# Patient Record
Sex: Female | Born: 1985
Health system: Southern US, Community
[De-identification: ages and names within clinical notes are randomized; demographics above are authoritative.]

## PROBLEM LIST (undated history)

## (undated) ENCOUNTER — Inpatient Hospital Stay: Payer: Self-pay

## (undated) DIAGNOSIS — G932 Benign intracranial hypertension: Secondary | ICD-10-CM

## (undated) DIAGNOSIS — S82409A Unspecified fracture of shaft of unspecified fibula, initial encounter for closed fracture: Secondary | ICD-10-CM

## (undated) DIAGNOSIS — R519 Headache, unspecified: Secondary | ICD-10-CM

## (undated) DIAGNOSIS — F32A Depression, unspecified: Secondary | ICD-10-CM

## (undated) DIAGNOSIS — Z803 Family history of malignant neoplasm of breast: Secondary | ICD-10-CM

## (undated) DIAGNOSIS — F329 Major depressive disorder, single episode, unspecified: Secondary | ICD-10-CM

## (undated) DIAGNOSIS — Z9889 Other specified postprocedural states: Secondary | ICD-10-CM

## (undated) DIAGNOSIS — Z1371 Encounter for nonprocreative screening for genetic disease carrier status: Secondary | ICD-10-CM

## (undated) DIAGNOSIS — R112 Nausea with vomiting, unspecified: Secondary | ICD-10-CM

## (undated) DIAGNOSIS — K219 Gastro-esophageal reflux disease without esophagitis: Secondary | ICD-10-CM

## (undated) DIAGNOSIS — N200 Calculus of kidney: Secondary | ICD-10-CM

## (undated) DIAGNOSIS — R51 Headache: Secondary | ICD-10-CM

## (undated) DIAGNOSIS — Z87442 Personal history of urinary calculi: Secondary | ICD-10-CM

## (undated) DIAGNOSIS — I10 Essential (primary) hypertension: Secondary | ICD-10-CM

## (undated) DIAGNOSIS — F419 Anxiety disorder, unspecified: Secondary | ICD-10-CM

## (undated) DIAGNOSIS — Z8489 Family history of other specified conditions: Secondary | ICD-10-CM

## (undated) DIAGNOSIS — M21372 Foot drop, left foot: Secondary | ICD-10-CM

## (undated) HISTORY — PX: ABDOMINAL HYSTERECTOMY: SHX81

## (undated) HISTORY — DX: Family history of malignant neoplasm of breast: Z80.3

## (undated) HISTORY — DX: Depression, unspecified: F32.A

## (undated) HISTORY — DX: Calculus of kidney: N20.0

## (undated) HISTORY — DX: Major depressive disorder, single episode, unspecified: F32.9

## (undated) HISTORY — DX: Unspecified fracture of shaft of unspecified fibula, initial encounter for closed fracture: S82.409A

## (undated) HISTORY — DX: Foot drop, left foot: M21.372

## (undated) HISTORY — PX: WISDOM TOOTH EXTRACTION: SHX21

## (undated) HISTORY — DX: Encounter for nonprocreative screening for genetic disease carrier status: Z13.71

## (undated) HISTORY — DX: Gastro-esophageal reflux disease without esophagitis: K21.9

---

## 2005-08-21 ENCOUNTER — Ambulatory Visit: Payer: Self-pay | Admitting: Neurology

## 2005-09-04 ENCOUNTER — Ambulatory Visit: Payer: Self-pay | Admitting: Neurology

## 2005-10-02 ENCOUNTER — Ambulatory Visit: Payer: Self-pay | Admitting: Radiology

## 2007-01-20 ENCOUNTER — Emergency Department: Payer: Self-pay | Admitting: Emergency Medicine

## 2007-12-17 DIAGNOSIS — G932 Benign intracranial hypertension: Secondary | ICD-10-CM

## 2007-12-17 HISTORY — DX: Benign intracranial hypertension: G93.2

## 2008-04-03 ENCOUNTER — Observation Stay: Payer: Self-pay | Admitting: Obstetrics & Gynecology

## 2008-05-31 ENCOUNTER — Inpatient Hospital Stay: Payer: Self-pay | Admitting: Obstetrics and Gynecology

## 2008-10-03 ENCOUNTER — Emergency Department: Payer: Self-pay | Admitting: Emergency Medicine

## 2008-11-03 ENCOUNTER — Ambulatory Visit: Payer: Self-pay | Admitting: Radiology

## 2008-11-03 ENCOUNTER — Ambulatory Visit: Payer: Self-pay | Admitting: Specialist

## 2013-01-23 ENCOUNTER — Ambulatory Visit: Payer: Self-pay | Admitting: General Practice

## 2014-02-24 ENCOUNTER — Ambulatory Visit: Payer: Self-pay | Admitting: General Practice

## 2014-09-20 ENCOUNTER — Other Ambulatory Visit (HOSPITAL_COMMUNITY): Payer: Self-pay | Admitting: Neurosurgery

## 2014-09-20 ENCOUNTER — Encounter (HOSPITAL_COMMUNITY): Payer: Self-pay | Admitting: Pharmacy Technician

## 2014-09-21 ENCOUNTER — Encounter (HOSPITAL_COMMUNITY): Payer: Self-pay | Admitting: *Deleted

## 2014-09-21 MED ORDER — CEFAZOLIN SODIUM-DEXTROSE 2-3 GM-% IV SOLR
2.0000 g | INTRAVENOUS | Status: AC
  Administered 2014-09-22: 2 g via INTRAVENOUS
  Filled 2014-09-21: qty 50

## 2014-09-22 ENCOUNTER — Observation Stay (HOSPITAL_COMMUNITY)
Admission: RE | Admit: 2014-09-22 | Discharge: 2014-09-22 | Disposition: A | Discharge status: Home / Self Care | Payer: No Typology Code available for payment source | Source: Ambulatory Visit | Attending: Neurosurgery | Admitting: Neurosurgery

## 2014-09-22 ENCOUNTER — Ambulatory Visit (HOSPITAL_COMMUNITY): Payer: No Typology Code available for payment source | Admitting: Certified Registered"

## 2014-09-22 ENCOUNTER — Encounter (HOSPITAL_COMMUNITY): Payer: No Typology Code available for payment source | Admitting: Certified Registered"

## 2014-09-22 ENCOUNTER — Encounter (HOSPITAL_COMMUNITY): Payer: Self-pay | Admitting: Certified Registered"

## 2014-09-22 ENCOUNTER — Ambulatory Visit (HOSPITAL_COMMUNITY): Payer: No Typology Code available for payment source

## 2014-09-22 ENCOUNTER — Encounter (HOSPITAL_COMMUNITY)
Admission: RE | Disposition: A | Discharge status: Home / Self Care | Payer: Self-pay | Source: Ambulatory Visit | Attending: Neurosurgery

## 2014-09-22 DIAGNOSIS — F17210 Nicotine dependence, cigarettes, uncomplicated: Secondary | ICD-10-CM | POA: Diagnosis not present

## 2014-09-22 DIAGNOSIS — I10 Essential (primary) hypertension: Secondary | ICD-10-CM | POA: Insufficient documentation

## 2014-09-22 DIAGNOSIS — M5126 Other intervertebral disc displacement, lumbar region: Secondary | ICD-10-CM | POA: Diagnosis present

## 2014-09-22 DIAGNOSIS — Z79899 Other long term (current) drug therapy: Secondary | ICD-10-CM | POA: Diagnosis not present

## 2014-09-22 DIAGNOSIS — F419 Anxiety disorder, unspecified: Secondary | ICD-10-CM | POA: Diagnosis not present

## 2014-09-22 DIAGNOSIS — M21372 Foot drop, left foot: Secondary | ICD-10-CM | POA: Insufficient documentation

## 2014-09-22 HISTORY — DX: Essential (primary) hypertension: I10

## 2014-09-22 HISTORY — DX: Headache, unspecified: R51.9

## 2014-09-22 HISTORY — PX: LUMBAR LAMINECTOMY/DECOMPRESSION MICRODISCECTOMY: SHX5026

## 2014-09-22 HISTORY — DX: Other intervertebral disc displacement, lumbar region: M51.26

## 2014-09-22 HISTORY — DX: Anxiety disorder, unspecified: F41.9

## 2014-09-22 HISTORY — DX: Family history of other specified conditions: Z84.89

## 2014-09-22 HISTORY — DX: Headache: R51

## 2014-09-22 LAB — HCG, SERUM, QUALITATIVE: Preg, Serum: NEGATIVE

## 2014-09-22 LAB — CBC
HCT: 40.9 % (ref 36.0–46.0)
Hemoglobin: 14.1 g/dL (ref 12.0–15.0)
MCH: 29.4 pg (ref 26.0–34.0)
MCHC: 34.5 g/dL (ref 30.0–36.0)
MCV: 85.2 fL (ref 78.0–100.0)
Platelets: 342 10*3/uL (ref 150–400)
RBC: 4.8 MIL/uL (ref 3.87–5.11)
RDW: 12.3 % (ref 11.5–15.5)
WBC: 9.1 10*3/uL (ref 4.0–10.5)

## 2014-09-22 LAB — BASIC METABOLIC PANEL
Anion gap: 14 (ref 5–15)
BUN: 8 mg/dL (ref 6–23)
CO2: 24 mEq/L (ref 19–32)
Calcium: 9.1 mg/dL (ref 8.4–10.5)
Chloride: 99 mEq/L (ref 96–112)
Creatinine, Ser: 0.85 mg/dL (ref 0.50–1.10)
GFR calc Af Amer: >90 mL/min (ref >90)
GFR calc non Af Amer: >90 mL/min (ref >90)
Glucose, Bld: 112 mg/dL — ABNORMAL HIGH (ref 70–99)
Potassium: 4.2 mEq/L (ref 3.7–5.3)
Sodium: 137 mEq/L (ref 137–147)

## 2014-09-22 LAB — SURGICAL PCR SCREEN
MRSA, PCR: NEGATIVE
Staphylococcus aureus: NEGATIVE

## 2014-09-22 SURGERY — LUMBAR LAMINECTOMY/DECOMPRESSION MICRODISCECTOMY 1 LEVEL
Anesthesia: General | Site: Back | Laterality: Left

## 2014-09-22 MED ORDER — METHYLPREDNISOLONE ACETATE 80 MG/ML IJ SUSP
INTRAMUSCULAR | Status: DC | PRN
  Administered 2014-09-22: 80 mg

## 2014-09-22 MED ORDER — GLYCOPYRROLATE 0.2 MG/ML IJ SOLN
INTRAMUSCULAR | Status: DC | PRN
  Administered 2014-09-22: .8 mg via INTRAVENOUS

## 2014-09-22 MED ORDER — ROCURONIUM BROMIDE 100 MG/10ML IV SOLN
INTRAVENOUS | Status: DC | PRN
  Administered 2014-09-22: 50 mg via INTRAVENOUS

## 2014-09-22 MED ORDER — OXYCODONE-ACETAMINOPHEN 10-325 MG PO TABS: 1.0000 | ORAL_TABLET | ORAL | Status: DC | PRN

## 2014-09-22 MED ORDER — MIDAZOLAM HCL 2 MG/2ML IJ SOLN
INTRAMUSCULAR | Status: AC
  Filled 2014-09-22: qty 2

## 2014-09-22 MED ORDER — MUPIROCIN 2 % EX OINT
TOPICAL_OINTMENT | CUTANEOUS | Status: AC
  Administered 2014-09-22 (×2)
  Filled 2014-09-22: qty 22

## 2014-09-22 MED ORDER — METOCLOPRAMIDE HCL 5 MG/ML IJ SOLN
10.0000 mg | Freq: Once | INTRAMUSCULAR | Status: AC | PRN
  Administered 2014-09-22: 10 mg via INTRAVENOUS

## 2014-09-22 MED ORDER — PROPOFOL 10 MG/ML IV BOLUS
INTRAVENOUS | Status: AC
  Filled 2014-09-22: qty 20

## 2014-09-22 MED ORDER — DIAZEPAM 5 MG PO TABS: 5.0000 mg | ORAL_TABLET | Freq: Four times a day (QID) | ORAL | Status: DC | PRN

## 2014-09-22 MED ORDER — 0.9 % SODIUM CHLORIDE (POUR BTL) OPTIME
TOPICAL | Status: DC | PRN
  Administered 2014-09-22: 1000 mL

## 2014-09-22 MED ORDER — ONDANSETRON HCL 4 MG/2ML IJ SOLN
INTRAMUSCULAR | Status: AC
  Administered 2014-09-22: 4 mg
  Filled 2014-09-22: qty 2

## 2014-09-22 MED ORDER — MIDAZOLAM BOLUS VIA INFUSION
1.0000 mg | Freq: Once | INTRAVENOUS | Status: AC
  Administered 2014-09-22: 1 mg via INTRAVENOUS

## 2014-09-22 MED ORDER — THROMBIN 5000 UNITS EX SOLR
OROMUCOSAL | Status: DC | PRN
  Administered 2014-09-22: 16:00:00 via TOPICAL

## 2014-09-22 MED ORDER — DEXAMETHASONE SODIUM PHOSPHATE 4 MG/ML IJ SOLN
INTRAMUSCULAR | Status: DC | PRN
  Administered 2014-09-22: 4 mg via INTRAVENOUS

## 2014-09-22 MED ORDER — THROMBIN 5000 UNITS EX SOLR
CUTANEOUS | Status: DC | PRN
  Administered 2014-09-22 (×2): 5000 [IU] via TOPICAL

## 2014-09-22 MED ORDER — ONDANSETRON HCL 4 MG/2ML IJ SOLN: 4.0000 mg | Freq: Once | INTRAMUSCULAR | Status: DC | PRN

## 2014-09-22 MED ORDER — MIDAZOLAM HCL 5 MG/5ML IJ SOLN
INTRAMUSCULAR | Status: DC | PRN
  Administered 2014-09-22: 2 mg via INTRAVENOUS

## 2014-09-22 MED ORDER — LIDOCAINE HCL (CARDIAC) 20 MG/ML IV SOLN
INTRAVENOUS | Status: DC | PRN
  Administered 2014-09-22: 30 mg via INTRAVENOUS

## 2014-09-22 MED ORDER — OXYCODONE HCL 5 MG/5ML PO SOLN: 5.0000 mg | Freq: Once | ORAL | Status: DC | PRN

## 2014-09-22 MED ORDER — HEMOSTATIC AGENTS (NO CHARGE) OPTIME
TOPICAL | Status: DC | PRN
  Administered 2014-09-22: 1 via TOPICAL

## 2014-09-22 MED ORDER — ACETAMINOPHEN 10 MG/ML IV SOLN
INTRAVENOUS | Status: AC
  Administered 2014-09-22: 1000 mg via INTRAVENOUS
  Filled 2014-09-22: qty 100

## 2014-09-22 MED ORDER — OXYCODONE HCL 5 MG PO TABS: 5.0000 mg | ORAL_TABLET | Freq: Once | ORAL | Status: DC | PRN

## 2014-09-22 MED ORDER — ONDANSETRON HCL 4 MG/2ML IJ SOLN
INTRAMUSCULAR | Status: DC | PRN
  Administered 2014-09-22: 4 mg via INTRAVENOUS

## 2014-09-22 MED ORDER — SODIUM CHLORIDE 0.9 % IV SOLN: INTRAVENOUS | Status: DC

## 2014-09-22 MED ORDER — SODIUM CHLORIDE 0.9 % IR SOLN
Status: DC | PRN
  Administered 2014-09-22: 16:00:00

## 2014-09-22 MED ORDER — ROCURONIUM BROMIDE 50 MG/5ML IV SOLN
INTRAVENOUS | Status: AC
  Filled 2014-09-22: qty 1

## 2014-09-22 MED ORDER — LIDOCAINE-EPINEPHRINE 1 %-1:100000 IJ SOLN
INTRAMUSCULAR | Status: DC | PRN
  Administered 2014-09-22: 4.5 mL

## 2014-09-22 MED ORDER — LIDOCAINE HCL (CARDIAC) 20 MG/ML IV SOLN
INTRAVENOUS | Status: AC
  Filled 2014-09-22: qty 5

## 2014-09-22 MED ORDER — ONDANSETRON HCL 4 MG/2ML IJ SOLN
4.0000 mg | INTRAMUSCULAR | Status: DC | PRN
  Filled 2014-09-22: qty 2

## 2014-09-22 MED ORDER — HYDROMORPHONE HCL 1 MG/ML IJ SOLN
INTRAMUSCULAR | Status: AC
  Filled 2014-09-22: qty 1

## 2014-09-22 MED ORDER — SUFENTANIL CITRATE 50 MCG/ML IV SOLN
INTRAVENOUS | Status: AC
  Filled 2014-09-22: qty 1

## 2014-09-22 MED ORDER — SUFENTANIL CITRATE 50 MCG/ML IV SOLN
INTRAVENOUS | Status: DC | PRN
  Administered 2014-09-22: 10 ug via INTRAVENOUS
  Administered 2014-09-22: 20 ug via INTRAVENOUS

## 2014-09-22 MED ORDER — HYDROMORPHONE HCL 1 MG/ML IJ SOLN
0.2500 mg | INTRAMUSCULAR | Status: DC | PRN
  Administered 2014-09-22: 0.5 mg via INTRAVENOUS

## 2014-09-22 MED ORDER — BUPIVACAINE HCL (PF) 0.5 % IJ SOLN
INTRAMUSCULAR | Status: DC | PRN
  Administered 2014-09-22: 4.5 mL

## 2014-09-22 MED ORDER — METOCLOPRAMIDE HCL 5 MG/ML IJ SOLN
INTRAMUSCULAR | Status: AC
  Filled 2014-09-22: qty 2

## 2014-09-22 MED ORDER — PROPOFOL 10 MG/ML IV BOLUS
INTRAVENOUS | Status: DC | PRN
  Administered 2014-09-22: 190 mg via INTRAVENOUS

## 2014-09-22 MED ORDER — NEOSTIGMINE METHYLSULFATE 10 MG/10ML IV SOLN
INTRAVENOUS | Status: DC | PRN
  Administered 2014-09-22: 5 mg via INTRAVENOUS

## 2014-09-22 MED ORDER — LACTATED RINGERS IV SOLN
INTRAVENOUS | Status: DC
  Administered 2014-09-22 (×2): via INTRAVENOUS

## 2014-09-22 SURGICAL SUPPLY — 63 items
BAG DECANTER FOR FLEXI CONT (MISCELLANEOUS) ×3 IMPLANT
BENZOIN TINCTURE PRP APPL 2/3 (GAUZE/BANDAGES/DRESSINGS) IMPLANT
BLADE CLIPPER SURG (BLADE) IMPLANT
BLADE SURG 11 STRL SS (BLADE) ×3 IMPLANT
BUR MATCHSTICK NEURO 3.0 LAGG (BURR) ×3 IMPLANT
CANISTER SUCT 3000ML (MISCELLANEOUS) ×3 IMPLANT
CLOSURE WOUND 1/2 X4 (GAUZE/BANDAGES/DRESSINGS)
CONT SPEC 4OZ CLIKSEAL STRL BL (MISCELLANEOUS) ×3 IMPLANT
DECANTER SPIKE VIAL GLASS SM (MISCELLANEOUS) ×3 IMPLANT
DERMABOND ADHESIVE PROPEN (GAUZE/BANDAGES/DRESSINGS) ×2
DERMABOND ADVANCED (GAUZE/BANDAGES/DRESSINGS)
DERMABOND ADVANCED .7 DNX12 (GAUZE/BANDAGES/DRESSINGS) IMPLANT
DERMABOND ADVANCED .7 DNX6 (GAUZE/BANDAGES/DRESSINGS) ×1 IMPLANT
DRAPE LAPAROTOMY 100X72X124 (DRAPES) ×3 IMPLANT
DRAPE MICROSCOPE LEICA (MISCELLANEOUS) ×3 IMPLANT
DRAPE POUCH INSTRU U-SHP 10X18 (DRAPES) ×3 IMPLANT
DRAPE SURG 17X23 STRL (DRAPES) ×3 IMPLANT
DRSG OPSITE POSTOP 3X4 (GAUZE/BANDAGES/DRESSINGS) ×3 IMPLANT
DURAPREP 26ML APPLICATOR (WOUND CARE) ×3 IMPLANT
ELECT REM PT RETURN 9FT ADLT (ELECTROSURGICAL) ×3
ELECTRODE REM PT RTRN 9FT ADLT (ELECTROSURGICAL) ×1 IMPLANT
GAUZE SPONGE 4X4 12PLY STRL (GAUZE/BANDAGES/DRESSINGS) IMPLANT
GAUZE SPONGE 4X4 16PLY XRAY LF (GAUZE/BANDAGES/DRESSINGS) IMPLANT
GLOVE BIOGEL PI IND STRL 7.5 (GLOVE) ×1 IMPLANT
GLOVE BIOGEL PI IND STRL 8 (GLOVE) ×2 IMPLANT
GLOVE BIOGEL PI IND STRL 8.5 (GLOVE) ×1 IMPLANT
GLOVE BIOGEL PI INDICATOR 7.5 (GLOVE) ×2
GLOVE BIOGEL PI INDICATOR 8 (GLOVE) ×4
GLOVE BIOGEL PI INDICATOR 8.5 (GLOVE) ×2
GLOVE ECLIPSE 7.0 STRL STRAW (GLOVE) ×3 IMPLANT
GLOVE ECLIPSE 7.5 STRL STRAW (GLOVE) ×6 IMPLANT
GLOVE ECLIPSE 8.5 STRL (GLOVE) ×3 IMPLANT
GLOVE EXAM NITRILE LRG STRL (GLOVE) IMPLANT
GLOVE EXAM NITRILE MD LF STRL (GLOVE) IMPLANT
GLOVE EXAM NITRILE XL STR (GLOVE) IMPLANT
GLOVE EXAM NITRILE XS STR PU (GLOVE) IMPLANT
GOWN STRL REUS W/ TWL LRG LVL3 (GOWN DISPOSABLE) ×2 IMPLANT
GOWN STRL REUS W/ TWL XL LVL3 (GOWN DISPOSABLE) IMPLANT
GOWN STRL REUS W/TWL 2XL LVL3 (GOWN DISPOSABLE) ×6 IMPLANT
GOWN STRL REUS W/TWL LRG LVL3 (GOWN DISPOSABLE) ×4
GOWN STRL REUS W/TWL XL LVL3 (GOWN DISPOSABLE)
HEMOSTAT POWDER KIT SURGIFOAM (HEMOSTASIS) ×3 IMPLANT
KIT BASIN OR (CUSTOM PROCEDURE TRAY) ×3 IMPLANT
KIT ROOM TURNOVER OR (KITS) ×3 IMPLANT
NEEDLE HYPO 18GX1.5 BLUNT FILL (NEEDLE) ×3 IMPLANT
NEEDLE HYPO 25X1 1.5 SAFETY (NEEDLE) ×3 IMPLANT
NEEDLE SPNL 18GX3.5 QUINCKE PK (NEEDLE) ×3 IMPLANT
NS IRRIG 1000ML POUR BTL (IV SOLUTION) ×3 IMPLANT
PACK LAMINECTOMY NEURO (CUSTOM PROCEDURE TRAY) ×3 IMPLANT
PAD ARMBOARD 7.5X6 YLW CONV (MISCELLANEOUS) ×9 IMPLANT
RUBBERBAND STERILE (MISCELLANEOUS) ×6 IMPLANT
SPONGE LAP 4X18 X RAY DECT (DISPOSABLE) IMPLANT
SPONGE SURGIFOAM ABS GEL SZ50 (HEMOSTASIS) ×3 IMPLANT
STRIP CLOSURE SKIN 1/2X4 (GAUZE/BANDAGES/DRESSINGS) IMPLANT
SUT VIC AB 0 CT1 18XCR BRD8 (SUTURE) ×1 IMPLANT
SUT VIC AB 0 CT1 8-18 (SUTURE) ×2
SUT VIC AB 2-0 CT1 18 (SUTURE) IMPLANT
SUT VICRYL 3-0 RB1 18 ABS (SUTURE) ×3 IMPLANT
SYR 20ML ECCENTRIC (SYRINGE) ×3 IMPLANT
SYR 3ML LL SCALE MARK (SYRINGE) ×3 IMPLANT
TOWEL OR 17X24 6PK STRL BLUE (TOWEL DISPOSABLE) ×3 IMPLANT
TOWEL OR 17X26 10 PK STRL BLUE (TOWEL DISPOSABLE) ×3 IMPLANT
WATER STERILE IRR 1000ML POUR (IV SOLUTION) ×3 IMPLANT

## 2014-09-22 NOTE — Op Note (Signed)
PREOP DIAGNOSIS: Lumbar disc herniation, left L4-5  POSTOP DIAGNOSIS: Same  PROCEDURE: 1. Left L4-5 laminotomy and microdiscectomy for decompression of nerve root 2. Use of operating microscope  SURGEON: Dr. Consuella Lose, MD  ASSISTANT: Dr. Kristeen Miss, MD  ANESTHESIA: General Endotracheal  EBL: 50cc  SPECIMENS: None  DRAINS: None  COMPLICATIONS: None immediate  CONDITION: Hemodynamically stable to PACU  HISTORY: Debbie Townsend is a 28 y.o. female initially seen in the outpatient clinic with a left L5 radiculopathy and a large L45 disc herniation. After she failed conservative management, she elected to proceed with surgical decompression. A few days prior to surgery she developed a foot drop. The risks and benefits of surgery were explained in detail. After all questions were answered, informed consent was obtained.  PROCEDURE IN DETAIL: After informed consent was obtained and witnessed, the patient was brought to the operating room. After induction of general anesthesia, the patient was positioned on the operative table in the prone position with all pressure points meticulously padded. The skin of the low back was then prepped and draped in the usual sterile fashion.  Under Xray, the correct level was identified and marked out on the skin, and after timeout was conducted, the skin was infiltrated with local anesthetic. Skin incision was then made sharply and Bovie electrocautery was used to dissect the subcutaneous tissue until the lumbodorsal fascia was identified. The fascia was then incised using Bovie electrocautery and the lamina at the left L4 and L5 levels was identified and dissection was carried out in the subperiosteal plane. Self-retaining retractor was then placed, and intraoperative x-ray was taken to confirm we were at the correct level, L4-5.  Using a high-speed drill, laminotomy was completed with a partial medial facetectomy. The ligamentum flavum was then  identified and removed and the lateral edge of the thecal sac was identified. This was then traced down to identify the traversing nerve root. Dissection was then carried out superior and lateral to the nerve root to identify the disc herniation. There was a fairly large disc herniation compressing the lateral edge of the thecal sac and the left L5 root at its entrance into the foramen. The posterior annulus was incised and using a combination of dissectors, curettes, and rongeurs, the herniated disc fragments were removed. The decompression of the nerve root was confirmed using a dissector.  Hemostasis was then secured using a combination of morcellized Gelfoam and thrombin and bipolar electrocautery. The wound is irrigated with copious amounts of antibiotic saline irrigation. The nerve root was then covered with a long-acting steroid solution. Self-retaining retractor was then removed, and the wound is closed in layers using a combination of interrupted 0 Vicryl and 3-0 Vicryl stitches. The skin was closed using standard skin glue.  At the end of the case all sponge, needle, and instrument counts were correct. The patient was then transferred to the stretcher and taken to the postanesthesia care unit in stable hemodynamic condition.

## 2014-09-22 NOTE — H&P (Addendum)
CC:  Left foot drop  HPI: Debbie Townsend is a 28 year old woman seen for initial consultation in the office. She comes in complaining of a approximately six-month history of primarily left leg pain. She says the pain began without any inciting events. She describes it as a burning-type pain which she experiences in the left buttock, the back of her left thigh, and the outside of her left calf. She gets pain in these 3 different locations although not always at the same time. She denies any right leg symptoms or bladder changes. She does say that she feels like her left foot is a little bit different when she is walking, although she does continue to ambulate fairly normally. She has been to a chiropractor for a chronic history of back problems, however she has not been getting any significant relief from the manipulations for this left leg pain. The only therapy she has tried so far is over-the-counter Motrin which provides spotty intermittent relief.  She did not wish to undergo surgical decompression and was referred for epidural steroid injections. She has undergone 3 of these injections, and states that after the first when she did get fairly significant left-sided pain relief, however the second 2 did not provide any significant relief. The pain is progressively worsened, and she feels like she is unable to work at this point. She continues to have severe left-sided leg pain in the back of her leg and the lateral aspect of her left calf.  She was initially scheduled for a left-sided L4-5 microdiscectomy 2 days ago. When she was seen in the preoperative area at the outpatient surgical Center, she said that over the past week and approximately 3-4 days prior she developed a complete left-sided footdrop. She said she was dragging her foot, and she was unable to pick her foot up off the ground, although she could push her toes down. She also said that at the same time, the pain in her leg got significantly  better. In addition, several days before the week and started, she was feeling very constipated and ended up getting herself multiple enemas and taking several suppositories. When she was seen in the preop area, she said she continued to have fairly severe diarrhea, and had had multiple accidents on herself because she was unable to get out of bed in time to make it to the bathroom. Because of that,  I did review with her the increased risk of wound infection, and we made the decision to allow the diarrhea to get better before proceeding with microdiscectomy. Today she says the diarrhea has significantly improved and she has not had any accidents on herself.   PMH: Past Medical History  Diagnosis Date  . Family history of anesthesia complication      mother had Post -op nausea and dizziness.  . Hypertension   . Headache   . Anxiety   . Kidney stones     PSH: Past Surgical History  Procedure Laterality Date  . Cesarean section      SH: History  Substance Use Topics  . Smoking status: Current Some Day Smoker    Types: Cigarettes  . Smokeless tobacco: Never Used  . Alcohol Use: Yes     Comment: " occasionally"    MEDS: Prior to Admission medications   Medication Sig Start Date End Date Taking? Authorizing Provider  lisinopril (PRINIVIL,ZESTRIL) 20 MG tablet Take 20 mg by mouth daily.   Yes Historical Provider, MD  traZODone (DESYREL) 150 MG tablet  Take 150 mg by mouth at bedtime.   Yes Historical Provider, MD    ALLERGY: Allergies  Allergen Reactions  . Apple Other (See Comments)    Lips itch and swell, inside of mouth gets hives    ROS: ROS  NEUROLOGIC EXAM: Awake, alert, oriented Memory and concentration grossly intact Speech fluent, appropriate CN grossly intact Motor exam: Upper Extremities Deltoid Bicep Tricep Grip  Right 5/5 5/5 5/5 5/5  Left 5/5 5/5 5/5 5/5   Lower Extremity IP Quad PF DF EHL  Right 5/5 5/5 5/5 5/5 5/5  Left 5/5 5/5 5/5 0/5 05    Sensation grossly intact to LT  [facility]: MRI of the lumbar spine demonstrates some disc desiccation at L2-3 and L3 L4. At L4 L5 there is also disc desiccation with a fairly large left eccentric disc herniation with likely compression of the traversing left L5 nerve root.   IMPRESSION: 28 year old woman with left L5 radiculopathy and foot-drop, and a large left-sided L4 L5 disc herniation who has failed conservative measures.  PLAN: surgical decompression via left L4 L5 laminotomy and microdiscectomy  The radiographic findings and general treatment options were discussed in detail with the patient. Given her symtpoms despite conservative therapies, I recommended the above surgical decompression. The risks of the surgery were discussed in detail, including but not limited to bleeding, infection, nerve injury resulting in leg/foot weakness or bowel/bladder dysfunction, and CSF leak. Possible outcomes of surgery were also discussed including the possibility of uncomplicated surgery but persistence of symptoms.   The patient understood our discussion and is willing to proceed with surgery. All her questions were answered.

## 2014-09-22 NOTE — Anesthesia Postprocedure Evaluation (Signed)
  Anesthesia Post-op Note  Patient: Debbie Townsend  Procedure(s) Performed: Procedure(s) with comments: Left Lumbar four-five microdiskectomy (Left) - Left Lumbar four-five microdiskectomy  Patient Location: PACU  Anesthesia Type: General   Level of Consciousness: awake, alert  and oriented  Airway and Oxygen Therapy: Patient Spontanous Breathing  Post-op Pain: mild  Post-op Assessment: Post-op Vital signs reviewed  Post-op Vital Signs: Reviewed  Last Vitals:  Filed Vitals:   09/22/14 1646  BP: 127/69  Pulse: 97  Temp:   Resp: 24    Complications: No apparent anesthesia complications

## 2014-09-22 NOTE — Progress Notes (Signed)
Patient refused to be transferred to [facility].  Wants to go home from PACU.  Patient is fully awake. Neuro status intact. Dr. Kathyrn Sheriff notified. Cleared for discharge in PACU.

## 2014-09-22 NOTE — Discharge Summary (Signed)
  Physician Discharge Summary  Patient ID: Debbie Townsend MRN: [id number] DOB/AGE: May 06, 1986 28 y.o.  Admit date: 09/22/2014 Discharge date: 09/22/2014  Admission Diagnoses: Lumbar disc herniation with radiculopathy, left L4-5  Discharge Diagnoses: Same Active Problems:   * No active hospital problems. *   Discharged Condition: Stable  Hospital Course:  Mrs. Debbie Townsend is a 28 y.o. female who presented to the clinic with left L5 radiculopathy and MRI demonstrating left L4-5 disc herniation. The patient was admitted for elective left L4-5 laminotomy and microdiscectomy which was done without complication. Postoperatively, the patient was at her neurologic baseline. Back pain was controlled with oral medication, she was ambulating without difficulty, voiding normally, and tolerating diet.  Treatments: Surgery - Left L4-5 laminotomy, microdiscectomy  Discharge Exam: Blood pressure 128/83, pulse 96, temperature 98.8 F (37.1 C), temperature source Oral, resp. rate 20, height 5\' 6"  (1.676 m), weight 111.131 kg (245 lb), last menstrual period 08/30/2014, SpO2 98.00%. Awake, alert, oriented Speech fluent, appropriate CN grossly intact 5/5 BUE/BLE x complete left foot drop Wound c/d/i  Follow-up: Follow-up in my office [facility] [phone]) in 2-3 weeks  Disposition: home     Medication List         diazepam 5 MG tablet  Commonly known as:  VALIUM  Take 1 tablet (5 mg total) by mouth every 6 (six) hours as needed for anxiety.     lisinopril 20 MG tablet  Commonly known as:  PRINIVIL,ZESTRIL  Take 20 mg by mouth daily.     oxyCODONE-acetaminophen 10-325 MG per tablet  Commonly known as:  PERCOCET  Take 1 tablet by mouth every 4 (four) hours as needed for pain.     traZODone 150 MG tablet  Commonly known as:  DESYREL  Take 150 mg by mouth at bedtime.           Follow-up Information   Follow up with Jairo Ben, MD.   Specialty:  Neurosurgery   Contact information:   [address] [place] [id number] [phone]       Signed: Consuella Lose, Loletha Grayer 09/22/2014, 4:23 PM

## 2014-09-22 NOTE — Anesthesia Procedure Notes (Signed)
Procedure Name: Intubation Date/Time: 09/22/2014 2:45 PM Performed by: Melina Copa, Hillary Schwegler R Pre-anesthesia Checklist: Patient identified, Emergency Drugs available, Suction available, Patient being monitored and Timeout performed Patient Re-evaluated:Patient Re-evaluated prior to inductionOxygen Delivery Method: Circle system utilized Preoxygenation: Pre-oxygenation with 100% oxygen Intubation Type: IV induction Ventilation: Mask ventilation without difficulty Laryngoscope Size: Mac and 3 Grade View: Grade I Tube type: Oral Tube size: 7.5 mm Airway Equipment and Method: Stylet Placement Confirmation: ETT inserted through vocal cords under direct vision,  positive ETCO2 and breath sounds checked- equal and bilateral Secured at: 21 cm Tube secured with: Tape Dental Injury: Teeth and Oropharynx as per pre-operative assessment

## 2014-09-22 NOTE — Anesthesia Preprocedure Evaluation (Addendum)
Anesthesia Evaluation  Patient identified by MRN, date of birth, ID band Patient awake    Reviewed: Allergy & Precautions, H&P , NPO status , Patient's Chart, lab work & pertinent test results  History of Anesthesia Complications (+) Family history of anesthesia reaction  Airway Mallampati: II TM Distance: >3 FB Neck ROM: Full    Dental  (+) Teeth Intact, Dental Advisory Given   Pulmonary Current Smoker,  breath sounds clear to auscultation        Cardiovascular hypertension, Rhythm:Regular Rate:Normal     Neuro/Psych    GI/Hepatic   Endo/Other    Renal/GU      Musculoskeletal   Abdominal (+) + obese,   Peds  Hematology   Anesthesia Other Findings   Reproductive/Obstetrics                          Anesthesia Physical Anesthesia Plan  ASA: II  Anesthesia Plan: General   Post-op Pain Management:    Induction: Intravenous  Airway Management Planned: Oral ETT  Additional Equipment: None  Intra-op Plan:   Post-operative Plan: Extubation in OR  Informed Consent: I have reviewed the patients History and Physical, chart, labs and discussed the procedure including the risks, benefits and alternatives for the proposed anesthesia with the patient or authorized representative who has indicated his/her understanding and acceptance.   Dental advisory given  Plan Discussed with: CRNA, Anesthesiologist and Surgeon  Anesthesia Plan Comments:         Anesthesia Quick Evaluation

## 2014-09-22 NOTE — Discharge Instructions (Signed)

## 2014-09-22 NOTE — Transfer of Care (Signed)
Immediate Anesthesia Transfer of Care Note  Patient: Debbie Townsend  Procedure(s) Performed: Procedure(s) with comments: Left Lumbar four-five microdiskectomy (Left) - Left Lumbar four-five microdiskectomy  Patient Location: PACU  Anesthesia Type:General  Level of Consciousness: awake, alert  and oriented  Airway & Oxygen Therapy: Patient Spontanous Breathing and Patient connected to face mask oxygen  Post-op Assessment: Report given to PACU RN, Post -op Vital signs reviewed and stable and Patient moving all extremities X 4  Post vital signs: Reviewed and stable  Complications: No apparent anesthesia complications

## 2014-09-23 ENCOUNTER — Encounter (HOSPITAL_COMMUNITY): Payer: Self-pay | Admitting: Neurosurgery

## 2014-11-28 ENCOUNTER — Ambulatory Visit: Payer: Self-pay | Admitting: General Practice

## 2015-04-17 ENCOUNTER — Ambulatory Visit
Admission: RE | Admit: 2015-04-17 | Discharge: 2015-04-17 | Disposition: A | Discharge status: Home / Self Care | Payer: No Typology Code available for payment source | Source: Ambulatory Visit | Attending: General Practice | Admitting: General Practice

## 2015-04-17 ENCOUNTER — Other Ambulatory Visit: Payer: Self-pay | Admitting: General Practice

## 2015-04-17 DIAGNOSIS — R059 Cough, unspecified: Secondary | ICD-10-CM

## 2015-04-17 DIAGNOSIS — R062 Wheezing: Secondary | ICD-10-CM | POA: Insufficient documentation

## 2015-04-17 DIAGNOSIS — R05 Cough: Secondary | ICD-10-CM

## 2015-10-30 ENCOUNTER — Other Ambulatory Visit: Payer: Self-pay | Admitting: Certified Nurse Midwife

## 2015-10-30 DIAGNOSIS — O4692 Antepartum hemorrhage, unspecified, second trimester: Secondary | ICD-10-CM

## 2015-10-30 DIAGNOSIS — E669 Obesity, unspecified: Secondary | ICD-10-CM

## 2015-11-13 ENCOUNTER — Ambulatory Visit (HOSPITAL_BASED_OUTPATIENT_CLINIC_OR_DEPARTMENT_OTHER)
Admission: RE | Admit: 2015-11-13 | Discharge: 2015-11-13 | Disposition: A | Discharge status: Home / Self Care | Payer: No Typology Code available for payment source | Source: Ambulatory Visit | Attending: Certified Nurse Midwife | Admitting: Certified Nurse Midwife

## 2015-11-13 ENCOUNTER — Ambulatory Visit
Admission: RE | Admit: 2015-11-13 | Discharge: 2015-11-13 | Disposition: A | Discharge status: Home / Self Care | Payer: No Typology Code available for payment source | Source: Ambulatory Visit | Attending: Obstetrics & Gynecology | Admitting: Obstetrics & Gynecology

## 2015-11-13 VITALS — BP 148/87 | HR 81 | Temp 97.7°F | Wt 251.0 lb

## 2015-11-13 DIAGNOSIS — G932 Benign intracranial hypertension: Secondary | ICD-10-CM | POA: Insufficient documentation

## 2015-11-13 DIAGNOSIS — Z3A18 18 weeks gestation of pregnancy: Secondary | ICD-10-CM | POA: Diagnosis not present

## 2015-11-13 DIAGNOSIS — O10912 Unspecified pre-existing hypertension complicating pregnancy, second trimester: Secondary | ICD-10-CM

## 2015-11-13 DIAGNOSIS — O99212 Obesity complicating pregnancy, second trimester: Secondary | ICD-10-CM

## 2015-11-13 DIAGNOSIS — O4102X0 Oligohydramnios, second trimester, not applicable or unspecified: Secondary | ICD-10-CM

## 2015-11-13 DIAGNOSIS — O4100X0 Oligohydramnios, unspecified trimester, not applicable or unspecified: Secondary | ICD-10-CM | POA: Insufficient documentation

## 2015-11-13 DIAGNOSIS — E669 Obesity, unspecified: Secondary | ICD-10-CM

## 2015-11-13 DIAGNOSIS — O4692 Antepartum hemorrhage, unspecified, second trimester: Secondary | ICD-10-CM

## 2015-11-13 DIAGNOSIS — I10 Essential (primary) hypertension: Secondary | ICD-10-CM | POA: Insufficient documentation

## 2015-11-13 HISTORY — DX: Essential (primary) hypertension: I10

## 2015-11-13 HISTORY — DX: Benign intracranial hypertension: G93.2

## 2015-11-13 NOTE — Progress Notes (Addendum)
Maternal-Fetal Medicine Consultation: Debbie Townsend is a 29 year-old G2 P1001 at 35 3/7 weeks referred by Debbie Townsend for chronic hypertension, history of pseudotumor cerebri, elevated BMI and subjective low-normal fluid on ultrasound. In addition, she had about 2 weeks of vaginal bleeding during the pregnancy from 13-14 weeks' gestation.    She was diagnosed with chronic hypertension about one year after the delivery of her daughter. She denies end-organ dysfunction. She was on lisinopril but switch to aldomet early in the first trimester.    She has a history of pseudotumor cerebri but had not required LPs or medical management for over 4 years. She used diamox during her 29 last pregnancy. She denies headache or other neurologic symptoms. She has anxiety that she controls with xanax as needed but has not taken any benzos since becoming pregnancy.  Seen today with her mother, daughter and father-of-the-baby  PMH: Chronic hypertension, pseudotumor cerebri, anxiety, B12 deficiency PSH: Back surgery and Cesarean PObH: G2 P1. Full-term cesarean delivery for non-reassuring fetal status in labor. Debbie Townsend/Debbie Townsend PGyn: Denies history of abnormal paps or STDs Meds: PNV, Aldomet All: NKDA SH: Denies ETOH, tobacco or drugs, Works at the Debbie Townsend FH: Denies FH of mental retardation or genetic conditions. She has a cousin who had a baby with anencephaly ROS: no complaints  Exam: 148/87 No further examination performed  US (today): See full ultrasound report. Markedly low fluid. Anatomy not able to be visualized due to low fluid and maternal acoustics  Prenatal labs: 07/05/15: normal pap 08/15/15: Blood type A positive, antibody screen neg, RPR non-reactive, Rubella immune, Varicella immune, HIV non-reactive, Hep B neg, Hct 42.8, MCV 86, Plt 288, BUn 7, Cr 0.75, AST 17, ALT 24, Urine P/C 320 mg, GC/Chl neg, early glucola 135,  10/27/15: Quad screen negative  Assessment and Recommendations: 29 year-old  G2 P1 at 30 3/7 weeks with pregnancy complicated by obesity, chronic hypertension with elevated urine P/C ratio, pseudotumor cerebri, history of term cesarean delivery, vaginal bleeding in the early second trimester and ultrasound demonstrating oligohydramnios and anatomy screen very limited.  1. Obesity.  We discussed potential pregnancy complications associated with obesity. Early glucola was 135. Recommend to repeat at 26-28 weeks. Follow fetal growth.  2. Chronic Hypertension with baseline proteinuria. Debbie Townsend is at risk for preeclampsia. Recommend to start a daily baby aspirin (81 mg). Continue Aldomet.  We discussed signs and symptoms of preeclampsia as well as potential complications associated with chronic hypertension.  3. Pseudotumor cerebri.  Currently asymptomatic. Hopefully she will remain asymptomatic. Diamox can be used in pregnancy but as her pregnancy is complicated by oligohydramnios, would avoid use of diamox.  Recommned anesthesia consultation in the third trimester.  4. History of term low-transverse cesarean. We discussed risks and benefits of both a trial of labor after cesarean as well as planned cesarean. She will continue these discussions with Debbie Townsend.  5. Oligohydramnios in the mid-trimester. This is a high-risk scenario and we discussed the differential diagnosis associated with this condition. See ultrasound report from today's visit. The anatomic survey is markedly limited, therefore we are unable to determine if there is a fetal structurally etiology for the oligohydramnios. She denies leakage of fluid and had a recent examination at Debbie Townsend ruled out spontaneous rupture of the membranes.  We discussed potential genetic/chromosomal etiologies. She had a normal quad screen and declines amniocentesis. After speaking with our genetic counselor, she elected for cell-free fetal DNA testing but understands the limitation of this test.  She had two  weeks of vaginal  bleeding early in the second trimester. She may have resulting placental dysfunction that may manifest as oligo and possibly severe fetal growth restriction, Townsend we discussed the potential complications of that. She declines pregnancy termination and understands the state-imposed limitations on pregnancy options. She will return in two weeks for ultrasound and cell-free fetal DNA testing was sent.  We also reviewed her medication list. She stopped lisinopril early in the first trimester and has not been taking NSAIDS, diuretics or other medications that could potentially lead to oligohydramnios.  Saleema Weppler, [place] A, MD

## 2015-11-16 ENCOUNTER — Telehealth: Payer: Self-pay | Admitting: Obstetrics and Gynecology

## 2015-11-16 NOTE — Telephone Encounter (Signed)
Ms. Hanus called and requested to change her anatomy ultrasound from the [facility] in [place] to our [facility] location.  She also desired for this to be performed after [redacted] weeks gestation.  This was scheduled for Monday, December 19 at 1:00pm as requested by the patient.  A message was left on her phone with this date and time.  Wilburt Finlay, MS, CGC

## 2015-11-20 ENCOUNTER — Telehealth: Payer: Self-pay | Admitting: Obstetrics and Gynecology

## 2015-11-20 LAB — INFORMASEQ(SM) WITH XY ANALYSIS

## 2015-11-20 NOTE — Telephone Encounter (Signed)
The patient was informed that the results of her recent InformaSeq testing (performed at [facility]) were non-reportable due to a low fetal fraction.  There is data to suggest that pregnancies with low fetal fraction are at increased risk for chromosome conditions, though the risk difficult to quantify.  Of note for this patient, the second trimester maternal serum screening results were within normal limits (Down syndrome risk 1 in 3,311; trisomy 18 risk not increased, ONTD risk 1 in 4,803).  Is it not clear if the severe oligohydramnios could contribute to the low fetal fraction. If additional information about chromosome conditions is desired, amniocentesis could be considered.  A repeat sample could also be sent for cell free fetal DNA testing if desired, though the chance for a similar non-reportable outcome would be increased.    The patient is scheduled for a detailed anatomy ultrasound at [facility] on 12/07/2015 and can meet with a genetic counselor to discuss additional testing at that time if desired.  Of note, when we spoke, she mentioned a family history of a maternal aunt and maternal grandmother with pregnancies affected with open neural tube defects.  AFP testing in this pregnancy was normal.  We reviewed that there may be multifactorial causes for neural tube defects and that greater than 80% of cases can be detected by serum screening.  We are available to speak with this family further and may be reached at [phone].  Wilburt Finlay, MS, CGC

## 2015-11-27 ENCOUNTER — Ambulatory Visit: Payer: No Typology Code available for payment source

## 2015-12-22 ENCOUNTER — Encounter: Payer: Self-pay | Admitting: Physician Assistant

## 2015-12-22 ENCOUNTER — Ambulatory Visit: Payer: Self-pay | Admitting: Physician Assistant

## 2015-12-22 VITALS — BP 110/70 | HR 91 | Temp 98.0°F

## 2015-12-22 DIAGNOSIS — J069 Acute upper respiratory infection, unspecified: Secondary | ICD-10-CM

## 2015-12-22 MED ORDER — ALBUTEROL SULFATE HFA 108 (90 BASE) MCG/ACT IN AERS
2.0000 | INHALATION_SPRAY | Freq: Four times a day (QID) | RESPIRATORY_TRACT | Status: DC | PRN
Start: 2015-12-22 — End: 2017-05-15

## 2015-12-22 MED ORDER — AZITHROMYCIN 250 MG PO TABS: ORAL_TABLET | ORAL | Status: DC

## 2015-12-22 NOTE — Progress Notes (Signed)
S: C/o runny nose and congestion for 5 days, no fever, chills, cp/sob, v/d; mucus was green this am but clear throughout the day, cough is sporadic, seen at urgent care and put on amoxil, is 24 weeks preg  Using otc meds: robitussin  O: PE: vitals wnl, nad, perrl eomi, normocephalic, tms dull, nasal mucosa red and swollen, throat injected, neck supple no lymph, lungs c t a, cv rrr, neuro intact  A:  Acute  uri   P: zpack, albuterol inhaler; stop amoxil; drink fluids, continue regular meds , use otc meds of choice, return if not improving in 5 days, return earlier if worsening

## 2016-01-04 DIAGNOSIS — Z6841 Body Mass Index (BMI) 40.0 and over, adult: Secondary | ICD-10-CM | POA: Insufficient documentation

## 2016-01-04 LAB — HIV ANTIBODY (ROUTINE TESTING W REFLEX): HIV 1&2 Ab, 4th Generation: NEGATIVE

## 2016-01-08 DIAGNOSIS — Z2839 Other underimmunization status: Secondary | ICD-10-CM

## 2016-01-08 DIAGNOSIS — Z283 Underimmunization status: Secondary | ICD-10-CM | POA: Insufficient documentation

## 2016-01-08 DIAGNOSIS — O09899 Supervision of other high risk pregnancies, unspecified trimester: Secondary | ICD-10-CM | POA: Insufficient documentation

## 2016-01-08 HISTORY — DX: Supervision of other high risk pregnancies, unspecified trimester: O09.899

## 2016-01-08 HISTORY — DX: Other underimmunization status: Z28.39

## 2016-01-26 ENCOUNTER — Encounter: Payer: Self-pay | Admitting: *Deleted

## 2016-01-26 ENCOUNTER — Observation Stay
Admission: EM | Admit: 2016-01-26 | Discharge: 2016-01-26 | Disposition: A | Discharge status: Home / Self Care | Payer: Medicaid Other | Attending: Obstetrics and Gynecology | Admitting: Obstetrics and Gynecology

## 2016-01-26 DIAGNOSIS — G932 Benign intracranial hypertension: Secondary | ICD-10-CM

## 2016-01-26 DIAGNOSIS — Z3A30 30 weeks gestation of pregnancy: Secondary | ICD-10-CM | POA: Diagnosis not present

## 2016-01-26 DIAGNOSIS — O368130 Decreased fetal movements, third trimester, not applicable or unspecified: Principal | ICD-10-CM | POA: Insufficient documentation

## 2016-01-26 DIAGNOSIS — O10912 Unspecified pre-existing hypertension complicating pregnancy, second trimester: Secondary | ICD-10-CM

## 2016-01-26 DIAGNOSIS — O368190 Decreased fetal movements, unspecified trimester, not applicable or unspecified: Secondary | ICD-10-CM | POA: Diagnosis present

## 2016-01-26 DIAGNOSIS — O4102X0 Oligohydramnios, second trimester, not applicable or unspecified: Secondary | ICD-10-CM

## 2016-01-26 DIAGNOSIS — O99212 Obesity complicating pregnancy, second trimester: Secondary | ICD-10-CM

## 2016-01-26 NOTE — OB Triage Note (Signed)
Recvd from ED per wheelchair with c/o decreased fetal movement since 1300 today.  Changed to gown and to bed.  EFM applied. POC discussed and oriented to room.  Verbalized understanding and agrees with POC.

## 2016-01-26 NOTE — Discharge Instructions (Signed)
Please get plenty of rest and fluid. Please return to ED if symptoms worsen.

## 2016-02-02 MED ORDER — OXYTOCIN 40 UNITS IN LACTATED RINGERS INFUSION - SIMPLE MED
INTRAVENOUS | Status: AC
  Filled 2016-02-02: qty 1000

## 2016-02-08 ENCOUNTER — Encounter: Payer: Self-pay | Admitting: Physician Assistant

## 2016-02-08 ENCOUNTER — Ambulatory Visit: Payer: Self-pay | Admitting: Physician Assistant

## 2016-02-08 VITALS — BP 120/80 | HR 92 | Temp 98.3°F

## 2016-02-08 DIAGNOSIS — R059 Cough, unspecified: Secondary | ICD-10-CM

## 2016-02-08 DIAGNOSIS — R05 Cough: Secondary | ICD-10-CM

## 2016-02-08 DIAGNOSIS — J069 Acute upper respiratory infection, unspecified: Secondary | ICD-10-CM

## 2016-02-08 LAB — POCT INFLUENZA A/B
Influenza A, POC: NEGATIVE
Influenza B, POC: NEGATIVE

## 2016-02-08 NOTE — Progress Notes (Signed)
S: continued cough, no fever/chills/cp/sob; cough is dry and hacking, sx for over a week, is pregnant, ?what to take  O: vitals wnl, nad, ent wnl, neck supple no lymph, lungs c t a, cv rrr, cough is dry and hacking  A: acute uri  P: otc meds approved by gyn only

## 2016-06-03 ENCOUNTER — Ambulatory Visit: Payer: Self-pay | Admitting: Physician Assistant

## 2016-06-03 ENCOUNTER — Encounter: Payer: Self-pay | Admitting: Physician Assistant

## 2016-06-03 VITALS — BP 140/90 | HR 70 | Temp 98.3°F

## 2016-06-03 DIAGNOSIS — J069 Acute upper respiratory infection, unspecified: Secondary | ICD-10-CM

## 2016-06-03 DIAGNOSIS — W57XXXA Bitten or stung by nonvenomous insect and other nonvenomous arthropods, initial encounter: Secondary | ICD-10-CM

## 2016-06-03 MED ORDER — DOXYCYCLINE HYCLATE 100 MG PO TABS: 100.0000 mg | ORAL_TABLET | Freq: Two times a day (BID) | ORAL | Status: DC

## 2016-06-03 MED ORDER — METHYLPREDNISOLONE 4 MG PO TBPK: ORAL_TABLET | ORAL | Status: DC

## 2016-06-03 MED ORDER — FLUCONAZOLE 150 MG PO TABS: 150.0000 mg | ORAL_TABLET | Freq: Once | ORAL | Status: DC

## 2016-06-03 NOTE — Progress Notes (Signed)
S: C/o runny nose and congestion, sore throat, cough, hoarse voice for 3 days, ? Fever, having sweats; denies  chills, cp/sob, v/d; cough is sporadic, + tick bite last week, is not breast feeding  Using otc meds: robitussin  O: PE: vitals wnl, nad, voice is hoarse, perrl eomi, normocephalic, tms dull, nasal mucosa red and swollen, throat injected, neck supple no lymph, lungs c t a, cv rrr, neuro intact  A:  Acute uri, tick bite with ?fever  P: doxy 100mg  bid x 10d, medrol dose pack, diflucan if needed, drink fluids, continue regular meds , use otc meds of choice, return if not improving in 5 days, return earlier if worsening

## 2016-09-17 ENCOUNTER — Encounter (HOSPITAL_COMMUNITY): Payer: Self-pay

## 2016-10-21 ENCOUNTER — Ambulatory Visit: Payer: Self-pay | Admitting: Physician Assistant

## 2016-10-21 ENCOUNTER — Encounter: Payer: Self-pay | Admitting: Physician Assistant

## 2016-10-21 VITALS — BP 122/80 | HR 79 | Temp 98.4°F | Ht 67.0 in | Wt 254.0 lb

## 2016-10-21 DIAGNOSIS — E559 Vitamin D deficiency, unspecified: Secondary | ICD-10-CM

## 2016-10-21 DIAGNOSIS — R6882 Decreased libido: Secondary | ICD-10-CM

## 2016-10-21 DIAGNOSIS — R631 Polydipsia: Secondary | ICD-10-CM

## 2016-10-21 DIAGNOSIS — O26819 Pregnancy related exhaustion and fatigue, unspecified trimester: Secondary | ICD-10-CM

## 2016-10-21 NOTE — Addendum Note (Signed)
Addended by: Rudene Anda T on: 10/21/2016 09:49 AM   Modules accepted: Orders

## 2016-10-21 NOTE — Progress Notes (Signed)
S: c/o being tired and fatigued, having decreased libido, decreased lubrication and painful intercourse, no SI/HI, is postpartum by few months, is going to see a new gyn doctor this week and felt she should get yearly labs, also ?if she has diabetes as has been having increased thirst and some increased urination, hx of vit d deficiancy  O: vitals wnl, nad, lungs c ta , cv rrr  A: fatigue, decreased libido, vit d def, increased thirst  P: yearly labs drawn today, pt is to f/u with new gyn this Wednesday, return if any issues

## 2016-10-22 ENCOUNTER — Encounter: Payer: Self-pay | Admitting: Physician Assistant

## 2016-10-24 LAB — CMP12+LP+TP+TSH+6AC+CBC/D/PLT
ALT: 20 IU/L (ref 0–32)
AST: 16 IU/L (ref 0–40)
Albumin/Globulin Ratio: 1.8 (ref 1.2–2.2)
Albumin: 4.4 g/dL (ref 3.5–5.5)
Alkaline Phosphatase: 56 IU/L (ref 39–117)
BUN/Creatinine Ratio: 12 (ref 9–23)
BUN: 10 mg/dL (ref 6–20)
Basophils Absolute: 0 10*3/uL (ref 0.0–0.2)
Basos: 0 %
Bilirubin Total: 0.3 mg/dL (ref 0.0–1.2)
Calcium: 9 mg/dL (ref 8.7–10.2)
Chloride: 99 mmol/L (ref 96–106)
Chol/HDL Ratio: 2.8 ratio units (ref 0.0–4.4)
Cholesterol, Total: 136 mg/dL (ref 100–199)
Creatinine, Ser: 0.81 mg/dL (ref 0.57–1.00)
EOS (ABSOLUTE): 0.2 10*3/uL (ref 0.0–0.4)
Eos: 2 %
Estimated CHD Risk: <0.5 times avg. (ref 0.0–1.0)
Free Thyroxine Index: 1.5 (ref 1.2–4.9)
GFR calc Af Amer: 112 mL/min/{1.73_m2} (ref >59)
GFR calc non Af Amer: 97 mL/min/{1.73_m2} (ref >59)
GGT: 14 IU/L (ref 0–60)
Globulin, Total: 2.4 g/dL (ref 1.5–4.5)
Glucose: 93 mg/dL (ref 65–99)
HDL: 48 mg/dL (ref >39)
Hematocrit: 41.6 % (ref 34.0–46.6)
Hemoglobin: 13.5 g/dL (ref 11.1–15.9)
Immature Grans (Abs): 0.1 10*3/uL (ref 0.0–0.1)
Immature Granulocytes: 1 %
Iron: 44 ug/dL (ref 27–159)
LDH: 225 IU/L (ref 119–226)
LDL Calculated: 75 mg/dL (ref 0–99)
Lymphocytes Absolute: 1.6 10*3/uL (ref 0.7–3.1)
Lymphs: 16 %
MCH: 26.5 pg — ABNORMAL LOW (ref 26.6–33.0)
MCHC: 32.5 g/dL (ref 31.5–35.7)
MCV: 82 fL (ref 79–97)
Monocytes Absolute: 0.5 10*3/uL (ref 0.1–0.9)
Monocytes: 5 %
Neutrophils Absolute: 8.1 10*3/uL — ABNORMAL HIGH (ref 1.4–7.0)
Neutrophils: 76 %
Phosphorus: 3.9 mg/dL (ref 2.5–4.5)
Platelets: 325 10*3/uL (ref 150–379)
Potassium: 4.4 mmol/L (ref 3.5–5.2)
RBC: 5.09 x10E6/uL (ref 3.77–5.28)
RDW: 14.1 % (ref 12.3–15.4)
Sodium: 140 mmol/L (ref 134–144)
T3 Uptake Ratio: 25 % (ref 24–39)
T4, Total: 6.1 ug/dL (ref 4.5–12.0)
TSH: 2.71 u[IU]/mL (ref 0.450–4.500)
Total Protein: 6.8 g/dL (ref 6.0–8.5)
Triglycerides: 65 mg/dL (ref 0–149)
Uric Acid: 5.1 mg/dL (ref 2.5–7.1)
VLDL Cholesterol Cal: 13 mg/dL (ref 5–40)
WBC: 10.4 10*3/uL (ref 3.4–10.8)

## 2016-10-24 LAB — ESTROGENS, TOTAL: Estrogen: 127 pg/mL

## 2016-10-24 LAB — VITAMIN D 25 HYDROXY (VIT D DEFICIENCY, FRACTURES): Vit D, 25-Hydroxy: 31.2 ng/mL (ref 30.0–100.0)

## 2016-10-24 LAB — B12 AND FOLATE PANEL
Folate: 12 ng/mL (ref >3.0)
Vitamin B-12: 247 pg/mL (ref 211–946)

## 2016-10-24 LAB — FSH/LH
FSH: 7.4 m[IU]/mL
LH: 5.8 m[IU]/mL

## 2016-10-24 LAB — TESTOSTERONE: Testosterone: 8 ng/dL (ref 8–48)

## 2016-10-24 LAB — HEMOGLOBIN A1C
Est. average glucose Bld gHb Est-mCnc: 111 mg/dL
Hgb A1c MFr Bld: 5.5 % (ref 4.8–5.6)

## 2016-10-30 ENCOUNTER — Encounter: Payer: Self-pay | Admitting: Physician Assistant

## 2016-11-05 ENCOUNTER — Encounter: Payer: Self-pay | Admitting: Physician Assistant

## 2016-11-11 ENCOUNTER — Ambulatory Visit: Payer: Self-pay | Admitting: Physician Assistant

## 2016-11-11 ENCOUNTER — Encounter: Payer: Self-pay | Admitting: Physician Assistant

## 2016-11-11 VITALS — BP 139/80 | HR 78 | Temp 98.5°F

## 2016-11-11 DIAGNOSIS — J069 Acute upper respiratory infection, unspecified: Secondary | ICD-10-CM

## 2016-11-11 MED ORDER — PSEUDOEPH-BROMPHEN-DM 30-2-10 MG/5ML PO SYRP: 5.0000 mL | ORAL_SOLUTION | Freq: Four times a day (QID) | ORAL | 0 refills | Status: DC | PRN

## 2016-11-11 NOTE — Progress Notes (Signed)
   Subjective:URI    Patient ID: Debbie Townsend, female    DOB: [birth year], 30 y.o.   MRN: [id number]  HPI Patient c/o nasal congestion, post nasal drainage, and non-productive cough for 3 days. Denies fever/chill, or N/V/D. Recently resolve GI distress.  Review of Systems Negative except for compliant.    Objective:   Physical Exam  Bilateral maxillary guarding, edematous nasal turbinates.  Copious post nasal drainage. Neck supple, Lungs CTA, and Heart RRR.      Assessment & Plan:URI  Bromfed -DM. Follow up 3 days if no improvement.

## 2016-11-12 ENCOUNTER — Encounter: Payer: Self-pay | Admitting: Physician Assistant

## 2016-11-12 NOTE — Progress Notes (Signed)
Per Susan's authorization I called in Prednisone 30 mg 3 day supply with no refills and Zpack with no refill.

## 2016-11-12 NOTE — Telephone Encounter (Signed)
Call in zpack and prednisone 30mg  qd x 3d

## 2016-11-14 ENCOUNTER — Encounter: Payer: Self-pay | Admitting: Physician Assistant

## 2016-12-16 DIAGNOSIS — F419 Anxiety disorder, unspecified: Secondary | ICD-10-CM

## 2016-12-16 DIAGNOSIS — F329 Major depressive disorder, single episode, unspecified: Secondary | ICD-10-CM | POA: Insufficient documentation

## 2016-12-16 DIAGNOSIS — F32A Depression, unspecified: Secondary | ICD-10-CM | POA: Insufficient documentation

## 2016-12-16 HISTORY — DX: Depression, unspecified: F32.A

## 2016-12-16 HISTORY — DX: Anxiety disorder, unspecified: F41.9

## 2016-12-22 ENCOUNTER — Emergency Department
Admission: EM | Admit: 2016-12-22 | Discharge: 2016-12-22 | Disposition: A | Discharge status: Home / Self Care | Payer: Managed Care, Other (non HMO) | Attending: Emergency Medicine | Admitting: Emergency Medicine

## 2016-12-22 ENCOUNTER — Encounter: Payer: Self-pay | Admitting: Emergency Medicine

## 2016-12-22 DIAGNOSIS — B373 Candidiasis of vulva and vagina: Secondary | ICD-10-CM | POA: Diagnosis not present

## 2016-12-22 DIAGNOSIS — Z87891 Personal history of nicotine dependence: Secondary | ICD-10-CM | POA: Insufficient documentation

## 2016-12-22 DIAGNOSIS — I10 Essential (primary) hypertension: Secondary | ICD-10-CM | POA: Diagnosis not present

## 2016-12-22 DIAGNOSIS — N760 Acute vaginitis: Secondary | ICD-10-CM | POA: Insufficient documentation

## 2016-12-22 DIAGNOSIS — Z79899 Other long term (current) drug therapy: Secondary | ICD-10-CM | POA: Insufficient documentation

## 2016-12-22 DIAGNOSIS — N898 Other specified noninflammatory disorders of vagina: Secondary | ICD-10-CM | POA: Diagnosis present

## 2016-12-22 DIAGNOSIS — B3731 Acute candidiasis of vulva and vagina: Secondary | ICD-10-CM

## 2016-12-22 DIAGNOSIS — N3000 Acute cystitis without hematuria: Secondary | ICD-10-CM | POA: Insufficient documentation

## 2016-12-22 DIAGNOSIS — B9689 Other specified bacterial agents as the cause of diseases classified elsewhere: Secondary | ICD-10-CM

## 2016-12-22 LAB — WET PREP, GENITAL
Sperm: NONE SEEN
Trich, Wet Prep: NONE SEEN

## 2016-12-22 LAB — URINALYSIS, ROUTINE W REFLEX MICROSCOPIC
Bacteria, UA: NONE SEEN
Bilirubin Urine: NEGATIVE
Glucose, UA: NEGATIVE mg/dL
Ketones, ur: 5 mg/dL — AB
Nitrite: NEGATIVE
Protein, ur: 100 mg/dL — AB
Specific Gravity, Urine: 1.031 — ABNORMAL HIGH (ref 1.005–1.030)
pH: 5 (ref 5.0–8.0)

## 2016-12-22 LAB — POCT PREGNANCY, URINE: Preg Test, Ur: NEGATIVE

## 2016-12-22 MED ORDER — NITROFURANTOIN MONOHYD MACRO 100 MG PO CAPS: 100.0000 mg | ORAL_CAPSULE | Freq: Two times a day (BID) | ORAL | 0 refills | Status: DC

## 2016-12-22 MED ORDER — FLUCONAZOLE 150 MG PO TABS: 150.0000 mg | ORAL_TABLET | Freq: Once | ORAL | 0 refills | Status: AC

## 2016-12-22 MED ORDER — METRONIDAZOLE 500 MG PO TABS: 500.0000 mg | ORAL_TABLET | Freq: Two times a day (BID) | ORAL | 0 refills | Status: DC

## 2016-12-22 NOTE — ED Notes (Signed)
FIRST NURSE NOTE: Pt reports having yeast infection on Friday, bought OTC products and used them, states now she is having burning and pain in her vagina.

## 2016-12-22 NOTE — Discharge Instructions (Signed)
Take the prescription antibiotics as directed. Consider using OTC zinc oxide (AD Ointment, Balmex) for skin protection. Use gentle cleansers designed for the bikini area (Vagisil, Burnell Blanks, etc.) Follow-up with your provider for continued symptoms.

## 2016-12-22 NOTE — ED Provider Notes (Signed)
[facility] Emergency Department Provider Note ____________________________________________  Time seen: 34  I have reviewed the triage vital signs and the nursing notes.  HISTORY  Chief Complaint  Vaginal Itching  HPI Debbie Townsend is a 31 y.o. female presents to the ED for evaluation of vaginal itching that she noted 2 days prior. Describes some local irritation and swelling to the vulvar after the administration of a topical over-the-counter Monistat cream. She also notes some discomfort in his skin when she passes urine. She denies any abnormal vaginal discharge, abnormal bleeding, or pelvic pain. She did note a scant amount of blood in the toilet after she urinated earlier today. She denies any fevers, chills, nausea, or vomiting. She reports her LMP was 12/13/16. She is 9 months postpartum and is not breast feeding or lactating.  Past Medical History:  Diagnosis Date  . Anxiety   . Family history of anesthesia complication     mother had Post -op nausea and dizziness.  Marland Kitchen Headache   . Hypertension   . Kidney stones   . Pseudotumor cerebri 2009    Patient Active Problem List   Diagnosis Date Noted  . Decreased fetal movement 01/26/2016  . BMI 40.0-44.9, adult ([place]) 01/04/2016  . Oligohydramnios antepartum 11/13/2015  . Chronic hypertension in pregnancy 11/13/2015  . Pseudotumor cerebri 11/13/2015  . Maternal obesity syndrome in second trimester 11/13/2015  . HNP (herniated nucleus pulposus), lumbar 09/22/2014    Past Surgical History:  Procedure Laterality Date  . CESAREAN SECTION    . LUMBAR LAMINECTOMY/DECOMPRESSION MICRODISCECTOMY Left 09/22/2014   Procedure: Left Lumbar four-five microdiskectomy;  Surgeon: Consuella Lose, MD;  Location: [place] [facility];  Service: Neurosurgery;  Laterality: Left;  Left Lumbar four-five microdiskectomy    Prior to Admission medications   Medication Sig Start Date End Date Taking? Authorizing Provider   albuterol (PROVENTIL HFA;VENTOLIN HFA) 108 (90 Base) MCG/ACT inhaler Inhale 2 puffs into the lungs every 6 (six) hours as needed for wheezing or shortness of breath. 12/22/15   Versie Starks, PA-C  brompheniramine-pseudoephedrine-DM 30-2-10 MG/5ML syrup Take 5 mLs by mouth 4 (four) times daily as needed. 11/11/16   Sable Feil, PA-C  fluconazole (DIFLUCAN) 150 MG tablet Take 1 tablet (150 mg total) by mouth once. May repeat dose in 1 week if necessary. 12/22/16 12/22/16  Trinady Milewski V Bacon Sheral Pfahler, PA-C  FLUoxetine (PROZAC) 10 MG capsule Take 10 mg by mouth daily. 10/24/16   Historical Provider, MD  metroNIDAZOLE (FLAGYL) 500 MG tablet Take 1 tablet (500 mg total) by mouth 2 (two) times daily. 12/22/16   Blaiden Werth V Bacon Neyland Pettengill, PA-C  nitrofurantoin, macrocrystal-monohydrate, (MACROBID) 100 MG capsule Take 1 capsule (100 mg total) by mouth 2 (two) times daily. 12/22/16   Azarel Banner V Bacon Glenola Wheat, PA-C    Allergies Other and Apple  Family History  Problem Relation Age of Onset  . Cancer Mother   . Hypertension Mother   . Diabetes Mother   . Liver disease Father   . Heart attack Father   . Arthritis Father   . Arthritis Sister     Social History Social History  Substance Use Topics  . Smoking status: Former Smoker    Types: Cigarettes  . Smokeless tobacco: Never Used  . Alcohol use No     Comment: " occasionally"    Review of Systems  Constitutional: Negative for fever. Gastrointestinal: Negative for abdominal pain, vomiting and diarrhea. Genitourinary: Positive for dysuria. Vulvar irritation as above. Musculoskeletal: Negative for  back pain. Skin: Negative for rash. Neurological: Negative for headaches, focal weakness or numbness. ____________________________________________  PHYSICAL EXAM:  VITAL SIGNS: ED Triage Vitals [12/22/16 1523]  Enc Vitals Group     BP (!) 185/112     Pulse Rate 88     Resp 18     Temp 98.4 F (36.9 C)     Temp Source Oral     SpO2 98 %     Weight  250 lb (113.4 kg)     Height 5\' 7"  (1.702 m)     Head Circumference      Peak Flow      Pain Score 6     Pain Loc      Pain Edu?      Excl. in [place]?    Constitutional: Alert and oriented. Well appearing and in no distress. Head: Normocephalic and atraumatic. Respiratory: Normal respiratory effort.  GU: Normal external genitalia. Some local irritation and erythema to the internal labia. Moderate white discharge in the vault. No CMT. No cervical friability. Os closed Skin:  Skin is warm, dry and intact. Vulvar rash noted. ____________________________________________   LABS (pertinent positives/negatives) Labs Reviewed  WET PREP, GENITAL - Abnormal; Notable for the following:       Result Value   Yeast Wet Prep HPF POC PRESENT (*)    Clue Cells Wet Prep HPF POC PRESENT (*)    WBC, Wet Prep HPF POC MANY (*)    All other components within normal limits  URINALYSIS, ROUTINE W REFLEX MICROSCOPIC - Abnormal; Notable for the following:    Color, Urine YELLOW (*)    APPearance CLOUDY (*)    Specific Gravity, Urine 1.031 (*)    Hgb urine dipstick SMALL (*)    Ketones, ur 5 (*)    Protein, ur 100 (*)    Leukocytes, UA LARGE (*)    Squamous Epithelial / LPF 6-30 (*)    All other components within normal limits  URINE CULTURE  POC URINE PREG, ED  POCT PREGNANCY, URINE  ____________________________________________  INITIAL IMPRESSION / ASSESSMENT AND PLAN / ED COURSE  Patient with symptoms consistent with a yeast vaginitis and BV on wet prep. She also was found to have a bacteriuria, so urine culture is pending. She will be discharged at this time with a prescription for Diflucan, Flagyl, and Macrobid. She is advised to use an over-the-counter barrier cream for vulvar irritation. She will follow up with her primary gynecologist for reassessment and test of cure.  Clinical Course    ____________________________________________  FINAL CLINICAL IMPRESSION(S) / ED DIAGNOSES  Final  diagnoses:  Acute cystitis without hematuria  BV (bacterial vaginosis)  Yeast vaginitis      Melvenia Needles, PA-C 12/22/16 1824    Lisa Roca, MD 12/22/16 2140

## 2016-12-22 NOTE — ED Notes (Addendum)
See triage note  States she thought she had a yeast infection  Started on OTC meds   But feels like it is worse  Positive itching  Developed some swelling and redness to vaginal area after using monostat

## 2016-12-22 NOTE — ED Triage Notes (Signed)
Pt states that she started with itching on Friday, states that she used monistat, pt states that she is very irritated and swollen now

## 2016-12-25 LAB — URINE CULTURE
Culture: 40000 — AB
Special Requests: NORMAL

## 2017-02-27 ENCOUNTER — Encounter: Payer: Self-pay | Admitting: Emergency Medicine

## 2017-02-27 ENCOUNTER — Emergency Department: Payer: Managed Care, Other (non HMO)

## 2017-02-27 ENCOUNTER — Ambulatory Visit: Payer: Self-pay | Admitting: Physician Assistant

## 2017-02-27 ENCOUNTER — Encounter: Payer: Self-pay | Admitting: Physician Assistant

## 2017-02-27 ENCOUNTER — Emergency Department
Admission: EM | Admit: 2017-02-27 | Discharge: 2017-02-27 | Disposition: A | Discharge status: Home / Self Care | Payer: Managed Care, Other (non HMO) | Attending: Emergency Medicine | Admitting: Emergency Medicine

## 2017-02-27 VITALS — BP 120/79 | HR 67 | Temp 98.1°F

## 2017-02-27 DIAGNOSIS — Z87891 Personal history of nicotine dependence: Secondary | ICD-10-CM | POA: Diagnosis not present

## 2017-02-27 DIAGNOSIS — R200 Anesthesia of skin: Secondary | ICD-10-CM | POA: Insufficient documentation

## 2017-02-27 DIAGNOSIS — G459 Transient cerebral ischemic attack, unspecified: Secondary | ICD-10-CM

## 2017-02-27 DIAGNOSIS — Z79899 Other long term (current) drug therapy: Secondary | ICD-10-CM | POA: Diagnosis not present

## 2017-02-27 DIAGNOSIS — I10 Essential (primary) hypertension: Secondary | ICD-10-CM | POA: Diagnosis not present

## 2017-02-27 DIAGNOSIS — R4182 Altered mental status, unspecified: Secondary | ICD-10-CM | POA: Diagnosis present

## 2017-02-27 DIAGNOSIS — R55 Syncope and collapse: Secondary | ICD-10-CM

## 2017-02-27 LAB — DIFFERENTIAL
Basophils Absolute: 0.1 10*3/uL (ref 0–0.1)
Basophils Relative: 0 %
Eosinophils Absolute: 0.2 10*3/uL (ref 0–0.7)
Eosinophils Relative: 1 %
Lymphocytes Relative: 16 %
Lymphs Abs: 2 10*3/uL (ref 1.0–3.6)
Monocytes Absolute: 0.7 10*3/uL (ref 0.2–0.9)
Monocytes Relative: 6 %
Neutro Abs: 9.5 10*3/uL — ABNORMAL HIGH (ref 1.4–6.5)
Neutrophils Relative %: 77 %

## 2017-02-27 LAB — COMPREHENSIVE METABOLIC PANEL
ALT: 26 U/L (ref 14–54)
AST: 20 U/L (ref 15–41)
Albumin: 4.4 g/dL (ref 3.5–5.0)
Alkaline Phosphatase: 49 U/L (ref 38–126)
Anion gap: 7 (ref 5–15)
BUN: 14 mg/dL (ref 6–20)
CO2: 25 mmol/L (ref 22–32)
Calcium: 9 mg/dL (ref 8.9–10.3)
Chloride: 103 mmol/L (ref 101–111)
Creatinine, Ser: 0.69 mg/dL (ref 0.44–1.00)
GFR calc Af Amer: >60 mL/min (ref >60)
GFR calc non Af Amer: >60 mL/min (ref >60)
Glucose, Bld: 99 mg/dL (ref 65–99)
Potassium: 4 mmol/L (ref 3.5–5.1)
Sodium: 135 mmol/L (ref 135–145)
Total Bilirubin: 0.4 mg/dL (ref 0.3–1.2)
Total Protein: 7.6 g/dL (ref 6.5–8.1)

## 2017-02-27 LAB — CBC
HCT: 40.5 % (ref 35.0–47.0)
Hemoglobin: 13.4 g/dL (ref 12.0–16.0)
MCH: 26.4 pg (ref 26.0–34.0)
MCHC: 33.1 g/dL (ref 32.0–36.0)
MCV: 79.8 fL — ABNORMAL LOW (ref 80.0–100.0)
Platelets: 339 10*3/uL (ref 150–440)
RBC: 5.08 MIL/uL (ref 3.80–5.20)
RDW: 15.2 % — ABNORMAL HIGH (ref 11.5–14.5)
WBC: 12.4 10*3/uL — ABNORMAL HIGH (ref 3.6–11.0)

## 2017-02-27 LAB — TROPONIN I: Troponin I: <0.03 ng/mL (ref <0.03)

## 2017-02-27 LAB — PROTIME-INR
INR: 0.97
Prothrombin Time: 12.9 seconds (ref 11.4–15.2)

## 2017-02-27 LAB — APTT: aPTT: 29 seconds (ref 24–36)

## 2017-02-27 MED ORDER — SODIUM CHLORIDE 0.9 % IV SOLN: Freq: Once | INTRAVENOUS | Status: DC

## 2017-02-27 MED ORDER — METOCLOPRAMIDE HCL 5 MG/ML IJ SOLN: 10.0000 mg | Freq: Once | INTRAMUSCULAR | Status: DC

## 2017-02-27 MED ORDER — KETOROLAC TROMETHAMINE 30 MG/ML IJ SOLN: 30.0000 mg | Freq: Once | INTRAMUSCULAR | Status: DC

## 2017-02-27 NOTE — ED Provider Notes (Signed)
[facility] Emergency Department Provider Note        Time seen: ----------------------------------------- 2:05 PM on 02/27/2017 -----------------------------------------    I have reviewed the triage vital signs and the nursing notes.   HISTORY  Chief Complaint Altered Mental Status    HPI Debbie Townsend is a 31 y.o. female who presents to ER for a transient episode where she couldn't move either arm and she couldn't speak. Patient states she had been away for a while, had taken a shower and as she was going out the door the symptoms began. Patient reports worsening headaches over the past 2 weeks but no blurry vision or other symptoms. Patient describes a "foggy feeling". Her story differs from the triage notes which reports one sided symptoms. Patient states symptoms were diffuse on both sides of her body with facial numbness and arm weakness.Patient also felt like she may pass out during that time.   Past Medical History:  Diagnosis Date  . Anxiety   . Family history of anesthesia complication     mother had Post -op nausea and dizziness.  Marland Kitchen Headache   . Hypertension   . Kidney stones   . Pseudotumor cerebri 2009    Patient Active Problem List   Diagnosis Date Noted  . Decreased fetal movement 01/26/2016  . BMI 40.0-44.9, adult ([place]) 01/04/2016  . Oligohydramnios antepartum 11/13/2015  . Chronic hypertension in pregnancy 11/13/2015  . Pseudotumor cerebri 11/13/2015  . Maternal obesity syndrome in second trimester 11/13/2015  . HNP (herniated nucleus pulposus), lumbar 09/22/2014    Past Surgical History:  Procedure Laterality Date  . CESAREAN SECTION    . LUMBAR LAMINECTOMY/DECOMPRESSION MICRODISCECTOMY Left 09/22/2014   Procedure: Left Lumbar four-five microdiskectomy;  Surgeon: Consuella Lose, MD;  Location: [place] [facility];  Service: Neurosurgery;  Laterality: Left;  Left Lumbar four-five microdiskectomy    Allergies Other and  Apple  Social History Social History  Substance Use Topics  . Smoking status: Former Smoker    Types: Cigarettes  . Smokeless tobacco: Never Used  . Alcohol use No     Comment: " occasionally"    Review of Systems Constitutional: Negative for fever. Cardiovascular: Negative for chest pain. Respiratory: Negative for shortness of breath. Gastrointestinal: Negative for abdominal pain, vomiting and diarrhea. Genitourinary: Negative for dysuria. Musculoskeletal: Negative for back pain. Skin: Negative for rash. Neurological: Positive for numbness and weakness.  10-point ROS otherwise negative.  ____________________________________________   PHYSICAL EXAM:  VITAL SIGNS: ED Triage Vitals  Enc Vitals Group     BP 02/27/17 1337 139/77     Pulse Rate 02/27/17 1337 72     Resp 02/27/17 1337 18     Temp 02/27/17 1337 98.2 F (36.8 C)     Temp Source 02/27/17 1337 Oral     SpO2 02/27/17 1337 100 %     Weight 02/27/17 1338 260 lb (117.9 kg)     Height 02/27/17 1338 5\' 7"  (1.702 m)     Head Circumference --      Peak Flow --      Pain Score --      Pain Loc --      Pain Edu? --      Excl. in [place]? --     Constitutional: Alert and oriented. Well appearing and in no distress. Eyes: Conjunctivae are normal. PERRL. Normal extraocular movements. ENT   Head: Normocephalic and atraumatic.   Nose: No congestion/rhinnorhea.   Mouth/Throat: Mucous membranes are moist.  Neck: No stridor. Cardiovascular: Normal rate, regular rhythm. No murmurs, rubs, or gallops. Respiratory: Normal respiratory effort without tachypnea nor retractions. Breath sounds are clear and equal bilaterally. No wheezes/rales/rhonchi. Gastrointestinal: Soft and nontender. Normal bowel sounds Musculoskeletal: Nontender with normal range of motion in all extremities. No lower extremity tenderness nor edema. Neurologic:  Normal speech and language. No gross focal neurologic deficits are appreciated.  Strength, sensation, cranial nerves, cerebellar function appear to be normal. Negative Romberg. Skin:  Skin is warm, dry and intact. No rash noted. Psychiatric: Mood and affect are normal. Speech and behavior are normal.  ____________________________________________  ED COURSE:  Pertinent labs & imaging results that were available during my care of the patient were reviewed by me and considered in my medical decision making (see chart for details). Patient presents to the ER after transient episode of syncope followed by facial and arm weakness and numbness. We will assess with labs and imaging.   Procedures ____________________________________________   LABS (pertinent positives/negatives)  Labs Reviewed  CBC - Abnormal; Notable for the following:       Result Value   WBC 12.4 (*)    MCV 79.8 (*)    RDW 15.2 (*)    All other components within normal limits  DIFFERENTIAL - Abnormal; Notable for the following:    Neutro Abs 9.5 (*)    All other components within normal limits  PROTIME-INR  APTT  COMPREHENSIVE METABOLIC PANEL  TROPONIN I    RADIOLOGY  CT head is unremarkable  ____________________________________________  FINAL ASSESSMENT AND PLAN  Near-syncope, numbness  Plan: Patient with labs and imaging as dictated above. No clear etiology for the patient's symptoms identified. I discussed the case with Dr. Doy Mince from neurology recommends a baby aspirin and an outpatient follow-up with her doctor. At this point she has no visual complaints to suggest pseudotumor. She is stable for discharge.   Earleen Newport, MD   Note: This note was generated in part or whole with voice recognition software. Voice recognition is usually quite accurate but there are transcription errors that can and very often do occur. I apologize for any typographical errors that were not detected and corrected.     Earleen Newport, MD 02/27/17 [phone]

## 2017-02-27 NOTE — Progress Notes (Signed)
This was a code stroke. Chaplain arrived as a physician was testing the patient. Pt. Seated on the bed and she was interacting with the medical team that was treating her. Chaplain spoke with the patient and she said that she was okay. She did not need a Chaplain.

## 2017-02-27 NOTE — ED Notes (Signed)
Pt presents with symptoms that started this morning and only lasted 5 minutes. Pt states that she tried to grab on to her husband but couldn't use her hands to grip. Her face was numb and she was unable to speak. She reports this happened on both sides. Pt states she feels "a little fuzzy" now. Pt alert & oriented with NAD noted.

## 2017-02-27 NOTE — ED Notes (Signed)
Pt discharged home after verbalizing understanding of discharge instructions; nad noted. 

## 2017-02-27 NOTE — ED Triage Notes (Signed)
Patient presents to the ED with feeling "fuzzy headed, abnormal"  After patient had an episode this morning of hemiparesis to the left side of her face and aphasia that started at approx. 6:40am.  Patient states she had been awake for awhile, taken a shower and as she was going out the door symptoms began.  Patient reports increasingly severe headaches x 2 weeks.

## 2017-02-27 NOTE — Progress Notes (Signed)
S: states this morning when she was getting ready to go to work she felt hot and her face, tongue, arms, and hands went numb, couldn't speak for a few minutes, couldn't grab onto her husband to hold herself up, when she could talk her speech was slurred, sx lasted for about 5 minutes, had 2 more episodes of being "tongue tied" today at work, states she has been having bad headaches for last 2 weeks, has hx of htn, is on lisinopril 20mg , also taking prozac for post partum depression, denies cp/sob  O: vitals wnl, nad, pt appears well at this time, perrl eomi, neck supple, no lymph, no carotid bruits noted, lungs c t a, cv rrr, grips = b/l, neuro is grossly intact  A: ?tia, near syncope  P: pt sent to ER, rma called charge nurse to notify of pt's condition and complaint

## 2017-02-28 ENCOUNTER — Encounter: Payer: Self-pay | Admitting: Physician Assistant

## 2017-02-28 LAB — GLUCOSE, CAPILLARY: Glucose-Capillary: 80 mg/dL (ref 65–99)

## 2017-03-06 ENCOUNTER — Ambulatory Visit: Payer: Self-pay | Admitting: Physician Assistant

## 2017-03-06 ENCOUNTER — Encounter: Payer: Self-pay | Admitting: Physician Assistant

## 2017-03-06 VITALS — BP 125/70 | HR 77 | Temp 98.3°F | Ht 67.0 in | Wt 264.0 lb

## 2017-03-06 DIAGNOSIS — Z0189 Encounter for other specified special examinations: Secondary | ICD-10-CM

## 2017-03-06 DIAGNOSIS — Z008 Encounter for other general examination: Secondary | ICD-10-CM

## 2017-03-06 DIAGNOSIS — J069 Acute upper respiratory infection, unspecified: Secondary | ICD-10-CM

## 2017-03-06 MED ORDER — FLUCONAZOLE 150 MG PO TABS: 150.0000 mg | ORAL_TABLET | Freq: Once | ORAL | 0 refills | Status: AC

## 2017-03-06 MED ORDER — AMOXICILLIN 875 MG PO TABS: 875.0000 mg | ORAL_TABLET | Freq: Two times a day (BID) | ORAL | 0 refills | Status: DC

## 2017-03-06 NOTE — Progress Notes (Signed)
S: C/o hoarse voice, cough, runny nose and congestion for 3 days, no fever, chills, cp/sob, v/d or body aches; mucus was green this am but clear throughout the day, cough is sporadic, also ?if we can do her biometric measurements  Using otc meds:   O: PE: vitals wnl, nad, perrl eomi, normocephalic, tms dull, nasal mucosa red and swollen, throat injected, neck supple no lymph, lungs c t a, cv rrr, neuro intact  A:  Acute viral uri, biometric screening   P: drink fluids, continue regular meds , use otc meds of choice, return if not improving in 5 days, return earlier if worsening , if worsening use amoxil, diflucan if needed, also needs referral to neurology after TIA and ER visit for same

## 2017-03-11 ENCOUNTER — Ambulatory Visit: Payer: Self-pay | Admitting: Physician Assistant

## 2017-05-06 ENCOUNTER — Ambulatory Visit: Payer: Self-pay | Admitting: Family

## 2017-05-06 VITALS — BP 110/65 | HR 80 | Temp 98.0°F

## 2017-05-06 DIAGNOSIS — B3731 Acute candidiasis of vulva and vagina: Secondary | ICD-10-CM

## 2017-05-06 DIAGNOSIS — B373 Candidiasis of vulva and vagina: Secondary | ICD-10-CM

## 2017-05-06 DIAGNOSIS — B349 Viral infection, unspecified: Secondary | ICD-10-CM

## 2017-05-06 LAB — POCT RAPID STREP A (OFFICE): Rapid Strep A Screen: NEGATIVE

## 2017-05-06 LAB — POCT INFLUENZA A/B
Influenza A, POC: NEGATIVE
Influenza B, POC: NEGATIVE

## 2017-05-06 MED ORDER — FLUCONAZOLE 150 MG PO TABS: ORAL_TABLET | ORAL | 0 refills | Status: DC

## 2017-05-06 NOTE — Addendum Note (Signed)
Addended by: Rudene Anda T on: 05/06/2017 01:56 PM   Modules accepted: Orders

## 2017-05-06 NOTE — Progress Notes (Signed)
S/ 2-3 d hx of cough , body aches, fever to 101, ST , no abd pain or GI sxs , taking otcs, and allegra for allergies Yeast sxs not responding to monistat O/ VSS flu and strep swabs neg mildly ill ENT unremarkable, neck supple heart rsr lungs clear A/ viral illness, yeast sxs  P/  viral course discussed and supportive measures encouraged. Supportive measures discussed. Follow up prn not improving   rx diflucan .

## 2017-05-13 ENCOUNTER — Encounter: Payer: Self-pay | Admitting: Physician Assistant

## 2017-05-13 ENCOUNTER — Other Ambulatory Visit: Payer: Self-pay | Admitting: Physician Assistant

## 2017-05-13 MED ORDER — FLUCONAZOLE 150 MG PO TABS
ORAL_TABLET | ORAL | 0 refills | Status: DC
Start: 2017-05-13 — End: 2017-05-15

## 2017-05-13 MED ORDER — BENZONATATE 200 MG PO CAPS: 200.0000 mg | ORAL_CAPSULE | Freq: Three times a day (TID) | ORAL | 0 refills | Status: DC | PRN

## 2017-05-13 MED ORDER — CEFDINIR 300 MG PO CAPS: 300.0000 mg | ORAL_CAPSULE | Freq: Two times a day (BID) | ORAL | 0 refills | Status: DC

## 2017-05-13 NOTE — Telephone Encounter (Signed)
Please see message. °

## 2017-05-13 NOTE — Telephone Encounter (Signed)
Sent rx for omnicef , diflucan, and tessalon perls to [facility], f/u if not better in 3-5 days

## 2017-05-15 ENCOUNTER — Encounter: Payer: Self-pay | Admitting: Physician Assistant

## 2017-05-15 ENCOUNTER — Ambulatory Visit: Payer: Self-pay | Admitting: Physician Assistant

## 2017-05-15 VITALS — BP 126/68 | HR 75 | Temp 97.5°F

## 2017-05-15 DIAGNOSIS — Z0000 Encounter for general adult medical examination without abnormal findings: Secondary | ICD-10-CM

## 2017-05-15 NOTE — Progress Notes (Signed)
S: pt here for wellness physical had biometrics for insurance purposes done here in the clinic previously, no complaints ros neg. PMH: anxiety, htn    Social: former smoker, no drugs, occ etoh Fam: as noted on chart  O: vitals wnl, nad, ENT wnl, neck supple no lymph, lungs c t a, cv rrr, abd soft nontender bs normal all 4 quads  A: wellness physical  P: f/u prn

## 2017-05-16 MED ORDER — CLINDAMYCIN PHOSPHATE 100 MG VA SUPP: 100.0000 mg | Freq: Every day | VAGINAL | 0 refills | Status: DC

## 2017-05-16 NOTE — Progress Notes (Signed)
Pt has resistant bv and is still symptomatic with odor and discharge, has been on 2 rounds of flaygl, sent rx for clindamycin 100mg  vaginal supp x 3d to pharmacy

## 2017-05-16 NOTE — Addendum Note (Signed)
Addended by: Versie Starks on: 05/16/2017 03:01 PM   Modules accepted: Orders

## 2017-06-16 ENCOUNTER — Telehealth: Payer: Self-pay | Admitting: Emergency Medicine

## 2017-06-16 NOTE — Telephone Encounter (Signed)
Patient called and expressed that she have been taking Amoxil for a sinus infection.  She forgot to ask her physician for a Diflucan and has developed a bad yeast infection due to the medication.  She wants to know if we can call in a Diflucan.  I informed Azucena Fallen and she approved me to call in Diflucan 150 with no refills in to [facility] in [place], [place].  I called it to [phone]. Patient has been informed.

## 2017-06-25 ENCOUNTER — Encounter: Payer: Self-pay | Admitting: Physician Assistant

## 2017-06-25 ENCOUNTER — Ambulatory Visit: Payer: Self-pay | Admitting: Physician Assistant

## 2017-06-25 VITALS — BP 120/79 | HR 82 | Temp 98.5°F | Resp 17

## 2017-06-25 DIAGNOSIS — J209 Acute bronchitis, unspecified: Secondary | ICD-10-CM

## 2017-06-25 MED ORDER — METHYLPREDNISOLONE 4 MG PO TBPK: ORAL_TABLET | ORAL | 0 refills | Status: DC

## 2017-06-25 MED ORDER — FLUCONAZOLE 150 MG PO TABS: 150.0000 mg | ORAL_TABLET | Freq: Once | ORAL | 0 refills | Status: AC

## 2017-06-25 MED ORDER — AZITHROMYCIN 250 MG PO TABS: ORAL_TABLET | ORAL | 0 refills | Status: DC

## 2017-06-25 MED ORDER — ALBUTEROL SULFATE HFA 108 (90 BASE) MCG/ACT IN AERS: 2.0000 | INHALATION_SPRAY | Freq: Four times a day (QID) | RESPIRATORY_TRACT | 0 refills | Status: DC | PRN

## 2017-06-25 NOTE — Progress Notes (Signed)
S: C/o cough and congestion with wheezing and chest pain, chest is sore from coughing, did have fever, chills last week, mucus is green, mainly cough is dry and hacking; keeping pt awake at night;  denies cardiac type chest pain or sob, v/d, abd pain Remainder ros neg  O: vitals wnl, nad, tms clear, throat injected, neck supple no lymph, lungs c t a, cv rrr, neuro intact, cough is dry and hacking  A:  Acute bronchitis   P:  rx medication:  Zpack, medrol dose pack, albuterol inhaler, diflucan,  use otc meds, tylenol or motrin as needed for fever/chills, return if not better in 3 -5 days, return earlier if worsening

## 2017-08-06 ENCOUNTER — Ambulatory Visit (INDEPENDENT_AMBULATORY_CARE_PROVIDER_SITE_OTHER): Payer: Managed Care, Other (non HMO) | Admitting: Family Medicine

## 2017-08-06 ENCOUNTER — Encounter: Payer: Self-pay | Admitting: Family Medicine

## 2017-08-06 VITALS — BP 117/78 | HR 66 | Wt 261.0 lb

## 2017-08-06 DIAGNOSIS — N761 Subacute and chronic vaginitis: Secondary | ICD-10-CM

## 2017-08-06 DIAGNOSIS — Z803 Family history of malignant neoplasm of breast: Secondary | ICD-10-CM

## 2017-08-06 NOTE — Progress Notes (Signed)
   CLINIC ENCOUNTER NOTE  History:  31 y.o. G2P1001 here today to establish care and discuss genetic screening. She denies any abnormal vaginal discharge, bleeding, pelvic pain or other concerns.   Pain with intercourse: has regular periods. Reports severa vaginal dryness. Has tried OTC lubricants that have not helped.   BV- ocacasionaly d/c. Has not started culturelle  BRCA testing- mother with BC before 42. MGM with mutliple cancers.   Past Medical History:  Diagnosis Date  . Anxiety   . Family history of anesthesia complication     mother had Post -op nausea and dizziness.  Marland Kitchen Headache   . Hypertension   . Kidney stones   . Pseudotumor cerebri 2009    Past Surgical History:  Procedure Laterality Date  . CESAREAN SECTION    . LUMBAR LAMINECTOMY/DECOMPRESSION MICRODISCECTOMY Left 09/22/2014   Procedure: Left Lumbar four-five microdiskectomy;  Surgeon: Consuella Lose, MD;  Location: [place] [facility];  Service: Neurosurgery;  Laterality: Left;  Left Lumbar four-five microdiskectomy    The following portions of the patient's history were reviewed and updated as appropriate: allergies, current medications, past family history, past medical history, past social history, past surgical history and problem list.   Health Maintenance:  Has PCP that does paps,  Review of Systems:  Pertinent items noted in HPI and remainder of comprehensive ROS otherwise negative.   Objective:  Physical Exam BP 117/78   Pulse 66   Wt 261 lb (118.4 kg)   LMP 07/30/2017   BMI 40.88 kg/m  Gen: Obese female. Well developed. NAD LUNG: normal WOB Heart: RR Pelvic: deferred  Labs and Imaging No results found.  Assessment & Plan:   1. Chronic vaginitis -Recommended coconut oil or crisco for lubrication - Prolactin - TSH - Follicle stimulating hormone - LH - If BRCA neg then patient would be eligible for estrace cream  2. Family history of breast cancer - mother with early breast cancer.  Provided support to patient regarding nervousness about testing - [facility] Genetic Screening  3. Recurrent BV- stable now with no sx. Will not test. Recommended starting culturelle.  Routine preventative health maintenance measures emphasized. Please refer to After Visit Summary for other counseling recommendations.   Return in about 3 months (around 11/06/2017), or if symptoms worsen or fail to improve.   Total face-to-face time with patient: 30 minutes. Over 50% of encounter was spent on counseling and coordination of care.

## 2017-08-08 LAB — FOLLICLE STIMULATING HORMONE: FSH: 7.2 m[IU]/mL

## 2017-08-08 LAB — PROLACTIN: Prolactin: 14.6 ng/mL (ref 4.8–23.3)

## 2017-08-08 LAB — TSH: TSH: 3.03 u[IU]/mL (ref 0.450–4.500)

## 2017-08-08 LAB — LUTEINIZING HORMONE: LH: 8.6 m[IU]/mL

## 2017-09-02 ENCOUNTER — Encounter: Payer: Self-pay | Admitting: *Deleted

## 2017-09-10 ENCOUNTER — Encounter: Payer: Self-pay | Admitting: Radiology

## 2018-02-11 ENCOUNTER — Other Ambulatory Visit: Payer: Self-pay | Admitting: Family Medicine

## 2018-02-11 DIAGNOSIS — R1011 Right upper quadrant pain: Secondary | ICD-10-CM

## 2018-02-16 ENCOUNTER — Ambulatory Visit
Admission: RE | Admit: 2018-02-16 | Discharge: 2018-02-16 | Disposition: A | Discharge status: Home / Self Care | Payer: Managed Care, Other (non HMO) | Source: Ambulatory Visit | Attending: Family Medicine | Admitting: Family Medicine

## 2018-02-16 DIAGNOSIS — K8020 Calculus of gallbladder without cholecystitis without obstruction: Secondary | ICD-10-CM | POA: Insufficient documentation

## 2018-02-16 DIAGNOSIS — R1011 Right upper quadrant pain: Secondary | ICD-10-CM | POA: Diagnosis present

## 2018-02-16 DIAGNOSIS — K760 Fatty (change of) liver, not elsewhere classified: Secondary | ICD-10-CM | POA: Insufficient documentation

## 2018-02-23 ENCOUNTER — Ambulatory Visit: Payer: Self-pay | Admitting: Family Medicine

## 2018-02-23 ENCOUNTER — Encounter: Payer: Self-pay | Admitting: Family Medicine

## 2018-02-23 VITALS — BP 112/70 | HR 74 | Temp 97.7°F | Resp 16 | Ht 67.0 in | Wt 260.0 lb

## 2018-02-23 DIAGNOSIS — Z008 Encounter for other general examination: Secondary | ICD-10-CM

## 2018-02-23 DIAGNOSIS — Z0189 Encounter for other specified special examinations: Principal | ICD-10-CM

## 2018-02-23 NOTE — Progress Notes (Signed)
Subjective: Annual biometrics screening  Patient presents for their annual biometric screening. Patient has a history of hypertension and anxiety, both of which are well controlled according to the patient.  Patient's primary care provider is Dr. Ernie Hew in [place].  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   Patient is fasting today. Patient reports completing biometric form at health department. Patient denies any other issues or concerns.   Review of Systems Constitutional: Unremarkable.  HEENT: Unremarkable  Respiratory: Unremarkable.   Cardiovascular: Unremarkable. ROS otherwise negative.   Objective  Physical Exam General: Awake, alert and oriented. No acute distress. Well developed, hydrated and nourished. Appears stated age.  HEENT: Supple neck without adenopathy. Sclera is non-icteric. The ear canal is clear without discharge. The tympanic membrane is normal in appearance with normal landmarks and cone of light. Nasal mucosa is pink and moist. Oral mucosa is pink and moist. The pharynx is normal in appearance without tonsillar swelling or exudates.  Skin: Skin in warm, dry and intact without rashes or lesions. Appropriate color for ethnicity. Cardiac: Heart rate and rhythm are normal. No murmurs, gallops, or rubs are auscultated.  Respiratory: The chest wall is symmetric and without deformity. No signs of respiratory distress. Lung sounds are clear in all lobes bilaterally without rales, ronchi, or wheezes.  Neurological: The patient is awake, alert and oriented to person, place, and time with normal speech.  Memory is normal and thought processes intact. No gait abnormalities are appreciated.  Psychiatric: Appropriate mood and affect.   Assessment Annual biometrics screen  Plan  Labs pending. Encouraged routine visits with primary care provider.

## 2018-02-24 LAB — CMP12+LP+TP+TSH+6AC+CBC/D/PLT
ALT: 23 IU/L (ref 0–32)
AST: 19 IU/L (ref 0–40)
Albumin/Globulin Ratio: 1.4 (ref 1.2–2.2)
Albumin: 3.9 g/dL (ref 3.5–5.5)
Alkaline Phosphatase: 44 IU/L (ref 39–117)
BUN/Creatinine Ratio: 18 (ref 9–23)
BUN: 14 mg/dL (ref 6–20)
Basophils Absolute: 0 10*3/uL (ref 0.0–0.2)
Basos: 0 %
Bilirubin Total: <0.2 mg/dL (ref 0.0–1.2)
Calcium: 8.8 mg/dL (ref 8.7–10.2)
Chloride: 104 mmol/L (ref 96–106)
Chol/HDL Ratio: 3.4 ratio (ref 0.0–4.4)
Cholesterol, Total: 126 mg/dL (ref 100–199)
Creatinine, Ser: 0.79 mg/dL (ref 0.57–1.00)
EOS (ABSOLUTE): 0.3 10*3/uL (ref 0.0–0.4)
Eos: 3 %
Estimated CHD Risk: 0.5 times avg. (ref 0.0–1.0)
Free Thyroxine Index: 1.5 (ref 1.2–4.9)
GFR calc Af Amer: 115 mL/min/{1.73_m2} (ref >59)
GFR calc non Af Amer: 99 mL/min/{1.73_m2} (ref >59)
GGT: 18 IU/L (ref 0–60)
Globulin, Total: 2.8 g/dL (ref 1.5–4.5)
Glucose: 82 mg/dL (ref 65–99)
HDL: 37 mg/dL — ABNORMAL LOW (ref >39)
Hematocrit: 38.8 % (ref 34.0–46.6)
Hemoglobin: 12.8 g/dL (ref 11.1–15.9)
Immature Grans (Abs): 0.1 10*3/uL (ref 0.0–0.1)
Immature Granulocytes: 1 %
Iron: 40 ug/dL (ref 27–159)
LDH: 390 IU/L — ABNORMAL HIGH (ref 119–226)
LDL Calculated: 76 mg/dL (ref 0–99)
Lymphocytes Absolute: 1.8 10*3/uL (ref 0.7–3.1)
Lymphs: 17 %
MCH: 27.1 pg (ref 26.6–33.0)
MCHC: 33 g/dL (ref 31.5–35.7)
MCV: 82 fL (ref 79–97)
Monocytes Absolute: 0.7 10*3/uL (ref 0.1–0.9)
Monocytes: 7 %
Neutrophils Absolute: 7.3 10*3/uL — ABNORMAL HIGH (ref 1.4–7.0)
Neutrophils: 72 %
Phosphorus: 3.9 mg/dL (ref 2.5–4.5)
Platelets: 301 10*3/uL (ref 150–379)
Potassium: 4.9 mmol/L (ref 3.5–5.2)
RBC: 4.72 x10E6/uL (ref 3.77–5.28)
RDW: 14.8 % (ref 12.3–15.4)
Sodium: 141 mmol/L (ref 134–144)
T3 Uptake Ratio: 25 % (ref 24–39)
T4, Total: 5.9 ug/dL (ref 4.5–12.0)
TSH: 1.96 u[IU]/mL (ref 0.450–4.500)
Total Protein: 6.7 g/dL (ref 6.0–8.5)
Triglycerides: 67 mg/dL (ref 0–149)
Uric Acid: 5 mg/dL (ref 2.5–7.1)
VLDL Cholesterol Cal: 13 mg/dL (ref 5–40)
WBC: 10.1 10*3/uL (ref 3.4–10.8)

## 2018-02-25 NOTE — Progress Notes (Signed)
Will you please call Ms. Wixted and inform her of her lab results?  Inform her that her lactate dehydrogenase result is elevated at 390.  Normal value is between 119 and 226.  This is pretty significantly elevated and she should see her primary care provider within the next week for further evaluation of this abnormal finding.  This value can be elevated with a multitude of conditions, including lungs/heart/kidney/liver/hematologic diseases or from an infection, or can be the result of damage to red blood cells during the blood collection process.  Her HDL ("good cholesterol") is slightly decreased at 37, normal range is over 39.  Her neutrophil count is also slightly elevated at 7.3, normal values between 1.4 and 7.  This may be benign and represent mild viral infection but should also be followed up with her primary care provider to ensure that this returns to normal. If she is having any new symptoms or concerns she should see her primary care provider sooner than that or seek emergency medical care.

## 2018-02-26 ENCOUNTER — Encounter: Payer: Self-pay | Admitting: Internal Medicine

## 2018-02-26 ENCOUNTER — Telehealth: Payer: Self-pay | Admitting: Internal Medicine

## 2018-02-26 NOTE — Telephone Encounter (Signed)
Copied from [place]. Topic: Quick Communication - See Telephone Encounter >> Feb 26, 2018  9:34 AM Robina Ade, Helene Kelp D wrote: CRM for notification. See Telephone encounter for: 02/26/18. Patient called and said that she went to have a [place] at the [facility] per her job. They called her back with her lab results and they were not good and they told her that she needed to be seen sooner with a PCP in the next week. Patient has an appt until April and wants to know if she can be worked in sooner. The labs that were bad were LDH and is was 390. And Neutrophils Absolute 7.3. Please call patient back if she can be worked in sooner, thanks.

## 2018-02-27 ENCOUNTER — Encounter: Payer: Self-pay | Admitting: Family Medicine

## 2018-02-27 ENCOUNTER — Telehealth: Payer: Self-pay | Admitting: Internal Medicine

## 2018-02-27 NOTE — Telephone Encounter (Signed)
This is nonspecific lab finding that could be elevated for several reasons  I dont have availability she can call daily for cancellations with another provider or keep her appt as scheduled with me  [place]

## 2018-02-27 NOTE — Telephone Encounter (Signed)
Spoke with patient she states she was contacted via my chart by Letitia Libra NP in regards to labwork.   See in [place]  Has appointment to establish on 04/02/18

## 2018-02-27 NOTE — Telephone Encounter (Signed)
Copied from [place]. Topic: Quick Communication - Office Called Patient >> Feb 27, 2018  3:03 PM Johna Sheriff, CMA wrote: Reason for CRM: left message to call  >> Feb 27, 2018  4:25 PM Cleaster Corin, [place] wrote: Pt. Calling back wanting to speak with [facility]

## 2018-02-27 NOTE — Telephone Encounter (Signed)
Patient is calling back in regards to this. Requesting the nurse or Dr. Aundra Dubin to contact her. Please advise.  [phone]

## 2018-02-27 NOTE — Telephone Encounter (Signed)
See other phone message  

## 2018-02-27 NOTE — Telephone Encounter (Signed)
Left message to return call 

## 2018-03-02 NOTE — Telephone Encounter (Signed)
Patient called and spoke with the [place] in our office [place] this afternoon to say that she spoke with her primary care provider's office who provided her with advice regarding follow up for her abnormal lab results.

## 2018-03-04 NOTE — Telephone Encounter (Signed)
[place] message sent.

## 2018-03-04 NOTE — Telephone Encounter (Signed)
Patient aware.

## 2018-03-06 ENCOUNTER — Other Ambulatory Visit: Payer: Self-pay

## 2018-03-23 ENCOUNTER — Encounter: Payer: Self-pay | Admitting: Internal Medicine

## 2018-03-27 ENCOUNTER — Other Ambulatory Visit: Payer: Self-pay | Admitting: Family Medicine

## 2018-03-27 DIAGNOSIS — Z20828 Contact with and (suspected) exposure to other viral communicable diseases: Secondary | ICD-10-CM

## 2018-03-27 MED ORDER — OSELTAMIVIR PHOSPHATE 75 MG PO CAPS: 75.0000 mg | ORAL_CAPSULE | Freq: Every day | ORAL | 0 refills | Status: AC

## 2018-03-27 NOTE — Progress Notes (Signed)
Patient called to say that household member was just diagnosed with the flu and was requesting a prescription for Tamiflu.  Confirmed that patient is not allergic to this and that she is only currently taking Wellbutrin and lisinopril.  Advised patient to be counseled in full by the pharmacist regarding safety/adverse/side/warning of this medication.

## 2018-04-02 ENCOUNTER — Ambulatory Visit: Payer: Managed Care, Other (non HMO) | Admitting: Internal Medicine

## 2018-04-02 ENCOUNTER — Other Ambulatory Visit: Payer: Self-pay | Admitting: Internal Medicine

## 2018-04-02 ENCOUNTER — Encounter: Payer: Self-pay | Admitting: Internal Medicine

## 2018-04-02 ENCOUNTER — Other Ambulatory Visit: Payer: Self-pay

## 2018-04-02 VITALS — BP 112/62 | HR 81 | Temp 98.3°F | Ht 67.0 in | Wt 257.0 lb

## 2018-04-02 DIAGNOSIS — R42 Dizziness and giddiness: Secondary | ICD-10-CM

## 2018-04-02 DIAGNOSIS — Z803 Family history of malignant neoplasm of breast: Secondary | ICD-10-CM | POA: Insufficient documentation

## 2018-04-02 DIAGNOSIS — R7402 Elevation of levels of lactic acid dehydrogenase (LDH): Secondary | ICD-10-CM

## 2018-04-02 DIAGNOSIS — K8020 Calculus of gallbladder without cholecystitis without obstruction: Secondary | ICD-10-CM

## 2018-04-02 DIAGNOSIS — Z0184 Encounter for antibody response examination: Secondary | ICD-10-CM

## 2018-04-02 DIAGNOSIS — F419 Anxiety disorder, unspecified: Secondary | ICD-10-CM

## 2018-04-02 DIAGNOSIS — K760 Fatty (change of) liver, not elsewhere classified: Secondary | ICD-10-CM

## 2018-04-02 DIAGNOSIS — A048 Other specified bacterial intestinal infections: Secondary | ICD-10-CM

## 2018-04-02 DIAGNOSIS — Z1159 Encounter for screening for other viral diseases: Secondary | ICD-10-CM

## 2018-04-02 DIAGNOSIS — Z6841 Body Mass Index (BMI) 40.0 and over, adult: Secondary | ICD-10-CM | POA: Diagnosis not present

## 2018-04-02 DIAGNOSIS — Z1389 Encounter for screening for other disorder: Secondary | ICD-10-CM

## 2018-04-02 DIAGNOSIS — N920 Excessive and frequent menstruation with regular cycle: Secondary | ICD-10-CM | POA: Insufficient documentation

## 2018-04-02 DIAGNOSIS — R740 Nonspecific elevation of levels of transaminase and lactic acid dehydrogenase [LDH]: Principal | ICD-10-CM

## 2018-04-02 DIAGNOSIS — I10 Essential (primary) hypertension: Secondary | ICD-10-CM

## 2018-04-02 HISTORY — DX: Fatty (change of) liver, not elsewhere classified: K76.0

## 2018-04-02 HISTORY — DX: Calculus of gallbladder without cholecystitis without obstruction: K80.20

## 2018-04-02 HISTORY — DX: Family history of malignant neoplasm of breast: Z80.3

## 2018-04-02 HISTORY — DX: Anxiety disorder, unspecified: F41.9

## 2018-04-02 HISTORY — DX: Excessive and frequent menstruation with regular cycle: N92.0

## 2018-04-02 MED ORDER — LISINOPRIL 10 MG PO TABS: 10.0000 mg | ORAL_TABLET | Freq: Every day | ORAL | 1 refills | Status: DC

## 2018-04-02 NOTE — Patient Instructions (Addendum)
Meditation apps for phone Insight timer, calm, headspace Reduce lisinopril to 10 mg daily  Ask about my myriad testing  Remember vaccination records    Generalized Anxiety Disorder, Adult Generalized anxiety disorder (GAD) is a mental health disorder. People with this condition constantly worry about everyday events. Unlike normal anxiety, worry related to GAD is not triggered by a specific event. These worries also do not fade or get better with time. GAD interferes with life functions, including relationships, work, and school. GAD can vary from mild to severe. People with severe GAD can have intense waves of anxiety with physical symptoms (panic attacks). What are the causes? The exact cause of GAD is not known. What increases the risk? This condition is more likely to develop in:  Women.  People who have a family history of anxiety disorders.  People who are very shy.  People who experience very stressful life events, such as the death of a loved one.  People who have a very stressful family environment.  What are the signs or symptoms? People with GAD often worry excessively about many things in their lives, such as their health and family. They may also be overly concerned about:  Doing well at work.  Being on time.  Natural disasters.  Friendships.  Physical symptoms of GAD include:  Fatigue.  Muscle tension or having muscle twitches.  Trembling or feeling shaky.  Being easily startled.  Feeling like your heart is pounding or racing.  Feeling out of breath or like you cannot take a deep breath.  Having trouble falling asleep or staying asleep.  Sweating.  Nausea, diarrhea, or irritable bowel syndrome (IBS).  Headaches.  Trouble concentrating or remembering facts.  Restlessness.  Irritability.  How is this diagnosed? Your health care provider can diagnose GAD based on your symptoms and medical history. You will also have a physical exam. The  health care provider will ask specific questions about your symptoms, including how severe they are, when they started, and if they come and go. Your health care provider may ask you about your use of alcohol or drugs, including prescription medicines. Your health care provider may refer you to a mental health specialist for further evaluation. Your health care provider will do a thorough examination and may perform additional tests to rule out other possible causes of your symptoms. To be diagnosed with GAD, a person must have anxiety that:  Is out of his or her control.  Affects several different aspects of his or her life, such as work and relationships.  Causes distress that makes him or her unable to take part in normal activities.  Includes at least three physical symptoms of GAD, such as restlessness, fatigue, trouble concentrating, irritability, muscle tension, or sleep problems.  Before your health care provider can confirm a diagnosis of GAD, these symptoms must be present more days than they are not, and they must last for six months or longer. How is this treated? The following therapies are usually used to treat GAD:  Medicine. Antidepressant medicine is usually prescribed for long-term daily control. Antianxiety medicines may be added in severe cases, especially when panic attacks occur.  Talk therapy (psychotherapy). Certain types of talk therapy can be helpful in treating GAD by providing support, education, and guidance. Options include: ? Cognitive behavioral therapy (CBT). People learn coping skills and techniques to ease their anxiety. They learn to identify unrealistic or negative thoughts and behaviors and to replace them with positive ones. ? Acceptance and commitment  therapy (ACT). This treatment teaches people how to be mindful as a way to cope with unwanted thoughts and feelings. ? Biofeedback. This process trains you to manage your body's response (physiological  response) through breathing techniques and relaxation methods. You will work with a therapist while machines are used to monitor your physical symptoms.  Stress management techniques. These include yoga, meditation, and exercise.  A mental health specialist can help determine which treatment is best for you. Some people see improvement with one type of therapy. However, other people require a combination of therapies. Follow these instructions at home:  Take over-the-counter and prescription medicines only as told by your health care provider.  Try to maintain a normal routine.  Try to anticipate stressful situations and allow extra time to manage them.  Practice any stress management or self-calming techniques as taught by your health care provider.  Do not punish yourself for setbacks or for not making progress.  Try to recognize your accomplishments, even if they are small.  Keep all follow-up visits as told by your health care provider. This is important. Contact a health care provider if:  Your symptoms do not get better.  Your symptoms get worse.  You have signs of depression, such as: ? A persistently sad, cranky, or irritable mood. ? Loss of enjoyment in activities that used to bring you joy. ? Change in weight or eating. ? Changes in sleeping habits. ? Avoiding friends or family members. ? Loss of energy for normal tasks. ? Feelings of guilt or worthlessness. Get help right away if:  You have serious thoughts about hurting yourself or others. If you ever feel like you may hurt yourself or others, or have thoughts about taking your own life, get help right away. You can go to your nearest emergency department or call:  Your local emergency services (911 in the [place]  A suicide crisis helpline, such as the [place] at (340)307-4583. This is open 24 hours a day.  Summary  Generalized anxiety disorder (GAD) is a mental health disorder  that involves worry that is not triggered by a specific event.  People with GAD often worry excessively about many things in their lives, such as their health and family.  GAD may cause physical symptoms such as restlessness, trouble concentrating, sleep problems, frequent sweating, nausea, diarrhea, headaches, and trembling or muscle twitching.  A mental health specialist can help determine which treatment is best for you. Some people see improvement with one type of therapy. However, other people require a combination of therapies. This information is not intended to replace advice given to you by your health care provider. Make sure you discuss any questions you have with your health care provider. Document Released: 03/29/2013 Document Revised: 10/22/2016 Document Reviewed: 10/22/2016 Elsevier Interactive Patient Education  2018 [employer].  Menorrhagia Menorrhagia is a condition in which menstrual periods are heavy or last longer than normal. With menorrhagia, most periods a woman has may cause enough blood loss and cramping that she becomes unable to take part in her usual activities. What are the causes? Common causes of this condition include:  Noncancerous growths in the uterus (polyps or fibroids).  An imbalance of the estrogen and progesterone hormones.  One of the ovaries not releasing an egg during one or more months.  A problem with the thyroid gland (hypothyroid).  Side effects of having an intrauterine device (IUD).  Side effects of some medicines, such as anti-inflammatory medicines or blood thinners.  A bleeding disorder that stops the blood from clotting normally.  In some cases, the cause of this condition is not known. What are the signs or symptoms? Symptoms of this condition include:  Routinely having to change your pad or tampon every 1-2 hours because it is completely soaked.  Needing to use pads and tampons at the same time because of heavy  bleeding.  Needing to wake up to change your pads or tampons during the night.  Passing blood clots larger than 1 inch (2.5 cm) in size.  Having bleeding that lasts for more than 7 days.  Having symptoms of low iron levels (anemia), such as tiredness, fatigue, or shortness of breath.  How is this diagnosed? This condition may be diagnosed based on:  A physical exam.  Your symptoms and menstrual history.  Tests, such as: ? Blood tests to check if you are pregnant or have hormonal changes, a bleeding or thyroid disorder, anemia, or other problems. ? Pap test to check for cancerous changes, infections, or inflammation. ? Endometrial biopsy. This test involves removing a tissue sample from the lining of the uterus (endometrium) to be examined under a microscope. ? Pelvic ultrasound. This test uses sound waves to create images of your uterus, ovaries, and vagina. The images can show if you have fibroids or other growths. ? Hysteroscopy. For this test, a small telescope is used to look inside your uterus.  How is this treated? Treatment may not be needed for this condition. If it is needed, the best treatment for you will depend on:  Whether you need to prevent pregnancy.  Your desire to have children in the future.  The cause and severity of your bleeding.  Your personal preference.  Medicines are the first step in treatment. You may be treated with:  Hormonal birth control methods. These treatments reduce bleeding during your menstrual period. They include: ? Birth control pills. ? Skin patch. ? Vaginal ring. ? Shots (injections) that you get every 3 months. ? Hormonal IUD (intrauterine device). ? Implants that go under the skin.  Medicines that thicken blood and slow bleeding.  Medicines that reduce swelling, such as ibuprofen.  Medicines that contain an artificial (synthetic) hormone called progestin.  Medicines that make the ovaries stop working for a short  time.  Iron supplements to treat anemia.  If medicines do not work, surgery may be done. Surgical options may include:  Dilation and curettage (D&C). In this procedure, your health care provider opens (dilates) your cervix and then scrapes or suctions tissue from the endometrium to reduce menstrual bleeding.  Operative hysteroscopy. In this procedure, a small tube with a light on the end (hysteroscope) is used to view your uterus and help remove polyps that may be causing heavy periods.  Endometrial ablation. This is when various techniques are used to permanently destroy your entire endometrium. After endometrial ablation, most women have little or no menstrual flow. This procedure reduces your ability to become pregnant.  Endometrial resection. In this procedure, an electrosurgical wire loop is used to remove the endometrium. This procedure reduces your ability to become pregnant.  Hysterectomy. This is surgical removal of the uterus. This is a permanent procedure that stops menstrual periods. Pregnancy is not possible after a hysterectomy.  Follow these instructions at home: Medicines  Take over-the-counter and prescription medicines exactly as told by your health care provider. This includes iron pills.  Do not change or switch medicines without asking your health care provider.  Do not  take aspirin or medicines that contain aspirin 1 week before or during your menstrual period. Aspirin may make bleeding worse. General instructions  If you need to change your sanitary pad or tampon more than once every 2 hours, limit your activity until the bleeding stops.  Iron pills can cause constipation. To prevent or treat constipation while you are taking prescription iron supplements, your health care provider may recommend that you: ? Drink enough fluid to keep your urine clear or pale yellow. ? Take over-the-counter or prescription medicines. ? Eat foods that are high in fiber, such as  fresh fruits and vegetables, whole grains, and beans. ? Limit foods that are high in fat and processed sugars, such as fried and sweet foods.  Eat well-balanced meals, including foods that are high in iron. Foods that have a lot of iron include leafy green vegetables, meat, liver, eggs, and whole grain breads and cereals.  Do not try to lose weight until the abnormal bleeding has stopped and your blood iron level is back to normal. If you need to lose weight, work with your health care provider to lose weight safely.  Keep all follow-up visits as told by your health care provider. This is important. Contact a health care provider if:  You soak through a pad or tampon every 1 or 2 hours, and this happens every time you have a period.  You need to use pads and tampons at the same time because you are bleeding so much.  You have nausea, vomiting, diarrhea, or other problems related to medicines you are taking. Get help right away if:  You soak through more than a pad or tampon in 1 hour.  You pass clots bigger than 1 inch (2.5 cm) wide.  You feel short of breath.  You feel like your heart is beating too fast.  You feel dizzy or faint.  You feel very weak or tired. Summary  Menorrhagia is a condition in which menstrual periods are heavy or last longer than normal.  Treatment will depend on the cause of the condition and may include medicines or procedures.  Take over-the-counter and prescription medicines exactly as told by your health care provider. This includes iron pills.  Get help right away if you have heavy bleeding that soaks through more than a pad or tampon in 1 hour, you are passing large clots, or you feel dizzy, faint or short of breath. This information is not intended to replace advice given to you by your health care provider. Make sure you discuss any questions you have with your health care provider. Document Released: 12/02/2005 Document Revised: 11/25/2016  Document Reviewed: 11/25/2016 Elsevier Interactive Patient Education  2018 [employer].  Fatty Liver Fatty liver, also called hepatic steatosis or steatohepatitis, is a condition in which too much fat has built up in your liver cells. The liver removes harmful substances from your bloodstream. It produces fluids your body needs. It also helps your body use and store energy from the food you eat. In many cases, fatty liver does not cause symptoms or problems. It is often diagnosed when tests are being done for other reasons. However, over time, fatty liver can cause inflammation that may lead to more serious liver problems, such as scarring of the liver (cirrhosis). What are the causes? Causes of fatty liver may include:  Drinking too much alcohol.  Poor nutrition.  Obesity.  Cushing syndrome.  Diabetes.  Hyperlipidemia.  Pregnancy.  Certain drugs.  Poisons.  Some viral  infections.  What increases the risk? You may be more likely to develop fatty liver if you:  Abuse alcohol.  Are pregnant.  Are overweight.  Have diabetes.  Have hepatitis.  Have a high triglyceride level.  What are the signs or symptoms? Fatty liver often does not cause any symptoms. In cases where symptoms develop, they can include:  Fatigue.  Weakness.  Weight loss.  Confusion.  Abdominal pain.  Yellowing of your skin and the white parts of your eyes (jaundice).  Nausea and vomiting.  How is this diagnosed? Fatty liver may be diagnosed by:  Physical exam and medical history.  Blood tests.  Imaging tests, such as an ultrasound, CT scan, or MRI.  Liver biopsy. A small sample of liver tissue is removed using a needle. The sample is then looked at under a microscope.  How is this treated? Fatty liver is often caused by other health conditions. Treatment for fatty liver may involve medicines and lifestyle changes to manage conditions such as:  Alcoholism.  High  cholesterol.  Diabetes.  Being overweight or obese.  Follow these instructions at home:  Eat a healthy diet as directed by your health care provider.  Exercise regularly. This can help you lose weight and control your cholesterol and diabetes. Talk to your health care provider about an exercise plan and which activities are best for you.  Do not drink alcohol.  Take medicines only as directed by your health care provider. Contact a health care provider if: You have difficulty controlling your:  Blood sugar.  Cholesterol.  Alcohol consumption.  Get help right away if:  You have abdominal pain.  You have jaundice.  You have nausea and vomiting. This information is not intended to replace advice given to you by your health care provider. Make sure you discuss any questions you have with your health care provider. Document Released: 01/17/2006 Document Revised: 05/09/2016 Document Reviewed: 04/13/2014 Elsevier Interactive Patient Education  2018 [employer].  Cholelithiasis Cholelithiasis is a form of gallbladder disease in which gallstones form in the gallbladder. The gallbladder is an organ that stores bile. Bile is made in the liver, and it helps to digest fats. Gallstones begin as small crystals and slowly grow into stones. They may cause no symptoms until the gallbladder tightens (contracts) and a gallstone is blocking the duct (gallbladder attack), which can cause pain. Cholelithiasis is also referred to as gallstones. There are two main types of gallstones:  Cholesterol stones. These are made of hardened cholesterol and are usually yellow-green in color. They are the most common type of gallstone. Cholesterol is a white, waxy, fat-like substance that is made in the liver.  Pigment stones. These are dark in color and are made of a red-yellow substance that forms when hemoglobin from red blood cells breaks down (bilirubin).  What are the causes? This condition may be  caused by an imbalance in the substances that bile is made of. This can happen if the bile:  Has too much bilirubin.  Has too much cholesterol.  Does not have enough bile salts. These salts help the body absorb and digest fats.  In some cases, this condition can also be caused by the gallbladder not emptying completely or often enough. What increases the risk? The following factors may make you more likely to develop this condition:  Being female.  Having multiple pregnancies. Health care providers sometimes advise removing diseased gallbladders before future pregnancies.  Eating a diet that is heavy in fried foods,  fat, and refined carbohydrates, like white bread and white rice.  Being obese.  Being older than age 18.  Prolonged use of medicines that contain female hormones (estrogen).  Having diabetes mellitus.  Rapidly losing weight.  Having a family history of gallstones.  Being of [place] or [place] descent.  Having an intestinal disease such as Crohn disease.  Having metabolic syndrome.  Having cirrhosis.  Having severe types of anemia such as sickle cell anemia.  What are the signs or symptoms? In most cases, there are no symptoms. These are known as silent gallstones. If a gallstone blocks the bile ducts, it can cause a gallbladder attack. The main symptom of a gallbladder attack is sudden pain in the upper right abdomen. The pain usually comes at night or after eating a large meal. The pain can last for one or several hours and can spread to the right shoulder or chest. If the bile duct is blocked for more than a few hours, it can cause infection or inflammation of the gallbladder, liver, or pancreas, which may cause:  Nausea.  Vomiting.  Abdominal pain that lasts for 5 hours or more.  Fever or chills.  Yellowing of the skin or the whites of the eyes (jaundice).  Dark urine.  Light-colored stools.  How is this diagnosed? This condition may  be diagnosed based on:  A physical exam.  Your medical history.  An ultrasound of your gallbladder.  CT scan.  MRI.  Blood tests to check for signs of infection or inflammation.  A scan of your gallbladder and bile ducts (biliary system) using nonharmful radioactive material and special cameras that can see the radioactive material (cholescintigram). This test checks to see how your gallbladder contracts and whether bile ducts are blocked.  Inserting a small tube with a camera on the end (endoscope) through your mouth to inspect bile ducts and check for blockages (endoscopic retrograde cholangiopancreatogram).  How is this treated? Treatment for gallstones depends on the severity of the condition. Silent gallstones do not need treatment. If the gallstones cause a gallbladder attack or other symptoms, treatment may be required. Options for treatment include:  Surgery to remove the gallbladder (cholecystectomy). This is the most common treatment.  Medicines to dissolve gallstones. These are most effective at treating small gallstones. You may need to take medicines for up to 6-12 months.  Shock wave treatment (extracorporeal biliary lithotripsy). In this treatment, an ultrasound machine sends shock waves to the gallbladder to break gallstones into smaller pieces. These pieces can then be passed into the intestines or be dissolved by medicine. This is rarely used.  Removing gallstones through endoscopic retrograde cholangiopancreatogram. A small basket can be attached to the endoscope and used to capture and remove gallstones.  Follow these instructions at home:  Take over-the-counter and prescription medicines only as told by your health care provider.  Maintain a healthy weight and follow a healthy diet. This includes: ? Reducing fatty foods, such as fried food. ? Reducing refined carbohydrates, like white bread and white rice. ? Increasing fiber. Aim for foods like almonds, fruit,  and beans.  Keep all follow-up visits as told by your health care provider. This is important. Contact a health care provider if:  You think you have had a gallbladder attack.  You have been diagnosed with silent gallstones and you develop abdominal pain or indigestion. Get help right away if:  You have pain from a gallbladder attack that lasts for more than 2 hours.  You  have abdominal pain that lasts for more than 5 hours.  You have a fever or chills.  You have persistent nausea and vomiting.  You develop jaundice.  You have dark urine or light-colored stools. Summary  Cholelithiasis (also called gallstones) is a form of gallbladder disease in which gallstones form in the gallbladder.  This condition is caused by an imbalance in the substances that make up bile. This can happen if the bile has too much cholesterol, too much bilirubin, or not enough bile salts.  You are more likely to develop this condition if you are female, pregnant, using medicines with estrogen, obese, older than age 65, or have a family history of gallstones. You may also develop gallstones if you have diabetes, an intestinal disease, cirrhosis, or metabolic syndrome.  Treatment for gallstones depends on the severity of the condition. Silent gallstones do not need treatment.  If gallstones cause a gallbladder attack or other symptoms, treatment may be needed. The most common treatment is surgery to remove the gallbladder. This information is not intended to replace advice given to you by your health care provider. Make sure you discuss any questions you have with your health care provider. Document Released: 11/28/2005 Document Revised: 08/18/2016 Document Reviewed: 08/18/2016 Elsevier Interactive Patient Education  2018 [employer].

## 2018-04-02 NOTE — Progress Notes (Signed)
Pre visit review using our clinic review tool, if applicable. No additional management support is needed unless otherwise documented below in the visit note. 

## 2018-04-03 ENCOUNTER — Encounter: Payer: Self-pay | Admitting: Internal Medicine

## 2018-04-03 LAB — URINALYSIS
Bilirubin, UA: NEGATIVE
Glucose, UA: NEGATIVE
Ketones, UA: NEGATIVE
Leukocytes, UA: NEGATIVE
Nitrite, UA: NEGATIVE
Protein, UA: NEGATIVE
RBC, UA: NEGATIVE
Specific Gravity, UA: 1.018 (ref 1.005–1.030)
Urobilinogen, Ur: 0.2 mg/dL (ref 0.2–1.0)
pH, UA: 6 (ref 5.0–7.5)

## 2018-04-03 NOTE — Progress Notes (Addendum)
Chief Complaint  Patient presents with  . New Patient (Initial Visit)   New patient  1. HTN on Lis 20 mg qd. BP controlled she does report dizziness at times   2. C/o heavy menses changing tampons every 30 minutes  FH breast cancer in mother she tested negative for BRCA but is interested in other genetic testing  -will refer to [facility] she prefers a woman   3. She had work labs and LDH was elevated reviewed causes could be extensive and nonspecific will repeat today   4. C/o RUQ intermittent upper abdominal pain severe at times radiates to right mid ab associated with nausea but not vomiting worse with foods and she does not want to eat at times.  Former PCP she reports would not refer her for surgery to get gallstones removed. She wants referral  -[place] 02/16/18 numerous gallstones and fatty liver   5. Anxiety-she stopped prozac but is was working, Lexapro was not working and gave her increased anxiety and she at one time was on Wellbutrin XL 150 but when lexapro was stopped former PCP increased Wellbutrin to 300. She is c/w SSRI class causing sexual side effects. She is in therapy also disc meditation apps for phone  6. Obesity she started walking 30 minutes 2x per day.   Review of Systems  Constitutional: Positive for weight loss.  HENT: Negative for hearing loss.   Eyes: Negative for blurred vision.  Respiratory: Negative for shortness of breath.   Cardiovascular: Negative for chest pain.  Gastrointestinal: Positive for abdominal pain and nausea. Negative for vomiting.  Genitourinary:       +heavy menses   Skin: Negative for rash.  Neurological: Negative for headaches.  Psychiatric/Behavioral: The patient is nervous/anxious.    Past Medical History:  Diagnosis Date  . Anxiety   . BRCA negative   . Depression   . Family history of anesthesia complication     mother had Post -op nausea and dizziness.  Marland Kitchen GERD (gastroesophageal reflux disease)   . Headache   . Hypertension   .  Kidney stones   . Pseudotumor cerebri 2009   Past Surgical History:  Procedure Laterality Date  . CESAREAN SECTION     2009/2017  . LUMBAR LAMINECTOMY/DECOMPRESSION MICRODISCECTOMY Left 09/22/2014   Procedure: Left Lumbar four-five microdiskectomy;  Surgeon: Consuella Lose, MD;  Location: [facility];  Service: Neurosurgery;  Laterality: Left;  Left Lumbar four-five microdiskectomy   Family History  Problem Relation Age of Onset  . Cancer Mother        breast dx'ed age 89/49  . Hypertension Mother   . Diabetes Mother   . Hypothyroidism Mother   . Depression Mother   . Liver disease Father   . Heart attack Father   . Arthritis Father   . Alcohol abuse Father   . Depression Father   . Drug abuse Father   . Early death Father   . Heart disease Father   . Hypertension Father   . Depression Sister   . Hypertension Sister   . Arthritis Sister   . Hypertension Sister   . Cancer Maternal Grandmother        stomach>liver>brain met  . Cancer Maternal Uncle        lung not a smoker    Social History   Socioeconomic History  . Marital status: Married    Spouse name: Not on file  . Number of children: Not on file  . Years of education: Not on  file  . Highest education level: Not on file  Occupational History  . Not on file  Social Needs  . Financial resource strain: Not on file  . Food insecurity:    Worry: Not on file    Inability: Not on file  . Transportation needs:    Medical: Not on file    Non-medical: Not on file  Tobacco Use  . Smoking status: Former Smoker    Types: Cigarettes  . Smokeless tobacco: Never Used  Substance and Sexual Activity  . Alcohol use: No    Comment: " occasionally"  . Drug use: No  . Sexual activity: Yes    Birth control/protection: Other-see comments, Rhythm    Comment: withdrawl  Lifestyle  . Physical activity:    Days per week: Not on file    Minutes per session: Not on file  . Stress: Not on file  Relationships  . Social  connections:    Talks on phone: Not on file    Gets together: Not on file    Attends religious service: Not on file    Active member of club or organization: Not on file    Attends meetings of clubs or organizations: Not on file    Relationship status: Not on file  . Intimate partner violence:    Fear of current or ex partner: Not on file    Emotionally abused: Not on file    Physically abused: Not on file    Forced sexual activity: Not on file  Other Topics Concern  . Not on file  Social History Narrative   High school works at [employer]   Married with kids     Owns guns   Wears seat belt   Safe in relationship    Current Meds  Medication Sig  . buPROPion (WELLBUTRIN XL) 300 MG 24 hr tablet Take 300 mg by mouth daily.  Marland Kitchen lisinopril (PRINIVIL,ZESTRIL) 10 MG tablet Take 1 tablet (10 mg total) by mouth daily.  . [DISCONTINUED] lisinopril (PRINIVIL,ZESTRIL) 20 MG tablet Take 20 mg by mouth daily.   Allergies  Allergen Reactions  . Amoxicillin Hives, Itching, Swelling and Dermatitis    Hives   . Other Itching and Swelling    Almonds-mouth swells and itches  . Apple Other (See Comments)    Lips itch and swell, inside of mouth gets hives   Recent Results (from the past 2160 hour(s))  Executive Panel     Status: Abnormal   Collection Time: 02/23/18  8:42 AM  Result Value Ref Range   Glucose 82 65 - 99 mg/dL   Uric Acid 5.0 2.5 - 7.1 mg/dL    Comment:            Therapeutic target for gout patients: <6.0   BUN 14 6 - 20 mg/dL   Creatinine, Ser 0.79 0.57 - 1.00 mg/dL   GFR calc non Af Amer 99 >59 mL/min/1.73   GFR calc Af Amer 115 >59 mL/min/1.73   BUN/Creatinine Ratio 18 9 - 23   Sodium 141 134 - 144 mmol/L   Potassium 4.9 3.5 - 5.2 mmol/L   Chloride 104 96 - 106 mmol/L   Calcium 8.8 8.7 - 10.2 mg/dL   Phosphorus 3.9 2.5 - 4.5 mg/dL   Total Protein 6.7 6.0 - 8.5 g/dL   Albumin 3.9 3.5 - 5.5 g/dL   Globulin, Total 2.8 1.5 - 4.5 g/dL   Albumin/Globulin Ratio 1.4 1.2 -  2.2   Bilirubin Total <0.2 0.0 -  1.2 mg/dL   Alkaline Phosphatase 44 39 - 117 IU/L   LDH 390 (H) 119 - 226 IU/L   AST 19 0 - 40 IU/L   ALT 23 0 - 32 IU/L   GGT 18 0 - 60 IU/L   Iron 40 27 - 159 ug/dL   Cholesterol, Total 126 100 - 199 mg/dL   Triglycerides 67 0 - 149 mg/dL   HDL 37 (L) >39 mg/dL   VLDL Cholesterol Cal 13 5 - 40 mg/dL   LDL Calculated 76 0 - 99 mg/dL   Chol/HDL Ratio 3.4 0.0 - 4.4 ratio    Comment:                                   T. Chol/HDL Ratio                                             Men  Women                               1/2 Avg.Risk  3.4    3.3                                   Avg.Risk  5.0    4.4                                2X Avg.Risk  9.6    7.1                                3X Avg.Risk 23.4   11.0    Estimated CHD Risk 0.5 0.0 - 1.0 times avg.    Comment:                                   T. Chol/HDL Ratio                                             Men  Women                               1/2 Avg.Risk  3.4    3.3                                   Avg.Risk  5.0    4.4                                2X Avg.Risk  9.6    7.1                                3X Avg.Risk 23.4  11.0 The CHD Risk is based on the T. Chol/HDL ratio.  Other factors affect CHD Risk such as hypertension, smoking, diabetes, severe obesity, and family history of pre- mature CHD.    TSH 1.960 0.450 - 4.500 uIU/mL   T4, Total 5.9 4.5 - 12.0 ug/dL   T3 Uptake Ratio 25 24 - 39 %   Free Thyroxine Index 1.5 1.2 - 4.9   WBC 10.1 3.4 - 10.8 x10E3/uL   RBC 4.72 3.77 - 5.28 x10E6/uL   Hemoglobin 12.8 11.1 - 15.9 g/dL   Hematocrit 38.8 34.0 - 46.6 %   MCV 82 79 - 97 fL   MCH 27.1 26.6 - 33.0 pg   MCHC 33.0 31.5 - 35.7 g/dL   RDW 14.8 12.3 - 15.4 %   Platelets 301 150 - 379 x10E3/uL   Neutrophils 72 Not Estab. %   Lymphs 17 Not Estab. %   Monocytes 7 Not Estab. %   Eos 3 Not Estab. %   Basos 0 Not Estab. %   Neutrophils Absolute 7.3 (H) 1.4 - 7.0 x10E3/uL   Lymphocytes  Absolute 1.8 0.7 - 3.1 x10E3/uL   Monocytes Absolute 0.7 0.1 - 0.9 x10E3/uL   EOS (ABSOLUTE) 0.3 0.0 - 0.4 x10E3/uL   Basophils Absolute 0.0 0.0 - 0.2 x10E3/uL   Immature Granulocytes 1 Not Estab. %   Immature Grans (Abs) 0.1 0.0 - 0.1 x10E3/uL  Lactate Dehydrogenase (LDH)     Status: None   Collection Time: 04/02/18  3:50 PM  Result Value Ref Range   LDH 215 119 - 226 IU/L  Urinalysis     Status: None   Collection Time: 04/02/18  3:50 PM  Result Value Ref Range   Specific Gravity, UA 1.018 1.005 - 1.030   pH, UA 6.0 5.0 - 7.5   Color, UA Yellow Yellow   Appearance Ur Clear Clear   Leukocytes, UA Negative Negative   Protein, UA Negative Negative/Trace   Glucose, UA Negative Negative   Ketones, UA Negative Negative   RBC, UA Negative Negative   Bilirubin, UA Negative Negative   Urobilinogen, Ur 0.2 0.2 - 1.0 mg/dL   Nitrite, UA Negative Negative  H Pylori, IGM, IGG, IGA AB     Status: None (Preliminary result)   Collection Time: 04/02/18  3:50 PM  Result Value Ref Range   H. pylori, IgG AbS <0.80 0.00 - 0.79 Index Value    Comment:                              Negative           <0.80                              Equivocal    0.80 - 0.89                              Positive           >0.89    H. pylori, IgA Abs WILL FOLLOW    H pylori, IgM Abs <9.0 0.0 - 8.9 units    Comment:                                 Negative          <  9.0                                 Equivocal   9.0 - 11.0                                 Positive         >11.0 This test was developed and its performance characteristics determined by [facility]. It has not been cleared or approved by the Food and Drug Administration.    Objective  Body mass index is 40.25 kg/m. Wt Readings from Last 3 Encounters:  04/02/18 257 lb (116.6 kg)  02/23/18 260 lb (117.9 kg)  08/06/17 261 lb (118.4 kg)   Temp Readings from Last 3 Encounters:  04/02/18 98.3 F (36.8 C) (Oral)  02/23/18 97.7 F (36.5 C) (Oral)   06/25/17 98.5 F (36.9 C) (Oral)   BP Readings from Last 3 Encounters:  04/02/18 112/62  02/23/18 112/70  08/06/17 117/78   Pulse Readings from Last 3 Encounters:  04/02/18 81  02/23/18 74  08/06/17 66    Physical Exam  Constitutional: She is oriented to person, place, and time. Vital signs are normal. She appears well-developed and well-nourished. She is cooperative.  HENT:  Head: Normocephalic and atraumatic.  Mouth/Throat: Oropharynx is clear and moist and mucous membranes are normal.  Eyes: Pupils are equal, round, and reactive to light. Conjunctivae are normal.  Cardiovascular: Normal rate, regular rhythm and normal heart sounds.  Pulmonary/Chest: Effort normal and breath sounds normal.  Abdominal: Soft. Bowel sounds are normal. There is tenderness in the right upper quadrant. There is no rebound and no guarding.  Neurological: She is alert and oriented to person, place, and time. Gait normal.  Skin: Skin is warm, dry and intact.  Psychiatric: She has a normal mood and affect. Her speech is normal and behavior is normal. Judgment and thought content normal. Cognition and memory are normal.  Nursing note and vitals reviewed.   Assessment   1. HTN  2. sx'matic gallstones and epigastric pain with h/o Hpylori  3. Heavy menses, regular LMP 03/25/18  FH breast cancer in mom dx'ed 48/49  4. Anxiety  5. Obesity BMI >40  6. Elevated LDH ? If related to 32  7. HM Plan  1.  With dizziness reduce dose from 20 mg to 10 mg qd lis 2. Refer to Dr. Phoebe Perch  Check H pylori IGM, A, G Order UA  Had CMET, CBC, lipid, TSH, T4 on 02/23/18 reviewed normal    3. Refer to [facility] pt prefers [facility]  Wants further genetic testing breast cancer  Needs pap as well  4. Disc Wellbutrin XL 300 mg only txs depression not anxiety  Pt wants to hold on other SSRI anxiety meds for now  F/u therapy  Disc meditation apps for phone  5. Cont exercise with healthy diet choices  6.  Repeat LDH form given today  7.  Had flu shot this year Check other vaccines in [facility] UTD Tdap per pt  Check hep B status, if not had will need hep A vaccine with fatty liver  HIV 01/04/16 neg   Referred OB[facility]N [facility] for pap and genetics testing breast cancer  [facility]  Dentist Dr. Sidney Ace  Previous PCP Rachell Cipro   Saw [facility] 04/21/18 for gallstones Dr. Barry Dienes rec GB surgery pending  Provider:  Dr. Adhira Jamil McLean-Scocuzza-[facility]  

## 2018-04-04 LAB — H PYLORI, IGM, IGG, IGA AB
H pylori, IgM Abs: <9 units (ref 0.0–8.9)
H. pylori, IgA Abs: <9 units (ref 0.0–8.9)
H. pylori, IgG AbS: <0.8 Index Value (ref 0.00–0.79)

## 2018-04-04 LAB — LACTATE DEHYDROGENASE: LDH: 215 IU/L (ref 119–226)

## 2018-04-07 ENCOUNTER — Telehealth: Payer: Self-pay | Admitting: Obstetrics and Gynecology

## 2018-04-07 NOTE — Telephone Encounter (Signed)
[facility] referring for Heavy menses, needs pap Dr. Fraser Din. Called and left voicemail for patient to call back.

## 2018-04-09 ENCOUNTER — Other Ambulatory Visit: Payer: Self-pay

## 2018-04-09 ENCOUNTER — Encounter: Payer: Self-pay | Admitting: Internal Medicine

## 2018-04-09 DIAGNOSIS — Z1159 Encounter for screening for other viral diseases: Secondary | ICD-10-CM

## 2018-04-09 DIAGNOSIS — Z0184 Encounter for antibody response examination: Secondary | ICD-10-CM

## 2018-04-10 LAB — HEPATITIS B SURFACE ANTIBODY, QUANTITATIVE: Hepatitis B Surf Ab Quant: <3.1 m[IU]/mL — ABNORMAL LOW (ref >9.9)

## 2018-04-14 ENCOUNTER — Encounter (INDEPENDENT_AMBULATORY_CARE_PROVIDER_SITE_OTHER): Payer: Self-pay

## 2018-04-21 ENCOUNTER — Other Ambulatory Visit: Payer: Self-pay | Admitting: General Surgery

## 2018-04-23 ENCOUNTER — Other Ambulatory Visit: Payer: Self-pay | Admitting: Family Medicine

## 2018-04-23 ENCOUNTER — Encounter: Payer: Self-pay | Admitting: Obstetrics and Gynecology

## 2018-04-23 ENCOUNTER — Ambulatory Visit (INDEPENDENT_AMBULATORY_CARE_PROVIDER_SITE_OTHER): Payer: Managed Care, Other (non HMO) | Admitting: Obstetrics and Gynecology

## 2018-04-23 VITALS — BP 128/78 | HR 72 | Ht 67.0 in | Wt 261.0 lb

## 2018-04-23 DIAGNOSIS — Z124 Encounter for screening for malignant neoplasm of cervix: Secondary | ICD-10-CM

## 2018-04-23 DIAGNOSIS — N920 Excessive and frequent menstruation with regular cycle: Secondary | ICD-10-CM

## 2018-04-23 DIAGNOSIS — E559 Vitamin D deficiency, unspecified: Secondary | ICD-10-CM

## 2018-04-23 MED ORDER — MEFENAMIC ACID 250 MG PO CAPS: 500.0000 mg | ORAL_CAPSULE | Freq: Three times a day (TID) | ORAL | 6 refills | Status: DC

## 2018-04-23 NOTE — Progress Notes (Signed)
   Patient ID: Debbie Townsend, female   DOB: 30-Nov-1986, 32 y.o.   MRN: [id number]  Reason for Consult: Referral (menorrhagia)   Referred by McLean-Scocuzza, [name] *  Subjective:     HPI:  Debbie Townsend is a 32 y.o. female. She reports heavy vaginal bleeding.   Past Medical History:  Diagnosis Date  . Anxiety   . BRCA negative   . Depression   . Family history of anesthesia complication     mother had Post -op nausea and dizziness.  Marland Kitchen GERD (gastroesophageal reflux disease)   . Headache   . Hypertension   . Kidney stones   . Pseudotumor cerebri 2007 or 2009   Family History  Problem Relation Age of Onset  . Cancer Mother        breast dx'ed age 20/49  . Hypertension Mother   . Diabetes Mother   . Hypothyroidism Mother   . Depression Mother   . Liver disease Father   . Heart attack Father   . Arthritis Father   . Alcohol abuse Father   . Depression Father   . Drug abuse Father   . Early death Father   . Heart disease Father   . Hypertension Father   . Depression Sister   . Hypertension Sister   . Arthritis Sister   . Hypertension Sister   . Cancer Maternal Grandmother        stomach>liver>brain met  . Cancer Maternal Uncle        lung not a smoker    Past Surgical History:  Procedure Laterality Date  . CESAREAN SECTION     2009/2017  . LUMBAR LAMINECTOMY/DECOMPRESSION MICRODISCECTOMY Left 09/22/2014   Procedure: Left Lumbar four-five microdiskectomy;  Surgeon: Consuella Lose, MD;  Location: [facility];  Service: Neurosurgery;  Laterality: Left;  Left Lumbar four-five microdiskectomy    Short Social History:  Social History   Tobacco Use  . Smoking status: Former Smoker    Types: Cigarettes  . Smokeless tobacco: Never Used  Substance Use Topics  . Alcohol use: No    Comment: " occasionally"    Allergies  Allergen Reactions  . Amoxicillin Hives, Itching, Swelling and Dermatitis    Hives   . Other Itching and Swelling    Almonds-mouth  swells and itches  . Apple Other (See Comments)    Lips itch and swell, inside of mouth gets hives    Current Outpatient Medications  Medication Sig Dispense Refill  . buPROPion (WELLBUTRIN XL) 300 MG 24 hr tablet Take 300 mg by mouth daily.    Marland Kitchen lisinopril (PRINIVIL,ZESTRIL) 10 MG tablet Take 1 tablet (10 mg total) by mouth daily. 90 tablet 1   No current facility-administered medications for this visit.     REVIEW OF SYSTEMS      Objective:  Objective   Vitals:   04/23/18 0828  BP: 128/78  Pulse: 72  Weight: 261 lb (118.4 kg)  Height: '5\' 7"'$  (1.702 m)   Body mass index is 40.88 kg/m.  Physical Exam       Assessment/Plan:     32 yo with menorrhagia Labs today Pap today Will follow up for pelvic US Lysteda prescribed     [facility] 04/23/18 8:34 AM

## 2018-04-24 LAB — VITAMIN D 25 HYDROXY (VIT D DEFICIENCY, FRACTURES): Vit D, 25-Hydroxy: 50 ng/mL (ref 30.0–100.0)

## 2018-04-24 LAB — PROTIME-INR
INR: 1 (ref 0.8–1.2)
Prothrombin Time: 10.1 s (ref 9.1–12.0)

## 2018-04-24 LAB — APTT: aPTT: 25 s (ref 24–33)

## 2018-04-26 LAB — PAPIG, HPV, RFX 16/18
HPV, high-risk: NEGATIVE
PAP Smear Comment: 0

## 2018-04-27 NOTE — Progress Notes (Signed)
Negative- released to mychart.

## 2018-05-07 ENCOUNTER — Encounter: Payer: Self-pay | Admitting: Internal Medicine

## 2018-06-08 ENCOUNTER — Other Ambulatory Visit: Payer: Managed Care, Other (non HMO)

## 2018-06-08 ENCOUNTER — Ambulatory Visit: Payer: Managed Care, Other (non HMO) | Admitting: Obstetrics and Gynecology

## 2018-06-24 ENCOUNTER — Encounter: Payer: Self-pay | Admitting: *Deleted

## 2018-06-24 ENCOUNTER — Telehealth: Payer: Self-pay | Admitting: Internal Medicine

## 2018-06-24 NOTE — Telephone Encounter (Signed)
Copied from [place] [id number]. 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

## 2018-06-24 NOTE — Telephone Encounter (Signed)
Sent pt a mychart

## 2018-06-24 NOTE — Telephone Encounter (Signed)
Please advise 

## 2018-06-24 NOTE — Telephone Encounter (Signed)
Pt has appt with PCP tomorrow (06/25/18) to discuss medication

## 2018-06-25 ENCOUNTER — Ambulatory Visit: Payer: Managed Care, Other (non HMO) | Admitting: Internal Medicine

## 2018-06-25 ENCOUNTER — Encounter: Payer: Self-pay | Admitting: Internal Medicine

## 2018-06-25 VITALS — BP 106/64 | HR 80 | Temp 98.0°F | Ht 67.0 in | Wt 258.0 lb

## 2018-06-25 DIAGNOSIS — N920 Excessive and frequent menstruation with regular cycle: Secondary | ICD-10-CM

## 2018-06-25 DIAGNOSIS — Z803 Family history of malignant neoplasm of breast: Secondary | ICD-10-CM | POA: Diagnosis not present

## 2018-06-25 DIAGNOSIS — K760 Fatty (change of) liver, not elsewhere classified: Secondary | ICD-10-CM | POA: Diagnosis not present

## 2018-06-25 DIAGNOSIS — K8020 Calculus of gallbladder without cholecystitis without obstruction: Secondary | ICD-10-CM | POA: Diagnosis not present

## 2018-06-25 DIAGNOSIS — F419 Anxiety disorder, unspecified: Secondary | ICD-10-CM | POA: Diagnosis not present

## 2018-06-25 DIAGNOSIS — I10 Essential (primary) hypertension: Secondary | ICD-10-CM | POA: Diagnosis not present

## 2018-06-25 MED ORDER — SERTRALINE HCL 50 MG PO TABS: 50.0000 mg | ORAL_TABLET | Freq: Every day | ORAL | 0 refills | Status: DC

## 2018-06-25 MED ORDER — LISINOPRIL 20 MG PO TABS: 20.0000 mg | ORAL_TABLET | Freq: Every day | ORAL | 3 refills | Status: DC

## 2018-06-25 MED ORDER — ALPRAZOLAM 0.25 MG PO TABS: 0.2500 mg | ORAL_TABLET | Freq: Every day | ORAL | 0 refills | Status: DC | PRN

## 2018-06-25 NOTE — Progress Notes (Signed)
Pre visit review using our clinic review tool, if applicable. No additional management support is needed unless otherwise documented below in the visit note. 

## 2018-06-25 NOTE — Progress Notes (Signed)
Chief Complaint  Patient presents with  . Anxiety   F/u  1. BP improved with lisinopril 20 mg qd  2. Anxiety/panic with new job as Geophysicist/field seismologist. Has had crying spells. Nervous about leaving her kids upcoming work trip will be 1st time away. Had been on prozac 20/40 mg in the past, trazadone 150 mg, and wellbutrin 300. She has prev. Seen therapy. She had old Rx Xanax 0.25 which she has been cutting in 1/2 but having to take them on increased freq.  3. DUB with heavy menses and clotting saw [facility] and no further w/u done and also wants 2nd opinion from Dr. [facility] in [place]  4. Gallstones saw Dr. Barry Dienes who rec GB removal but pt states currently on hold   Review of Systems  Constitutional: Negative for weight loss.  HENT: Negative for hearing loss.   Eyes: Negative for blurred vision.  Respiratory: Negative for shortness of breath.   Cardiovascular: Negative for chest pain.  Genitourinary:       +heavy menses   Skin: Negative for rash.  Psychiatric/Behavioral: The patient is nervous/anxious.    Past Medical History:  Diagnosis Date  . Anxiety   . BRCA negative   . Depression   . Family history of anesthesia complication     mother had Post -op nausea and dizziness.  Marland Kitchen GERD (gastroesophageal reflux disease)   . Headache   . Hypertension   . Kidney stones   . Pseudotumor cerebri 2007 or 2009   Past Surgical History:  Procedure Laterality Date  . CESAREAN SECTION     2009/2017  . LUMBAR LAMINECTOMY/DECOMPRESSION MICRODISCECTOMY Left 09/22/2014   Procedure: Left Lumbar four-five microdiskectomy;  Surgeon: Consuella Lose, MD;  Location: [facility];  Service: Neurosurgery;  Laterality: Left;  Left Lumbar four-five microdiskectomy   Family History  Problem Relation Age of Onset  . Cancer Mother        breast dx'ed age 20/49  . Hypertension Mother   . Diabetes Mother   . Hypothyroidism Mother   . Depression Mother   . Liver disease Father    . Heart attack Father   . Arthritis Father   . Alcohol abuse Father   . Depression Father   . Drug abuse Father   . Early death Father   . Heart disease Father   . Hypertension Father   . Depression Sister   . Hypertension Sister   . Arthritis Sister   . Hypertension Sister   . Cancer Maternal Grandmother        stomach>liver>brain met  . Cancer Maternal Uncle        lung not a smoker    Social History   Socioeconomic History  . Marital status: Married    Spouse name: Not on file  . Number of children: 2  . Years of education: Not on file  . Highest education level: Not on file  Occupational History  . Not on file  Social Needs  . Financial resource strain: Not on file  . Food insecurity:    Worry: Not on file    Inability: Not on file  . Transportation needs:    Medical: Not on file    Non-medical: Not on file  Tobacco Use  . Smoking status: Former Smoker    Types: Cigarettes  . Smokeless tobacco: Never Used  Substance and Sexual Activity  . Alcohol use: No    Comment: " occasionally"  . Drug use: No  .  Sexual activity: Yes    Birth control/protection: Other-see comments, Rhythm    Comment: withdrawl  Lifestyle  . Physical activity:    Days per week: 3 days    Minutes per session: 40 min  . Stress: Rather much  Relationships  . Social connections:    Talks on phone: Not on file    Gets together: Not on file    Attends religious service: Not on file    Active member of club or organization: Not on file    Attends meetings of clubs or organizations: Not on file    Relationship status: Not on file  . Intimate partner violence:    Fear of current or ex partner: Not on file    Emotionally abused: Not on file    Physically abused: Not on file    Forced sexual activity: Not on file  Other Topics Concern  . Not on file  Social History Narrative   High school works at [employer]   Married with kids     Owns guns   Wears seat belt   Safe in  relationship    Current Meds  Medication Sig  . lisinopril (PRINIVIL,ZESTRIL) 10 MG tablet Take 1 tablet (10 mg total) by mouth daily.   Allergies  Allergen Reactions  . Amoxicillin Hives, Itching, Swelling and Dermatitis    Hives   . Other Itching and Swelling    Almonds-mouth swells and itches  . Apple Other (See Comments)    Lips itch and swell, inside of mouth gets hives   Recent Results (from the past 2160 hour(s))  Lactate Dehydrogenase (LDH)     Status: None   Collection Time: 04/02/18  3:50 PM  Result Value Ref Range   LDH 215 119 - 226 IU/L  Urinalysis     Status: None   Collection Time: 04/02/18  3:50 PM  Result Value Ref Range   Specific Gravity, UA 1.018 1.005 - 1.030   pH, UA 6.0 5.0 - 7.5   Color, UA Yellow Yellow   Appearance Ur Clear Clear   Leukocytes, UA Negative Negative   Protein, UA Negative Negative/Trace   Glucose, UA Negative Negative   Ketones, UA Negative Negative   RBC, UA Negative Negative   Bilirubin, UA Negative Negative   Urobilinogen, Ur 0.2 0.2 - 1.0 mg/dL   Nitrite, UA Negative Negative  H Pylori, IGM, IGG, IGA AB     Status: None   Collection Time: 04/02/18  3:50 PM  Result Value Ref Range   H. pylori, IgG AbS <0.80 0.00 - 0.79 Index Value    Comment:                              Negative           <0.80                              Equivocal    0.80 - 0.89                              Positive           >0.89    H. pylori, IgA Abs <9.0 0.0 - 8.9 units    Comment:  Negative          <9.0                                 Equivocal   9.0 - 11.0                                 Positive         >11.0    H pylori, IgM Abs <9.0 0.0 - 8.9 units    Comment:                                 Negative          <9.0                                 Equivocal   9.0 - 11.0                                 Positive         >11.0 This test was developed and its performance characteristics determined by [facility]. It has  not been cleared or approved by the Food and Drug Administration.   Hepatitis B surface antibody     Status: Abnormal   Collection Time: 04/09/18  3:58 PM  Result Value Ref Range   Hepatitis B Surf Ab Quant <3.1 (L) Immunity>9.9 mIU/mL    Comment:   Status of Immunity                     Anti-HBs Level   ------------------                     -------------- Inconsistent with Immunity                   0.0 - 9.9 Consistent with Immunity                          >9.9   Protime-INR     Status: None   Collection Time: 04/23/18 10:10 AM  Result Value Ref Range   INR 1.0 0.8 - 1.2    Comment: Reference interval is for non-anticoagulated patients. Suggested INR therapeutic range for Vitamin K antagonist therapy:    Standard Dose (moderate intensity                   therapeutic range):       2.0 - 3.0    Higher intensity therapeutic range       2.5 - 3.5    Prothrombin Time 10.1 9.1 - 12.0 sec  APTT     Status: None   Collection Time: 04/23/18 10:10 AM  Result Value Ref Range   aPTT 25 24 - 33 sec    Comment: This test has not been validated for monitoring unfractionated heparin therapy. aPTT-based therapeutic ranges for unfractionated heparin therapy have not been established. For general guidelines on Heparin monitoring, refer to the [employer].   VITAMIN D 25 Hydroxy (Vit-D Deficiency, Fractures)     Status: None   Collection Time: 04/23/18 10:10 AM  Result Value  Ref Range   Vit D, 25-Hydroxy 50.0 30.0 - 100.0 ng/mL    Comment: Vitamin D deficiency has been defined by the [place] practice guideline as a level of serum 25-OH vitamin D less than 20 ng/mL (1,2). The Endocrine Society went on to further define vitamin D insufficiency as a level between 21 and 29 ng/mL (2). 1. IOM (Institute of Medicine). 2010. Dietary reference    intakes for calcium and D. [place]: The    [employer]. 2. Holick MF,  Binkley Bethany, Bischoff-Ferrari HA, et al.    Evaluation, treatment, and prevention of vitamin D    deficiency: an Endocrine Society clinical practice    guideline. JCEM. 2011 Jul; 96(7):1911-30.   PapIG, HPV, rfx 16/18     Status: None   Collection Time: 04/23/18 10:24 AM  Result Value Ref Range   DIAGNOSIS: Comment     Comment: NEGATIVE FOR INTRAEPITHELIAL LESION OR MALIGNANCY.   Specimen adequacy: Comment     Comment: Satisfactory for evaluation. Endocervical and/or squamous metaplastic cells (endocervical component) are present.    Clinician Provided ICD10 Comment     Comment: Z12.4   Performed by: Comment     Comment: Schkita Black, Cytotechnologist (ASCP)   PAP Smear Comment .    Note: Comment     Comment: The Pap smear is a screening test designed to aid in the detection of premalignant and malignant conditions of the uterine cervix.  It is not a diagnostic procedure and should not be used as the sole means of detecting cervical cancer.  Both false-positive and false-negative reports do occur.    Test Methodology Comment     Comment: This liquid based ThinPrep(R) pap test was screened with the use of an image guided system.    HPV, high-risk Negative Negative    Comment: This high-risk HPV test detects thirteen high-risk types (16/18/31/33/35/39/45/51/52/56/58/59/68) without differentiation.    Objective  Body mass index is 40.41 kg/m. Wt Readings from Last 3 Encounters:  06/25/18 258 lb (117 kg)  04/23/18 261 lb (118.4 kg)  04/02/18 257 lb (116.6 kg)   Temp Readings from Last 3 Encounters:  06/25/18 98 F (36.7 C) (Oral)  04/02/18 98.3 F (36.8 C) (Oral)  02/23/18 97.7 F (36.5 C) (Oral)   BP Readings from Last 3 Encounters:  06/25/18 106/64  04/23/18 128/78  04/02/18 112/62   Pulse Readings from Last 3 Encounters:  06/25/18 80  04/23/18 72  04/02/18 81    Physical Exam  Constitutional: She is oriented to person, place, and time. Vital signs are  normal. She appears well-developed and well-nourished. She is cooperative.  HENT:  Head: Normocephalic and atraumatic.  Mouth/Throat: Oropharynx is clear and moist and mucous membranes are normal.  Eyes: Pupils are equal, round, and reactive to light. Conjunctivae are normal.  Cardiovascular: Normal rate, regular rhythm and normal heart sounds.  Pulmonary/Chest: Effort normal and breath sounds normal.  Neurological: She is alert and oriented to person, place, and time. Gait normal.  Skin: Skin is warm, dry and intact.  Psychiatric: She has a normal mood and affect. Her speech is normal and behavior is normal. Judgment and thought content normal. Cognition and memory are normal.  Nursing note and vitals reviewed.   Assessment   1. HTN  2. Anxiety  3. DUB with heavy menses  4.symptomatic gallstones  5. HM Plan   1.  Refill lis 20 mg qd tolerating w/o side effects  2.  Start zoloft 50 mg qd, prn xanax 0.25 1/2 to 1 pill qd prn  F/u in 2 months  Disc meditation  3. Refer to Dr. [facility] for 2nd opinion  4. F/u Dr. Barry Dienes saw 04/21/18 elective removal of GB currently not sch 5.  Had flu shot this year Tdap had 01/12/16  rec hep B vaccine pt to check with health dept, if not had will need twinrix vaccine with fatty liver hx  HIV 01/04/16 neg   Saw [facility] for pap and DUB see above pt wants 2nd opinion -will rec genetics testing breast cancer with OB/GYN getting second opinion see above   -had pap 04/23/18 neg pap neg HPV   Provider: Dr. [name] McLean-Scocuzza-[facility]

## 2018-06-25 NOTE — Patient Instructions (Addendum)
Results for Debbie Townsend, Debbie Townsend (MRN [id number]) as of 06/25/2018 15:58  Ref. Range 04/09/2018 15:58  Hepatitis B Surf Ab Quant Latest Ref Range: Immunity>9.9 mIU/mL <3.1 (L)   Call health dept about hep B vaccine  Try meditation apps Insight timer, calm, head space  I will refer you for 2nd opinion OB/GYN  F/u in 2 months    Generalized Anxiety Disorder, Adult Generalized anxiety disorder (GAD) is a mental health disorder. People with this condition constantly worry about everyday events. Unlike normal anxiety, worry related to GAD is not triggered by a specific event. These worries also do not fade or get better with time. GAD interferes with life functions, including relationships, work, and school. GAD can vary from mild to severe. People with severe GAD can have intense waves of anxiety with physical symptoms (panic attacks). What are the causes? The exact cause of GAD is not known. What increases the risk? This condition is more likely to develop in:  Women.  People who have a family history of anxiety disorders.  People who are very shy.  People who experience very stressful life events, such as the death of a loved one.  People who have a very stressful family environment.  What are the signs or symptoms? People with GAD often worry excessively about many things in their lives, such as their health and family. They may also be overly concerned about:  Doing well at work.  Being on time.  Natural disasters.  Friendships.  Physical symptoms of GAD include:  Fatigue.  Muscle tension or having muscle twitches.  Trembling or feeling shaky.  Being easily startled.  Feeling like your heart is pounding or racing.  Feeling out of breath or like you cannot take a deep breath.  Having trouble falling asleep or staying asleep.  Sweating.  Nausea, diarrhea, or irritable bowel syndrome (IBS).  Headaches.  Trouble concentrating or remembering  facts.  Restlessness.  Irritability.  How is this diagnosed? Your health care provider can diagnose GAD based on your symptoms and medical history. You will also have a physical exam. The health care provider will ask specific questions about your symptoms, including how severe they are, when they started, and if they come and go. Your health care provider may ask you about your use of alcohol or drugs, including prescription medicines. Your health care provider may refer you to a mental health specialist for further evaluation. Your health care provider will do a thorough examination and may perform additional tests to rule out other possible causes of your symptoms. To be diagnosed with GAD, a person must have anxiety that:  Is out of his or her control.  Affects several different aspects of his or her life, such as work and relationships.  Causes distress that makes him or her unable to take part in normal activities.  Includes at least three physical symptoms of GAD, such as restlessness, fatigue, trouble concentrating, irritability, muscle tension, or sleep problems.  Before your health care provider can confirm a diagnosis of GAD, these symptoms must be present more days than they are not, and they must last for six months or longer. How is this treated? The following therapies are usually used to treat GAD:  Medicine. Antidepressant medicine is usually prescribed for long-term daily control. Antianxiety medicines may be added in severe cases, especially when panic attacks occur.  Talk therapy (psychotherapy). Certain types of talk therapy can be helpful in treating GAD by providing support, education, and guidance. Options  include: ? Cognitive behavioral therapy (CBT). People learn coping skills and techniques to ease their anxiety. They learn to identify unrealistic or negative thoughts and behaviors and to replace them with positive ones. ? Acceptance and commitment therapy (ACT).  This treatment teaches people how to be mindful as a way to cope with unwanted thoughts and feelings. ? Biofeedback. This process trains you to manage your body's response (physiological response) through breathing techniques and relaxation methods. You will work with a therapist while machines are used to monitor your physical symptoms.  Stress management techniques. These include yoga, meditation, and exercise.  A mental health specialist can help determine which treatment is best for you. Some people see improvement with one type of therapy. However, other people require a combination of therapies. Follow these instructions at home:  Take over-the-counter and prescription medicines only as told by your health care provider.  Try to maintain a normal routine.  Try to anticipate stressful situations and allow extra time to manage them.  Practice any stress management or self-calming techniques as taught by your health care provider.  Do not punish yourself for setbacks or for not making progress.  Try to recognize your accomplishments, even if they are small.  Keep all follow-up visits as told by your health care provider. This is important. Contact a health care provider if:  Your symptoms do not get better.  Your symptoms get worse.  You have signs of depression, such as: ? A persistently sad, cranky, or irritable mood. ? Loss of enjoyment in activities that used to bring you joy. ? Change in weight or eating. ? Changes in sleeping habits. ? Avoiding friends or family members. ? Loss of energy for normal tasks. ? Feelings of guilt or worthlessness. Get help right away if:  You have serious thoughts about hurting yourself or others. If you ever feel like you may hurt yourself or others, or have thoughts about taking your own life, get help right away. You can go to your nearest emergency department or call:  Your local emergency services (911 in the [place]  A suicide  crisis helpline, such as the [place] at 862-539-4136. This is open 24 hours a day.  Summary  Generalized anxiety disorder (GAD) is a mental health disorder that involves worry that is not triggered by a specific event.  People with GAD often worry excessively about many things in their lives, such as their health and family.  GAD may cause physical symptoms such as restlessness, trouble concentrating, sleep problems, frequent sweating, nausea, diarrhea, headaches, and trembling or muscle twitching.  A mental health specialist can help determine which treatment is best for you. Some people see improvement with one type of therapy. However, other people require a combination of therapies. This information is not intended to replace advice given to you by your health care provider. Make sure you discuss any questions you have with your health care provider. Document Released: 03/29/2013 Document Revised: 10/22/2016 Document Reviewed: 10/22/2016 Elsevier Interactive Patient Education  2018 [employer].   Hepatitis B Vaccine, Recombinant injection What is this medicine? HEPATITIS B VACCINE (hep uh TAHY tis B VAK seen) is a vaccine. It is used to prevent an infection with the hepatitis B virus. This medicine may be used for other purposes; ask your health care provider or pharmacist if you have questions. COMMON BRAND NAME(S): Engerix-B, Recombivax HB What should I tell my health care provider before I take this medicine? They need to know  if you have any of these conditions: -fever, infection -heart disease -hepatitis B infection -immune system problems -kidney disease -an unusual or allergic reaction to vaccines, yeast, other medicines, foods, dyes, or preservatives -pregnant or trying to get pregnant -breast-feeding How should I use this medicine? This vaccine is for injection into a muscle. It is given by a health care professional. A copy of Vaccine  Information Statements will be given before each vaccination. Read this sheet carefully each time. The sheet may change frequently. Talk to your pediatrician regarding the use of this medicine in children. While this drug may be prescribed for children as young as newborn for selected conditions, precautions do apply. Overdosage: If you think you have taken too much of this medicine contact a poison control center or emergency room at once. NOTE: This medicine is only for you. Do not share this medicine with others. What if I miss a dose? It is important not to miss your dose. Call your doctor or health care professional if you are unable to keep an appointment. What may interact with this medicine? -medicines that suppress your immune function like adalimumab, anakinra, infliximab -medicines to treat cancer -steroid medicines like prednisone or cortisone This list may not describe all possible interactions. Give your health care provider a list of all the medicines, herbs, non-prescription drugs, or dietary supplements you use. Also tell them if you smoke, drink alcohol, or use illegal drugs. Some items may interact with your medicine. What should I watch for while using this medicine? See your health care provider for all shots of this vaccine as directed. You must have 3 shots of this vaccine for protection from hepatitis B infection. Tell your doctor right away if you have any serious or unusual side effects after getting this vaccine. What side effects may I notice from receiving this medicine? Side effects that you should report to your doctor or health care professional as soon as possible: -allergic reactions like skin rash, itching or hives, swelling of the face, lips, or tongue -breathing problems -confused, irritated -fast, irregular heartbeat -flu-like syndrome -numb, tingling pain -seizures -unusually weak or tired Side effects that usually do not require medical attention (report  to your doctor or health care professional if they continue or are bothersome): -diarrhea -fever -headache -loss of appetite -muscle pain -nausea -pain, redness, swelling, or irritation at site where injected -tiredness This list may not describe all possible side effects. Call your doctor for medical advice about side effects. You may report side effects to FDA at 1-800-FDA-1088. Where should I keep my medicine? This drug is given in a hospital or clinic and will not be stored at home. NOTE: This sheet is a summary. It may not cover all possible information. If you have questions about this medicine, talk to your doctor, pharmacist, or health care provider.  2018 Elsevier/Gold Standard (2014-04-04 13:26:01)

## 2018-06-28 ENCOUNTER — Encounter: Payer: Self-pay | Admitting: Internal Medicine

## 2018-07-03 ENCOUNTER — Ambulatory Visit: Payer: Self-pay | Admitting: Internal Medicine

## 2018-07-03 ENCOUNTER — Encounter: Payer: Self-pay | Admitting: Internal Medicine

## 2018-07-13 ENCOUNTER — Encounter: Payer: Self-pay | Admitting: Internal Medicine

## 2018-07-13 ENCOUNTER — Other Ambulatory Visit: Payer: Self-pay | Admitting: Internal Medicine

## 2018-07-13 ENCOUNTER — Telehealth: Payer: Self-pay | Admitting: Internal Medicine

## 2018-07-13 DIAGNOSIS — F419 Anxiety disorder, unspecified: Secondary | ICD-10-CM

## 2018-07-13 MED ORDER — SERTRALINE HCL 50 MG PO TABS: 75.0000 mg | ORAL_TABLET | Freq: Every day | ORAL | 1 refills | Status: DC

## 2018-07-13 NOTE — Telephone Encounter (Signed)
Sent   [place]

## 2018-07-13 NOTE — Telephone Encounter (Signed)
Pt has changed dosing and is requesting approval for increase. pls advise

## 2018-07-13 NOTE — Telephone Encounter (Signed)
Request for rx refill

## 2018-07-13 NOTE — Telephone Encounter (Signed)
Copied from [place] [id number]. Topic: Quick Communication - Rx Refill/Question >> Jul 13, 2018 10:54 AM Oliver Pila B wrote: Medication: sertraline (ZOLOFT) 50 MG tablet [[id number]]   Pt called to let pcp know that she upped dosage from 50mg  to 75mg ; pt is going to need assistance w/ her next refill since the request will be ahead of her normal refill time; contact pt to advise

## 2018-08-26 ENCOUNTER — Ambulatory Visit: Payer: Self-pay | Admitting: Family

## 2018-09-08 ENCOUNTER — Encounter: Payer: Self-pay | Admitting: Internal Medicine

## 2018-09-10 ENCOUNTER — Encounter: Payer: Self-pay | Admitting: Internal Medicine

## 2018-09-10 ENCOUNTER — Ambulatory Visit: Payer: Managed Care, Other (non HMO) | Admitting: Internal Medicine

## 2018-09-10 VITALS — BP 122/64 | HR 103 | Temp 98.6°F | Ht 67.0 in | Wt 261.2 lb

## 2018-09-10 DIAGNOSIS — K8020 Calculus of gallbladder without cholecystitis without obstruction: Secondary | ICD-10-CM

## 2018-09-10 DIAGNOSIS — K760 Fatty (change of) liver, not elsewhere classified: Secondary | ICD-10-CM | POA: Diagnosis not present

## 2018-09-10 DIAGNOSIS — R739 Hyperglycemia, unspecified: Secondary | ICD-10-CM

## 2018-09-10 DIAGNOSIS — N938 Other specified abnormal uterine and vaginal bleeding: Secondary | ICD-10-CM | POA: Insufficient documentation

## 2018-09-10 DIAGNOSIS — F419 Anxiety disorder, unspecified: Secondary | ICD-10-CM | POA: Diagnosis not present

## 2018-09-10 DIAGNOSIS — Z1159 Encounter for screening for other viral diseases: Secondary | ICD-10-CM

## 2018-09-10 DIAGNOSIS — I10 Essential (primary) hypertension: Secondary | ICD-10-CM | POA: Diagnosis not present

## 2018-09-10 DIAGNOSIS — Z0184 Encounter for antibody response examination: Secondary | ICD-10-CM

## 2018-09-10 DIAGNOSIS — M79601 Pain in right arm: Secondary | ICD-10-CM

## 2018-09-10 DIAGNOSIS — Z1329 Encounter for screening for other suspected endocrine disorder: Secondary | ICD-10-CM

## 2018-09-10 HISTORY — DX: Other specified abnormal uterine and vaginal bleeding: N93.8

## 2018-09-10 NOTE — Progress Notes (Signed)
Chief Complaint  Patient presents with  . Follow-up   F/u  1. HTN controlled lis 20 mg qd 2. Anxiety controlled on zoloft 75 and not using prn xanax  3. Fatty liver rec hep A/B vaccine 4. DUB has f/u with OB/GYN Dr. Alwyn Pea today and [place] yesterday  5. Gallstones not flaring and has not sch elective removal of GB 6. Right arm upper soreness since had vaccine at health dept 06/2018 nothing tried arm feels warm she is unsure what vaccine was given   Review of Systems  Constitutional: Negative for weight loss.  HENT: Negative for hearing loss.   Eyes: Negative for blurred vision.  Respiratory: Negative for shortness of breath.   Cardiovascular: Negative for chest pain.  Gastrointestinal: Negative for abdominal pain.  Musculoskeletal: Positive for myalgias.  Skin: Negative for rash.  Neurological: Negative for headaches.  Psychiatric/Behavioral: The patient is not nervous/anxious.    Past Medical History:  Diagnosis Date  . Anxiety   . BRCA negative   . Depression   . Family history of anesthesia complication     mother had Post -op nausea and dizziness.  Marland Kitchen GERD (gastroesophageal reflux disease)   . Headache   . Hypertension   . Kidney stones   . Pseudotumor cerebri 2007 or 2009   Past Surgical History:  Procedure Laterality Date  . CESAREAN SECTION     2009/2017  . LUMBAR LAMINECTOMY/DECOMPRESSION MICRODISCECTOMY Left 09/22/2014   Procedure: Left Lumbar four-five microdiskectomy;  Surgeon: Consuella Lose, MD;  Location: [facility];  Service: Neurosurgery;  Laterality: Left;  Left Lumbar four-five microdiskectomy   Family History  Problem Relation Age of Onset  . Cancer Mother        breast dx'ed age 42/49  . Hypertension Mother   . Diabetes Mother   . Hypothyroidism Mother   . Depression Mother   . Liver disease Father   . Heart attack Father   . Arthritis Father   . Alcohol abuse Father   . Depression Father   . Drug abuse Father   . Early death Father   . Heart  disease Father   . Hypertension Father   . Depression Sister   . Hypertension Sister   . Arthritis Sister   . Hypertension Sister   . Cancer Maternal Grandmother        stomach>liver>brain met  . Cancer Maternal Uncle        lung not a smoker    Social History   Socioeconomic History  . Marital status: Married    Spouse name: Not on file  . Number of children: 2  . Years of education: Not on file  . Highest education level: Not on file  Occupational History  . Not on file  Social Needs  . Financial resource strain: Not on file  . Food insecurity:    Worry: Not on file    Inability: Not on file  . Transportation needs:    Medical: Not on file    Non-medical: Not on file  Tobacco Use  . Smoking status: Former Smoker    Types: Cigarettes  . Smokeless tobacco: Never Used  Substance and Sexual Activity  . Alcohol use: No    Comment: " occasionally"  . Drug use: No  . Sexual activity: Yes    Birth control/protection: Other-see comments, Rhythm    Comment: withdrawl  Lifestyle  . Physical activity:    Days per week: 3 days    Minutes per session: 40  min  . Stress: Rather much  Relationships  . Social connections:    Talks on phone: Not on file    Gets together: Not on file    Attends religious service: Not on file    Active member of club or organization: Not on file    Attends meetings of clubs or organizations: Not on file    Relationship status: Not on file  . Intimate partner violence:    Fear of current or ex partner: Not on file    Emotionally abused: Not on file    Physically abused: Not on file    Forced sexual activity: Not on file  Other Topics Concern  . 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 ALPRAZolam (XANAX) 0.25 MG tablet Take 1 tablet (0.25 mg total) by mouth  daily as needed for anxiety.  Marland Kitchen lisinopril (PRINIVIL,ZESTRIL) 20 MG tablet Take 1 tablet (20 mg total) by mouth daily. In am  . sertraline (ZOLOFT) 50 MG tablet Take 1.5 tablets (75 mg total) by mouth daily.   Allergies  Allergen Reactions  . Amoxicillin Hives, Itching, Swelling and Dermatitis    Hives   . Other Itching and Swelling    Almonds-mouth swells and itches  . Apple Other (See Comments)    Lips itch and swell, inside of mouth gets hives   No results found for this or any previous visit (from the past 2160 hour(s)). Objective  Body mass index is 40.91 kg/m. Wt Readings from Last 3 Encounters:  09/10/18 261 lb 3.2 oz (118.5 kg)  06/25/18 258 lb (117 kg)  04/23/18 261 lb (118.4 kg)   Temp Readings from Last 3 Encounters:  09/10/18 98.6 F (37 C) (Oral)  06/25/18 98 F (36.7 C) (Oral)  04/02/18 98.3 F (36.8 C) (Oral)   BP Readings from Last 3 Encounters:  09/10/18 122/64  06/25/18 106/64  04/23/18 128/78   Pulse Readings from Last 3 Encounters:  09/10/18 (!) 103  06/25/18 80  04/23/18 72    Physical Exam  Constitutional: She is oriented to person, place, and time. Vital signs are normal. She appears well-developed and well-nourished. She is cooperative.  HENT:  Head: Normocephalic and atraumatic.  Mouth/Throat: Oropharynx is clear and moist and mucous membranes are normal.  Eyes: Pupils are equal, round, and reactive to light. Conjunctivae are normal.  Cardiovascular: Normal rate, regular rhythm and normal heart sounds.  Pulmonary/Chest: Effort normal and breath sounds normal.  Musculoskeletal:       Arms: Neurological: She is alert and oriented to person, place, and time. Gait normal.  Skin: Skin is warm, dry and intact.  Psychiatric: She has a normal mood and affect. Her speech is normal and behavior is normal. Judgment and thought content normal. Cognition and memory are normal.  Nursing note and vitals reviewed.   Assessment   1. HTN  2. Anxiety    3. Fatty liver  4. DUB 5. Gallstones  6. Right upper arm pain s/p vaccine 06/2018  7. HM Plan   1. Cont meds 2. Cont meds doing well  3. Rec hep A/B vaccine  Given info  4. F/u OB/GYN Dr. Alwyn Pea today in [place]  5. No f/u for GB removal  at this time  6. Nsaids, prn tylenol rec heat  mychart in 2 weeks if not better and consider imaging  7.  Declines flu shot Tdap had 01/12/16  rec twinrix vaccine with fatty liver hx  Check MMR status  HIV 01/04/16 neg  -had pap 04/23/18 neg pap neg HPV    Provider: Dr. [name] [facility]

## 2018-09-10 NOTE — Progress Notes (Signed)
Pre visit review using our clinic review tool, if applicable. No additional management support is needed unless otherwise documented below in the visit note. 

## 2018-09-10 NOTE — Patient Instructions (Addendum)
Results for Debbie Townsend, Debbie Townsend (MRN [id number]) as of 09/10/2018 14:06  Ref. Range 04/09/2018 15:58  Hepatitis B Surf Ab Quant Latest Ref Range: Immunity>9.9 mIU/mL <3.1 (L)   If you want to get twinrix vaccine we have it here 3 dose over 6 months  Ask health dept if had MMR titer or lab test  Find out last vaccine at health dept     Ref. Range 02/23/2018 08:42  TSH Latest Ref Range: 0.450 - 4.500 uIU/mL 1.960  Thyroxine (T4) Latest Ref Range: 4.5 - 12.0 ug/dL 5.9  Free Thyroxine Index Latest Ref Range: 1.2 - 4.9  1.5  T3 Uptake Ratio Latest Ref Range: 24 - 39 % 25    Try Tylenol 500 mg up to 6-8 pills per day  Ibuprofen as needed max 2400 mg per day on for 2 weeks or less  Try heat right arm  If not better call back in 2 weeks   Hepatitis A; Hepatitis B Vaccine injection What is this medicine? HEPATITIS A VACCINE; HEPATITIS B VACCINE (hep uh TAHY tis A vak SEEN; hep uh TAHY tis B vak SEEN) is a vaccine to protect from an infection with the hepatitis A and B virus. This vaccine does not contain the live viruses. It will not cause a hepatitis infection. This medicine may be used for other purposes; ask your health care provider or pharmacist if you have questions. COMMON BRAND NAME(S): Twinrix What should I tell my health care provider before I take this medicine? They need to know if you have any of these conditions: -bleeding disorder -fever or infection -heart disease -immune system problems -an unusual or allergic reaction to hepatitis A or B vaccine, neomycin, yeast, thimerosal, other medicines, foods, dyes, or preservatives -pregnant or trying to get pregnant -breast-feeding How should I use this medicine? This vaccine is for injection into a muscle. It is given by a health care professional. A copy of Vaccine Information Statements will be given before each vaccination. Read this sheet carefully each time. The sheet may change frequently. Talk to your pediatrician regarding the  use of this medicine in children. Special care may be needed. Overdosage: If you think you have taken too much of this medicine contact a poison control center or emergency room at once. NOTE: This medicine is only for you. Do not share this medicine with others. What if I miss a dose? It is important not to miss your dose. Call your doctor or health care professional if you are unable to keep an appointment. What may interact with this medicine? -medicines that suppress your immune function like adalimumab, anakinra, infliximab -medicines to treat cancer -steroid medicines like prednisone or cortisone This list may not describe all possible interactions. Give your health care provider a list of all the medicines, herbs, non-prescription drugs, or dietary supplements you use. Also tell them if you smoke, drink alcohol, or use illegal drugs. Some items may interact with your medicine. What should I watch for while using this medicine? See your health care provider for all shots of this vaccine as directed. You must have 3 to 4 shots of this vaccine for protection from hepatitis A and B infection. Tell your doctor right away if you have any serious or unusual side effects after getting this vaccine. What side effects may I notice from receiving this medicine? Side effects that you should report to your doctor or health care professional as soon as possible: -allergic reactions like skin rash, itching or hives,  swelling of the face, lips, or tongue -breathing problems -confused, irritated -fast, irregular heartbeat -flu-like syndrome -numb, tingling pain -seizures Side effects that usually do not require medical attention (report to your doctor or health care professional if they continue or are bothersome): -diarrhea -fever -headache -loss of appetite -muscle pain -nausea -pain, redness, swelling, or irritation at site where injected -tiredness This list may not describe all possible side  effects. Call your doctor for medical advice about side effects. You may report side effects to FDA at 1-800-FDA-1088. Where should I keep my medicine? This drug is given in a hospital or clinic and will not be stored at home. NOTE: This sheet is a summary. It may not cover all possible information. If you have questions about this medicine, talk to your doctor, pharmacist, or health care provider.  2018 Elsevier/Gold Standard (2008-04-15 63:14:97)  Nonalcoholic Fatty Liver Disease Diet Nonalcoholic fatty liver disease is a condition that causes fat to accumulate in and around the liver. The disease makes it harder for the liver to work the way that it should. Following a healthy diet can help to keep nonalcoholic fatty liver disease under control. It can also help to prevent or improve conditions that are associated with the disease, such as heart disease, diabetes, high blood pressure, and abnormal cholesterol levels. Along with regular exercise, this diet:  Promotes weight loss.  Helps to control blood sugar levels.  Helps to improve the way that the body uses insulin.  What do I need to know about this diet?  Use the glycemic index (GI) to plan your meals. The index tells you how quickly a food will raise your blood sugar. Choose low-GI foods. These foods take a longer time to raise blood sugar.  Keep track of how many calories you take in. Eating the right amount of calories will help you to achieve a healthy weight.  You may want to follow a Mediterranean diet. This diet includes a lot of vegetables, lean meats or fish, whole grains, fruits, and healthy oils and fats. What foods can I eat? Grains Whole grains, such as whole-wheat or whole-grain breads, crackers, tortillas, cereals, and pasta. Stone-ground whole wheat. Pumpernickel bread. Unsweetened oatmeal. Bulgur. Barley. Quinoa. Brown or wild rice. Corn or whole-wheat flour tortillas. Vegetables Lettuce. Spinach. Peas. Beets.  Cauliflower. Cabbage. Broccoli. Carrots. Tomatoes. Squash. Eggplant. Herbs. Peppers. Onions. Cucumbers. Brussels sprouts. Yams and sweet potatoes. Beans. Lentils. Fruits Bananas. Apples. Oranges. Grapes. Papaya. Mango. Pomegranate. Kiwi. Grapefruit. Cherries. Meats and Other Protein Sources Seafood and shellfish. Lean meats. Poultry. Tofu. Dairy Low-fat or fat-free dairy products, such as yogurt, cottage cheese, and cheese. Beverages Water. Sugar-free drinks. Tea. Coffee. Low-fat or skim milk. Milk alternatives, such as soy or almond milk. Real fruit juice. Condiments Mustard. Relish. Low-fat, low-sugar ketchup and barbecue sauce. Low-fat or fat-free mayonnaise. Sweets and Desserts Sugar-free sweets. Fats and Oils Avocado. Canola or olive oil. Nuts and nut butters. Seeds. The items listed above may not be a complete list of recommended foods or beverages. Contact your dietitian for more options. What foods are not recommended? Palm oil and coconut oil. Processed foods. Fried foods. Sweetened drinks, such as sweet tea, milkshakes, snow cones, iced sweet drinks, and sodas. Alcohol. Sweets. Foods that contain a lot of salt or sodium. The items listed above may not be a complete list of foods and beverages to avoid. Contact your dietitian for more information. This information is not intended to replace advice given to you by your health care  provider. Make sure you discuss any questions you have with your health care provider. Document Released: 04/18/2015 Document Revised: 05/09/2016 Document Reviewed: 12/27/2014 Elsevier Interactive Patient Education  2018 [place].  Fatty Liver Fatty liver, also called hepatic steatosis or steatohepatitis, is a condition in which too much fat has built up in your liver cells. The liver removes harmful substances from your bloodstream. It produces fluids your body needs. It also helps your body use and store energy from the food you eat. In many cases,  fatty liver does not cause symptoms or problems. It is often diagnosed when tests are being done for other reasons. However, over time, fatty liver can cause inflammation that may lead to more serious liver problems, such as scarring of the liver (cirrhosis). What are the causes? Causes of fatty liver may include:  Drinking too much alcohol.  Poor nutrition.  Obesity.  Cushing syndrome.  Diabetes.  Hyperlipidemia.  Pregnancy.  Certain drugs.  Poisons.  Some viral infections.  What increases the risk? You may be more likely to develop fatty liver if you:  Abuse alcohol.  Are pregnant.  Are overweight.  Have diabetes.  Have hepatitis.  Have a high triglyceride level.  What are the signs or symptoms? Fatty liver often does not cause any symptoms. In cases where symptoms develop, they can include:  Fatigue.  Weakness.  Weight loss.  Confusion.  Abdominal pain.  Yellowing of your skin and the white parts of your eyes (jaundice).  Nausea and vomiting.  How is this diagnosed? Fatty liver may be diagnosed by:  Physical exam and medical history.  Blood tests.  Imaging tests, such as an ultrasound, CT scan, or MRI.  Liver biopsy. A small sample of liver tissue is removed using a needle. The sample is then looked at under a microscope.  How is this treated? Fatty liver is often caused by other health conditions. Treatment for fatty liver may involve medicines and lifestyle changes to manage conditions such as:  Alcoholism.  High cholesterol.  Diabetes.  Being overweight or obese.  Follow these instructions at home:  Eat a healthy diet as directed by your health care provider.  Exercise regularly. This can help you lose weight and control your cholesterol and diabetes. Talk to your health care provider about an exercise plan and which activities are best for you.  Do not drink alcohol.  Take medicines only as directed by your health care  provider. Contact a health care provider if: You have difficulty controlling your:  Blood sugar.  Cholesterol.  Alcohol consumption.  Get help right away if:  You have abdominal pain.  You have jaundice.  You have nausea and vomiting. This information is not intended to replace advice given to you by your health care provider. Make sure you discuss any questions you have with your health care provider. Document Released: 01/17/2006 Document Revised: 05/09/2016 Document Reviewed: 04/13/2014 Elsevier Interactive Patient Education  [employer].

## 2018-09-11 ENCOUNTER — Ambulatory Visit: Payer: Self-pay | Admitting: Internal Medicine

## 2018-09-21 ENCOUNTER — Encounter: Payer: Self-pay | Admitting: Internal Medicine

## 2018-10-19 ENCOUNTER — Ambulatory Visit
Admission: RE | Admit: 2018-10-19 | Discharge: 2018-10-19 | Disposition: A | Discharge status: Home / Self Care | Payer: BLUE CROSS/BLUE SHIELD | Source: Ambulatory Visit | Attending: Internal Medicine | Admitting: Internal Medicine

## 2018-10-19 ENCOUNTER — Other Ambulatory Visit: Payer: Self-pay | Admitting: Internal Medicine

## 2018-10-19 DIAGNOSIS — Z1231 Encounter for screening mammogram for malignant neoplasm of breast: Secondary | ICD-10-CM

## 2018-10-22 ENCOUNTER — Ambulatory Visit
Admission: RE | Admit: 2018-10-22 | Discharge: 2018-10-22 | Disposition: A | Discharge status: Home / Self Care | Payer: BLUE CROSS/BLUE SHIELD | Source: Ambulatory Visit | Attending: Internal Medicine | Admitting: Internal Medicine

## 2018-10-22 ENCOUNTER — Other Ambulatory Visit: Payer: Self-pay | Admitting: Internal Medicine

## 2018-10-22 DIAGNOSIS — N6310 Unspecified lump in the right breast, unspecified quadrant: Secondary | ICD-10-CM

## 2018-10-22 DIAGNOSIS — Z803 Family history of malignant neoplasm of breast: Secondary | ICD-10-CM | POA: Diagnosis not present

## 2018-10-22 DIAGNOSIS — N6011 Diffuse cystic mastopathy of right breast: Secondary | ICD-10-CM | POA: Diagnosis not present

## 2018-10-26 ENCOUNTER — Other Ambulatory Visit (HOSPITAL_COMMUNITY)
Admission: RE | Admit: 2018-10-26 | Discharge: 2018-10-26 | Disposition: A | Discharge status: Home / Self Care | Payer: BLUE CROSS/BLUE SHIELD | Source: Ambulatory Visit | Attending: Obstetrics & Gynecology | Admitting: Obstetrics & Gynecology

## 2018-10-26 ENCOUNTER — Ambulatory Visit (INDEPENDENT_AMBULATORY_CARE_PROVIDER_SITE_OTHER): Payer: BLUE CROSS/BLUE SHIELD | Admitting: Obstetrics & Gynecology

## 2018-10-26 ENCOUNTER — Ambulatory Visit: Payer: Managed Care, Other (non HMO) | Admitting: Obstetrics & Gynecology

## 2018-10-26 ENCOUNTER — Encounter: Payer: Self-pay | Admitting: Obstetrics & Gynecology

## 2018-10-26 VITALS — BP 120/80 | Ht 66.0 in | Wt 262.0 lb

## 2018-10-26 DIAGNOSIS — N946 Dysmenorrhea, unspecified: Secondary | ICD-10-CM

## 2018-10-26 DIAGNOSIS — N939 Abnormal uterine and vaginal bleeding, unspecified: Secondary | ICD-10-CM | POA: Diagnosis not present

## 2018-10-26 DIAGNOSIS — N920 Excessive and frequent menstruation with regular cycle: Secondary | ICD-10-CM

## 2018-10-26 NOTE — Progress Notes (Signed)
HPI:      [name] is a 32 y.o. [id number] who LMP was Patient's last menstrual period was 10/09/2018., presents today for a problem visit.  She complains of menorrhagia that  began several months ago and its severity is described as severe.  She has regular periods every 30 days and they are associated with moderate menstrual cramping.  She has used the following for attempts at control: maxi pad.  Previous evaluation: office visit on [id number], Oct 2019. Prior Diagnosis: adenomyosis based on [place].Marland Kitchen Previous Treatment: OCP in past but did not tolerate. BC- pull out method for decades  She is has sex with males.  Hx of STDs: none. She is premenopausal.  PMHx: She  has a past medical history of Anxiety, BRCA negative, Depression, Family history of anesthesia complication, GERD (gastroesophageal reflux disease), Headache, Hypertension, Kidney stones, and Pseudotumor cerebri (2007 or 2009). Also,  has a past surgical history that includes Lumbar laminectomy/decompression microdiscectomy (Left, 09/22/2014) and Cesarean section., family history includes Alcohol abuse in her father; Arthritis in her father and sister; Breast cancer in her mother; Cancer in her maternal grandmother, maternal uncle, and mother; Depression in her father, mother, and sister; Diabetes in her mother; Drug abuse in her father; Early death in her father; Heart attack in her father; Heart disease in her father; Hypertension in her father, mother, sister, and sister; Hypothyroidism in her mother; Liver disease in her father.,  reports that she has quit smoking. Her smoking use included cigarettes. She has never used smokeless tobacco. She reports that she does not drink alcohol or use drugs.  She  Current Outpatient Medications:  .  lisinopril (PRINIVIL,ZESTRIL) 20 MG tablet, Take 1 tablet (20 mg total) by mouth daily. In am, Disp: 90 tablet, Rfl: 3 .  sertraline (ZOLOFT) 50 MG tablet, Take 1.5 tablets (75 mg total) by mouth  daily., Disp: 140 tablet, Rfl: 1 .  ALPRAZolam (XANAX) 0.25 MG tablet, Take 1 tablet (0.25 mg total) by mouth daily as needed for anxiety. (Patient not taking: Reported on 09/10/2018), Disp: 30 tablet, Rfl: 0  Also, is allergic to amoxicillin; other; and apple.  Review of Systems  Constitutional: Negative for chills, fever and malaise/fatigue.  HENT: Negative for congestion, sinus pain and sore throat.   Eyes: Negative for blurred vision and pain.  Respiratory: Negative for cough and wheezing.   Cardiovascular: Negative for chest pain and leg swelling.  Gastrointestinal: Negative for abdominal pain, constipation, diarrhea, heartburn, nausea and vomiting.  Genitourinary: Negative for dysuria, frequency, hematuria and urgency.  Musculoskeletal: Negative for back pain, joint pain, myalgias and neck pain.  Skin: Negative for itching and rash.  Neurological: Negative for dizziness, tremors and weakness.  Endo/Heme/Allergies: Does not bruise/bleed easily.  Psychiatric/Behavioral: Negative for depression. The patient is not nervous/anxious and does not have insomnia.     Objective: BP 120/80   Ht _0  (1.676 m)   Wt 262 lb (118.8 kg)   LMP 10/09/2018   BMI 42.29 kg/m  Physical Exam  Constitutional: She is oriented to person, place, and time. She appears well-developed and well-nourished. No distress.  Genitourinary: Vagina normal and uterus normal. Pelvic exam was performed with patient supine. There is no rash, tenderness or lesion on the right labia. There is no rash, tenderness or lesion on the left labia. No erythema or bleeding in the vagina. Right adnexum does not display mass and does not display tenderness. Left adnexum does not display mass and does not display tenderness.  Cervix does not exhibit motion tenderness, discharge, polyp or nabothian cyst.   Uterus is mobile and midaxial. Uterus is not enlarged or exhibiting a mass.  HENT:  Head: Normocephalic and atraumatic.  Nose: Nose  normal.  Mouth/Throat: Oropharynx is clear and moist.  Abdominal: Soft. She exhibits no distension. There is no tenderness.  Musculoskeletal: Normal range of motion.  Neurological: She is alert and oriented to person, place, and time. No cranial nerve deficit.  Skin: Skin is warm and dry.  Psychiatric: She has a normal mood and affect.    ASSESSMENT/PLAN:  menorrhagia and dysmenorrhea  Problem List Items Addressed This Visit      Other   Heavy menses - Primary   Relevant Orders   Surgical pathology    Other Visit Diagnoses    Dysmenorrhea         Endometrial Biopsy After discussion with the patient regarding her abnormal uterine bleeding I recommended that she proceed with an endometrial biopsy for further diagnosis. The risks, benefits, alternatives, and indications for an endometrial biopsy were discussed with the patient in detail. She understood the risks including infection, bleeding, cervical laceration and uterine perforation.  Verbal consent was obtained.   PROCEDURE NOTE:  Pipelle endometrial biopsy was performed using aseptic technique with iodine preparation.  The uterus was sounded to a length of 7 cm.  Adequate sampling was obtained with minimal blood loss.  The patient tolerated the procedure well.  Disposition will be pending pathology.   Patient has abnormal uterine bleeding . She has a normal exam today, with no evidence of lesions.  Evaluation includes the following: exam, labs such as hormonal testing, and pelvic ultrasound to evaluate for any structural gynecologic abnormalities.  Patient to follow up after testing.  Treatment option for menorrhagia or menometrorrhagia discussed in great detail with the patient.  Options include hormonal therapy, IUD therapy such as Mirena, D&C, Ablation, and Hysterectomy.  The pros and cons of each option discussed with patient.  Patient was told that it is normal to have menstrual bleeding after an endometrial ablation, only  about 80% of patients become amenorrheic, 10% of patients have normal or light periods, and 10% of patients have no change in their bleeding pattern and may need further intervention.  She was told she will observe her periods for a few months after her ablation to see what her periods will be like; it is recommended to wait until at least three months after the procedure before making conclusions about how periods are going to be like after an ablation.  Pt prefers either ablation or hysterectomy.  Info provided.  Will discuss after EMB results.  May plan ablation after next period which would be week of Dec 2.  Barnett Applebaum, MD, [facility], [facility] 10/26/2018  2:39 PM

## 2018-10-26 NOTE — Patient Instructions (Signed)
Endometrial Ablation Endometrial ablation is a procedure that destroys the thin inner layer of the lining of the uterus (endometrium). This procedure may be done:  To stop heavy periods.  To stop bleeding that is causing anemia.  To control irregular bleeding.  To treat bleeding caused by small tumors (fibroids) in the endometrium.  This procedure is often an alternative to major surgery, such as removal of the uterus and cervix (hysterectomy). As a result of this procedure:  You may not be able to have children. However, if you are premenopausal (you have not gone through menopause): ? You may still have a small chance of getting pregnant. ? You will need to use a reliable method of birth control after the procedure to prevent pregnancy.  You may stop having a menstrual period, or you may have only a small amount of bleeding during your period. Menstruation may return several years after the procedure.  Tell a health care provider about:  Any allergies you have.  All medicines you are taking, including vitamins, herbs, eye drops, creams, and over-the-counter medicines.  Any problems you or family members have had with the use of anesthetic medicines.  Any blood disorders you have.  Any surgeries you have had.  Any medical conditions you have. What are the risks? Generally, this is a safe procedure. However, problems may occur, including:  A hole (perforation) in the uterus or bowel.  Infection of the uterus, bladder, or vagina.  Bleeding.  Damage to other structures or organs.  An air bubble in the lung (air embolus).  Problems with pregnancy after the procedure.  Failure of the procedure.  Decreased ability to diagnose cancer in the endometrium.  What happens before the procedure?  You will have tests of your endometrium to make sure there are no pre-cancerous cells or cancer cells present.  You may have an ultrasound of the uterus.  You may be given  medicines to thin the endometrium.  Ask your health care provider about: ? Changing or stopping your regular medicines. This is especially important if you take diabetes medicines or blood thinners. ? Taking medicines such as aspirin and ibuprofen. These medicines can thin your blood. Do not take these medicines before your procedure if your doctor tells you not to.  Plan to have someone take you home from the hospital or clinic. What happens during the procedure?  You will lie on an exam table with your feet and legs supported as in a pelvic exam.  To lower your risk of infection: ? Your health care team will wash or sanitize their hands and put on germ-free (sterile) gloves. ? Your genital area will be washed with soap.  An IV tube will be inserted into one of your veins.  You will be given a medicine to help you relax (sedative).  A surgical instrument with a light and camera (resectoscope) will be inserted into your vagina and moved into your uterus. This allows your surgeon to see inside your uterus.  Endometrial tissue will be removed using one of the following methods: ? Radiofrequency. This method uses a radiofrequency-alternating electric current to remove the endometrium. ? Cryotherapy. This method uses extreme cold to freeze the endometrium. ? Heated-free liquid. This method uses a heated saltwater (saline) solution to remove the endometrium. ? Microwave. This method uses high-energy microwaves to heat up the endometrium and remove it. ? Thermal balloon. This method involves inserting a catheter with a balloon tip into the uterus. The balloon tip is   filled with heated fluid to remove the endometrium. The procedure may vary among health care providers and hospitals. What happens after the procedure?  Your blood pressure, heart rate, breathing rate, and blood oxygen level will be monitored until the medicines you were given have worn off.  As tissue healing occurs, you may  notice vaginal bleeding for 4-6 weeks after the procedure. You may also experience: ? Cramps. ? Thin, watery vaginal discharge that is light pink or brown in color. ? A need to urinate more frequently than usual. ? Nausea.  Do not drive for 24 hours if you were given a sedative.  Do not have sex or insert anything into your vagina until your health care provider approves. Summary  Endometrial ablation is done to treat the many causes of heavy menstrual bleeding.  The procedure may be done only after medications have been tried to control the bleeding.  Plan to have someone take you home from the hospital or clinic. This information is not intended to replace advice given to you by your health care provider. Make sure you discuss any questions you have with your health care provider. Document Released: 10/11/2004 Document Revised: 12/19/2016 Document Reviewed: 12/19/2016 Elsevier Interactive Patient Education  2017 Elsevier Inc.   Endometrial Biopsy, Care After This sheet gives you information about how to care for yourself after your procedure. Your health care provider may also give you more specific instructions. If you have problems or questions, contact your health care provider. What can I expect after the procedure? After the procedure, it is common to have:  Mild cramping.  A small amount of vaginal bleeding for a few days. This is normal.  Follow these instructions at home:  Take over-the-counter and prescription medicines only as told by your health care provider.  Do not douche, use tampons, or have sexual intercourse until your health care provider approves.  Return to your normal activities as told by your health care provider. Ask your health care provider what activities are safe for you.  Follow instructions from your health care provider about any activity restrictions, such as restrictions on strenuous exercise or heavy lifting. Contact a health care provider  if:  You have heavy bleeding, or bleed for longer than 2 days after the procedure.  You have bad smelling discharge from your vagina.  You have a fever or chills.  You have a burning sensation when urinating or you have difficulty urinating.  You have severe pain in your lower abdomen. Get help right away if:  You have severe cramps in your stomach or back.  You pass large blood clots.  Your bleeding increases.  You become weak or light-headed, or you pass out. Summary  After the procedure, it is common to have mild cramping and a small amount of vaginal bleeding for a few days.  Do not douche, use tampons, or have sexual intercourse until your health care provider approves.  Return to your normal activities as told by your health care provider. Ask your health care provider what activities are safe for you. This information is not intended to replace advice given to you by your health care provider. Make sure you discuss any questions you have with your health care provider. Document Released: 09/22/2013 Document Revised: 12/18/2016 Document Reviewed: 12/18/2016 Elsevier Interactive Patient Education  2017 Elsevier Inc.  

## 2018-10-27 ENCOUNTER — Telehealth: Payer: Self-pay | Admitting: Obstetrics & Gynecology

## 2018-10-27 NOTE — Telephone Encounter (Signed)
-----   Message from Gae Dry, MD sent at 10/26/2018  2:07 PM EST ----- Regarding: [facility] Booking Request Patient Full Name:  Debbie Townsend  MRN: [id number]  DOB: 1986/04/07  Surgeon: Hoyt Koch, MD  Requested Surgery Date and Time: Week of 11/16/18 Primary Diagnosis AND Code: Menorrhagia Secondary Diagnosis and Code: Dysmenorrhea Surgical Procedure: In Office Hysteroscopy w Ablation L&D Notification: No Admission Status: OFFICE Length of Surgery: 30 min Special Case Needs: [place] H&P: yes

## 2018-10-27 NOTE — Telephone Encounter (Signed)
Results are not back as of today

## 2018-10-27 NOTE — Telephone Encounter (Signed)
Contacted patient to schedule the ablation and gave available dates for the week requested. Patient said she wants the results back before scheduling, and needs to speak w/ her husband. Also, patient is not sure whether she wants a hysterectomy or the ablation. Patient will call back when she decides. Phone# and ext given.

## 2018-10-28 NOTE — Progress Notes (Signed)
Let her know biopsy normal

## 2018-10-28 NOTE — Progress Notes (Signed)
Left voice message to let pt know normal results

## 2018-10-28 NOTE — Telephone Encounter (Signed)
Per Quintella Baton, left message for patient, biopsy was normal.

## 2018-11-09 ENCOUNTER — Other Ambulatory Visit: Payer: Self-pay | Admitting: Internal Medicine

## 2018-11-09 ENCOUNTER — Encounter: Payer: Self-pay | Admitting: Obstetrics & Gynecology

## 2018-11-09 ENCOUNTER — Encounter: Payer: Self-pay | Admitting: Internal Medicine

## 2018-11-09 DIAGNOSIS — B3731 Acute candidiasis of vulva and vagina: Secondary | ICD-10-CM

## 2018-11-09 DIAGNOSIS — B373 Candidiasis of vulva and vagina: Secondary | ICD-10-CM

## 2018-11-09 MED ORDER — FLUCONAZOLE 150 MG PO TABS: 150.0000 mg | ORAL_TABLET | Freq: Once | ORAL | 0 refills | Status: AC

## 2018-11-09 NOTE — Telephone Encounter (Signed)
Pt calling to speak c PH's nurse.  [phone]

## 2018-11-09 NOTE — Telephone Encounter (Signed)
Please advise 

## 2018-11-25 NOTE — Telephone Encounter (Signed)
Per patient, she has decided she would rather proceed with a hysterectomy, but is having gallbladder surgery in January and will call back when she is ready to schedule. Phone# and ext given.

## 2018-11-30 NOTE — Telephone Encounter (Signed)
Patient is interested in combining the surgeries, and will have the general surgeon's office contact us for scheduling.

## 2018-11-30 NOTE — Telephone Encounter (Signed)
Let her know can try to do both surgeries at one time if desired, would coordinate with general surgeon.  OK to wait and do one then the other as well.  Just an offering.

## 2018-12-25 ENCOUNTER — Encounter: Payer: Self-pay | Admitting: Obstetrics & Gynecology

## 2019-01-04 ENCOUNTER — Other Ambulatory Visit: Payer: Self-pay

## 2019-01-04 ENCOUNTER — Ambulatory Visit (INDEPENDENT_AMBULATORY_CARE_PROVIDER_SITE_OTHER): Payer: BLUE CROSS/BLUE SHIELD | Admitting: General Surgery

## 2019-01-04 ENCOUNTER — Telehealth: Payer: Self-pay | Admitting: *Deleted

## 2019-01-04 ENCOUNTER — Encounter: Payer: Self-pay | Admitting: General Surgery

## 2019-01-04 VITALS — BP 120/86 | HR 76 | Temp 97.8°F | Ht 65.0 in | Wt 270.0 lb

## 2019-01-04 DIAGNOSIS — K8020 Calculus of gallbladder without cholecystitis without obstruction: Secondary | ICD-10-CM | POA: Diagnosis not present

## 2019-01-04 NOTE — Patient Instructions (Signed)

## 2019-01-04 NOTE — Telephone Encounter (Signed)
We need to coordinate surgery between Dr. Celine Ahr and Dr. Kenton Kingfisher with [facility].   Patient wants to have surgery near the end of the week-possibly on a Friday.   I have left a message for Roann at [facility] to call back in regards to scheduling.

## 2019-01-04 NOTE — Progress Notes (Signed)
Patient ID: Debbie Townsend, female   DOB: Apr 05, 1986, 33 y.o.   MRN: [id number]  Chief Complaint  Patient presents with  . Abdominal Pain    HPI Debbie Townsend is a 33 y.o. female.  She is here today to discuss right upper quadrant abdominal pain.  She states that she initially had pain in the right upper quadrant with her pregnancy approximately 3 years ago.  At that time, she was diagnosed with gallbladder disease and was supposed to have surgery after the birth of her daughter.  Unfortunately, her daughter was born premature and the subsequent care of her child took precedence over surgery.  She states that she continues to have intermittent flareups.  The pain is not constant but when it occurs it is severe and in the right upper quadrant.  It does not seem to radiate anywhere.  She reports having done some online research and tried to identify foods that should be "safe" for people with gallbladder disease.  She says that this does not really help and that any food can trigger this.  She says that her attacks are sometimes accompanied by nausea emesis of yellow bile and undigested food, as well as diarrhea.  She says that the only thing that sometimes helps, is a rice heating bag applied with some pressure to the right upper quadrant.  She says this does not completely relieve the pain but lessens it.  She denies ever having had jaundice, pancreatitis, acholic stools, or tea colored urine.  She had a right upper quadrant ultrasound in March 2019 that demonstrated a gallbladder with numerous gallstones.  No evidence of cholecystitis.   Past Medical History:  Diagnosis Date  . Anxiety   . BRCA negative   . Depression   . Family history of anesthesia complication     mother had Post -op nausea and dizziness.  . Foot drop, left foot   . GERD (gastroesophageal reflux disease)   . Headache   . Hypertension   . Kidney stones   . Pseudotumor cerebri 2007 or 2009    Past Surgical History:   Procedure Laterality Date  . CESAREAN SECTION     2009/2017  . LUMBAR LAMINECTOMY/DECOMPRESSION MICRODISCECTOMY Left 09/22/2014   Procedure: Left Lumbar four-five microdiskectomy;  Surgeon: Consuella Lose, MD;  Location: [facility];  Service: Neurosurgery;  Laterality: Left;  Left Lumbar four-five microdiskectomy    Family History  Problem Relation Age of Onset  . Cancer Mother        breast dx'ed age 13/49  . Hypertension Mother   . Diabetes Mother   . Hypothyroidism Mother   . Depression Mother   . Breast cancer Mother        61s  . Liver disease Father   . Heart attack Father   . Arthritis Father   . Alcohol abuse Father   . Depression Father   . Drug abuse Father   . Early death Father   . Heart disease Father   . Hypertension Father   . Depression Sister   . Hypertension Sister   . Arthritis Sister   . Hypertension Sister   . Cancer Maternal Grandmother        stomach>liver>brain met  . Cancer Maternal Uncle        lung not a smoker     Social History Social History   Tobacco Use  . Smoking status: Current Every Day Smoker    Types: Cigarettes  . Smokeless tobacco: Never  Used  Substance Use Topics  . Alcohol use: No    Comment: " occasionally"  . Drug use: No    Allergies  Allergen Reactions  . Amoxicillin Hives, Itching, Swelling and Dermatitis    Hives   . Other Itching and Swelling    Almonds-mouth swells and itches  . Apple Other (See Comments)    Lips itch and swell, inside of mouth gets hives    Current Outpatient Medications  Medication Sig Dispense Refill  . ALPRAZolam (XANAX) 0.25 MG tablet Take 1 tablet (0.25 mg total) by mouth daily as needed for anxiety. (Patient not taking: Reported on 09/10/2018) 30 tablet 0  . lisinopril (PRINIVIL,ZESTRIL) 20 MG tablet Take 1 tablet (20 mg total) by mouth daily. In am 90 tablet 3  . sertraline (ZOLOFT) 50 MG tablet Take 1.5 tablets (75 mg total) by mouth daily. 140 tablet 1   No current  facility-administered medications for this visit.     Review of Systems Review of Systems  Constitutional: Positive for unexpected weight change.  HENT: Negative.   Eyes: Negative.   Respiratory: Negative.   Cardiovascular: Negative.   Gastrointestinal: Positive for abdominal distention, abdominal pain, diarrhea, nausea and vomiting.  Endocrine: Negative.   Genitourinary: Negative.   Musculoskeletal: Negative.   Skin: Negative.   Neurological: Negative.   Hematological: Negative.   Psychiatric/Behavioral: Negative.     Blood pressure 120/86, pulse 76, temperature 97.8 F (36.6 C), temperature source Skin, height '5\' 5"'  (1.651 m), weight 270 lb (122.5 kg), SpO2 98 %.  Physical Exam Physical Exam Constitutional:      General: She is not in acute distress.    Appearance: She is obese.  HENT:     Head: Normocephalic and atraumatic.     Mouth/Throat:     Mouth: Mucous membranes are moist.     Pharynx: Oropharynx is clear.  Eyes:     General: No scleral icterus.    Extraocular Movements: Extraocular movements intact.  Cardiovascular:     Rate and Rhythm: Normal rate and regular rhythm.  Pulmonary:     Effort: Pulmonary effort is normal.     Breath sounds: Normal breath sounds.  Abdominal:     General: Abdomen is protuberant. Bowel sounds are normal.     Palpations: Abdomen is soft. There is no mass.     Tenderness: There is abdominal tenderness in the right upper quadrant. There is no guarding. Negative signs include Murphy's sign.     Hernia: No hernia is present.     Comments: Exam difficult secondary to body habitus.  Genitourinary:    Comments: deferred Skin:    General: Skin is warm and dry.  Neurological:     General: No focal deficit present.     Mental Status: She is alert.  Psychiatric:        Mood and Affect: Mood normal.        Behavior: Behavior normal.     Data Reviewed I personally reviewed the right upper quadrant ultrasound performed in March  2019.  Stones are present within the gallbladder. CLINICAL DATA:  Right upper quadrant pain for 1 year  EXAM: ABDOMEN ULTRASOUND COMPLETE  COMPARISON:  11/28/2014 right upper quadrant ultrasound  FINDINGS: Gallbladder: Wall echo shadow appearance consistent with numerous calculi. Most of the gallbladder wall is not visible due to the degree of shadowing. There is no sonographic Murphy sign.  Common bile duct: Diameter: 4 mm. Where visualized, no filling defect.  Liver: Hepatic steatosis with  diffuse increased echogenicity and diminished acoustic penetration. Portal vein is patent on color Doppler imaging with normal direction of blood flow towards the liver.  IVC: No abnormality visualized.  Pancreas: Visualized portion unremarkable.  Spleen: Size and appearance within normal limits.  Right Kidney: Length: 12.5 cm. Echogenicity within normal limits. No mass or hydronephrosis visualized.  Left Kidney: Length: 11.8 cm. Echogenicity within normal limits. No mass or hydronephrosis visualized.  Abdominal aorta: No aneurysm visualized.  IMPRESSION: 1. Gallstones fill the gallbladder and obscure most of the gallbladder wall. 2. Hepatic steatosis.  Results for AMYLIA, COLLAZOS (MRN [id number]) as of 01/04/2019 15:39  Ref. Range 02/23/2018 08:42  Sodium Latest Ref Range: 134 - 144 mmol/L 141  Potassium Latest Ref Range: 3.5 - 5.2 mmol/L 4.9  Chloride Latest Ref Range: 96 - 106 mmol/L 104  Glucose Latest Ref Range: 65 - 99 mg/dL 82  BUN Latest Ref Range: 6 - 20 mg/dL 14  Creatinine Latest Ref Range: 0.57 - 1.00 mg/dL 0.79  Calcium Latest Ref Range: 8.7 - 10.2 mg/dL 8.8  BUN/Creatinine Ratio Latest Ref Range: 9 - 23  18  Phosphorus Latest Ref Range: 2.5 - 4.5 mg/dL 3.9  Alkaline Phosphatase Latest Ref Range: 39 - 117 IU/L 44  Albumin Latest Ref Range: 3.5 - 5.5 g/dL 3.9  Albumin/Globulin Ratio Latest Ref Range: 1.2 - 2.2  1.4  Uric Acid Latest Ref Range: 2.5 - 7.1  mg/dL 5.0  AST Latest Ref Range: 0 - 40 IU/L 19  ALT Latest Ref Range: 0 - 32 IU/L 23  Total Protein Latest Ref Range: 6.0 - 8.5 g/dL 6.7  Total Bilirubin Latest Ref Range: 0.0 - 1.2 mg/dL <0.2  GGT Latest Ref Range: 0 - 60 IU/L 18  GFR, Est Non African American Latest Ref Range: >59 mL/min/1.73 99  GFR, Est African American Latest Ref Range: >59 mL/min/1.73 115  Estimated CHD Risk Latest Ref Range: 0.0 - 1.0 times avg. 0.5  LDH Latest Ref Range: 119 - 226 IU/L 390 (H)  Total CHOL/HDL Ratio Latest Ref Range: 0.0 - 4.4 ratio 3.4  Cholesterol, Total Latest Ref Range: 100 - 199 mg/dL 126  HDL Cholesterol Latest Ref Range: >39 mg/dL 37 (L)  LDL (calc) Latest Ref Range: 0 - 99 mg/dL 76  Triglycerides Latest Ref Range: 0 - 149 mg/dL 67  VLDL Cholesterol Cal Latest Ref Range: 5 - 40 mg/dL 13  Iron Latest Ref Range: 27 - 159 ug/dL 40  The most recent labs available for review dating back to March 2019.  There is no evidence of biliary obstruction or liver dysfunction based upon these labs.  Assessment    This is a 33 year old woman with symptomatic cholelithiasis.  She desires surgical removal due to ongoing attacks of biliary colic.  I believe she would benefit from cholecystectomy.  Due to her morbid obesity, the operation will be more complicated.    Plan    I discussed the procedure in detail.  We discussed the risks and benefits of a laparoscopic cholecystectomy and possible cholangiogram including, but not limited to: bleeding, infection, injury to surrounding structures such as the intestine or liver, bile leak, retained gallstones, need to convert to an open procedure, prolonged diarrhea, blood clots such as DVT, common bile duct injury, anesthesia risks, and possible need for additional procedures. The patient had the opportunity to ask any questions and these were answered to her satisfaction.  She was specifically concerned about the possibility of surgical site infection due to her  body  habitus.  I acknowledged that this was certainly more of a risk as well as the possibility of skin level wound dehiscence secondary to tension on the incision sites due to her abdominal girth.  In addition, she is planning to undergo a hysterectomy with Dr. Kenton Kingfisher.  She would like to coordinate these procedures if possible.  We will contact his office and try to find an amenable date.  For my part of the operation, I will require bariatric table and leg support as well as having bariatric instruments available, again due to the patient's morbid obesity.        Tylicia Sherman 01/04/2019, 3:31 PM

## 2019-01-04 NOTE — Addendum Note (Signed)
Addended by: Fredirick Maudlin on: 01/04/2019 04:26 PM   Modules accepted: Orders, SmartSet

## 2019-01-05 ENCOUNTER — Telehealth: Payer: Self-pay | Admitting: Obstetrics & Gynecology

## 2019-01-05 ENCOUNTER — Other Ambulatory Visit: Payer: Self-pay | Admitting: Obstetrics & Gynecology

## 2019-01-05 NOTE — Telephone Encounter (Signed)
Patient is aware of H&P at [facility] Friday, 01/29/19 @ 9:00am w/ Dr Kenton Kingfisher, Pre-admit Testing afterwards (10:00am requested), and OR on 02/04/19.  Patient is aware she may receive calls from the [facility]. Patient confirmed BCBS, and no secondary insurance. Patient was given the phone# to Same Day Surgery, and is aware she should call on 02/03/19 @ 1-3pm for her arrival time. Ext given.

## 2019-01-05 NOTE — Telephone Encounter (Signed)
-----   Message from Gae Dry, MD sent at 01/05/2019 10:44 AM EST ----- Regarding: SURGERY Surgery Booking Request Patient Full Name:  Debbie Townsend  MRN: [id number]  DOB: 04-25-1986  Surgeon: Hoyt Koch, MD  Requested Surgery Date and Time: 02/04/2019 in combination w Dr Celine Ahr for Cholecystectomy Primary Diagnosis AND Code: Menorrhagia, Adenomyosis Secondary Diagnosis and Code:  Surgical Procedure: TLH/BS L&D Notification: No Admission Status: same day surgery Length of Surgery: 1.5 hr Special Case Needs: No H&P: YES for consents Phone Interview???: no Interpreter: Language:  Medical Clearance: no Special Scheduling Instructions: no

## 2019-01-05 NOTE — Telephone Encounter (Signed)
Disregard

## 2019-01-05 NOTE — Progress Notes (Addendum)
Spoke with Izora Gala at [facility] about coordinating surgery with Dr Kenton Kingfisher for the patient. February 20th looks like the best day for this. They will pre admit the patient and notify of surgery date. They are aware the patient will be a 23 hour observation for Dr Glenford Peers part.

## 2019-01-06 NOTE — Telephone Encounter (Signed)
02/04/19 [facility]

## 2019-01-07 ENCOUNTER — Telehealth: Payer: Self-pay

## 2019-01-07 NOTE — Telephone Encounter (Signed)
Spoke with the patient and reviewed what to expect the day of surgery and also to remind about her pre admit testing at the hospital on 01/29/19 at 10:00 am. She is aware to call back with any questions.

## 2019-01-18 ENCOUNTER — Ambulatory Visit: Payer: Self-pay

## 2019-01-18 NOTE — Telephone Encounter (Signed)
Scheduled  With NP for tomorrow at 1:20 advised patient any signs of numbness loss of feeling or pain she should be evaluated immediately.

## 2019-01-18 NOTE — Telephone Encounter (Signed)
Pt c/o moderate bilateral arm and hand edema. Swelling started November 2019 but has worsened. Pt stated that she has gained 10-20 pounds in the last 2-3 months. Denies pain, redness or pain to the arms. Pt is scheduled to have gallbladder and hysterectomy 02/04/19.  Care advice given and pt verbalized understanding. Pt wanted to be seen today with PCP only. No openings today. Offered a Wednesday appointment but pt refused stating she will call back tomorrow to see if her PCP has any availability. Offered to make appointment with another provider but pt refused. Pt asked to be placed on a waiting list. This was done. Routing back to office for review.  Reason for Disposition . MODERATE arm swelling (e.g., puffiness or swollen feeling of entire arm)  Answer Assessment - Initial Assessment Questions 1. ONSET: "When did the swelling start?" (e.g., minutes, hours, days)     November 2019 2. LOCATION: "What part of the arm is swollen?"  "Are both arms swollen or just one arm?"     Whole arm and hands and fingers- both arms 3. SEVERITY: "How bad is the swelling?" (e.g., localized; mild, moderate, severe)   - LOCALIZED: Small area of puffiness or swelling on just one arm   - JOINT SWELLING: Swelling of one joint   - MILD: Puffiness or swelling of hand   - MODERATE: Puffiness or swollen feeling of entire arm    - SEVERE: All of arm looks swollen; pitting edema     moderate 4. REDNESS: "Does the swelling look red or infected?"     no 5. PAIN: "Is the swelling painful to touch?" If so, ask: "How painful is it?"   (Scale 1-10; mild, moderate or severe)     no 6. FEVER: "Do you have a fever?" If so, ask: "What is it, how was it measured, and when did it start?"      no 7. CAUSE: "What do you think is causing the arm swelling?"     gallbladder 8. MEDICAL HISTORY: "Do you have a history of heart failure, kidney disease, liver failure, or cancer?"     no 9. RECURRENT SYMPTOM: "Have you had arm swelling  before?" If so, ask: "When was the last time?" "What happened that time?"     no 10. OTHER SYMPTOMS: "Do you have any other symptoms?" (e.g., chest pain, difficulty breathing)       no 11. PREGNANCY: "Is there any chance you are pregnant?" "When was your last menstrual period?"       No LMP: last week  Protocols used: ARM SWELLING AND EDEMA-A-AH

## 2019-01-19 ENCOUNTER — Ambulatory Visit (INDEPENDENT_AMBULATORY_CARE_PROVIDER_SITE_OTHER): Payer: BLUE CROSS/BLUE SHIELD | Admitting: Family Medicine

## 2019-01-19 ENCOUNTER — Encounter: Payer: Self-pay | Admitting: Internal Medicine

## 2019-01-19 ENCOUNTER — Encounter: Payer: Self-pay | Admitting: Family Medicine

## 2019-01-19 VITALS — BP 116/82 | HR 77 | Temp 98.4°F | Resp 18 | Ht 67.0 in | Wt 275.4 lb

## 2019-01-19 DIAGNOSIS — R609 Edema, unspecified: Secondary | ICD-10-CM | POA: Diagnosis not present

## 2019-01-19 DIAGNOSIS — R635 Abnormal weight gain: Secondary | ICD-10-CM | POA: Diagnosis not present

## 2019-01-19 LAB — CBC WITH DIFFERENTIAL/PLATELET
Basophils Absolute: 0 10*3/uL (ref 0.0–0.1)
Basophils Relative: 0.5 % (ref 0.0–3.0)
Eosinophils Absolute: 0.2 10*3/uL (ref 0.0–0.7)
Eosinophils Relative: 2.1 % (ref 0.0–5.0)
HCT: 41.4 % (ref 36.0–46.0)
Hemoglobin: 13.6 g/dL (ref 12.0–15.0)
Lymphocytes Relative: 19.1 % (ref 12.0–46.0)
Lymphs Abs: 1.6 10*3/uL (ref 0.7–4.0)
MCHC: 32.9 g/dL (ref 30.0–36.0)
MCV: 83.4 fl (ref 78.0–100.0)
Monocytes Absolute: 0.4 10*3/uL (ref 0.1–1.0)
Monocytes Relative: 5.2 % (ref 3.0–12.0)
Neutro Abs: 6.1 10*3/uL (ref 1.4–7.7)
Neutrophils Relative %: 73.1 % (ref 43.0–77.0)
Platelets: 295 10*3/uL (ref 150.0–400.0)
RBC: 4.96 Mil/uL (ref 3.87–5.11)
RDW: 14 % (ref 11.5–15.5)
WBC: 8.4 10*3/uL (ref 4.0–10.5)

## 2019-01-19 LAB — COMPREHENSIVE METABOLIC PANEL
ALT: 26 U/L (ref 0–35)
AST: 17 U/L (ref 0–37)
Albumin: 4 g/dL (ref 3.5–5.2)
Alkaline Phosphatase: 41 U/L (ref 39–117)
BUN: 10 mg/dL (ref 6–23)
CO2: 29 mEq/L (ref 19–32)
Calcium: 9 mg/dL (ref 8.4–10.5)
Chloride: 102 mEq/L (ref 96–112)
Creatinine, Ser: 0.78 mg/dL (ref 0.40–1.20)
GFR: 84.9 mL/min (ref >60.00)
Glucose, Bld: 141 mg/dL — ABNORMAL HIGH (ref 70–99)
Potassium: 3.9 mEq/L (ref 3.5–5.1)
Sodium: 138 mEq/L (ref 135–145)
Total Bilirubin: 0.4 mg/dL (ref 0.2–1.2)
Total Protein: 6.7 g/dL (ref 6.0–8.3)

## 2019-01-19 NOTE — Progress Notes (Signed)
Subjective:    Patient ID: Debbie Townsend, female    DOB: [birth year], 33 y.o.   MRN: [id number]  HPI   Patient presents to clinic complaining of weight gain and swelling all over her body.  Patient states she feels swollen in her arms fingers and her abdomen legs.  Also states feels her face is swollen.  States she has felt that she is becoming more and more swollen since November 2019 & also has been steadily gaining weight since then.  Patient states she always watches her salt intake due to her hypertension.  Describes her skin is feeling tight at times and also has noticed her wedding ring has become tighter.  Patient states she has even tried to do a weight loss program including cutting back on calories and drinking protein shakes for a few weeks, but did not end up losing any weight.  Patient does have upcoming surgery to remove gallbladder due to gallstones and also is having hysterectomy at that time due to dysfunctional uterine bleeding.   Patient Active Problem List   Diagnosis Date Noted  . DUB (dysfunctional uterine bleeding) 09/10/2018  . Fatty liver 04/02/2018  . Gallstones 04/02/2018  . Heavy menses 04/02/2018  . Family history of breast cancer 04/02/2018  . Anxiety 04/02/2018  . BMI 40.0-44.9, adult ([place]) 01/04/2016  . Essential hypertension 11/13/2015  . Pseudotumor cerebri 11/13/2015  . HNP (herniated nucleus pulposus), lumbar 09/22/2014   Social History   Tobacco Use  . Smoking status: Current Every Day Smoker    Types: Cigarettes  . Smokeless tobacco: Never Used  Substance Use Topics  . Alcohol use: No    Comment: " occasionally"    Review of Systems   Constitutional: Negative for chills, fatigue and fever. +swollen all over body HENT: Negative for congestion, ear pain, sinus pain and sore throat.   Eyes: Negative.   Respiratory: Negative for cough, shortness of breath and wheezing.   Cardiovascular: Negative for chest pain, palpitations and leg  swelling.  Gastrointestinal: Negative for abdominal pain, diarrhea, nausea and vomiting. +ABD swelling/bloating  Genitourinary: Negative for dysuria, frequency and urgency.  Musculoskeletal: Negative for arthralgias and myalgias.  Skin: Negative for color change, pallor and rash.  Neurological: Negative for syncope, light-headedness and headaches.  Psychiatric/Behavioral: The patient is not nervous/anxious.       Objective:   Physical Exam Constitutional:      General: She is not in acute distress.    Appearance: She is not toxic-appearing or diaphoretic.  HENT:     Head: Normocephalic and atraumatic.     Nose: Nose normal.     Mouth/Throat:     Mouth: Mucous membranes are moist.  Eyes:     General: No scleral icterus.    Extraocular Movements: Extraocular movements intact.     Conjunctiva/sclera: Conjunctivae normal.     Pupils: Pupils are equal, round, and reactive to light.  Neck:     Musculoskeletal: Neck supple. No neck rigidity.  Cardiovascular:     Rate and Rhythm: Normal rate and regular rhythm.     Pulses: Normal pulses.     Heart sounds: Normal heart sounds.  Pulmonary:     Effort: Pulmonary effort is normal. No respiratory distress.     Breath sounds: Normal breath sounds. No wheezing, rhonchi or rales.  Abdominal:     General: Bowel sounds are normal. There is no distension.     Palpations: Abdomen is soft. There is no mass.  Tenderness: There is no abdominal tenderness. There is no guarding or rebound.     Hernia: No hernia is present.  Musculoskeletal: Normal range of motion.     Comments: Patient has no edema type of edema in her extremities.   Skin:    General: Skin is warm and dry.     Capillary Refill: Capillary refill takes less than 2 seconds.     Coloration: Skin is not jaundiced or pale.     Findings: No erythema.  Neurological:     Mental Status: She is alert and oriented to person, place, and time.  Psychiatric:        Mood and Affect: Mood  normal.        Behavior: Behavior normal.    Wt Readings from Last 3 Encounters:  01/04/19 270 lb (122.5 kg)  10/26/18 262 lb (118.8 kg)  09/10/18 261 lb 3.2 oz (118.5 kg)   Today's Vitals   01/19/19 1328  BP: 116/82  Pulse: 77  Resp: 18  Temp: 98.4 F (36.9 C)  TempSrc: Oral  SpO2: 96%  Weight: 275 lb 6.4 oz (124.9 kg)  Height: 5\' 7"  (1.702 m)   Body mass index is 43.13 kg/m.     Assessment & Plan:    A total of 25  minutes were spent face-to-face with the patient during this encounter and over half of that time was spent on counseling and coordination of care. The patient was counseled on possible causes of swelling, weight gain.   Swelling all over body - swelling is not pitting edema so I do not believe a water pill like a Lasix would do patient any good.  Wondering if the generalized swelling could somehow be related to patient's gallbladder dysfunction, causing issues with patient's digestion.  I especially believe that the gallbladder dysfunction could lead to the bloating/: Feeling she gets in her abdomen.  Weight gain - unclear reason for weight gain.  We will check some blood work including CBC, CMP and thyroid panel to further investigate.  Encouraged patient to continue watching her salt intake, following a healthy diet and doing regular exercise with goal of weight loss.  Also discussed with patient that is could be possible that some of these issues improve after her gallbladder and uterus are removed with upcoming surgery at the end of this month.

## 2019-01-20 LAB — THYROID PANEL WITH TSH
Free Thyroxine Index: 2 (ref 1.4–3.8)
T3 Uptake: 29 % (ref 22–35)
T4, Total: 6.8 ug/dL (ref 5.1–11.9)
TSH: 1.89 mIU/L

## 2019-01-22 ENCOUNTER — Encounter

## 2019-01-29 ENCOUNTER — Other Ambulatory Visit: Payer: Self-pay

## 2019-01-29 ENCOUNTER — Ambulatory Visit (INDEPENDENT_AMBULATORY_CARE_PROVIDER_SITE_OTHER): Payer: BLUE CROSS/BLUE SHIELD | Admitting: Obstetrics & Gynecology

## 2019-01-29 ENCOUNTER — Encounter
Admission: RE | Admit: 2019-01-29 | Discharge: 2019-01-29 | Disposition: A | Discharge status: Home / Self Care | Payer: BLUE CROSS/BLUE SHIELD | Source: Ambulatory Visit | Attending: Obstetrics & Gynecology | Admitting: Obstetrics & Gynecology

## 2019-01-29 ENCOUNTER — Encounter: Payer: Self-pay | Admitting: Obstetrics & Gynecology

## 2019-01-29 VITALS — BP 130/80 | Ht 66.0 in | Wt 271.0 lb

## 2019-01-29 DIAGNOSIS — N8000 Endometriosis of the uterus, unspecified: Secondary | ICD-10-CM

## 2019-01-29 DIAGNOSIS — N920 Excessive and frequent menstruation with regular cycle: Secondary | ICD-10-CM

## 2019-01-29 DIAGNOSIS — N946 Dysmenorrhea, unspecified: Secondary | ICD-10-CM | POA: Diagnosis not present

## 2019-01-29 DIAGNOSIS — Z01818 Encounter for other preprocedural examination: Secondary | ICD-10-CM | POA: Insufficient documentation

## 2019-01-29 DIAGNOSIS — Z6841 Body Mass Index (BMI) 40.0 and over, adult: Secondary | ICD-10-CM

## 2019-01-29 DIAGNOSIS — I10 Essential (primary) hypertension: Secondary | ICD-10-CM | POA: Diagnosis not present

## 2019-01-29 DIAGNOSIS — Z01810 Encounter for preprocedural cardiovascular examination: Secondary | ICD-10-CM | POA: Diagnosis not present

## 2019-01-29 HISTORY — DX: Personal history of urinary calculi: Z87.442

## 2019-01-29 LAB — TYPE AND SCREEN
ABO/RH(D): A POS
Antibody Screen: NEGATIVE

## 2019-01-29 NOTE — Patient Instructions (Signed)
Total Laparoscopic Hysterectomy, Care After °This sheet gives you information about how to care for yourself after your procedure. Your health care provider may also give you more specific instructions. If you have problems or questions, contact your health care provider. °What can I expect after the procedure? °After the procedure, it is common to have: °· Pain and bruising around your incisions. °· A sore throat, if a breathing tube was used during surgery. °· Fatigue. °· Poor appetite. °· Less interest in sex. °If your ovaries were also removed, it is also common to have symptoms of menopause such as hot flashes, night sweats, and lack of sleep (insomnia). °Follow these instructions at home: °Bathing °· Do not take baths, swim, or use a hot tub until your health care provider approves. You may need to only take showers for 2-3 weeks. °· Keep your bandage (dressing) dry until your health care provider says it can be removed. °Incision care ° °· Follow instructions from your health care provider about how to take care of your incisions. Make sure you: °? Wash your hands with soap and water before you change your dressing. If soap and water are not available, use hand sanitizer. °? Change your dressing as told by your health care provider. °? Leave stitches (sutures), skin glue, or adhesive strips in place. These skin closures may need to stay in place for 2 weeks or longer. If adhesive strip edges start to loosen and curl up, you may trim the loose edges. Do not remove adhesive strips completely unless your health care provider tells you to do that. °· Check your incision area every day for signs of infection. Check for: °? Redness, swelling, or pain. °? Fluid or blood. °? Warmth. °? Pus or a bad smell. °Activity °· Get plenty of rest and sleep. °· Do not lift anything that is heavier than 10 lbs (4.5 kg) for one month after surgery, or as long as told by your health care provider. °· Do not drive or use heavy  machinery while taking prescription pain medicine. °· Do not drive for 24 hours if you were given a medicine to help you relax (sedative). °· Return to your normal activities as told by your health care provider. Ask your health care provider what activities are safe for you. °Lifestyle ° °· Do not use any products that contain nicotine or tobacco, such as cigarettes and e-cigarettes. These can delay healing. If you need help quitting, ask your health care provider. °· Do not drink alcohol until your health care provider approves. °General instructions °· Do not douche, use tampons, or have sex for at least 6 weeks, or as told by your health care provider. °· Take over-the-counter and prescription medicines only as told by your health care provider. °· To monitor yourself for a fever, take your temperature at least once a day during recovery. °· If you struggle with physical or emotional changes after your procedure, speak with your health care provider or a therapist. °· To prevent or treat constipation while you are taking prescription pain medicine, your health care provider may recommend that you: °? Drink enough fluid to keep your urine clear or pale yellow. °? Take over-the-counter or prescription medicines. °? Eat foods that are high in fiber, such as fresh fruits and vegetables, whole grains, and beans. °? Limit foods that are high in fat and processed sugars, such as fried and sweet foods. °· Keep all follow-up visits as told by your health care provider.   This is important. °Contact a health care provider if: °· You have chills or a fever. °· You have redness, swelling, or pain around an incision. °· You have fluid or blood coming from an incision. °· Your incision feels warm to the touch. °· You have pus or a bad smell coming from an incision. °· An incision breaks open. °· You feel dizzy or light-headed. °· You have pain or bleeding when you urinate. °· You have diarrhea, nausea, or vomiting that does not  go away. °· You have abnormal vaginal discharge. °· You have a rash. °· You have pain that does not get better with medicine. °Get help right away if: °· You have a fever and your symptoms suddenly get worse. °· You have severe abdominal pain. °· You have chest pain. °· You have shortness of breath. °· You faint. °· You have pain, swelling, or redness on your leg. °· You have heavy vaginal bleeding with blood clots. °Summary °· After the procedure it is common to have abdominal pain. Your provider will give you medication for this. °· Do not take baths, swim, or use a hot tub until your health care provider approves. °· Do not lift anything that is heavier than 10 lbs (4.5 kg) for one month after surgery, or as long as told by your health care provider. °· Notify your provider if you have any signs or symptoms of infection after the procedure. °This information is not intended to replace advice given to you by your health care provider. Make sure you discuss any questions you have with your health care provider. °Document Released: 09/22/2013 Document Revised: 02/12/2017 Document Reviewed: 02/12/2017 °Elsevier Interactive Patient Education © 2019 [employer] ° °

## 2019-01-29 NOTE — H&P (View-Only) (Signed)
PRE-OPERATIVE HISTORY AND PHYSICAL EXAM  HPI:  Debbie Townsend is a 33 y.o. [id number] Patient's last menstrual period was 01/12/2019.; she is being admitted for surgery related to abnormal uterine bleeding.  She complains of menorrhagia that  began several months ago now and its severity is described as severe.  She has regular periods every 30 days and they are associated with moderate menstrual cramping.  She has used the following for attempts at control: maxi pad.  Prior diagnosis: adenomyosis based on [place]. Previous Treatment: OCP in past but did not tolerate. BC- pull out method for decades  PMHx: Past Medical History:  Diagnosis Date  . Anxiety   . BRCA negative   . Depression   . Family history of anesthesia complication     mother had Post -op nausea and dizziness.  . Foot drop, left foot   . GERD (gastroesophageal reflux disease)   . Headache   . Hypertension   . Kidney stones   . Pseudotumor cerebri 2007 or 2009   Past Surgical History:  Procedure Laterality Date  . CESAREAN SECTION     2009/2017  . LUMBAR LAMINECTOMY/DECOMPRESSION MICRODISCECTOMY Left 09/22/2014   Procedure: Left Lumbar four-five microdiskectomy;  Surgeon: Consuella Lose, MD;  Location: [facility];  Service: Neurosurgery;  Laterality: Left;  Left Lumbar four-five microdiskectomy   Family History  Problem Relation Age of Onset  . Cancer Mother        breast dx'ed age 40/49  . Hypertension Mother   . Diabetes Mother   . Hypothyroidism Mother   . Depression Mother   . Breast cancer Mother        56s  . Liver disease Father   . Heart attack Father   . Arthritis Father   . Alcohol abuse Father   . Depression Father   . Drug abuse Father   . Early death Father   . Heart disease Father   . Hypertension Father   . Depression Sister   . Hypertension Sister   . Arthritis Sister   . Hypertension Sister   . Cancer Maternal Grandmother        stomach>liver>brain met  . Cancer Maternal Uncle        lung not a smoker    Social History   Tobacco Use  . Smoking status: Current Every Day Smoker    Types: Cigarettes  . Smokeless tobacco: Never Used  Substance Use Topics  . Alcohol use: No    Comment: " occasionally"  . Drug use: No    Current Outpatient Medications:  .  lisinopril (PRINIVIL,ZESTRIL) 20 MG tablet, Take 1 tablet (20 mg total) by mouth daily. In am (Patient taking differently: Take 20 mg by mouth daily. ), Disp: 90 tablet, Rfl: 3 .  sertraline (ZOLOFT) 50 MG tablet, Take 1.5 tablets (75 mg total) by mouth daily., Disp: 140 tablet, Rfl: 1 .  ALPRAZolam (XANAX) 0.25 MG tablet, Take 1 tablet (0.25 mg total) by mouth daily as needed for anxiety. (Patient not taking: Reported on 01/29/2019), Disp: 30 tablet, Rfl: 0 .  APPLE CIDER VINEGAR PO, Take 1 tablet by mouth daily., Disp: , Rfl:  .  Ascorbic Acid (VITAMIN C) 500 MG CAPS, Take 500 mg by mouth daily., Disp: , Rfl:  .  Omega-3 Fatty Acids (FISH OIL PO), Take 1 capsule by mouth daily., Disp: , Rfl:  .  OVER THE COUNTER MEDICATION, Take 3 tablets by mouth daily. Doterra, Disp: , Rfl:  .  zinc gluconate 50 MG tablet, Take 50 mg by mouth daily., Disp: , Rfl:  Allergies: Amoxicillin; Carrot [daucus carota]; Other; and Apple  Review of Systems  Constitutional: Negative for chills, fever and malaise/fatigue.  HENT: Negative for congestion, sinus pain and sore throat.   Eyes: Negative for blurred vision and pain.  Respiratory: Negative for cough and wheezing.   Cardiovascular: Negative for chest pain and leg swelling.  Gastrointestinal: Negative for abdominal pain, constipation, diarrhea, heartburn, nausea and vomiting.  Genitourinary: Negative for dysuria, frequency, hematuria and urgency.  Musculoskeletal: Negative for back pain, joint pain, myalgias and neck pain.  Skin: Negative for itching and rash.  Neurological: Negative for dizziness, tremors and weakness.  Endo/Heme/Allergies: Does not bruise/bleed easily.    Psychiatric/Behavioral: Negative for depression. The patient is not nervous/anxious and does not have insomnia.    Objective: BP 130/80   Ht _0  (1.676 m)   Wt 271 lb (122.9 kg)   LMP 01/12/2019   BMI 43.74 kg/m   Filed Weights   01/29/19 0857  Weight: 271 lb (122.9 kg)   Physical Exam Constitutional:      General: She is not in acute distress.    Appearance: She is well-developed.  HENT:     Head: Normocephalic and atraumatic. No laceration.     Right Ear: Hearing normal.     Left Ear: Hearing normal.     Mouth/Throat:     Pharynx: Uvula midline.  Eyes:     Pupils: Pupils are equal, round, and reactive to light.  Neck:     Musculoskeletal: Normal range of motion and neck supple.     Thyroid: No thyromegaly.  Cardiovascular:     Rate and Rhythm: Normal rate and regular rhythm.     Heart sounds: No murmur. No friction rub. No gallop.   Pulmonary:     Effort: Pulmonary effort is normal. No respiratory distress.     Breath sounds: Normal breath sounds. No wheezing.  Chest:     Breasts:        Right: No mass, skin change or tenderness.        Left: No mass, skin change or tenderness.  Abdominal:     General: Bowel sounds are normal. There is no distension.     Palpations: Abdomen is soft.     Tenderness: There is no abdominal tenderness. There is no rebound.  Musculoskeletal: Normal range of motion.  Neurological:     Mental Status: She is alert and oriented to person, place, and time.     Cranial Nerves: No cranial nerve deficit.  Skin:    General: Skin is warm and dry.  Psychiatric:        Judgment: Judgment normal.  Vitals signs reviewed.   Assessment: 1. Menorrhagia with regular cycle   2. Dysmenorrhea   3. Uterus, adenomyosis   Options discussed, prefers laparoscopic hysterectomy; preservation of ovaries. Plan surgery w Dr Celine Ahr for Cholecystectomy  I have had a careful discussion with this patient about all the options available and the risk/benefits  of each. I have fully informed this patient that surgery may subject her to a variety of discomforts and risks: She understands that most patients have surgery with little difficulty, but problems can happen ranging from minor to fatal. These include nausea, vomiting, pain, bleeding, infection, poor healing, hernia, or formation of adhesions. Unexpected reactions may occur from any drug or anesthetic given. Unintended injury may occur to other pelvic or abdominal structures such as Fallopian  tubes, ovaries, bladder, ureter (tube from kidney to bladder), or bowel. Nerves going from the pelvis to the legs may be injured. Any such injury may require immediate or later additional surgery to correct the problem. Excessive blood loss requiring transfusion is very unlikely but possible. Dangerous blood clots may form in the legs or lungs. Physical and sexual activity will be restricted in varying degrees for an indeterminate period of time but most often 2-6 weeks.  Finally, she understands that it is impossible to list every possible undesirable effect and that the condition for which surgery is done is not always cured or significantly improved, and in rare cases may be even worse.Ample time was given to answer all questions.  Barnett Applebaum, MD, [facility], [facility] 01/29/2019  9:39 AM

## 2019-01-29 NOTE — Pre-Procedure Instructions (Signed)
Called Dr. Ronelle Nigh notified of BMI of 43.6 and history of HTN.  States not necessary to see patient in Pre Admit.

## 2019-01-29 NOTE — Progress Notes (Signed)
  PRE-OPERATIVE HISTORY AND PHYSICAL EXAM  HPI:  Debbie Townsend is a 32 y.o. G2P1102 Patient's last menstrual period was 01/12/2019.; she is being admitted for surgery related to abnormal uterine bleeding.  She complains of menorrhagia that  began several months ago now and its severity is described as severe.  She has regular periods every 30 days and they are associated with moderate menstrual cramping.  She has used the following for attempts at control: maxi pad.  Prior diagnosis: adenomyosis based on US. Previous Treatment: OCP in past but did not tolerate. BC- pull out method for decades  PMHx: Past Medical History:  Diagnosis Date  . Anxiety   . BRCA negative   . Depression   . Family history of anesthesia complication     mother had Post -op nausea and dizziness.  . Foot drop, left foot   . GERD (gastroesophageal reflux disease)   . Headache   . Hypertension   . Kidney stones   . Pseudotumor cerebri 2007 or 2009   Past Surgical History:  Procedure Laterality Date  . CESAREAN SECTION     2009/2017  . LUMBAR LAMINECTOMY/DECOMPRESSION MICRODISCECTOMY Left 09/22/2014   Procedure: Left Lumbar four-five microdiskectomy;  Surgeon: Neelesh Nundkumar, MD;  Location: [facility];  Service: Neurosurgery;  Laterality: Left;  Left Lumbar four-five microdiskectomy   Family History  Problem Relation Age of Onset  . Cancer Mother        breast dx'ed age 48/49  . Hypertension Mother   . Diabetes Mother   . Hypothyroidism Mother   . Depression Mother   . Breast cancer Mother        60s  . Liver disease Father   . Heart attack Father   . Arthritis Father   . Alcohol abuse Father   . Depression Father   . Drug abuse Father   . Early death Father   . Heart disease Father   . Hypertension Father   . Depression Sister   . Hypertension Sister   . Arthritis Sister   . Hypertension Sister   . Cancer Maternal Grandmother        stomach>liver>brain met  . Cancer Maternal Uncle        lung not a smoker    Social History   Tobacco Use  . Smoking status: Current Every Day Smoker    Types: Cigarettes  . Smokeless tobacco: Never Used  Substance Use Topics  . Alcohol use: No    Comment: " occasionally"  . Drug use: No    Current Outpatient Medications:  .  lisinopril (PRINIVIL,ZESTRIL) 20 MG tablet, Take 1 tablet (20 mg total) by mouth daily. In am (Patient taking differently: Take 20 mg by mouth daily. ), Disp: 90 tablet, Rfl: 3 .  sertraline (ZOLOFT) 50 MG tablet, Take 1.5 tablets (75 mg total) by mouth daily., Disp: 140 tablet, Rfl: 1 .  ALPRAZolam (XANAX) 0.25 MG tablet, Take 1 tablet (0.25 mg total) by mouth daily as needed for anxiety. (Patient not taking: Reported on 01/29/2019), Disp: 30 tablet, Rfl: 0 .  APPLE CIDER VINEGAR PO, Take 1 tablet by mouth daily., Disp: , Rfl:  .  Ascorbic Acid (VITAMIN C) 500 MG CAPS, Take 500 mg by mouth daily., Disp: , Rfl:  .  Omega-3 Fatty Acids (FISH OIL PO), Take 1 capsule by mouth daily., Disp: , Rfl:  .  OVER THE COUNTER MEDICATION, Take 3 tablets by mouth daily. Doterra, Disp: , Rfl:  .    zinc gluconate 50 MG tablet, Take 50 mg by mouth daily., Disp: , Rfl:  Allergies: Amoxicillin; Carrot [daucus carota]; Other; and Apple  Review of Systems  Constitutional: Negative for chills, fever and malaise/fatigue.  HENT: Negative for congestion, sinus pain and sore throat.   Eyes: Negative for blurred vision and pain.  Respiratory: Negative for cough and wheezing.   Cardiovascular: Negative for chest pain and leg swelling.  Gastrointestinal: Negative for abdominal pain, constipation, diarrhea, heartburn, nausea and vomiting.  Genitourinary: Negative for dysuria, frequency, hematuria and urgency.  Musculoskeletal: Negative for back pain, joint pain, myalgias and neck pain.  Skin: Negative for itching and rash.  Neurological: Negative for dizziness, tremors and weakness.  Endo/Heme/Allergies: Does not bruise/bleed easily.    Psychiatric/Behavioral: Negative for depression. The patient is not nervous/anxious and does not have insomnia.    Objective: BP 130/80   Ht 5' 6" (1.676 m)   Wt 271 lb (122.9 kg)   LMP 01/12/2019   BMI 43.74 kg/m   Filed Weights   01/29/19 0857  Weight: 271 lb (122.9 kg)   Physical Exam Constitutional:      General: She is not in acute distress.    Appearance: She is well-developed.  HENT:     Head: Normocephalic and atraumatic. No laceration.     Right Ear: Hearing normal.     Left Ear: Hearing normal.     Mouth/Throat:     Pharynx: Uvula midline.  Eyes:     Pupils: Pupils are equal, round, and reactive to light.  Neck:     Musculoskeletal: Normal range of motion and neck supple.     Thyroid: No thyromegaly.  Cardiovascular:     Rate and Rhythm: Normal rate and regular rhythm.     Heart sounds: No murmur. No friction rub. No gallop.   Pulmonary:     Effort: Pulmonary effort is normal. No respiratory distress.     Breath sounds: Normal breath sounds. No wheezing.  Chest:     Breasts:        Right: No mass, skin change or tenderness.        Left: No mass, skin change or tenderness.  Abdominal:     General: Bowel sounds are normal. There is no distension.     Palpations: Abdomen is soft.     Tenderness: There is no abdominal tenderness. There is no rebound.  Musculoskeletal: Normal range of motion.  Neurological:     Mental Status: She is alert and oriented to person, place, and time.     Cranial Nerves: No cranial nerve deficit.  Skin:    General: Skin is warm and dry.  Psychiatric:        Judgment: Judgment normal.  Vitals signs reviewed.   Assessment: 1. Menorrhagia with regular cycle   2. Dysmenorrhea   3. Uterus, adenomyosis   Options discussed, prefers laparoscopic hysterectomy; preservation of ovaries. Plan surgery w Dr Cannon for Cholecystectomy  I have had a careful discussion with this patient about all the options available and the risk/benefits  of each. I have fully informed this patient that surgery may subject her to a variety of discomforts and risks: She understands that most patients have surgery with little difficulty, but problems can happen ranging from minor to fatal. These include nausea, vomiting, pain, bleeding, infection, poor healing, hernia, or formation of adhesions. Unexpected reactions may occur from any drug or anesthetic given. Unintended injury may occur to other pelvic or abdominal structures such as Fallopian   tubes, ovaries, bladder, ureter (tube from kidney to bladder), or bowel. Nerves going from the pelvis to the legs may be injured. Any such injury may require immediate or later additional surgery to correct the problem. Excessive blood loss requiring transfusion is very unlikely but possible. Dangerous blood clots may form in the legs or lungs. Physical and sexual activity will be restricted in varying degrees for an indeterminate period of time but most often 2-6 weeks.  Finally, she understands that it is impossible to list every possible undesirable effect and that the condition for which surgery is done is not always cured or significantly improved, and in rare cases may be even worse.Ample time was given to answer all questions.  Paul Dimitria Ketchum, MD, FACOG [facility], [facility] 01/29/2019  9:39 AM  

## 2019-01-29 NOTE — Patient Instructions (Signed)
Your procedure is scheduled on: Thurs 2/20 Report to Day Surgery. 2nd floor medical mall To find out your arrival time please call [phone] between [place] on Wed. 2/19  Remember: Instructions that are not followed completely may result in serious medical risk,  up to and including death, or upon the discretion of your surgeon and anesthesiologist your  surgery may need to be rescheduled.     _X__ 1. Do not eat food after midnight the night before your procedure.                 No gum chewing or hard candies. You may drink clear liquids up to 2 hours                 before you are scheduled to arrive for your surgery- DO not drink clear                 liquids within 2 hours of the start of your surgery.                 Clear Liquids include:  water, apple juice without pulp, clear carbohydrate                 drink such as Clearfast of Gatorade, Black Coffee or Tea (Do not add                 anything to coffee or tea).   complete the CHO drink by 4:#0  __X__2.  On the morning of surgery brush your teeth with toothpaste and water, you                may rinse your mouth with mouthwash if you wish.  Do not swallow any toothpaste of mouthwash.     _X__ 3.  No Alcohol for 24 hours before or after surgery.   _X__ 4.  Do Not Smoke or use e-cigarettes For 24 Hours Prior to Your Surgery.                 Do not use any chewable tobacco products for at least 6 hours prior to                 surgery.  ____  5.  Bring all medications with you on the day of surgery if instructed.   __x__  6.  Notify your doctor if there is any change in your medical condition      (cold, fever, infections).     Do not wear jewelry, make-up, hairpins, clips or nail polish. Do not wear lotions, powders, or perfumes. You may wear deodorant. Do not shave 48 hours prior to surgery. Men may shave face and neck. Do not bring valuables to the hospital.    [facility] is not responsible  for any belongings or valuables.  Contacts, dentures or bridgework may not be worn into surgery. Leave your suitcase in the car. After surgery it may be brought to your room. For patients admitted to the hospital, discharge time is determined by your treatment team.   Patients discharged the day of surgery will not be allowed to drive home.   Please read over the following fact sheets that you were given:    __x__ Take these medicines the morning of surgery with A SIP OF WATER:    1. sertraline (ZOLOFT) 50 MG tablet  2.   3.   4.  5.  6.  ____ Fleet Enema (as directed)  __x__ Use CHG Soap as directed  ____ Use inhalers on the day of surgery  ____ Stop metformin 2 days prior to surgery    ____ Take 1/2 of usual insulin dose the night before surgery. No insulin the morning          of surgery.   ____ Stop Coumadin/Plavix/aspirin on   __x__ Stop Anti-inflammatories today  May take tylenol   ____ Stop supplements until after surgery.    ____ Bring C-Pap to the hospital.

## 2019-02-03 ENCOUNTER — Encounter: Payer: Self-pay | Admitting: Obstetrics & Gynecology

## 2019-02-04 ENCOUNTER — Encounter: Payer: Self-pay | Admitting: *Deleted

## 2019-02-04 ENCOUNTER — Ambulatory Visit
Admission: RE | Admit: 2019-02-04 | Discharge: 2019-02-04 | Disposition: A | Discharge status: Home / Self Care | Payer: BLUE CROSS/BLUE SHIELD | Attending: General Surgery | Admitting: General Surgery

## 2019-02-04 ENCOUNTER — Encounter
Admission: RE | Disposition: A | Discharge status: Home / Self Care | Payer: Self-pay | Source: Home / Self Care | Attending: General Surgery

## 2019-02-04 ENCOUNTER — Other Ambulatory Visit: Payer: Self-pay

## 2019-02-04 ENCOUNTER — Ambulatory Visit: Payer: BLUE CROSS/BLUE SHIELD | Admitting: Anesthesiology

## 2019-02-04 DIAGNOSIS — R51 Headache: Secondary | ICD-10-CM | POA: Insufficient documentation

## 2019-02-04 DIAGNOSIS — D251 Intramural leiomyoma of uterus: Secondary | ICD-10-CM | POA: Insufficient documentation

## 2019-02-04 DIAGNOSIS — N8003 Adenomyosis of the uterus: Secondary | ICD-10-CM | POA: Diagnosis present

## 2019-02-04 DIAGNOSIS — Z818 Family history of other mental and behavioral disorders: Secondary | ICD-10-CM | POA: Insufficient documentation

## 2019-02-04 DIAGNOSIS — N939 Abnormal uterine and vaginal bleeding, unspecified: Secondary | ICD-10-CM | POA: Diagnosis not present

## 2019-02-04 DIAGNOSIS — Z801 Family history of malignant neoplasm of trachea, bronchus and lung: Secondary | ICD-10-CM | POA: Insufficient documentation

## 2019-02-04 DIAGNOSIS — Z8379 Family history of other diseases of the digestive system: Secondary | ICD-10-CM | POA: Insufficient documentation

## 2019-02-04 DIAGNOSIS — N920 Excessive and frequent menstruation with regular cycle: Secondary | ICD-10-CM | POA: Diagnosis not present

## 2019-02-04 DIAGNOSIS — K8020 Calculus of gallbladder without cholecystitis without obstruction: Secondary | ICD-10-CM | POA: Diagnosis not present

## 2019-02-04 DIAGNOSIS — F17210 Nicotine dependence, cigarettes, uncomplicated: Secondary | ICD-10-CM | POA: Diagnosis not present

## 2019-02-04 DIAGNOSIS — Z8249 Family history of ischemic heart disease and other diseases of the circulatory system: Secondary | ICD-10-CM | POA: Diagnosis not present

## 2019-02-04 DIAGNOSIS — N800 Endometriosis of uterus: Secondary | ICD-10-CM | POA: Diagnosis present

## 2019-02-04 DIAGNOSIS — K8010 Calculus of gallbladder with chronic cholecystitis without obstruction: Secondary | ICD-10-CM | POA: Insufficient documentation

## 2019-02-04 DIAGNOSIS — K828 Other specified diseases of gallbladder: Secondary | ICD-10-CM | POA: Insufficient documentation

## 2019-02-04 DIAGNOSIS — F329 Major depressive disorder, single episode, unspecified: Secondary | ICD-10-CM | POA: Diagnosis not present

## 2019-02-04 DIAGNOSIS — Z811 Family history of alcohol abuse and dependence: Secondary | ICD-10-CM | POA: Insufficient documentation

## 2019-02-04 DIAGNOSIS — I10 Essential (primary) hypertension: Secondary | ICD-10-CM | POA: Diagnosis not present

## 2019-02-04 DIAGNOSIS — Z803 Family history of malignant neoplasm of breast: Secondary | ICD-10-CM | POA: Insufficient documentation

## 2019-02-04 DIAGNOSIS — N938 Other specified abnormal uterine and vaginal bleeding: Secondary | ICD-10-CM | POA: Diagnosis present

## 2019-02-04 DIAGNOSIS — N841 Polyp of cervix uteri: Secondary | ICD-10-CM | POA: Insufficient documentation

## 2019-02-04 DIAGNOSIS — Z833 Family history of diabetes mellitus: Secondary | ICD-10-CM | POA: Insufficient documentation

## 2019-02-04 DIAGNOSIS — Z800 Family history of malignant neoplasm of digestive organs: Secondary | ICD-10-CM | POA: Diagnosis not present

## 2019-02-04 DIAGNOSIS — Z8261 Family history of arthritis: Secondary | ICD-10-CM | POA: Insufficient documentation

## 2019-02-04 DIAGNOSIS — M21372 Foot drop, left foot: Secondary | ICD-10-CM | POA: Diagnosis not present

## 2019-02-04 DIAGNOSIS — N809 Endometriosis, unspecified: Secondary | ICD-10-CM

## 2019-02-04 DIAGNOSIS — Z8349 Family history of other endocrine, nutritional and metabolic diseases: Secondary | ICD-10-CM | POA: Diagnosis not present

## 2019-02-04 DIAGNOSIS — N840 Polyp of corpus uteri: Secondary | ICD-10-CM | POA: Diagnosis not present

## 2019-02-04 DIAGNOSIS — N946 Dysmenorrhea, unspecified: Secondary | ICD-10-CM | POA: Insufficient documentation

## 2019-02-04 DIAGNOSIS — G932 Benign intracranial hypertension: Secondary | ICD-10-CM | POA: Diagnosis not present

## 2019-02-04 DIAGNOSIS — Z87442 Personal history of urinary calculi: Secondary | ICD-10-CM | POA: Diagnosis not present

## 2019-02-04 DIAGNOSIS — F419 Anxiety disorder, unspecified: Secondary | ICD-10-CM | POA: Insufficient documentation

## 2019-02-04 DIAGNOSIS — Z6841 Body Mass Index (BMI) 40.0 and over, adult: Secondary | ICD-10-CM | POA: Insufficient documentation

## 2019-02-04 DIAGNOSIS — Z79899 Other long term (current) drug therapy: Secondary | ICD-10-CM | POA: Insufficient documentation

## 2019-02-04 DIAGNOSIS — K760 Fatty (change of) liver, not elsewhere classified: Secondary | ICD-10-CM | POA: Insufficient documentation

## 2019-02-04 DIAGNOSIS — N83202 Unspecified ovarian cyst, left side: Secondary | ICD-10-CM | POA: Diagnosis not present

## 2019-02-04 DIAGNOSIS — K219 Gastro-esophageal reflux disease without esophagitis: Secondary | ICD-10-CM | POA: Diagnosis not present

## 2019-02-04 DIAGNOSIS — K811 Chronic cholecystitis: Secondary | ICD-10-CM | POA: Diagnosis not present

## 2019-02-04 HISTORY — DX: Adenomyosis of the uterus: N80.03

## 2019-02-04 HISTORY — PX: CHOLECYSTECTOMY: SHX55

## 2019-02-04 LAB — POCT PREGNANCY, URINE: Preg Test, Ur: NEGATIVE

## 2019-02-04 LAB — ABO/RH: ABO/RH(D): A POS

## 2019-02-04 SURGERY — LAPAROSCOPIC CHOLECYSTECTOMY
Anesthesia: General

## 2019-02-04 MED ORDER — MORPHINE SULFATE (PF) 4 MG/ML IV SOLN: 1.0000 mg | INTRAVENOUS | Status: DC | PRN

## 2019-02-04 MED ORDER — PROMETHAZINE HCL 25 MG/ML IJ SOLN
12.5000 mg | Freq: Once | INTRAMUSCULAR | Status: AC
  Administered 2019-02-04: 12.5 mg via INTRAVENOUS

## 2019-02-04 MED ORDER — KETOROLAC TROMETHAMINE 30 MG/ML IJ SOLN
INTRAMUSCULAR | Status: AC
  Administered 2019-02-04: 30 mg via INTRAVENOUS
  Filled 2019-02-04: qty 1

## 2019-02-04 MED ORDER — BUPIVACAINE HCL (PF) 0.25 % IJ SOLN
INTRAMUSCULAR | Status: AC
  Filled 2019-02-04: qty 30

## 2019-02-04 MED ORDER — ROCURONIUM BROMIDE 100 MG/10ML IV SOLN
INTRAVENOUS | Status: DC | PRN
  Administered 2019-02-04 (×2): 20 mg via INTRAVENOUS

## 2019-02-04 MED ORDER — CHLORHEXIDINE GLUCONATE CLOTH 2 % EX PADS: 6.0000 | MEDICATED_PAD | Freq: Once | CUTANEOUS | Status: DC

## 2019-02-04 MED ORDER — EVICEL 5 ML EX KIT
PACK | CUTANEOUS | Status: DC | PRN
  Administered 2019-02-04: 5 mL

## 2019-02-04 MED ORDER — ONDANSETRON HCL 4 MG/2ML IJ SOLN
INTRAMUSCULAR | Status: DC | PRN
  Administered 2019-02-04: 4 mg via INTRAVENOUS

## 2019-02-04 MED ORDER — GABAPENTIN 300 MG PO CAPS
300.0000 mg | ORAL_CAPSULE | ORAL | Status: AC
  Administered 2019-02-04: 300 mg via ORAL

## 2019-02-04 MED ORDER — FENTANYL CITRATE (PF) 250 MCG/5ML IJ SOLN
INTRAMUSCULAR | Status: AC
  Filled 2019-02-04: qty 5

## 2019-02-04 MED ORDER — FENTANYL CITRATE (PF) 100 MCG/2ML IJ SOLN
INTRAMUSCULAR | Status: AC
  Administered 2019-02-04: 25 ug via INTRAVENOUS
  Filled 2019-02-04: qty 2

## 2019-02-04 MED ORDER — LACTATED RINGERS IV SOLN: INTRAVENOUS | Status: DC

## 2019-02-04 MED ORDER — OXYCODONE HCL 5 MG/5ML PO SOLN: 5.0000 mg | Freq: Once | ORAL | Status: DC | PRN

## 2019-02-04 MED ORDER — ACETAMINOPHEN 500 MG PO TABS
ORAL_TABLET | ORAL | Status: AC
  Filled 2019-02-04: qty 2

## 2019-02-04 MED ORDER — PROPOFOL 10 MG/ML IV BOLUS
INTRAVENOUS | Status: AC
  Filled 2019-02-04: qty 40

## 2019-02-04 MED ORDER — CELECOXIB 200 MG PO CAPS
200.0000 mg | ORAL_CAPSULE | ORAL | Status: AC
  Administered 2019-02-04: 200 mg via ORAL

## 2019-02-04 MED ORDER — SUGAMMADEX SODIUM 200 MG/2ML IV SOLN
INTRAVENOUS | Status: DC | PRN
  Administered 2019-02-04: 300 mg via INTRAVENOUS

## 2019-02-04 MED ORDER — LACTATED RINGERS IV SOLN
INTRAVENOUS | Status: DC | PRN
  Administered 2019-02-04: 08:00:00 via INTRAVENOUS

## 2019-02-04 MED ORDER — LIDOCAINE-EPINEPHRINE 1 %-1:100000 IJ SOLN
INTRAMUSCULAR | Status: DC | PRN
  Administered 2019-02-04: 10 mL
  Administered 2019-02-04: 20 mL

## 2019-02-04 MED ORDER — PROMETHAZINE HCL 25 MG/ML IJ SOLN
INTRAMUSCULAR | Status: AC
  Administered 2019-02-04: 12.5 mg via INTRAVENOUS
  Filled 2019-02-04: qty 1

## 2019-02-04 MED ORDER — MIDAZOLAM HCL 2 MG/2ML IJ SOLN
INTRAMUSCULAR | Status: AC
  Filled 2019-02-04: qty 2

## 2019-02-04 MED ORDER — FAMOTIDINE 20 MG PO TABS
ORAL_TABLET | ORAL | Status: AC
  Filled 2019-02-04: qty 1

## 2019-02-04 MED ORDER — SODIUM CHLORIDE FLUSH 0.9 % IV SOLN
INTRAVENOUS | Status: AC
  Filled 2019-02-04: qty 10

## 2019-02-04 MED ORDER — DEXMEDETOMIDINE HCL 200 MCG/2ML IV SOLN
INTRAVENOUS | Status: DC | PRN
  Administered 2019-02-04: 12 ug via INTRAVENOUS

## 2019-02-04 MED ORDER — PHENYLEPHRINE HCL 10 MG/ML IJ SOLN
INTRAMUSCULAR | Status: DC | PRN
  Administered 2019-02-04 (×2): 100 ug via INTRAVENOUS

## 2019-02-04 MED ORDER — HYDROCODONE-ACETAMINOPHEN 5-325 MG PO TABS: 1.0000 | ORAL_TABLET | ORAL | Status: DC | PRN

## 2019-02-04 MED ORDER — SEVOFLURANE IN SOLN
RESPIRATORY_TRACT | Status: AC
  Filled 2019-02-04: qty 250

## 2019-02-04 MED ORDER — MIDAZOLAM HCL 2 MG/2ML IJ SOLN
INTRAMUSCULAR | Status: DC | PRN
  Administered 2019-02-04: 2 mg via INTRAVENOUS

## 2019-02-04 MED ORDER — FENTANYL CITRATE (PF) 100 MCG/2ML IJ SOLN
25.0000 ug | INTRAMUSCULAR | Status: DC | PRN
  Administered 2019-02-04: 25 ug via INTRAVENOUS

## 2019-02-04 MED ORDER — GLYCOPYRROLATE 0.2 MG/ML IJ SOLN
INTRAMUSCULAR | Status: DC | PRN
  Administered 2019-02-04: 0.2 mg via INTRAVENOUS

## 2019-02-04 MED ORDER — DEXAMETHASONE SODIUM PHOSPHATE 10 MG/ML IJ SOLN
INTRAMUSCULAR | Status: DC | PRN
  Administered 2019-02-04: 5 mg via INTRAVENOUS

## 2019-02-04 MED ORDER — LIDOCAINE HCL (CARDIAC) PF 100 MG/5ML IV SOSY
PREFILLED_SYRINGE | INTRAVENOUS | Status: DC | PRN
  Administered 2019-02-04 (×2): 100 mg via INTRAVENOUS

## 2019-02-04 MED ORDER — CELECOXIB 200 MG PO CAPS
ORAL_CAPSULE | ORAL | Status: AC
  Filled 2019-02-04: qty 1

## 2019-02-04 MED ORDER — ACETAMINOPHEN 650 MG RE SUPP
650.0000 mg | RECTAL | Status: DC | PRN
  Filled 2019-02-04: qty 1

## 2019-02-04 MED ORDER — KETOROLAC TROMETHAMINE 30 MG/ML IJ SOLN
30.0000 mg | Freq: Four times a day (QID) | INTRAMUSCULAR | Status: DC
  Administered 2019-02-04: 30 mg via INTRAVENOUS
  Filled 2019-02-04: qty 1

## 2019-02-04 MED ORDER — LIDOCAINE-EPINEPHRINE 1 %-1:100000 IJ SOLN
INTRAMUSCULAR | Status: AC
  Filled 2019-02-04: qty 1

## 2019-02-04 MED ORDER — OXYCODONE HCL 5 MG PO TABS: 5.0000 mg | ORAL_TABLET | Freq: Once | ORAL | Status: DC | PRN

## 2019-02-04 MED ORDER — PROPOFOL 10 MG/ML IV BOLUS
INTRAVENOUS | Status: AC
  Filled 2019-02-04: qty 20

## 2019-02-04 MED ORDER — CHLORHEXIDINE GLUCONATE CLOTH 2 % EX PADS
6.0000 | MEDICATED_PAD | Freq: Once | CUTANEOUS | Status: AC
  Administered 2019-02-04: 6 via TOPICAL

## 2019-02-04 MED ORDER — ACETAMINOPHEN 325 MG PO TABS: 650.0000 mg | ORAL_TABLET | ORAL | Status: DC | PRN

## 2019-02-04 MED ORDER — HYDROCODONE-ACETAMINOPHEN 5-325 MG PO TABS: 1.0000 | ORAL_TABLET | ORAL | 0 refills | Status: DC | PRN

## 2019-02-04 MED ORDER — PROPOFOL 10 MG/ML IV BOLUS
INTRAVENOUS | Status: DC | PRN
  Administered 2019-02-04: 200 mg via INTRAVENOUS

## 2019-02-04 MED ORDER — KETAMINE HCL 50 MG/ML IJ SOLN
INTRAMUSCULAR | Status: AC
  Filled 2019-02-04: qty 10

## 2019-02-04 MED ORDER — FENTANYL CITRATE (PF) 100 MCG/2ML IJ SOLN
INTRAMUSCULAR | Status: DC | PRN
  Administered 2019-02-04: 100 ug via INTRAVENOUS

## 2019-02-04 MED ORDER — ACETAMINOPHEN 500 MG PO TABS
1000.0000 mg | ORAL_TABLET | ORAL | Status: AC
  Administered 2019-02-04: 1000 mg via ORAL

## 2019-02-04 MED ORDER — DEXTROSE 5 % IV SOLN
3.0000 g | INTRAVENOUS | Status: AC
  Administered 2019-02-04: 3000 mg via INTRAVENOUS
  Filled 2019-02-04: qty 3

## 2019-02-04 MED ORDER — BUPIVACAINE HCL (PF) 0.5 % IJ SOLN
INTRAMUSCULAR | Status: AC
  Filled 2019-02-04: qty 30

## 2019-02-04 MED ORDER — GABAPENTIN 300 MG PO CAPS
ORAL_CAPSULE | ORAL | Status: AC
  Filled 2019-02-04: qty 1

## 2019-02-04 MED ORDER — FLUCONAZOLE 100 MG PO TABS: 100.0000 mg | ORAL_TABLET | Freq: Once | ORAL | 0 refills | Status: AC

## 2019-02-04 MED ORDER — KETAMINE HCL 10 MG/ML IJ SOLN
INTRAMUSCULAR | Status: DC | PRN
  Administered 2019-02-04: 150 mg via INTRAVENOUS

## 2019-02-04 MED ORDER — FAMOTIDINE 20 MG PO TABS
20.0000 mg | ORAL_TABLET | Freq: Once | ORAL | Status: AC
  Administered 2019-02-04: 20 mg via ORAL

## 2019-02-04 MED ORDER — LACTATED RINGERS IV SOLN
INTRAVENOUS | Status: DC
  Administered 2019-02-04: 07:00:00 via INTRAVENOUS

## 2019-02-04 SURGICAL SUPPLY — 90 items
"PENCIL ELECTRO HAND CTR " (MISCELLANEOUS) ×1 IMPLANT
APPLIER CLIP 5 13 M/L LIGAMAX5 (MISCELLANEOUS) ×2
BAG URINE DRAINAGE (UROLOGICAL SUPPLIES) ×2 IMPLANT
BLADE SURG SZ11 CARB STEEL (BLADE) ×3 IMPLANT
CANISTER SUCT 1200ML W/VALVE (MISCELLANEOUS) ×4 IMPLANT
CATH FOLEY 2WAY  5CC 16FR (CATHETERS) ×1
CATH URTH 16FR FL 2W BLN LF (CATHETERS) ×1 IMPLANT
CHLORAPREP W/TINT 26ML (MISCELLANEOUS) ×3 IMPLANT
CLIP APPLIE 5 13 M/L LIGAMAX5 (MISCELLANEOUS) ×1 IMPLANT
COVER MAYO STAND STRL (DRAPES) ×2 IMPLANT
COVER WAND RF STERILE (DRAPES) ×3 IMPLANT
DECANTER SPIKE VIAL GLASS SM (MISCELLANEOUS) ×2 IMPLANT
DEFOGGER SCOPE WARMER CLEARIFY (MISCELLANEOUS) ×3 IMPLANT
DERMABOND ADVANCED (GAUZE/BANDAGES/DRESSINGS) ×1
DERMABOND ADVANCED .7 DNX12 (GAUZE/BANDAGES/DRESSINGS) ×2 IMPLANT
DEVICE SUTURE ENDOST 10MM (ENDOMECHANICALS) ×1 IMPLANT
DRAPE CAMERA CLOSED 9X96 (DRAPES) ×2 IMPLANT
DRSG TEGADERM 2-3/8X2-3/4 SM (GAUZE/BANDAGES/DRESSINGS) IMPLANT
ELECT CAUTERY BLADE TIP 2.5 (TIP) ×2
ELECT REM PT RETURN 9FT ADLT (ELECTROSURGICAL) ×2
ELECTRODE CAUTERY BLDE TIP 2.5 (TIP) ×1 IMPLANT
ELECTRODE REM PT RTRN 9FT ADLT (ELECTROSURGICAL) ×1 IMPLANT
EVICEL AIRLESS SPRAY ACCES (MISCELLANEOUS) ×1 IMPLANT
GLOVE BIO SURGEON STRL SZ 6.5 (GLOVE) ×2 IMPLANT
GLOVE BIO SURGEON STRL SZ8 (GLOVE) ×10 IMPLANT
GLOVE INDICATOR 7.0 STRL GRN (GLOVE) ×2 IMPLANT
GLOVE INDICATOR 8.0 STRL GRN (GLOVE) ×2 IMPLANT
GLOVE PROTEXIS LATEX SZ 7.5 (GLOVE) ×4 IMPLANT
GLOVE SURG LATEX 7.5 PF (GLOVE) IMPLANT
GOWN STRL REUS W/ TWL LRG LVL3 (GOWN DISPOSABLE) ×4 IMPLANT
GOWN STRL REUS W/ TWL XL LVL3 (GOWN DISPOSABLE) ×2 IMPLANT
GOWN STRL REUS W/TWL LRG LVL3 (GOWN DISPOSABLE) ×4
GOWN STRL REUS W/TWL XL LVL3 (GOWN DISPOSABLE) ×2
GRASPER SUT TROCAR 14GX15 (MISCELLANEOUS) ×2 IMPLANT
IRRIGATION STRYKERFLOW (MISCELLANEOUS) ×2 IMPLANT
IRRIGATOR STRYKERFLOW (MISCELLANEOUS) ×4
IV LACTATED RINGERS 1000ML (IV SOLUTION) ×4 IMPLANT
IV NS 1000ML (IV SOLUTION) ×1
IV NS 1000ML BAXH (IV SOLUTION) ×1 IMPLANT
KIT PINK PAD W/HEAD ARE REST (MISCELLANEOUS) ×2
KIT PINK PAD W/HEAD ARM REST (MISCELLANEOUS) ×1 IMPLANT
KIT TURNOVER CYSTO (KITS) ×2 IMPLANT
KIT TURNOVER KIT A (KITS) ×2 IMPLANT
LABEL OR SOLS (LABEL) ×4 IMPLANT
MANIPULATOR VCARE LG CRV RETR (MISCELLANEOUS) IMPLANT
MANIPULATOR VCARE SML CRV RETR (MISCELLANEOUS) ×1 IMPLANT
MANIPULATOR VCARE STD CRV RETR (MISCELLANEOUS) IMPLANT
NEEDLE HYPO 22GX1.5 SAFETY (NEEDLE) ×2 IMPLANT
NEEDLE VERESS 14GA 120MM (NEEDLE) ×2 IMPLANT
NS IRRIG 500ML POUR BTL (IV SOLUTION) ×3 IMPLANT
OCCLUDER COLPOPNEUMO (BALLOONS) ×2 IMPLANT
PACK GYN LAPAROSCOPIC (MISCELLANEOUS) ×2 IMPLANT
PACK LAP CHOLECYSTECTOMY (MISCELLANEOUS) ×2 IMPLANT
PAD OB MATERNITY 4.3X12.25 (PERSONAL CARE ITEMS) ×2 IMPLANT
PAD PREP 24X41 OB/GYN DISP (PERSONAL CARE ITEMS) ×2 IMPLANT
PENCIL ELECTRO HAND CTR (MISCELLANEOUS) ×2 IMPLANT
PORT ACCESS TROCAR AIRSEAL 12 (TROCAR) ×1 IMPLANT
PORT ACCESS TROCAR AIRSEAL 5M (TROCAR) ×1
POUCH SPECIMEN RETRIEVAL 10MM (ENDOMECHANICALS) ×2 IMPLANT
SCISSORS METZENBAUM CVD 33 (INSTRUMENTS) ×2 IMPLANT
SET BI-LUMEN FLTR TB AIRSEAL (TUBING) ×1 IMPLANT
SET CYSTO W/LG BORE CLAMP LF (SET/KITS/TRAYS/PACK) ×2 IMPLANT
SET TRI-LUMEN FLTR TB AIRSEAL (TUBING) ×2 IMPLANT
SET TUBE SMOKE EVAC HIGH FLOW (TUBING) ×2 IMPLANT
SHEARS HARMONIC ACE PLUS 36CM (ENDOMECHANICALS) ×2 IMPLANT
SLEEVE ADV FIXATION 5X100MM (TROCAR) ×6 IMPLANT
SLEEVE ENDOPATH XCEL 5M (ENDOMECHANICALS) ×2 IMPLANT
SOLUTION ELECTROLUBE (MISCELLANEOUS) ×2 IMPLANT
SPONGE GAUZE 2X2 8PLY STRL LF (GAUZE/BANDAGES/DRESSINGS) IMPLANT
STRIP CLOSURE SKIN 1/2X4 (GAUZE/BANDAGES/DRESSINGS) ×2 IMPLANT
SUT ENDO VLOC 180-0-8IN (SUTURE) ×1 IMPLANT
SUT MNCRL 4-0 (SUTURE) ×2
SUT MNCRL 4-0 27XMFL (SUTURE) ×2
SUT VIC AB 0 CT1 36 (SUTURE) ×1 IMPLANT
SUT VIC AB 0 CT2 27 (SUTURE) ×1 IMPLANT
SUT VIC AB 3-0 SH 27 (SUTURE)
SUT VIC AB 3-0 SH 27X BRD (SUTURE) ×1 IMPLANT
SUT VIC AB 4-0 FS2 27 (SUTURE) ×1 IMPLANT
SUT VIC AB 4-0 SH 27 (SUTURE) ×1
SUT VIC AB 4-0 SH 27XANBCTRL (SUTURE) IMPLANT
SUT VICRYL 0 AB UR-6 (SUTURE) ×2 IMPLANT
SUTURE MNCRL 4-0 27XMF (SUTURE) ×1 IMPLANT
SYR 10ML LL (SYRINGE) ×3 IMPLANT
SYR 50ML LL SCALE MARK (SYRINGE) ×2 IMPLANT
SYSTEM WECK SHIELD CLOSURE (TROCAR) ×1 IMPLANT
TROCAR ADV FIXATION 12X100MM (TROCAR) ×3 IMPLANT
TROCAR XCEL 12X100 BLDLESS (ENDOMECHANICALS) ×2 IMPLANT
TROCAR XCEL NON-BLD 5MMX100MML (ENDOMECHANICALS) ×3 IMPLANT
TROCAR Z-THREAD OPTICAL 5X100M (TROCAR) ×2 IMPLANT
WATER STERILE IRR 1000ML POUR (IV SOLUTION) ×2 IMPLANT

## 2019-02-04 NOTE — Transfer of Care (Addendum)
Immediate Anesthesia Transfer of Care Note  Patient: Debbie Townsend  Procedure(s) Performed: LAPAROSCOPIC CHOLECYSTECTOMY (N/A ) TOTAL LAPAROSCOPIC HYSTERECTOMY WITH BILATERAL SALPINGECTOMY (N/A )  Patient Location: PACU  Anesthesia Type:General  Level of Consciousness: sedated  Airway & Oxygen Therapy: Patient Spontanous Breathing and Patient connected to face mask oxygen  Post-op Assessment: Report given to RN and Post -op Vital signs reviewed and stable  Post vital signs: Reviewed and stable  Last Vitals:  Vitals Value Taken Time  BP 146/100 02/04/2019 11:56 AM  Temp    Pulse 80 02/04/2019 11:57 AM  Resp 8 02/04/2019 11:57 AM  SpO2 93 % 02/04/2019 11:57 AM  Vitals shown include unvalidated device data.  Last Pain:  Vitals:   02/04/19 1141  TempSrc:   PainSc: 0-No pain         Complications: No apparent anesthesia complications

## 2019-02-04 NOTE — OR Nursing (Signed)
Patient rates pain a 4 with movement and 0 while sitting, patient refused any pain meds at this time and desires discharge.  Encouraged patient to take pain meds as need for pain, husband and patient verbalized understanding.

## 2019-02-04 NOTE — Op Note (Signed)
Operative Report:  PRE-OP DIAGNOSIS: MENORRHAGIA, ADENOMYOSIS   POST-OP DIAGNOSIS: MENORRHAGIA, ADENOMYOSIS   PROCEDURE: Procedure(s): TOTAL LAPAROSCOPIC HYSTERECTOMY WITH BILATERAL SALPINGECTOMY CYSTOSCOPY  SURGEON: Barnett Applebaum, MD, FACOG  ASSISTANT: Dr Georgianne Fick, No other capable assistant available, in surgery requiring high level assistant.  ANESTHESIA: General endotracheal anesthesia  ESTIMATED BLOOD LOSS: 50 mL  SPECIMENS: Uterus, Tubes.  COMPLICATIONS: None  DISPOSITION: stable to PACU  FINDINGS: Intraabdominal adhesions were noted. Left ovarian cyst.  PROCEDURE:  The patient was taken to the OR where anesthesia was administed. She has a laparoscopic cholecystectomy performed by Dr Celine Ahr of general surgery first.  At the conclusion of her surgery, she left the umbilical and RUQ ports in place, and had closed the other ports.  Pt was then placed in dorsal lithotomy position maintaining sterility on the main field, and she was prepped and draped below to include the vagina in the normal sterile fashion in the dorsal lithotomy position in the [place] stirrups. A time out was performed.  Foley catheter placed. A Graves speculum was inserted, the cervix was grasped with a single tooth tenaculum and the endometrial cavity was sounded. The cervix was progressively dilated to a size 18 [place] with [employer] dilators. A V-Care uterine manipulator was inserted in the usual fashion without incident. Gloves were changed and attention was turned to the abdomen.   A diagnostic laparoscopy was performed yielding the previously described findings utilizing the umbilical port already created by general surgery earlier.  Attention was turned to the left lower quadrant where after visualization of the inferior epigastric vessels a 34mm skin incision was made with the scalpel. A 5 mm laparoscopic port was inserted. The same procedure was repeated in the right lower quadrant with a 5 mm trocar. Attention  was turned to the left aspect of the uterus, where after visualization of the ureter, the round ligament was coagulated and transected using the 64mm Harmonic Scapel. The anterior and posterior leafs of the broad ligament were dissected off as the anterior one was coagulated and transected in a caudal direction towards the cuff of the uterine manipulator.  Attention was then turned to the left fallopian tube which was recognized by visualization of the fimbria. The tube is excised to its attachment to the uterus. The uterine-ovarian ligament and its blood vessels were carefully coagulated and transected using the Harmonic scapel.  Attention was turned to the right aspect of the uterus where the same procedure was performed.  The vesicouterine reflection of the peritoneum was dissected with the harmonic scapel and the bladder flap was created bluntly.  The uterine vessels were coagulated and transected bilaterally using first bipolar cautery and then the harmonic scapel. A 360 degree, circumferential colpotomy was done to completely amputate the uterus with cervix and tubes. Once the specimen was amputated it was delivered through the vagina.   The colpotomy was repaired in a simple running fashion using a delayed absorbable suture with an endo-stitch device.  Vaginal exam confirms complete closure.  The cavity was copiously irrigated. A survey of the pelvic cavity revealed adequate hemostasis and no injury to bowel, bladder, or ureter.   A diagnostic cystoscopy was performed using saline distension of bladder with no lesions or injuries noted.  Bilateral urine flow from each ureteral orifice is visualized.  At this point the procedure was finalized. Umbilical port site fascia incision is closed with a vicryl suture using the fascia closure device. All the instruments were removed from the patient's body. Gas was expelled and  patient is leveled.  Incisions are closed with skin adhesive and 4-0 vicryl suture.     Patient goes to recovery room in stable condition.  All sponge, instrument, and needle counts are correct x2.     Barnett Applebaum, MD, [facility], [facility] 02/04/2019  11:22 AM

## 2019-02-04 NOTE — Anesthesia Preprocedure Evaluation (Signed)
Anesthesia Evaluation  Patient identified by MRN, date of birth, ID band Patient awake    Reviewed: Allergy & Precautions, H&P , NPO status , Patient's Chart, lab work & pertinent test results  History of Anesthesia Complications (+) Family history of anesthesia reaction and history of anesthetic complications  Airway Mallampati: III  TM Distance: <3 FB Neck ROM: full    Dental  (+) Chipped   Pulmonary neg shortness of breath, Current Smoker,           Cardiovascular Exercise Tolerance: Good hypertension, (-) angina(-) Past MI and (-) DOE      Neuro/Psych  Headaches, PSYCHIATRIC DISORDERS    GI/Hepatic Neg liver ROS, GERD  Medicated and Controlled,  Endo/Other  negative endocrine ROS  Renal/GU      Musculoskeletal   Abdominal   Peds  Hematology negative hematology ROS (+)   Anesthesia Other Findings Past Medical History: No date: Anxiety No date: BRCA negative No date: Depression No date: Family history of anesthesia complication     Comment:   mother had Post -op nausea and dizziness. No date: Foot drop, left foot No date: GERD (gastroesophageal reflux disease) No date: Headache No date: History of kidney stones No date: Hypertension 2007 or 2009: Pseudotumor cerebri  Past Surgical History: No date: CESAREAN SECTION     Comment:  2009/2017 09/22/2014: LUMBAR LAMINECTOMY/DECOMPRESSION MICRODISCECTOMY; Left     Comment:  Procedure: Left Lumbar four-five microdiskectomy;                Surgeon: Consuella Lose, MD;  Location: [facility];               Service: Neurosurgery;  Laterality: Left;  Left Lumbar               four-five microdiskectomy No date: WISDOM TOOTH EXTRACTION  BMI    Body Mass Index:  43.66 kg/m      Reproductive/Obstetrics negative OB ROS                             Anesthesia Physical Anesthesia Plan  ASA: III  Anesthesia Plan: General ETT    Post-op Pain Management:    Induction: Intravenous  PONV Risk Score and Plan: Ondansetron, Dexamethasone, Midazolam and Treatment may vary due to age or medical condition  Airway Management Planned: Oral ETT  Additional Equipment:   Intra-op Plan:   Post-operative Plan: Extubation in OR  Informed Consent: I have reviewed the patients History and Physical, chart, labs and discussed the procedure including the risks, benefits and alternatives for the proposed anesthesia with the patient or authorized representative who has indicated his/her understanding and acceptance.     Dental Advisory Given  Plan Discussed with: Anesthesiologist, CRNA and Surgeon  Anesthesia Plan Comments: (Patient consented for risks of anesthesia including but not limited to:  - adverse reactions to medications - damage to teeth, lips or other oral mucosa - sore throat or hoarseness - Damage to heart, brain, lungs or loss of life  Patient voiced understanding.)        Anesthesia Quick Evaluation

## 2019-02-04 NOTE — Progress Notes (Signed)
Dr. Amie Critchley notified of pt continued nausea. Acknowledged. Orders received.

## 2019-02-04 NOTE — Op Note (Signed)
Laparoscopic Cholecystectomy  Pre-operative Diagnosis: symptomatic cholelithiasis  Post-operative Diagnosis: same  Procedure: laparoscopic cholecystectomy  Surgeon: Fredirick Maudlin, MD  Anesthesia: GETA  Assistant:none  Findings: Due to the patient's marketed morbid obesity, it was very difficult to visualize our structures and maintain pneumoperitoneum.  Her omentum was very heavy and floppy, obscuring our view of the cystic duct and artery.  Once we were able to obtain adequate visualization the operation proceeded without difficulty.   Estimated Blood Loss: 10 cc         Drains: none         Specimens: Gallbladder           Complications: none   Procedure Details  The patient was seen again in the preoperative holding area. The benefits, complications, treatment options, and expected outcomes were discussed with the patient. The risks of bleeding, infection, recurrence of symptoms, failure to resolve symptoms, bile duct damage, bile duct leak, retained common bile duct stone, bowel injury, any of which could require further surgery and/or ERCP, stent, or papillotomy were reviewed with the patient. The likelihood of improving the patient's symptoms with return to their baseline status is good.  The patient and/or family concurred with the proposed plan, giving informed consent.  The patient was taken to operating room, identified as Debbie Townsend and the procedure verified as Laparoscopic Cholecystectomy. A time out was performed and the above information confirmed.  Prior to the induction of general anesthesia, antibiotic prophylaxis was administered. VTE prophylaxis was in place. General endotracheal anesthesia was then administered and tolerated well. After the induction, the abdomen was prepped with Chloraprep and draped in the sterile fashion. The patient was positioned in the supine position.  Due to her obesity, we utilized the bariatric footboard in anticipation of needing  very steep reverse Trendelenburg to visualize the anatomic structures.  Optiview technique was utilized to enter the abdomen in the midclavicular line just below the costal margin.  We obtained good pneumoperitoneum and placed a periumbilical camera port and 2 additional working ports.  Pneumoperitoneum was then created with CO2 and tolerated well without any adverse changes in the patient's vital signs. All skin incisions  were infiltrated with a local anesthetic agent before making the incision and placing the trocars.   The patient was positioned  in steep reverse Trendelenburg, and tilted to the patient's left.  The gallbladder was identified, the fundus grasped and retracted cephalad. Adhesions were lysed bluntly. The infundibulum was grasped and retracted laterally, exposing the peritoneum overlying the triangle of Calot. This was then divided and exposed in a blunt fashion. An extended critical view of the cystic duct and cystic artery was obtained.  The cystic duct was clearly identified and bluntly dissected free. Both the cystic artery and duct were double clipped and divided.  The gallbladder was taken from the gallbladder fossa in a retrograde fashion with the electrocautery. The gallbladder was removed and placed in an Endopouch bag. The liver bed was irrigated and inspected. Hemostasis was achieved with the electrocautery. Copious saline irrigation was utilized and was repeatedly aspirated until clear.  The gallbladder and Endocatch sac were then removed through a port site.   Evicel was applied to the liver bed for additional hemostasis.  Inspection of the right upper quadrant was performed. No bleeding, bile duct injury or leak, or bowel injury was noted.  As this was a combined case with gynecology, I removed the subxiphoid and mid axillary line ports as they would not be necessary  for their part of the procedure.  Dr. Kenton Kingfisher requested that I leave the periumbilical and the more lateral of  the right upper quadrant ports in place for his procedure.  I closed the two 5 mm port sites with running subcuticular Monocryl and turned the case over to Dr. Kenton Kingfisher for his portion of the operation.  This will be dictated separately.Fredirick Maudlin, MD, FACS

## 2019-02-04 NOTE — Progress Notes (Signed)
Ch met with pt and family pre-op to provide compassionate presence. Pt shared that she is having a hysterectomy and gall bladder surgery today. Pt had a positive affect and seem to hv family support. Ch led prayer w/ pt and family petitioning for total restoration and healing. F/u recommended post-op.       02/04/19 0700  Clinical Encounter Type  Visited With Patient and family together  Visit Type Psychological support;Spiritual support;Social support;Pre-op  Spiritual Encounters  Spiritual Needs Prayer;Emotional;Grief support  Stress Factors  Patient Stress Factors Health changes  Family Stress Factors None identified

## 2019-02-04 NOTE — Discharge Instructions (Signed)
Total Laparoscopic Hysterectomy, Care After °This sheet gives you information about how to care for yourself after your procedure. Your health care provider may also give you more specific instructions. If you have problems or questions, contact your health care provider. °What can I expect after the procedure? °After the procedure, it is common to have: °· Pain and bruising around your incisions. °· A sore throat, if a breathing tube was used during surgery. °· Fatigue. °· Poor appetite. °· Less interest in sex. °If your ovaries were also removed, it is also common to have symptoms of menopause such as hot flashes, night sweats, and lack of sleep (insomnia). °Follow these instructions at home: °Bathing °· Do not take baths, swim, or use a hot tub until your health care provider approves. You may need to only take showers for 2-3 weeks. °· Keep your bandage (dressing) dry until your health care provider says it can be removed. °Incision care ° °· Follow instructions from your health care provider about how to take care of your incisions. Make sure you: °? Wash your hands with soap and water before you change your dressing. If soap and water are not available, use hand sanitizer. °? Change your dressing as told by your health care provider. °? Leave stitches (sutures), skin glue, or adhesive strips in place. These skin closures may need to stay in place for 2 weeks or longer. If adhesive strip edges start to loosen and curl up, you may trim the loose edges. Do not remove adhesive strips completely unless your health care provider tells you to do that. °· Check your incision area every day for signs of infection. Check for: °? Redness, swelling, or pain. °? Fluid or blood. °? Warmth. °? Pus or a bad smell. °Activity °· Get plenty of rest and sleep. °· Do not lift anything that is heavier than 10 lbs (4.5 kg) for one month after surgery, or as long as told by your health care provider. °· Do not drive or use heavy  machinery while taking prescription pain medicine. °· Do not drive for 24 hours if you were given a medicine to help you relax (sedative). °· Return to your normal activities as told by your health care provider. Ask your health care provider what activities are safe for you. °Lifestyle ° °· Do not use any products that contain nicotine or tobacco, such as cigarettes and e-cigarettes. These can delay healing. If you need help quitting, ask your health care provider. °· Do not drink alcohol until your health care provider approves. °General instructions °· Do not douche, use tampons, or have sex for at least 6 weeks, or as told by your health care provider. °· Take over-the-counter and prescription medicines only as told by your health care provider. °· To monitor yourself for a fever, take your temperature at least once a day during recovery. °· If you struggle with physical or emotional changes after your procedure, speak with your health care provider or a therapist. °· To prevent or treat constipation while you are taking prescription pain medicine, your health care provider may recommend that you: °? Drink enough fluid to keep your urine clear or pale yellow. °? Take over-the-counter or prescription medicines. °? Eat foods that are high in fiber, such as fresh fruits and vegetables, whole grains, and beans. °? Limit foods that are high in fat and processed sugars, such as fried and sweet foods. °· Keep all follow-up visits as told by your health care provider.   This is important. °Contact a health care provider if: °· You have chills or a fever. °· You have redness, swelling, or pain around an incision. °· You have fluid or blood coming from an incision. °· Your incision feels warm to the touch. °· You have pus or a bad smell coming from an incision. °· An incision breaks open. °· You feel dizzy or light-headed. °· You have pain or bleeding when you urinate. °· You have diarrhea, nausea, or vomiting that does not  go away. °· You have abnormal vaginal discharge. °· You have a rash. °· You have pain that does not get better with medicine. °Get help right away if: °· You have a fever and your symptoms suddenly get worse. °· You have severe abdominal pain. °· You have chest pain. °· You have shortness of breath. °· You faint. °· You have pain, swelling, or redness on your leg. °· You have heavy vaginal bleeding with blood clots. °Summary °· After the procedure it is common to have abdominal pain. Your provider will give you medication for this. °· Do not take baths, swim, or use a hot tub until your health care provider approves. °· Do not lift anything that is heavier than 10 lbs (4.5 kg) for one month after surgery, or as long as told by your health care provider. °· Notify your provider if you have any signs or symptoms of infection after the procedure. °This information is not intended to replace advice given to you by your health care provider. Make sure you discuss any questions you have with your health care provider. °Document Released: 09/22/2013 Document Revised: 02/12/2017 Document Reviewed: 02/12/2017 °Elsevier Interactive Patient Education © 2019 [employer] ° °AMBULATORY SURGERY  °DISCHARGE INSTRUCTIONS ° ° °1) The drugs that you were given will stay in your system until tomorrow so for the next 24 hours you should not: ° °A) Drive an automobile °B) Make any legal decisions °C) Drink any alcoholic beverage ° ° °2) You may resume regular meals tomorrow.  Today it is better to start with liquids and gradually work up to solid foods. ° °You may eat anything you prefer, but it is better to start with liquids, then soup and crackers, and gradually work up to solid foods. ° ° °3) Please notify your doctor immediately if you have any unusual bleeding, trouble breathing, redness and pain at the surgery site, drainage, fever, or pain not relieved by medication. ° ° ° °4) Additional Instructions: ° ° ° ° ° ° ° °Please  contact your physician with any problems or Same Day Surgery at [phone], Monday through Friday 6 am to 4 pm, or [facility] [place] [facility] number at [phone]. °

## 2019-02-04 NOTE — Anesthesia Postprocedure Evaluation (Signed)
Anesthesia Post Note  Patient: Debbie Townsend  Procedure(s) Performed: LAPAROSCOPIC CHOLECYSTECTOMY (N/A ) TOTAL LAPAROSCOPIC HYSTERECTOMY WITH BILATERAL SALPINGECTOMY (N/A )  Patient location during evaluation: PACU Anesthesia Type: General Level of consciousness: awake and alert Pain management: pain level controlled Vital Signs Assessment: post-procedure vital signs reviewed and stable Respiratory status: spontaneous breathing, nonlabored ventilation, respiratory function stable and patient connected to nasal cannula oxygen Cardiovascular status: blood pressure returned to baseline and stable Postop Assessment: no apparent nausea or vomiting Anesthetic complications: no     Last Vitals:  Vitals:   02/04/19 1311 02/04/19 1334  BP: 139/89 138/88  Pulse: 84 73  Resp: 20 20  Temp: (!) 36.3 C (!) 36.3 C  SpO2: 94% 99%    Last Pain:  Vitals:   02/04/19 1334  TempSrc: Temporal  PainSc: 5                  Precious Haws Piscitello

## 2019-02-04 NOTE — Anesthesia Post-op Follow-up Note (Signed)
Anesthesia QCDR form completed.        

## 2019-02-04 NOTE — Interval H&P Note (Signed)
History and Physical Interval Note:  02/04/2019 7:25 AM  Debbie Townsend  has presented today for surgery, with the diagnosis of  MENORRHAGIA, ADENOMYOSIS  The various methods of treatment have been discussed with the patient and family. After consideration of risks, benefits and other options for treatment, the patient has consented to  Procedure(s): TOTAL LAPAROSCOPIC HYSTERECTOMY WITH BILATERAL SALPINGECTOMY (N/A) as a surgical intervention .  The patient's history has been reviewed, patient examined, no change in status, stable for surgery.  I have reviewed the patient's chart and labs.  Questions were answered to the patient's satisfaction.     Hoyt Koch

## 2019-02-04 NOTE — Anesthesia Procedure Notes (Signed)
Procedure Name: Intubation Date/Time: 02/04/2019 7:41 AM Performed by: Justus Memory, CRNA Pre-anesthesia Checklist: Patient identified, Patient being monitored, Timeout performed, Emergency Drugs available and Suction available Patient Re-evaluated:Patient Re-evaluated prior to induction Oxygen Delivery Method: Circle system utilized Preoxygenation: Pre-oxygenation with 100% oxygen Induction Type: IV induction Ventilation: Mask ventilation without difficulty Laryngoscope Size: 3 and McGraph Grade View: Grade I Tube type: Oral Tube size: 7.0 mm Number of attempts: 1 Airway Equipment and Method: Stylet,  Rigid stylet,  Patient positioned with wedge pillow and Video-laryngoscopy Placement Confirmation: ETT inserted through vocal cords under direct vision,  positive ETCO2,  breath sounds checked- equal and bilateral and CO2 detector Secured at: 21 cm Tube secured with: Tape Dental Injury: Teeth and Oropharynx as per pre-operative assessment  Difficulty Due To: Difficulty was anticipated and Difficult Airway- due to large tongue Future Recommendations: Recommend- induction with short-acting agent, and alternative techniques readily available

## 2019-02-04 NOTE — H&P (Signed)
Debbie Townsend is a 33 y.o. female.  She was seen by me about a month ago to discuss right upper quadrant abdominal pain.  She states that she initially had pain in the right upper quadrant with her pregnancy approximately 3 years ago.  At that time, she was diagnosed with gallbladder disease and was supposed to have surgery after the birth of her daughter.  Unfortunately, her daughter was born premature and the subsequent care of her child took precedence over surgery.  She states that she continues to have intermittent flareups.  The pain is not constant but when it occurs it is severe and in the right upper quadrant.  It does not seem to radiate anywhere.  She reports having done some online research and tried to identify foods that should be "safe" for people with gallbladder disease.  She says that this does not really help and that any food can trigger this.  She says that her attacks are sometimes accompanied by nausea emesis of yellow bile and undigested food, as well as diarrhea.  She says that the only thing that sometimes helps, is a rice heating bag applied with some pressure to the right upper quadrant.  She says this does not completely relieve the pain but lessens it.  She denies ever having had jaundice, pancreatitis, acholic stools, or tea colored urine.  She had a right upper quadrant ultrasound in March 2019 that demonstrated a gallbladder with numerous gallstones.  No evidence of cholecystitis.       Past Medical History:  Diagnosis Date  . Anxiety   . BRCA negative   . Depression   . Family history of anesthesia complication     mother had Post -op nausea and dizziness.  . Foot drop, left foot   . GERD (gastroesophageal reflux disease)   . Headache   . Hypertension   . Kidney stones   . Pseudotumor cerebri 2007 or 2009         Past Surgical History:  Procedure Laterality Date  . CESAREAN SECTION     2009/2017  . LUMBAR LAMINECTOMY/DECOMPRESSION  MICRODISCECTOMY Left 09/22/2014   Procedure: Left Lumbar four-five microdiskectomy;  Surgeon: Neelesh Nundkumar, MD;  Location: [facility];  Service: Neurosurgery;  Laterality: Left;  Left Lumbar four-five microdiskectomy         Family History  Problem Relation Age of Onset  . Cancer Mother        breast dx'ed age 48/49  . Hypertension Mother   . Diabetes Mother   . Hypothyroidism Mother   . Depression Mother   . Breast cancer Mother        60s  . Liver disease Father   . Heart attack Father   . Arthritis Father   . Alcohol abuse Father   . Depression Father   . Drug abuse Father   . Early death Father   . Heart disease Father   . Hypertension Father   . Depression Sister   . Hypertension Sister   . Arthritis Sister   . Hypertension Sister   . Cancer Maternal Grandmother        stomach>liver>brain met  . Cancer Maternal Uncle        lung not a smoker     Social History Social History        Tobacco Use  . Smoking status: Current Every Day Smoker    Types: Cigarettes  . Smokeless tobacco: Never Used  Substance Use Topics  .   Alcohol use: No    Comment: " occasionally"  . Drug use: No         Allergies  Allergen Reactions  . Amoxicillin Hives, Itching, Swelling and Dermatitis    Hives   . Other Itching and Swelling    Almonds-mouth swells and itches  . Apple Other (See Comments)    Lips itch and swell, inside of mouth gets hives          Current Outpatient Medications  Medication Sig Dispense Refill  . ALPRAZolam (XANAX) 0.25 MG tablet Take 1 tablet (0.25 mg total) by mouth daily as needed for anxiety. (Patient not taking: Reported on 09/10/2018) 30 tablet 0  . lisinopril (PRINIVIL,ZESTRIL) 20 MG tablet Take 1 tablet (20 mg total) by mouth daily. In am 90 tablet 3  . sertraline (ZOLOFT) 50 MG tablet Take 1.5 tablets (75 mg total) by mouth daily. 140 tablet 1   No current facility-administered medications  for this visit.     Review of Systems Review of Systems  Constitutional: Positive for unexpected weight change.  HENT: Negative.   Eyes: Negative.   Respiratory: Negative.   Cardiovascular: Negative.   Gastrointestinal: Positive for abdominal distention, abdominal pain, diarrhea, nausea and vomiting.  Endocrine: Negative.   Genitourinary: Negative.   Musculoskeletal: Negative.   Skin: Negative.   Neurological: Negative.   Hematological: Negative.   Psychiatric/Behavioral: Negative.     Vitals:   02/04/19 0618  BP: (!) 137/95  Pulse: 87  Resp: 14  Temp: 98.8 F (37.1 C)  SpO2: 99%     Physical Exam Physical Exam Constitutional:      General: She is not in acute distress.    Appearance: She is obese.  HENT:     Head: Normocephalic and atraumatic.     Mouth/Throat:     Mouth: Mucous membranes are moist.     Pharynx: Oropharynx is clear.  Eyes:     General: No scleral icterus.    Extraocular Movements: Extraocular movements intact.  Cardiovascular:     Rate and Rhythm: Normal rate and regular rhythm.  Pulmonary:     Effort: Pulmonary effort is normal.     Breath sounds: Normal breath sounds.  Abdominal:     General: Abdomen is protuberant. Bowel sounds are normal.     Palpations: Abdomen is soft. There is no mass.     Tenderness: There is abdominal tenderness in the right upper quadrant. There is no guarding. Negative signs include Murphy's sign.     Hernia: No hernia is present.     Comments: Exam difficult secondary to body habitus.  Genitourinary:    Comments: deferred Skin:    General: Skin is warm and dry.  Neurological:     General: No focal deficit present.     Mental Status: She is alert.  Psychiatric:        Mood and Affect: Mood normal.        Behavior: Behavior normal.     Data Reviewed I personally reviewed the right upper quadrant ultrasound performed in March 2019.  Stones are present within the gallbladder. CLINICAL DATA: Right  upper quadrant pain for 1 year  EXAM: ABDOMEN ULTRASOUND COMPLETE  COMPARISON: 11/28/2014 right upper quadrant ultrasound  FINDINGS: Gallbladder: Wall echo shadow appearance consistent with numerous calculi. Most of the gallbladder wall is not visible due to the degree of shadowing. There is no sonographic Murphy sign.  Common bile duct: Diameter: 4 mm. Where visualized, no filling defect.  Liver:   Hepatic steatosis with diffuse increased echogenicity and diminished acoustic penetration. Portal vein is patent on color Doppler imaging with normal direction of blood flow towards the liver.  IVC: No abnormality visualized.  Pancreas: Visualized portion unremarkable.  Spleen: Size and appearance within normal limits.  Right Kidney: Length: 12.5 cm. Echogenicity within normal limits. No mass or hydronephrosis visualized.  Left Kidney: Length: 11.8 cm. Echogenicity within normal limits. No mass or hydronephrosis visualized.  Abdominal aorta: No aneurysm visualized.  IMPRESSION: 1. Gallstones fill the gallbladder and obscure most of the gallbladder wall. 2. Hepatic steatosis.  Results for Zerby, Jennika S (MRN [id number]) as of 01/04/2019 15:39  Ref. Range 02/23/2018 08:42  Sodium Latest Ref Range: 134 - 144 mmol/L 141  Potassium Latest Ref Range: 3.5 - 5.2 mmol/L 4.9  Chloride Latest Ref Range: 96 - 106 mmol/L 104  Glucose Latest Ref Range: 65 - 99 mg/dL 82  BUN Latest Ref Range: 6 - 20 mg/dL 14  Creatinine Latest Ref Range: 0.57 - 1.00 mg/dL 0.79  Calcium Latest Ref Range: 8.7 - 10.2 mg/dL 8.8  BUN/Creatinine Ratio Latest Ref Range: 9 - 23  18  Phosphorus Latest Ref Range: 2.5 - 4.5 mg/dL 3.9  Alkaline Phosphatase Latest Ref Range: 39 - 117 IU/L 44  Albumin Latest Ref Range: 3.5 - 5.5 g/dL 3.9  Albumin/Globulin Ratio Latest Ref Range: 1.2 - 2.2  1.4  Uric Acid Latest Ref Range: 2.5 - 7.1 mg/dL 5.0  AST Latest Ref Range: 0 - 40 IU/L 19  ALT Latest Ref  Range: 0 - 32 IU/L 23  Total Protein Latest Ref Range: 6.0 - 8.5 g/dL 6.7  Total Bilirubin Latest Ref Range: 0.0 - 1.2 mg/dL <0.2  GGT Latest Ref Range: 0 - 60 IU/L 18  GFR, Est Non African American Latest Ref Range: >59 mL/min/1.73 99  GFR, Est African American Latest Ref Range: >59 mL/min/1.73 115  Estimated CHD Risk Latest Ref Range: 0.0 - 1.0 times avg. 0.5  LDH Latest Ref Range: 119 - 226 IU/L 390 (H)  Total CHOL/HDL Ratio Latest Ref Range: 0.0 - 4.4 ratio 3.4  Cholesterol, Total Latest Ref Range: 100 - 199 mg/dL 126  HDL Cholesterol Latest Ref Range: >39 mg/dL 37 (L)  LDL (calc) Latest Ref Range: 0 - 99 mg/dL 76  Triglycerides Latest Ref Range: 0 - 149 mg/dL 67  VLDL Cholesterol Cal Latest Ref Range: 5 - 40 mg/dL 13  Iron Latest Ref Range: 27 - 159 ug/dL 40  The most recent labs available for review dating back to March 2019.  There is no evidence of biliary obstruction or liver dysfunction based upon these labs.  Assessment  Assessment    This is a 32-year-old woman with symptomatic cholelithiasis.  She desires surgical removal due to ongoing attacks of biliary colic.  I believe she would benefit from cholecystectomy.  Due to her morbid obesity, the operation will be more complicated.   Plan  Plan    I discussed the procedure in detail.  We discussed the risks and benefits of a laparoscopic cholecystectomy and possible cholangiogram including, but not limited to: bleeding, infection, injury to surrounding structures such as the intestine or liver, bile leak, retained gallstones, need to convert to an open procedure, prolonged diarrhea, blood clots such as DVT, common bile duct injury, anesthesia risks, and possible need for additional procedures. The patient had the opportunity to ask any questions and these were answered to her satisfaction.  She was specifically concerned about the possibility of   surgical site infection due to her body habitus.  I acknowledged that this was  certainly more of a risk as well as the possibility of skin level wound dehiscence secondary to tension on the incision sites due to her abdominal girth.  For my part of the operation, I will require bariatric table and leg support as well as having bariatric instruments available, again due to the patient's morbid obesity. Will proceed with combined operation with Dr. Harris today.    

## 2019-02-08 ENCOUNTER — Telehealth: Payer: Self-pay

## 2019-02-08 LAB — SURGICAL PATHOLOGY

## 2019-02-08 NOTE — Telephone Encounter (Signed)
Pt calling; had hyst/galbladder surg last Thurs. Has questions: When can she drive?  When can she take dressings off?  When can incision get wet in shower?  [phone]

## 2019-02-08 NOTE — Telephone Encounter (Signed)
Drive today Remove dressings today OK to get wet as skin glue protects incisions

## 2019-02-08 NOTE — Telephone Encounter (Signed)
Pt aware.

## 2019-02-09 ENCOUNTER — Encounter: Payer: Self-pay | Admitting: Obstetrics and Gynecology

## 2019-02-09 ENCOUNTER — Telehealth: Payer: Self-pay

## 2019-02-09 ENCOUNTER — Ambulatory Visit (INDEPENDENT_AMBULATORY_CARE_PROVIDER_SITE_OTHER): Payer: BLUE CROSS/BLUE SHIELD | Admitting: Obstetrics and Gynecology

## 2019-02-09 VITALS — BP 120/80 | Temp 98.7°F | Ht 67.0 in | Wt 271.0 lb

## 2019-02-09 DIAGNOSIS — I808 Phlebitis and thrombophlebitis of other sites: Secondary | ICD-10-CM

## 2019-02-09 DIAGNOSIS — R350 Frequency of micturition: Secondary | ICD-10-CM | POA: Diagnosis not present

## 2019-02-09 LAB — POCT URINALYSIS DIPSTICK
Bilirubin, UA: NEGATIVE
Glucose, UA: NEGATIVE
Ketones, UA: NEGATIVE
Leukocytes, UA: NEGATIVE
Nitrite, UA: NEGATIVE
Protein, UA: NEGATIVE
Spec Grav, UA: 1.02 (ref 1.010–1.025)
Urobilinogen, UA: 1 E.U./dL
pH, UA: 5 (ref 5.0–8.0)

## 2019-02-09 NOTE — Telephone Encounter (Signed)
Patient is schedule with SDJ today at 4:30

## 2019-02-09 NOTE — Telephone Encounter (Signed)
Pt called triage has questions about her hand where the IV was during surgery last week. After surgery they realized the vein had blown, she had a large Knot, having pain that goes down to the wrist, hand feels warm and painful, she can move her fingers but its tight. Please advise

## 2019-02-09 NOTE — Telephone Encounter (Signed)
Please call pt and schedule appointment, so she can ne on antibiotic per rph

## 2019-02-09 NOTE — Telephone Encounter (Signed)
Appt to be seen for that here or at Dr Stacy Gardner (gen surgery).  I cannot see her til Thurs, but needs to be seen today by one of the other doctors to make sure no infection there that would require ABX

## 2019-02-09 NOTE — Progress Notes (Signed)
   Postoperative Follow-up Patient presents post op from TLH/BS/cystoscoy 5 days ago for menorrhagia, adenomyosis. (had concurrent cholecystectomy)  Subjective: She presents as a work-in patient for left wrist pain where she states she had a "blown" IV site. She states the area used to have a large amount of swelling, which has gone down. She denies fevers, chills, nausea, vomiting. She is tolerating PO. She is having some diarrhea.  She mainly is concerned about the left hand and the fact that it is more difficult to bend her wrist and the tenderness in her hand.  Objective: Vitals:   02/09/19 1623  BP: 120/80  Temp: 98.7 F (37.1 C)   Vital Signs: BP 120/80   Temp 98.7 F (37.1 C)   Ht 5\' 7"  (1.702 m)   Wt 271 lb (122.9 kg)   LMP 01/12/2019   BMI 42.44 kg/m  Constitutional: Well nourished, well developed female in no acute distress.  HEENT: normal Skin: Warm and dry.  Extremity: LE - no edema. Left upper extremity with mildly increased edema as compared to the right. There is tenderness over the dorsal part of the hand, wrist, and very distal arm. There is a palpable cord that is superficial, but not overly firm. There is mild warm of the skin, but no erythema.  Abdomen: s, nt, nd, +BS All incisions clean, dry, and intact with surgical glue present. Mild erythema surrounding each. There is evidence of a reaction to the adhesive with the bandages post-operatively. She has band-aids from home that are removed and also demonstrate a mild skin reaction.  There is a bulla over her central abdomen that is about 1 cm in diameter.     Assessment: 33 y.o. s/p TLH/BS/Cyst/Cholecystectomy she appears to have a superficial thrombophlebitis.  Plan: Conservative therapy only for her superficial thrombophlebitis.  Encouraged NSAID use and warm and/or cold compresses.  If her pain spread proximally on her arm, she is to contact us immediately.  Recommend follow up in a couple days to ensure the  incisions do not appear worse and the thrombophlebitis is improving.   Prentice Docker, MD 02/09/2019, 4:50 PM

## 2019-02-11 ENCOUNTER — Telehealth: Payer: Self-pay

## 2019-02-11 NOTE — Telephone Encounter (Signed)
This can be normal pt will be okay to wait until tomorrow

## 2019-02-11 NOTE — Telephone Encounter (Signed)
Pt aware.

## 2019-02-11 NOTE — Telephone Encounter (Signed)
Pt had hyst last Thurs c [facility].  In the last 24hrs she has noticed a little bit of spotting.  Is this normal?  Has appt tomorrow.  [phone]

## 2019-02-12 ENCOUNTER — Encounter: Payer: Self-pay | Admitting: Obstetrics & Gynecology

## 2019-02-12 ENCOUNTER — Ambulatory Visit (INDEPENDENT_AMBULATORY_CARE_PROVIDER_SITE_OTHER): Payer: BLUE CROSS/BLUE SHIELD | Admitting: Obstetrics & Gynecology

## 2019-02-12 VITALS — BP 120/80 | Ht 67.0 in | Wt 263.0 lb

## 2019-02-12 DIAGNOSIS — Z90710 Acquired absence of both cervix and uterus: Secondary | ICD-10-CM | POA: Insufficient documentation

## 2019-02-12 HISTORY — DX: Acquired absence of both cervix and uterus: Z90.710

## 2019-02-12 NOTE — Progress Notes (Signed)
  Postoperative Follow-up Patient presents post op from [facility] for abnormal uterine bleeding, 1 week ago.  Subjective: Patient reports some improvement in her preop symptoms.  Improving superficial thrombophloebitis of left hand from earlier this week.  Some incisional soreness from 2 of the incisions. Eating a regular diet without difficulty. The patient is not having any pain.  Activity: sedentary. Patient reports additional symptom's since surgery of diarrhea and inability to eat much of anything.  She also had cholecystectomy last week at time of hysterectomy. Also reports some vag itching, concern for yeast infection.  Objective: BP 120/80   Ht 5\' 7"  (1.702 m)   Wt 263 lb (119.3 kg)   BMI 41.19 kg/m  Physical Exam Constitutional:      General: She is not in acute distress.    Appearance: She is well-developed.  Genitourinary:     Pelvic exam was performed with patient supine.     Vagina and rectum normal.     No vaginal erythema or bleeding.     No right or left adnexal mass present.     Right adnexa not tender.     Left adnexa not tender.     Genitourinary Comments: Cervix and uterus absent. Vaginal cuff healing well.  Cardiovascular:     Rate and Rhythm: Normal rate.  Pulmonary:     Effort: Pulmonary effort is normal.  Abdominal:     General: There is no distension.     Palpations: Abdomen is soft.     Tenderness: There is no abdominal tenderness.     Comments: Incision healing well.  Musculoskeletal: Normal range of motion.  Neurological:     Mental Status: She is alert and oriented to person, place, and time.     Cranial Nerves: No cranial nerve deficit.  Skin:    General: Skin is warm and dry.   Assessment: s/p :  total laparoscopic hysterectomy with bilateral salpingectomy progressing well  Plan: Patient has done well after surgery with no apparent complications.  I have discussed the post-operative course to date, and the expected progress moving forward.  The  patient understands what complications to be concerned about.  I will see the patient in routine follow up, or sooner if needed.    Activity plan: No heavy lifting.Marland Kitchen  Pelvic rest.  No s/sx yeast infection.  Phlebitis improving.  Monitor incisions, keep clean and dry.  No sex.  Hoyt Koch 02/12/2019, 4:15 PM

## 2019-02-15 ENCOUNTER — Other Ambulatory Visit
Admission: RE | Admit: 2019-02-15 | Discharge: 2019-02-15 | Disposition: A | Discharge status: Home / Self Care | Payer: BLUE CROSS/BLUE SHIELD | Source: Ambulatory Visit | Attending: General Surgery | Admitting: General Surgery

## 2019-02-15 ENCOUNTER — Other Ambulatory Visit: Payer: Self-pay

## 2019-02-15 ENCOUNTER — Encounter: Payer: Self-pay | Admitting: General Surgery

## 2019-02-15 ENCOUNTER — Ambulatory Visit (INDEPENDENT_AMBULATORY_CARE_PROVIDER_SITE_OTHER): Payer: BLUE CROSS/BLUE SHIELD | Admitting: General Surgery

## 2019-02-15 VITALS — BP 118/77 | HR 87 | Temp 98.1°F | Ht 68.0 in | Wt 267.0 lb

## 2019-02-15 DIAGNOSIS — Z9049 Acquired absence of other specified parts of digestive tract: Secondary | ICD-10-CM

## 2019-02-15 DIAGNOSIS — R197 Diarrhea, unspecified: Secondary | ICD-10-CM | POA: Diagnosis not present

## 2019-02-15 LAB — C DIFFICILE QUICK SCREEN W PCR REFLEX
C Diff antigen: NEGATIVE
C Diff interpretation: NOT DETECTED
C Diff toxin: NEGATIVE

## 2019-02-15 MED ORDER — CHOLESTYRAMINE LIGHT 4 G PO PACK: 4.0000 g | PACK | Freq: Two times a day (BID) | ORAL | 0 refills | Status: DC

## 2019-02-15 NOTE — Progress Notes (Signed)
[name] is here today for a postoperative visit after undergoing a combined laparoscopic cholecystectomy and a hysterectomy.  The hysterectomy was performed by Dr. Kenton Kingfisher, while I performed the cholecystectomy.  She states that she is overall, done fairly well since her operation.  She had 3 episodes of emesis since her surgery, with the most recent being on Friday of last week.  Her more concerning complaint, from her perspective, is the diarrhea that has developed since her cholecystectomy.  She states that anytime she eats, regardless of what it is, she gets diarrhea.  If she does not eat, she does not have loose stools.  She has not had any fevers or chills.  No worsening abdominal pain.  Vitals:   02/15/19 1103  BP: 118/77  Pulse: 87  Temp: 98.1 F (36.7 C)  SpO2: 97%   Focused abdominal exam: The laparoscopic trocar sites are well approximated.  There is still some Dermabond present.  There is no erythema induration or drainage.  Abdomen is soft.  Impression and plan: Debbie Townsend is a 33 year old woman who underwent a combined laparoscopic cholecystectomy and hysterectomy.  She has postcholecystectomy diarrhea symptoms.  We will first check her for C. difficile toxin, but if this is negative, she may start taking over-the-counter antidiarrheal agents such as Imodium.  In the meantime, I have prescribed a 10-day course of cholestyramine, to try and minimize her symptoms without potentially compromising her, should she have C. difficile.  I will contact her with the results of her C. difficile test, and let her know whether she may start taking Imodium.  I will see her in clinic on an as-needed basis.

## 2019-02-15 NOTE — Patient Instructions (Addendum)
We will order C-Diff due to your recent surgery. Please pick up your medication at the pharmacy.   We will call you with the results of the C-diff results.      GENERAL POST-OPERATIVE PATIENT INSTRUCTIONS   WOUND CARE INSTRUCTIONS:  Keep a dry clean dressing on the wound if there is drainage. The initial bandage may be removed after 24 hours.  Once the wound has quit draining you may leave it open to air.  If clothing rubs against the wound or causes irritation and the wound is not draining you may cover it with a dry dressing during the daytime.  Try to keep the wound dry and avoid ointments on the wound unless directed to do so.  If the wound becomes bright red and painful or starts to drain infected material that is not clear, please contact your physician immediately.  If the wound is mildly pink and has a thick firm ridge underneath it, this is normal, and is referred to as a healing ridge.  This will resolve over the next 4-6 weeks.  BATHING: You may shower if you have been informed of this by your surgeon. However, Please do not submerge in a tub, hot tub, or pool until incisions are completely sealed or have been told by your surgeon that you may do so.  DIET:  You may eat any foods that you can tolerate.  It is a good idea to eat a high fiber diet and take in plenty of fluids to prevent constipation.  If you do become constipated you may want to take a mild laxative or take ducolax tablets on a daily basis until your bowel habits are regular.  Constipation can be very uncomfortable, along with straining, after recent surgery.  ACTIVITY:  You are encouraged to cough and deep breath or use your incentive spirometer if you were given one, every 15-30 minutes when awake.  This will help prevent respiratory complications and low grade fevers post-operatively if you had a general anesthetic.  You may want to hug a pillow when coughing and sneezing to add additional support to the surgical area,  if you had abdominal or chest surgery, which will decrease pain during these times.  You are encouraged to walk and engage in light activity for the next two weeks.  You should not lift more than 20 pounds, until 03/18/2019 as it could put you at increased risk for complications.  Twenty pounds is roughly equivalent to a plastic bag of groceries. At that time- Listen to your body when lifting, if you have pain when lifting, stop and then try again in a few days. Soreness after doing exercises or activities of daily living is normal as you get back in to your normal routine.  MEDICATIONS:  Try to take narcotic medications and anti-inflammatory medications, such as tylenol, ibuprofen, naprosyn, etc., with food.  This will minimize stomach upset from the medication.  Should you develop nausea and vomiting from the pain medication, or develop a rash, please discontinue the medication and contact your physician.  You should not drive, make important decisions, or operate machinery when taking narcotic pain medication.  SUNBLOCK Use sun block to incision area over the next year if this area will be exposed to sun. This helps decrease scarring and will allow you avoid a permanent darkened area over your incision.  QUESTIONS:  Please feel free to call our office if you have any questions, and we will be glad to assist you. [phone]

## 2019-02-17 ENCOUNTER — Telehealth: Payer: Self-pay | Admitting: General Surgery

## 2019-02-17 ENCOUNTER — Encounter: Payer: BLUE CROSS/BLUE SHIELD | Admitting: General Surgery

## 2019-02-17 NOTE — Telephone Encounter (Signed)
Gave patient negative c diff results.  She states that she is no longer having diarrhea.

## 2019-02-19 ENCOUNTER — Ambulatory Visit: Payer: BLUE CROSS/BLUE SHIELD | Admitting: Obstetrics & Gynecology

## 2019-02-20 ENCOUNTER — Emergency Department
Admission: EM | Admit: 2019-02-20 | Discharge: 2019-02-20 | Disposition: A | Discharge status: Home / Self Care | Payer: BLUE CROSS/BLUE SHIELD | Attending: Emergency Medicine | Admitting: Emergency Medicine

## 2019-02-20 ENCOUNTER — Emergency Department: Payer: BLUE CROSS/BLUE SHIELD

## 2019-02-20 ENCOUNTER — Encounter: Payer: Self-pay | Admitting: Emergency Medicine

## 2019-02-20 ENCOUNTER — Other Ambulatory Visit: Payer: Self-pay

## 2019-02-20 DIAGNOSIS — F17210 Nicotine dependence, cigarettes, uncomplicated: Secondary | ICD-10-CM | POA: Insufficient documentation

## 2019-02-20 DIAGNOSIS — I10 Essential (primary) hypertension: Secondary | ICD-10-CM | POA: Diagnosis not present

## 2019-02-20 DIAGNOSIS — N938 Other specified abnormal uterine and vaginal bleeding: Secondary | ICD-10-CM | POA: Diagnosis not present

## 2019-02-20 DIAGNOSIS — R109 Unspecified abdominal pain: Secondary | ICD-10-CM | POA: Insufficient documentation

## 2019-02-20 DIAGNOSIS — N939 Abnormal uterine and vaginal bleeding, unspecified: Secondary | ICD-10-CM

## 2019-02-20 DIAGNOSIS — Z90710 Acquired absence of both cervix and uterus: Secondary | ICD-10-CM | POA: Diagnosis not present

## 2019-02-20 LAB — URINALYSIS, ROUTINE W REFLEX MICROSCOPIC
Bacteria, UA: NONE SEEN
Bilirubin Urine: NEGATIVE
Glucose, UA: NEGATIVE mg/dL
Ketones, ur: NEGATIVE mg/dL
Nitrite: NEGATIVE
Protein, ur: NEGATIVE mg/dL
Specific Gravity, Urine: 1.026 (ref 1.005–1.030)
pH: 5 (ref 5.0–8.0)

## 2019-02-20 LAB — LIPASE, BLOOD: Lipase: 32 U/L (ref 11–51)

## 2019-02-20 LAB — COMPREHENSIVE METABOLIC PANEL
ALT: 24 U/L (ref 0–44)
AST: 19 U/L (ref 15–41)
Albumin: 3.7 g/dL (ref 3.5–5.0)
Alkaline Phosphatase: 41 U/L (ref 38–126)
Anion gap: 9 (ref 5–15)
BUN: 13 mg/dL (ref 6–20)
CO2: 24 mmol/L (ref 22–32)
Calcium: 8.6 mg/dL — ABNORMAL LOW (ref 8.9–10.3)
Chloride: 104 mmol/L (ref 98–111)
Creatinine, Ser: 0.87 mg/dL (ref 0.44–1.00)
GFR calc Af Amer: >60 mL/min (ref >60)
GFR calc non Af Amer: >60 mL/min (ref >60)
Glucose, Bld: 155 mg/dL — ABNORMAL HIGH (ref 70–99)
Potassium: 3.8 mmol/L (ref 3.5–5.1)
Sodium: 137 mmol/L (ref 135–145)
Total Bilirubin: 0.5 mg/dL (ref 0.3–1.2)
Total Protein: 7.1 g/dL (ref 6.5–8.1)

## 2019-02-20 LAB — CBC WITH DIFFERENTIAL/PLATELET
Abs Immature Granulocytes: 0.13 10*3/uL — ABNORMAL HIGH (ref 0.00–0.07)
Basophils Absolute: 0.1 10*3/uL (ref 0.0–0.1)
Basophils Relative: 1 %
Eosinophils Absolute: 0.3 10*3/uL (ref 0.0–0.5)
Eosinophils Relative: 2 %
HCT: 38.4 % (ref 36.0–46.0)
Hemoglobin: 12.3 g/dL (ref 12.0–15.0)
Immature Granulocytes: 1 %
Lymphocytes Relative: 16 %
Lymphs Abs: 2.2 10*3/uL (ref 0.7–4.0)
MCH: 27.2 pg (ref 26.0–34.0)
MCHC: 32 g/dL (ref 30.0–36.0)
MCV: 84.8 fL (ref 80.0–100.0)
Monocytes Absolute: 0.6 10*3/uL (ref 0.1–1.0)
Monocytes Relative: 5 %
Neutro Abs: 10 10*3/uL — ABNORMAL HIGH (ref 1.7–7.7)
Neutrophils Relative %: 75 %
Platelets: 365 10*3/uL (ref 150–400)
RBC: 4.53 MIL/uL (ref 3.87–5.11)
RDW: 12.9 % (ref 11.5–15.5)
WBC: 13.2 10*3/uL — ABNORMAL HIGH (ref 4.0–10.5)
nRBC: 0 % (ref 0.0–0.2)

## 2019-02-20 MED ORDER — FAMOTIDINE 20 MG PO TABS: 20.0000 mg | ORAL_TABLET | Freq: Every day | ORAL | 1 refills | Status: DC

## 2019-02-20 MED ORDER — ALUM & MAG HYDROXIDE-SIMETH 200-200-20 MG/5ML PO SUSP
30.0000 mL | Freq: Once | ORAL | Status: DC
  Filled 2019-02-20: qty 30

## 2019-02-20 MED ORDER — LIDOCAINE VISCOUS HCL 2 % MT SOLN
15.0000 mL | Freq: Once | OROMUCOSAL | Status: DC
  Filled 2019-02-20: qty 15

## 2019-02-20 NOTE — ED Provider Notes (Signed)
[facility] Emergency Department Provider Note  ____________________________________________   I have reviewed the triage vital signs and the nursing notes.   HISTORY  Chief Complaint Post-op Problem   History limited by: Not Limited   HPI Debbie Townsend is a 33 y.o. female who presents to the emergency department today because of concerns for both abdominal pain as well as vaginal bleeding.  Patient underwent cholecystectomy and hysterectomy late last month.  She states that since that time she has been having left upper abdominal pain.  It will be brief and episodic.  Will last for a few seconds and be sharp.  She has not noticed any pattern to this.  It is not been accompanied by any nausea or vomiting or fevers.  In addition the patient has noticed some vaginal bleeding.  She typically notices it when she wipes although today felt a gush of blood.  She called her OB/GYN's office who recommended emergency department evaluation.  She denies any fevers, shortness of breath.  Per medical record review patient has a history of recent cholecystectomy and hysterectomy.  Past Medical History:  Diagnosis Date  . Anxiety   . BRCA negative   . Depression   . Family history of anesthesia complication     mother had Post -op nausea and dizziness.  . Foot drop, left foot   . GERD (gastroesophageal reflux disease)   . Headache   . History of kidney stones   . Hypertension   . Pseudotumor cerebri 2007 or 2009    Patient Active Problem List   Diagnosis Date Noted  . Status post laparoscopic hysterectomy 02/12/2019  . Adenomyosis 02/04/2019  . DUB (dysfunctional uterine bleeding) 09/10/2018  . Fatty liver 04/02/2018  . Gallstones 04/02/2018  . Heavy menses 04/02/2018  . Family history of breast cancer 04/02/2018  . Anxiety 04/02/2018  . BMI 40.0-44.9, adult ([place]) 01/04/2016  . Essential hypertension 11/13/2015  . Pseudotumor cerebri 11/13/2015  . HNP  (herniated nucleus pulposus), lumbar 09/22/2014    Past Surgical History:  Procedure Laterality Date  . CESAREAN SECTION     2009/2017  . CHOLECYSTECTOMY N/A 02/04/2019   Procedure: LAPAROSCOPIC CHOLECYSTECTOMY;  Surgeon: Fredirick Maudlin, MD;  Location: [facility] ORS;  Service: General;  Laterality: N/A;  . LUMBAR LAMINECTOMY/DECOMPRESSION MICRODISCECTOMY Left 09/22/2014   Procedure: Left Lumbar four-five microdiskectomy;  Surgeon: Consuella Lose, MD;  Location: [place] [facility];  Service: Neurosurgery;  Laterality: Left;  Left Lumbar four-five microdiskectomy  . WISDOM TOOTH EXTRACTION      Prior to Admission medications   Medication Sig Start Date End Date Taking? Authorizing Provider  ALPRAZolam (XANAX) 0.25 MG tablet Take 1 tablet (0.25 mg total) by mouth daily as needed for anxiety. 06/25/18   McLean-Scocuzza, Nino Glow, MD  cholestyramine light (PREVALITE) 4 g packet Take 1 packet (4 g total) by mouth 2 (two) times daily for 10 days. 02/15/19 02/25/19  Fredirick Maudlin, MD  lisinopril (PRINIVIL,ZESTRIL) 20 MG tablet Take 1 tablet (20 mg total) by mouth daily. In am Patient taking differently: Take 20 mg by mouth daily.  06/25/18   McLean-Scocuzza, Nino Glow, MD  sertraline (ZOLOFT) 50 MG tablet Take 1.5 tablets (75 mg total) by mouth daily. 07/13/18   McLean-Scocuzza, Nino Glow, MD    Allergies Apple; Carrot [daucus carota]; Monistat [miconazole]; Other; and Amoxicillin  Family History  Problem Relation Age of Onset  . Cancer Mother        breast dx'ed age 25/49  . Hypertension Mother   .  Diabetes Mother   . Hypothyroidism Mother   . Depression Mother   . Breast cancer Mother        7s  . Liver disease Father   . Heart attack Father   . Arthritis Father   . Alcohol abuse Father   . Depression Father   . Drug abuse Father   . Early death Father   . Heart disease Father   . Hypertension Father   . Depression Sister   . Hypertension Sister   . Arthritis Sister   . Hypertension Sister    . Cancer Maternal Grandmother        stomach>liver>brain met  . Cancer Maternal Uncle        lung not a smoker     Social History Social History   Tobacco Use  . Smoking status: Current Every Day Smoker    Packs/day: 0.50    Types: Cigarettes  . Smokeless tobacco: Never Used  Substance Use Topics  . Alcohol use: No    Comment: " occasionally"  . Drug use: No    Review of Systems Constitutional: No fever/chills Eyes: No visual changes. ENT: No sore throat. Cardiovascular: Denies chest pain. Respiratory: Denies shortness of breath. Gastrointestinal: Positive for left upper quadrant pain.  Genitourinary: Positive for vaginal bleeding.  Musculoskeletal: Negative for back pain. Skin: Negative for rash. Neurological: Negative for headaches, focal weakness or numbness.  ____________________________________________   PHYSICAL EXAM:  VITAL SIGNS: ED Triage Vitals  Enc Vitals Group     BP 02/20/19 1820 (!) 148/80     Pulse Rate 02/20/19 1820 78     Resp 02/20/19 1820 18     Temp 02/20/19 1820 98.1 F (36.7 C)     Temp src --      SpO2 02/20/19 1820 99 %     Weight --      Height --      Head Circumference --      Peak Flow --      Pain Score 02/20/19 1821 7   Constitutional: Alert and oriented.  Eyes: Conjunctivae are normal.  ENT      Head: Normocephalic and atraumatic.      Nose: No congestion/rhinnorhea.      Mouth/Throat: Mucous membranes are moist.      Neck: No stridor. Hematological/Lymphatic/Immunilogical: No cervical lymphadenopathy. Cardiovascular: Normal rate, regular rhythm.  No murmurs, rubs, or gallops. Respiratory: Normal respiratory effort without tachypnea nor retractions. Breath sounds are clear and equal bilaterally. No wheezes/rales/rhonchi. Gastrointestinal: Soft and non tender. No rebound. No guarding.  Genitourinary: Deferred Musculoskeletal: Normal range of motion in all extremities. No lower extremity edema. Neurologic:  Normal  speech and language. No gross focal neurologic deficits are appreciated.  Skin:  Skin is warm, dry and intact. No rash noted. Psychiatric: Mood and affect are normal. Speech and behavior are normal. Patient exhibits appropriate insight and judgment.  ____________________________________________    LABS (pertinent positives/negatives)  Lipase 32 CBC wbc 13.2, hgb 12.3, plt 365 CMP wnl except glu 155, ca 8.6 UA hazy, large hgb dipstick, moderate leukocytes, 21-50 wbc ____________________________________________   EKG  None  ____________________________________________    RADIOLOGY  US pelvis Status post hysterectomy, no fluid collection appreciated  ____________________________________________   PROCEDURES  Procedures  ____________________________________________   INITIAL IMPRESSION / ASSESSMENT AND PLAN / ED COURSE  Pertinent labs & imaging results that were available during my care of the patient were reviewed by me and considered in my medical decision making (see  chart for details).   Patient presented to the emergency department today because of concerns for both vaginal bleeding as well as left upper quadrant pain in the setting of recent hysterectomy and cholecystectomy.  In terms of the vaginal bleeding I did have a discussion with Dr. Glennon Mac with OB/GYN.  He came and evaluated the patient.  At this point low suspicion for cuff dehiscence  or infection.  Patient will follow-up with Dr. Kenton Kingfisher in clinic.  In terms of the left upper quadrant pain I do wonder if patient suffering from gastric ulcer or gastritis.  This could be related to recent stress of surgery.  Patient's blood work without any concerning leukocytosis or other findings that would suggest a significant intra-abdominal infection.  Will trial the patient on antacids. Discussed plan with patient.  ____________________________________________   FINAL CLINICAL IMPRESSION(S) / ED DIAGNOSES  Final  diagnoses:  Vaginal bleeding     Note: This dictation was prepared with Dragon dictation. Any transcriptional errors that result from this process are unintentional     Nance Pear, MD 02/20/19 2329

## 2019-02-20 NOTE — ED Triage Notes (Signed)
Pt to ED with c/o of abdominal pain that has been ongoing since having surgery 2/20. Pt had hysterectomy and gallbladder removal. Pt start having bleeding approx 1 week ago.

## 2019-02-20 NOTE — Consult Note (Signed)
GYNECOLOGY CONSULT NOTE  GYN Consultation  Attending Provider: Nance Pear, MD   Debbie Townsend [id number] 02/20/2019 10:22 PM    Reason for Consultation:   Debbie Townsend is a 33 y.o. [phone] female seen at the request of Dr. Archie Balboa for evaluation of vaginal bleeding three weeks postop from a Total Laparoscopic Hysterectomy with bilateral salpingectomy.    History of Present Ilness:   The patient states that since her last visit with Dr. Kenton Kingfisher on February 28, she began having some vaginal spotting.  She would note this on the tissue when she wiped after going to the bathroom.  She would wear a panty liner and occasionally see spots on the panty liner.  This evening she noted heavier "gush" of blood.  There were no clots.  She noted that it was enough to wet her underwear and she is continued to spot when wiping since that time.  She describes the color as dark red to brown.  She denies the passage of clots.  She denies fevers, chills, and lower abdominal pain.  She also reports to the ER with left upper quadrant and left flank pain, which the ER is evaluating as well.  She denies intercourse or having placed anything in her vagina.  She continues to maintain normal bowel and bladder habits, although she did have diarrhea for a while after her surgery.  Past Medical History:  Diagnosis Date  . Anxiety   . BRCA negative   . Depression   . Family history of anesthesia complication     mother had Post -op nausea and dizziness.  . Foot drop, left foot   . GERD (gastroesophageal reflux disease)   . Headache   . History of kidney stones   . Hypertension   . Pseudotumor cerebri 2007 or 2009   Past Surgical History:  Procedure Laterality Date  . CESAREAN SECTION     2009/2017  . CHOLECYSTECTOMY N/A 02/04/2019   Procedure: LAPAROSCOPIC CHOLECYSTECTOMY;  Surgeon: Fredirick Maudlin, MD;  Location: [facility] ORS;  Service: General;  Laterality: N/A;  . LUMBAR LAMINECTOMY/DECOMPRESSION  MICRODISCECTOMY Left 09/22/2014   Procedure: Left Lumbar four-five microdiskectomy;  Surgeon: Consuella Lose, MD;  Location: [place] [facility] OR[facility];  Service: Neurosurgery;  Laterality: Left;  Left Lumbar four-five microdiskectomy  . WISDOM TOOTH EXTRACTION     Allergies  Allergen Reactions  . Apple Other (See Comments)    Lips itch and swell, inside of mouth gets hives  . Carrot [Daucus Carota] Other (See Comments)    Lips itch and swell, inside of mouth gets hives  . Monistat [Miconazole] Swelling    Severe vaginal itching and swelling  . Other Itching and Swelling    Almonds-mouth swells and itches  . Amoxicillin Hives, Itching, Swelling and Dermatitis    Did it involve swelling of the face/tongue/throat, SOB, or low BP? No Did it involve sudden or severe rash/hives, skin peeling, or any reaction on the inside of your mouth or nose? Yes Did you need to seek medical attention at a hospital or doctor's office? Yes When did it last happen?within the past 5 years If all above answers are "NO", may proceed with cephalosporin use.    Prior to Admission medications   Medication Sig Start Date End Date Taking? Authorizing Provider  ALPRAZolam (XANAX) 0.25 MG tablet Take 1 tablet (0.25 mg total) by mouth daily as needed for anxiety. 06/25/18   McLean-Scocuzza, Nino Glow, MD  cholestyramine light (PREVALITE) 4 g packet Take 1 packet (4  g total) by mouth 2 (two) times daily for 10 days. 02/15/19 02/25/19  Fredirick Maudlin, MD  lisinopril (PRINIVIL,ZESTRIL) 20 MG tablet Take 1 tablet (20 mg total) by mouth daily. In am Patient taking differently: Take 20 mg by mouth daily.  06/25/18   McLean-Scocuzza, Nino Glow, MD  sertraline (ZOLOFT) 50 MG tablet Take 1.5 tablets (75 mg total) by mouth daily. 07/13/18   McLean-Scocuzza, Nino Glow, MD    Social History:  She  reports that she has been smoking cigarettes. She has been smoking about 0.50 packs per day. She has never used smokeless tobacco. She reports that she  does not drink alcohol or use drugs.  Family History:  family history includes Alcohol abuse in her father; Arthritis in her father and sister; Breast cancer in her mother; Cancer in her maternal grandmother, maternal uncle, and mother; Depression in her father, mother, and sister; Diabetes in her mother; Drug abuse in her father; Early death in her father; Heart attack in her father; Heart disease in her father; Hypertension in her father, mother, sister, and sister; Hypothyroidism in her mother; Liver disease in her father.   Review of Systems:  Review of Systems  Constitutional: Negative.   HENT: Negative.   Eyes: Negative.   Respiratory: Negative.   Cardiovascular: Negative.   Gastrointestinal: Positive for abdominal pain (LUQ/left flank). Negative for blood in stool, constipation, diarrhea, heartburn, melena, nausea and vomiting.  Genitourinary: Negative.        See HPI  Musculoskeletal: Negative.   Skin: Negative.   Neurological: Negative.   Psychiatric/Behavioral: Negative.       Objective    BP (!) 148/80 (BP Location: Left Arm)   Pulse 78   Temp 98.1 F (36.7 C)   Resp 18   LMP 02/15/2019   SpO2 99%  Physical Exam  Physical Exam Exam conducted with a chaperone present.  Constitutional:      General: She is not in acute distress.    Appearance: Normal appearance. She is obese.  HENT:     Head: Normocephalic and atraumatic.  Eyes:     General: No scleral icterus.    Conjunctiva/sclera: Conjunctivae normal.  Cardiovascular:     Rate and Rhythm: Normal rate and regular rhythm.  Pulmonary:     Effort: Pulmonary effort is normal.     Breath sounds: Normal breath sounds.  Abdominal:     General: There is no distension.     Palpations: Abdomen is soft.     Tenderness: There is no abdominal tenderness. There is no guarding.     Comments: All incision sites clean, dry, and intact without erythema, induration, warmth, and tenderness  Genitourinary:    General: Normal  vulva.     Exam position: Lithotomy position.     Labia:        Right: No rash, tenderness, lesion or injury.        Left: No rash, tenderness, lesion or injury.      Vagina: Normal. No signs of injury. No vaginal discharge or tenderness.     Comments: Uterus and Cervix surgically absent. Vaginal cuff intact without erythema. There is dark red/brown scan thin discharge in the vagina. There is no dehiscence of the incision line. No apparent granulation tissue.  Bimanual reveal an intact vaginal cuff without induration and warmth.  There is mild tenderness to palpation along the cuff line.  Musculoskeletal: Normal range of motion.        General: No swelling.  Skin:  General: Skin is warm and dry.     Coloration: Skin is not jaundiced.  Neurological:     General: No focal deficit present.     Mental Status: She is alert and oriented to person, place, and time.     Cranial Nerves: No cranial nerve deficit.  Psychiatric:        Mood and Affect: Mood normal.        Behavior: Behavior normal.        Judgment: Judgment normal.    Female chaperone present for pelvic exam:     Laboratory Results:   Lab Results  Component Value Date   WBC 13.2 (H) 02/20/2019   RBC 4.53 02/20/2019   HGB 12.3 02/20/2019   HCT 38.4 02/20/2019   PLT 365 02/20/2019   NA 137 02/20/2019   K 3.8 02/20/2019   CREATININE 0.87 02/20/2019   Lab Results  Component Value Date   PREGTESTUR NEGATIVE 02/04/2019   PREGSERUM NEGATIVE 09/22/2014    Imaging Results:  US Pelvis (transabdominal Only)  Result Date: 02/20/2019 CLINICAL DATA:  Vaginal bleeding post hysterectomy on 02/04/2019 EXAM: TRANSABDOMINAL ULTRASOUND OF PELVIS TECHNIQUE: Transabdominal ultrasound examination of the pelvis was performed including evaluation of the uterus, ovaries, adnexal regions, and pelvic cul-de-sac. COMPARISON:  None. FINDINGS: Uterus Surgically absent. Endometrium Not applicable Right ovary Not visualized Left ovary Not  visualized Other findings:  No abnormal free fluid. IMPRESSION: Status post hysterectomy. No adnexal mass. Neither ovary was visualized. There is no free fluid or fluid collections identified at site of hysterectomy. Electronically Signed   By: Ashley Royalty M.D.   On: 02/20/2019 19:13    Assessment & Recommendations   Debbie Townsend is a 33 y.o. G37P1102 female with vaginal bleeding, about three weeks out from a TLH/BS being seen in consultation.    There is no evidence of dehiscence or granulation tissue formation. Though there is some tenderness to palpation, there is no other evidence to support an infection of the vaginal cuff.  The patient is reassured at this time that she has no emergent concerns with regards to her vaginal incision.  The source of her bleeding could be from stitch separation, as the bleeding appears to be a small amount.  She is OK to be discharged without further intervention from a vaginal bleeding standpoint.    I did recommend to her that she call the office on Monday morning, in about 36 hours, to contact Dr. Kenton Kingfisher for further instruction or precaution. I did precaution her about heavy vaginal bleeding with bright red blood and the passage of clots. I further re-enforced precautions for fever, chills, and lower abdominal pain.    25 minutes spent in face to face discussion with > 50% spent in counseling,management, and coordination of care of her vaginal bleeding after pelvic, gynecologic surgery.   Prentice Docker, MD 02/20/2019 10:22 PM

## 2019-02-20 NOTE — Discharge Instructions (Signed)
Please seek medical attention for any high fevers, chest pain, shortness of breath, change in behavior, persistent vomiting, bloody stool or any other new or concerning symptoms.  

## 2019-02-20 NOTE — ED Notes (Signed)
Reviewed discharge instructions, follow-up care with patient. Patient verbalized understanding of all information reviewed. Patient stable, with no distress noted at this time.    

## 2019-02-20 NOTE — ED Notes (Signed)
Surgical consult MD at bedside with Manuela Schwartz for pelvic exam

## 2019-02-20 NOTE — ED Notes (Signed)
Patient reports that she had both a hysterectomy and a cholecystectomy on 2/20. Patient reports heavy menstrual bleeding prior to hysterectomy, along with cyst on an ovary. Patient c/o vaginal bleeding X 1 week. Patient reports 2 occurences of heavy bleeding. Patient reports mostly she just sees the blood when she wiped. Patient reports that when she was out today she felt a gush of blood.   Patient c/o intermittent LUQ pain described as spasm/cramp that has been going on since her surgery. Patient reports the severity of the pain increased today. Patient is tender to palpation of this area.

## 2019-02-22 ENCOUNTER — Telehealth: Payer: Self-pay

## 2019-02-22 ENCOUNTER — Other Ambulatory Visit: Payer: Self-pay

## 2019-02-22 NOTE — Telephone Encounter (Signed)
Pt reports bleeding has gotten better, will call us if needed

## 2019-02-22 NOTE — Telephone Encounter (Signed)
-----   Message from Gae Dry, MD sent at 02/22/2019  7:47 AM EDT ----- Regarding: call and ck on pt Seen in ER by Glennon Mac over weekend for bleeding after hysterectomy (deemed to be OK) Just check and see how she is doing. Reassure some bleeding is ok while healing, but if it continues this week I can see her again.

## 2019-03-18 ENCOUNTER — Ambulatory Visit: Payer: Self-pay

## 2019-03-18 DIAGNOSIS — J069 Acute upper respiratory infection, unspecified: Secondary | ICD-10-CM | POA: Diagnosis not present

## 2019-03-18 NOTE — Telephone Encounter (Signed)
Patient called with c/o "cough, congestion, sore throat, chest burning/pain with coughing." She says all of these symptoms started yesterday afternoon. She says the cough is pretty intense today, coughing up a yellowish tint, thin and sometimes a clump of phlegm. She denies fever, says she woke up in a sweat during the night, but the highest reading is 96.3. She says her chest constantly burns and when she coughs it hurts. She says she has sinus congestion and a sore throat, she's used saline nasal spray, but it didn't help. She says she has a headache as well. I asked about exposure, she says she's been working from home the past 2 weeks, her husband works in and out of [place] and is still working. She says she's been around her mother who works at [employer] in the [place], but is now working from home. She says her last time going out to the grocery store was last week. She denies travel exposure or exposure to anyone positive for COVID-19.  She says she feels tired. I verified her email address and advised she may have a webex visit. I called the office and spoke to [place], [facility]. She asked me to hold while she speak to Dr. Terese Door CMA about the patient's symptoms. She was advised by Dr. Aundra Dubin for the patient to go to the UC, because she doesn't feel comfortable with a web visit with the symptoms the patient is having. I advised the patient of the recommendation, she says she will go to the UC. I advised [facility] or [place].   Reason for Disposition . [1] Continuous (nonstop) coughing interferes with work or school AND [2] no improvement using cough treatment per Care Advice  Answer Assessment - Initial Assessment Questions 1. ONSET: "When did the cough begin?"      Yesterday afternoon 2. SEVERITY: "How bad is the cough today?"      Pretty intense 3. RESPIRATORY DISTRESS: "Describe your breathing."      Chest tight, so hard to  breathe 4. FEVER: "Do you have a fever?" If so, ask: "What is your temperature, how was it measured, and when did it start?"     No 5. SPUTUM: "Describe the color of your sputum" (clear, white, yellow, green)     Yellow tint, sometimes thin, other times a clump 6. HEMOPTYSIS: "Are you coughing up any blood?" If so ask: "How much?" (flecks, streaks, tablespoons, etc.)     No 7. CARDIAC HISTORY: "Do you have any history of heart disease?" (e.g., heart attack, congestive heart failure)      No 8. LUNG HISTORY: "Do you have any history of lung disease?"  (e.g., pulmonary embolus, asthma, emphysema)     No 9. PE RISK FACTORS: "Do you have a history of blood clots?" (or: recent major surgery, recent prolonged travel, bedridden)     No 10. OTHER SYMPTOMS: "Do you have any other symptoms?" (e.g., runny nose, wheezing, chest pain)      Sinus congestion, headache, chest pain with coughing, sore throat 11. PREGNANCY: "Is there any chance you are pregnant?" "When was your last menstrual period?"       No 12. TRAVEL: "Have you traveled out of the country in the last month?" (e.g., travel history, exposures)      No  Protocols used: [place]

## 2019-03-18 NOTE — Telephone Encounter (Signed)
Best rec. For sx's  [place]

## 2019-03-19 ENCOUNTER — Encounter: Payer: Self-pay | Admitting: Obstetrics & Gynecology

## 2019-03-19 ENCOUNTER — Ambulatory Visit: Payer: BLUE CROSS/BLUE SHIELD | Admitting: Obstetrics & Gynecology

## 2019-03-19 ENCOUNTER — Ambulatory Visit (INDEPENDENT_AMBULATORY_CARE_PROVIDER_SITE_OTHER): Payer: BLUE CROSS/BLUE SHIELD | Admitting: Obstetrics & Gynecology

## 2019-03-19 ENCOUNTER — Other Ambulatory Visit: Payer: Self-pay

## 2019-03-19 VITALS — Ht 67.0 in | Wt 273.0 lb

## 2019-03-19 DIAGNOSIS — Z6841 Body Mass Index (BMI) 40.0 and over, adult: Secondary | ICD-10-CM

## 2019-03-19 DIAGNOSIS — Z90710 Acquired absence of both cervix and uterus: Secondary | ICD-10-CM

## 2019-03-19 NOTE — Progress Notes (Signed)
Virtual Visit via Telephone Note  I connected with Debbie Townsend on 03/19/19 at  8:40 AM EDT by telephone and verified that I am speaking with the correct person using two identifiers.   I discussed the limitations, risks, security and privacy concerns of performing an evaluation and management service by telephone and the availability of in person appointments. I also discussed with the patient that there may be a patient responsible charge related to this service. The patient expressed understanding and agreed to proceed.  She was at home and I was in my office.  History of Present Illness:  Patient presents post op from [facility] as well as Lap [facility] for abnormal uterine bleeding, 6 weeks ago.  Subjective: Patient reports marked improvement in her preop symptoms. Eating a regular diet without difficulty. The patient is not having any pain.  Activity: normal activities of daily living. Patient reports additional symptom's since surgery of no bleeding for > 1 month since surgery.  Pt also expresses concern over weight; she was 273 lbs yesterday (263 last visit here).  She has always had weight problems.   Observations/Objective: No exam today, due to telephone eVisit due to [facility] virus restriction on elective visits and procedures.  Prior visits reviewed along with ultrasounds/labs as indicated.  Assessment and Plan: 1. Status post laparoscopic hysterectomy Doing well.  Resume activities.  Alert to any bleeding w sex or other activities.  2. BMI 40.0-44.9, adult ([place]) Discussed weight loss diet, exercise, meds and surgery options Will increase exercise and follow healthy diet.  To monitor weight loss and report back when she reaches an end point or plateau.  Follow Up Instructions: PRN for annual or for weight loss instruction   I discussed the assessment and treatment plan with the patient. The patient was provided an opportunity to ask questions and all were answered. The patient  agreed with the plan and demonstrated an understanding of the instructions.   The patient was advised to call back or seek an in-person evaluation if the symptoms worsen or if the condition fails to improve as anticipated.  I provided 10 minutes of non-face-to-face time during this encounter.  Hoyt Koch, MD [facility], [facility] 03/19/2019  8:55 AM

## 2019-03-24 ENCOUNTER — Ambulatory Visit: Payer: BLUE CROSS/BLUE SHIELD | Admitting: Internal Medicine

## 2019-03-24 ENCOUNTER — Ambulatory Visit: Payer: Self-pay

## 2019-03-24 ENCOUNTER — Encounter: Payer: Self-pay | Admitting: Internal Medicine

## 2019-03-24 ENCOUNTER — Ambulatory Visit (INDEPENDENT_AMBULATORY_CARE_PROVIDER_SITE_OTHER): Payer: BLUE CROSS/BLUE SHIELD | Admitting: Internal Medicine

## 2019-03-24 DIAGNOSIS — J40 Bronchitis, not specified as acute or chronic: Secondary | ICD-10-CM | POA: Diagnosis not present

## 2019-03-24 DIAGNOSIS — J309 Allergic rhinitis, unspecified: Secondary | ICD-10-CM | POA: Diagnosis not present

## 2019-03-24 MED ORDER — AZITHROMYCIN 250 MG PO TABS: ORAL_TABLET | ORAL | 0 refills | Status: DC

## 2019-03-24 MED ORDER — HYDROCOD POLST-CPM POLST ER 10-8 MG/5ML PO SUER: 5.0000 mL | Freq: Every evening | ORAL | 0 refills | Status: DC | PRN

## 2019-03-24 MED ORDER — FLUCONAZOLE 150 MG PO TABS: 150.0000 mg | ORAL_TABLET | Freq: Once | ORAL | 0 refills | Status: AC

## 2019-03-24 MED ORDER — ALBUTEROL SULFATE HFA 108 (90 BASE) MCG/ACT IN AERS: 1.0000 | INHALATION_SPRAY | RESPIRATORY_TRACT | 0 refills | Status: DC | PRN

## 2019-03-24 NOTE — Progress Notes (Signed)
Virtual Visit via Video Note via [employer]  I connected with Debbie Townsend on 03/24/19 at 11:38 AM EDT by a video enabled telemedicine application and verified that I am speaking with the correct person using two identifiers. Location patient: home Location provider:work  Persons participating in the virtual visit: patient, provider  I discussed the limitations of evaluation and management by telemedicine and the availability of in person appointments. The patient expressed understanding and agreed to proceed.   HPI: Cough x few days [facility] c/o cough with green phelgm and wheezing they gave her tessalon perles and delsym w/o help so she has been taking Mucinex. No family 2 kids or husband are sick and she has been working from home. She does have allergies, denies running a fever. C/o nasal congestion as well taking xyzal or zyrtec and using nasal saline nose spray and generic flonase. No fever or sick contacts. She is forcefully coughing but no sob she did have some wheezing noted yesterday.  She has cough drops and tea with honey and lemon to try and has used an albuterol inhaler. [facility] instructed her to call PCPs office today and did not do CXR yesterday but initially they were going to. No COVID exposure that she knows. She also c/o b/l ab pain from coughing    ROS: See pertinent positives and negatives per HPI.  Past Medical History:  Diagnosis Date  . Anxiety   . BRCA negative   . Depression   . Family history of anesthesia complication     mother had Post -op nausea and dizziness.  . Foot drop, left foot   . GERD (gastroesophageal reflux disease)   . Headache   . History of kidney stones   . Hypertension   . Pseudotumor cerebri 2007 or 2009    Past Surgical History:  Procedure Laterality Date  . CESAREAN SECTION     2009/2017  . CHOLECYSTECTOMY N/A 02/04/2019   Procedure: LAPAROSCOPIC CHOLECYSTECTOMY;  Surgeon: Fredirick Maudlin, MD;  Location:  [facility] ORS;  Service: General;  Laterality: N/A;  . LUMBAR LAMINECTOMY/DECOMPRESSION MICRODISCECTOMY Left 09/22/2014   Procedure: Left Lumbar four-five microdiskectomy;  Surgeon: Consuella Lose, MD;  Location: [place] [facility] ORS;  Service: Neurosurgery;  Laterality: Left;  Left Lumbar four-five microdiskectomy  . WISDOM TOOTH EXTRACTION      Family History  Problem Relation Age of Onset  . Cancer Mother        breast dx'ed age 28/49  . Hypertension Mother   . Diabetes Mother   . Hypothyroidism Mother   . Depression Mother   . Breast cancer Mother        70s  . Liver disease Father   . Heart attack Father   . Arthritis Father   . Alcohol abuse Father   . Depression Father   . Drug abuse Father   . Early death Father   . Heart disease Father   . Hypertension Father   . Depression Sister   . Hypertension Sister   . Arthritis Sister   . Hypertension Sister   . Cancer Maternal Grandmother        stomach>liver>brain met  . Cancer Maternal Uncle        lung not a smoker     SOCIAL HX: married with 2 kids lives in [place]   Current Outpatient Medications:  .  albuterol (PROVENTIL HFA;VENTOLIN HFA) 108 (90 Base) MCG/ACT inhaler, Inhale 1-2 puffs into the lungs every 4 (four) hours  as needed for wheezing or shortness of breath., Disp: 1 Inhaler, Rfl: 0 .  ALPRAZolam (XANAX) 0.25 MG tablet, Take 1 tablet (0.25 mg total) by mouth daily as needed for anxiety. (Patient not taking: Reported on 03/19/2019), Disp: 30 tablet, Rfl: 0 .  azithromycin (ZITHROMAX) 250 MG tablet, 2 pills day 1 and 1 pill day 2-5 with food, Disp: 6 tablet, Rfl: 0 .  chlorpheniramine-HYDROcodone (TUSSIONEX PENNKINETIC ER) 10-8 MG/5ML SUER, Take 5 mLs by mouth at bedtime as needed for cough., Disp: 140 mL, Rfl: 0 .  cholestyramine light (PREVALITE) 4 g packet, Take 1 packet (4 g total) by mouth 2 (two) times daily for 10 days., Disp: 20 packet, Rfl: 0 .  famotidine (PEPCID) 20 MG tablet, Take 1 tablet (20 mg total) by  mouth daily. (Patient not taking: Reported on 03/19/2019), Disp: 30 tablet, Rfl: 1 .  fluconazole (DIFLUCAN) 150 MG tablet, Take 1 tablet (150 mg total) by mouth once for 1 dose., Disp: 1 tablet, Rfl: 0 .  lisinopril (PRINIVIL,ZESTRIL) 20 MG tablet, Take 1 tablet (20 mg total) by mouth daily. In am (Patient taking differently: Take 20 mg by mouth daily. ), Disp: 90 tablet, Rfl: 3 .  sertraline (ZOLOFT) 50 MG tablet, Take 1.5 tablets (75 mg total) by mouth daily., Disp: 140 tablet, Rfl: 1  EXAM:  VITALS per patient if applicable:  GENERAL: alert, oriented, appears well and in no acute distress  HEENT: atraumatic, conjunttiva clear, no obvious abnormalities on inspection of external nose and ears  NECK: normal movements of the head and neck  LUNGS: on inspection no signs of respiratory distress, breathing rate appears normal, no obvious gross SOB, gasping or wheezing Coughing on telephone video forcefully today   CV: no obvious cyanosis  MS: moves all visible extremities without noticeable abnormality  PSYCH/NEURO: pleasant and cooperative, no obvious depression or anxiety, speech and thought processing grossly intact  ASSESSMENT AND PLAN:  Discussed the following assessment and plan:  Bronchitis - Plan: azithromycin (ZITHROMAX) 250 MG tablet, fluconazole (DIFLUCAN) 150 MG tablet, chlorpheniramine-HYDROcodone (TUSSIONEX PENNKINETIC ER) 10-8 MG/5ML SUER, albuterol (PROVENTIL HFA;VENTOLIN HFA) 108 (90 Base) MCG/ACT inhaler  Allergic rhinitis, unspecified seasonality, unspecified trigger Continue mucinex during the day, cough drops, tea with honey and lemon, prn Albuterol inahler Cont NS, otc flonase generic and zyrtec or xyzal  If worse over weekend go to ED Call back tomorrow if not better   I discussed the assessment and treatment plan with the patient. The patient was provided an opportunity to ask questions and all were answered. The patient agreed with the plan and demonstrated an  understanding of the instructions.   The patient was advised to call back or seek an in-person evaluation if the symptoms worsen or if the condition fails to improve as anticipated.  I provided 25 minutes of non-face-to-face time during this encounter.   Nino Glow McLean-Scocuzza, MD

## 2019-03-24 NOTE — Telephone Encounter (Signed)
Call pt to check into [email or web address] please

## 2019-03-24 NOTE — Telephone Encounter (Signed)
Pt c/o frequent harsh raspy cough. Pt stated she was seen in UC for her cough and was prescribed tessalon perels and delsym. Pt stated she coughs so hard that she has pulled 2 muscles in her abdomen. Pt is coughing up thick green secretions. No SOB or fever. Pt stated she is no longer wheezing. Pt occasionally vomits after coughing episodes. Pt given care advice and pt verbalized understanding.  Call warm transferred to [facility].       Reason for Disposition . SEVERE coughing spells (e.g., whooping sound after coughing, vomiting after coughing)  Answer Assessment - Initial Assessment Questions 1. ONSET: "When did the cough begin?"      03/19/19 2. SEVERITY: "How bad is the cough today?"       Frequent hard raspy cough 3. RESPIRATORY DISTRESS: "Describe your breathing."      After coughing spells 4. FEVER: "Do you have a fever?" If so, ask: "What is your temperature, how was it measured, and when did it start?"     no 5. SPUTUM: "Describe the color of your sputum" (clear, white, yellow, green)    Thick green phlegn 6. HEMOPTYSIS: "Are you coughing up any blood?" If so ask: "How much?" (flecks, streaks, tablespoons, etc.)     no 7. CARDIAC HISTORY: "Do you have any history of heart disease?" (e.g., heart attack, congestive heart failure)      HTN 8. LUNG HISTORY: "Do you have any history of lung disease?"  (e.g., pulmonary embolus, asthma, emphysema)     no 9. PE RISK FACTORS: "Do you have a history of blood clots?" (or: recent major surgery, recent prolonged travel, bedridden)     no 10. OTHER SYMPTOMS: "Do you have any other symptoms?" (e.g., runny nose, wheezing, chest pain)     Lower abdomen stinging with coughing, runny nose (clear drainage) ear congestion "popping" no pain to the ear 11. PREGNANCY: "Is there any chance you are pregnant?" "When was your last menstrual period?"       No hysterectomy 12. TRAVEL: "Have you traveled out of the country in the last month?" (e.g., travel  history, exposures)       no  Protocols used: [place]

## 2019-03-25 ENCOUNTER — Telehealth: Payer: Self-pay | Admitting: Internal Medicine

## 2019-03-25 NOTE — Telephone Encounter (Signed)
sch f/u by 10/2019 sooner if needed   thanks [place]

## 2019-03-31 ENCOUNTER — Encounter: Payer: Self-pay | Admitting: Obstetrics & Gynecology

## 2019-03-31 ENCOUNTER — Encounter: Payer: Self-pay | Admitting: Internal Medicine

## 2019-03-31 ENCOUNTER — Other Ambulatory Visit: Payer: Self-pay | Admitting: Obstetrics & Gynecology

## 2019-03-31 DIAGNOSIS — Z6841 Body Mass Index (BMI) 40.0 and over, adult: Secondary | ICD-10-CM

## 2019-03-31 MED ORDER — PHENTERMINE HCL 37.5 MG PO TABS: ORAL_TABLET | ORAL | 0 refills | Status: DC

## 2019-03-31 NOTE — Telephone Encounter (Signed)
Sch follow up appt one month from now (will determine tele vs office closer to that time)

## 2019-04-01 NOTE — Telephone Encounter (Signed)
Pt is scheduled in November.

## 2019-04-08 ENCOUNTER — Telehealth: Payer: Self-pay | Admitting: Internal Medicine

## 2019-04-08 NOTE — Telephone Encounter (Signed)
I just know that the place that did my mammogram and ultrasound said that I needed to get a new billing code from you because the one you provided the [facility] isn't correct with the insurance. After speaking with insurance company, the code you provided the [place] isn't a preventive - family history code. The insurance company and [place] told me to ask you for a preventive-family history code in order for my insurance company to cover the bill at 100%. They have said it can be covered at 100% but I need a different code. Thanks!  ----- Message -----   Debbie Townsend   can you call [facility] billing dept and give them codes N63.0 Z80.3 right breast lump and FH breast cancer mom  Thanks  [place]

## 2019-04-16 ENCOUNTER — Encounter: Payer: Self-pay | Admitting: Obstetrics & Gynecology

## 2019-04-16 ENCOUNTER — Other Ambulatory Visit: Payer: Self-pay | Admitting: Internal Medicine

## 2019-04-16 ENCOUNTER — Encounter: Payer: Self-pay | Admitting: Internal Medicine

## 2019-04-16 DIAGNOSIS — F419 Anxiety disorder, unspecified: Secondary | ICD-10-CM

## 2019-04-16 MED ORDER — SERTRALINE HCL 100 MG PO TABS: 100.0000 mg | ORAL_TABLET | Freq: Every day | ORAL | 3 refills | Status: DC

## 2019-04-30 ENCOUNTER — Encounter: Payer: Self-pay | Admitting: Obstetrics & Gynecology

## 2019-04-30 NOTE — Telephone Encounter (Signed)
Please advise 

## 2019-05-03 ENCOUNTER — Other Ambulatory Visit: Payer: Self-pay

## 2019-05-03 ENCOUNTER — Encounter: Payer: Self-pay | Admitting: Obstetrics & Gynecology

## 2019-05-03 ENCOUNTER — Ambulatory Visit: Payer: BLUE CROSS/BLUE SHIELD | Admitting: Obstetrics & Gynecology

## 2019-05-03 ENCOUNTER — Ambulatory Visit (INDEPENDENT_AMBULATORY_CARE_PROVIDER_SITE_OTHER): Payer: BLUE CROSS/BLUE SHIELD | Admitting: Obstetrics & Gynecology

## 2019-05-03 VITALS — Ht 67.0 in | Wt 265.0 lb

## 2019-05-03 DIAGNOSIS — Z6841 Body Mass Index (BMI) 40.0 and over, adult: Secondary | ICD-10-CM | POA: Diagnosis not present

## 2019-05-03 MED ORDER — PHENTERMINE HCL 37.5 MG PO TABS: ORAL_TABLET | ORAL | 1 refills | Status: DC

## 2019-05-03 NOTE — Progress Notes (Signed)
Virtual Visit via Telephone Note  I connected with Debbie Townsend on 05/03/19 at  2:50 PM EDT by telephone and verified that I am speaking with the correct person using two identifiers.   I discussed the limitations, risks, security and privacy concerns of performing an evaluation and management service by telephone and the availability of in person appointments. I also discussed with the patient that there may be a patient responsible charge related to this service. The patient expressed understanding and agreed to proceed.  She was at home and I was in my office.  History of Present Illness:  Debbie Townsend is a 33 y.o. who was started on Phentermine approximately 4 weeks ago due to obesity/abnormal weight gain. The patient has lost 9 pounds over the past mointh due to meds and lifestyle measures (walks daily)..   She has these side effects: none.  PMHx: She  has a past medical history of Anxiety, BRCA negative, Depression, Family history of anesthesia complication, Foot drop, left foot, GERD (gastroesophageal reflux disease), Headache, History of kidney stones, Hypertension, and Pseudotumor cerebri (2007 or 2009). Also,  has a past surgical history that includes Lumbar laminectomy/decompression microdiscectomy (Left, 09/22/2014); Cesarean section; Wisdom tooth extraction; and Cholecystectomy (N/A, 02/04/2019)., family history includes Alcohol abuse in her father; Arthritis in her father and sister; Breast cancer in her mother; Cancer in her maternal grandmother, maternal uncle, and mother; Depression in her father, mother, and sister; Diabetes in her mother; Drug abuse in her father; Early death in her father; Heart attack in her father; Heart disease in her father; Hypertension in her father, mother, sister, and sister; Hypothyroidism in her mother; Liver disease in her father.,  reports that she has been smoking cigarettes. She has been smoking about 0.50 packs per day. She has never used smokeless  tobacco. She reports that she does not drink alcohol or use drugs.  She has a current medication list which includes the following prescription(s): albuterol, alprazolam, phentermine, sertraline, and cholestyramine light. Also, is allergic to apple; carrot [daucus carota]; monistat [miconazole]; other; and amoxicillin.  Review of Systems  All other systems reviewed and are negative.   Observations/Objective: No exam today, due to telephone eVisit due to [facility] virus restriction on elective visits and procedures.  Prior visits reviewed along with ultrasounds/labs as indicated. Ht 5' 7" (1.702 m)   Wt 265 lb (120.2 kg)   LMP 02/15/2019   BMI 41.50 kg/m  (reported)  Assessment and Plan: 1. Class 3 severe obesity due to excess calories without serious comorbidity with body mass index (BMI) of 40.0 to 44.9 in adult [facility]) Medication treatment is going well for her.  Plan: Patient is continued/added to prescription appetite suppressants: Phentermine, self-directed dieting and increase exercise.   Will continue to assist patient in incorporating positive experiences into her life to promote a positive mental attitude.  Education given regarding appropriate lifestyle changes for weight loss, including regular physical activity, healthy coping strategies, caloric restriction, and healthy eating patterns.  The risks and benefits as well as side effects of medication, such as Phenteramine or Tenuate, is discussed.  The pros and cons of suppressing appetite and boosting metabolism is counseled.  Risks of tolerance and addiction discussed.  Use of medicine will be short term.  Pt to call with any negative side effects and agrees to keep follow up appointments.   Follow Up Instructions: 2 mos   I discussed the assessment and treatment plan with the patient. The patient was provided an  opportunity to ask questions and all were answered. The patient agreed with the plan and demonstrated an  understanding of the instructions.   The patient was advised to call back or seek an in-person evaluation if the symptoms worsen or if the condition fails to improve as anticipated.  I provided 12 minutes of non-face-to-face time during this encounter.   Hoyt Koch, MD

## 2019-05-04 ENCOUNTER — Ambulatory Visit: Payer: BLUE CROSS/BLUE SHIELD | Admitting: Obstetrics & Gynecology

## 2019-05-06 ENCOUNTER — Encounter: Payer: Self-pay | Admitting: Internal Medicine

## 2019-05-06 NOTE — Telephone Encounter (Signed)
Can the codes N63.0 and Z80.3 please be sent. The provider([facility]) told Pt they still have not received the codes. Please fax to [phone] Attention: The billing Dept

## 2019-05-07 NOTE — Telephone Encounter (Signed)
Pt called back and spoke with Lenna Sciara, this was handled through [place].  Nina,cma

## 2019-05-07 NOTE — Telephone Encounter (Signed)
Spoke to patient. Informed her that the new codes was faxed recently. She gave me a different fax number to send them to. Refaxed to [phone]

## 2019-05-17 ENCOUNTER — Other Ambulatory Visit: Payer: Self-pay | Admitting: Ophthalmology

## 2019-05-17 DIAGNOSIS — G932 Benign intracranial hypertension: Secondary | ICD-10-CM | POA: Diagnosis not present

## 2019-05-17 DIAGNOSIS — H4720 Unspecified optic atrophy: Secondary | ICD-10-CM

## 2019-05-21 ENCOUNTER — Ambulatory Visit
Admission: RE | Admit: 2019-05-21 | Discharge: 2019-05-21 | Disposition: A | Discharge status: Home / Self Care | Payer: BLUE CROSS/BLUE SHIELD | Source: Ambulatory Visit | Attending: Ophthalmology | Admitting: Ophthalmology

## 2019-05-21 ENCOUNTER — Other Ambulatory Visit: Payer: Self-pay

## 2019-05-21 DIAGNOSIS — H5462 Unqualified visual loss, left eye, normal vision right eye: Secondary | ICD-10-CM | POA: Diagnosis not present

## 2019-05-21 DIAGNOSIS — H4720 Unspecified optic atrophy: Secondary | ICD-10-CM | POA: Insufficient documentation

## 2019-05-21 DIAGNOSIS — R51 Headache: Secondary | ICD-10-CM | POA: Diagnosis not present

## 2019-05-21 LAB — POCT I-STAT CREATININE: Creatinine, Ser: 0.8 mg/dL (ref 0.44–1.00)

## 2019-05-21 MED ORDER — GADOBUTROL 1 MMOL/ML IV SOLN
10.0000 mL | Freq: Once | INTRAVENOUS | Status: AC | PRN
  Administered 2019-05-21: 10 mL via INTRAVENOUS

## 2019-05-24 ENCOUNTER — Encounter: Payer: Self-pay | Admitting: Obstetrics & Gynecology

## 2019-05-26 DIAGNOSIS — G43019 Migraine without aura, intractable, without status migrainosus: Secondary | ICD-10-CM | POA: Diagnosis not present

## 2019-05-26 DIAGNOSIS — G932 Benign intracranial hypertension: Secondary | ICD-10-CM | POA: Diagnosis not present

## 2019-07-04 ENCOUNTER — Encounter: Payer: Self-pay | Admitting: Obstetrics & Gynecology

## 2019-07-05 NOTE — Telephone Encounter (Signed)
Is this okay?

## 2019-07-06 ENCOUNTER — Ambulatory Visit (INDEPENDENT_AMBULATORY_CARE_PROVIDER_SITE_OTHER): Payer: BC Managed Care – PPO | Admitting: Obstetrics & Gynecology

## 2019-07-06 ENCOUNTER — Other Ambulatory Visit: Payer: Self-pay

## 2019-07-06 ENCOUNTER — Encounter: Payer: Self-pay | Admitting: Obstetrics & Gynecology

## 2019-07-06 VITALS — Ht 66.0 in | Wt 255.0 lb

## 2019-07-06 DIAGNOSIS — Z6841 Body Mass Index (BMI) 40.0 and over, adult: Secondary | ICD-10-CM

## 2019-07-06 MED ORDER — PHENTERMINE HCL 37.5 MG PO TABS: ORAL_TABLET | ORAL | 1 refills | Status: DC

## 2019-07-06 NOTE — Progress Notes (Signed)
Virtual Visit via Telephone Note  I connected with Debbie Townsend on 07/06/19 at  4:10 PM EDT by telephone and verified that I am speaking with the correct person using two identifiers.   I discussed the limitations, risks, security and privacy concerns of performing an evaluation and management service by telephone and the availability of in person appointments. I also discussed with the patient that there may be a patient responsible charge related to this service. The patient expressed understanding and agreed to proceed.She was at home and I was in my office.  History of Present Illness:   Debbie Townsend is a 33 y.o. who was started on Phentermine approximately 3 months ago due to obesity/abnormal weight gain, and Topamax for Pseudotumor as well as weight loss 2 mos ago. The patient has lost 10 pounds over the past 2 mos due to lifestyle changes, meds, some exercise (too hot)..   She has these side effects: none.  PMHx: She  has a past medical history of Anxiety, BRCA negative, Depression, Family history of anesthesia complication, Foot drop, left foot, GERD (gastroesophageal reflux disease), Headache, History of kidney stones, Hypertension, and Pseudotumor cerebri (2007 or 2009). Also,  has a past surgical history that includes Lumbar laminectomy/decompression microdiscectomy (Left, 09/22/2014); Cesarean section; Wisdom tooth extraction; and Cholecystectomy (N/A, 02/04/2019)., family history includes Alcohol abuse in her father; Arthritis in her father and sister; Breast cancer in her mother; Cancer in her maternal grandmother, maternal uncle, and mother; Depression in her father, mother, and sister; Diabetes in her mother; Drug abuse in her father; Early death in her father; Heart attack in her father; Heart disease in her father; Hypertension in her father, mother, sister, and sister; Hypothyroidism in her mother; Liver disease in her father.,  reports that she has been smoking cigarettes. She has  been smoking about 0.50 packs per day. She has never used smokeless tobacco. She reports that she does not drink alcohol or use drugs.  She has a current medication list which includes the following prescription(s): albuterol, alprazolam, phentermine, sertraline, topiramate, and cholestyramine light. Also, is allergic to apple; carrot [daucus carota]; monistat [miconazole]; other; and amoxicillin.  Review of Systems  All other systems reviewed and are negative.  Observations/Objective: No exam today, due to telephone eVisit due to [facility] virus restriction on elective visits and procedures.  Prior visits reviewed along with ultrasounds/labs as indicated. Ht 5' 6" (1.676 m)   Wt 255 lb (115.7 kg)   LMP 02/15/2019   BMI 41.16 kg/m    -  -  -  From home  Assessment and Plan: 1. Class 3 severe obesity due to excess calories without serious comorbidity with body mass index (BMI) of 40.0 to 44.9 in adult [facility])  Will continue to assist patient in incorporating positive experiences into her life to promote a positive mental attitude.  Education given regarding appropriate lifestyle changes for weight loss, including regular physical activity, healthy coping strategies, caloric restriction, and healthy eating patterns.  The risks and benefits as well as side effects of medication, such as Phenteramine or Tenuate, is discussed.  The pros and cons of suppressing appetite and boosting metabolism is counseled.  Risks of tolerance and addiction discussed.  Use of medicine will be short term.  Pt to call with any negative side effects and agrees to keep follow up appointments.  Cont Phentermine for 2 more mos.  Also on Topamax.   Follow Up Instructions: As needed   I discussed the assessment and  treatment plan with the patient. The patient was provided an opportunity to ask questions and all were answered. The patient agreed with the plan and demonstrated an understanding of the instructions.   The  patient was advised to call back or seek an in-person evaluation if the symptoms worsen or if the condition fails to improve as anticipated.  I provided 12 minutes of non-face-to-face time during this encounter.   Hoyt Koch, MD

## 2019-08-02 ENCOUNTER — Encounter: Payer: Self-pay | Admitting: Internal Medicine

## 2019-08-02 ENCOUNTER — Encounter (INDEPENDENT_AMBULATORY_CARE_PROVIDER_SITE_OTHER): Payer: BLUE CROSS/BLUE SHIELD | Admitting: Internal Medicine

## 2019-08-02 ENCOUNTER — Telehealth: Payer: Self-pay | Admitting: Internal Medicine

## 2019-08-02 DIAGNOSIS — K649 Unspecified hemorrhoids: Secondary | ICD-10-CM

## 2019-08-02 MED ORDER — LIDOCAINE (ANORECTAL) 5 % EX CREA: 1.0000 "application " | TOPICAL_CREAM | Freq: Four times a day (QID) | CUTANEOUS | 2 refills | Status: DC | PRN

## 2019-08-02 MED ORDER — HYDROCORTISONE (PERIANAL) 2.5 % EX CREA: 1.0000 "application " | TOPICAL_CREAM | Freq: Four times a day (QID) | CUTANEOUS | 0 refills | Status: DC

## 2019-08-02 NOTE — Telephone Encounter (Signed)
Call pt and set up telephone or video visit this week   Thanks [place]

## 2019-08-02 NOTE — Telephone Encounter (Signed)
My chart today :) Me to [place], [place]      08/02/19 3:34 PM I am not working today I am working from home on Mondays Happy to do a telephone consult Tuesday to Friday if I have availability,  Do you want to set up a visit?   Or we can address via my chart today?   Last read by Jacklynn Lewis at 4:20 PM on 08/02/2019. [place], [place]    08/02/19 3:29 PM Yes. You may call me, that will prob be easier for me then checking my chart while working. [phone]. Thanks! Me  We are charging for advise or addressing new issues via my chart are you agreeable to file a payment with your insurance?   [place]  Last read by Jacklynn Lewis at 3:29 PM on 08/02/2019.     08/02/19 12:33 PM Hey there. I have been really constipated for about a week now. I take a stool softener every day, regularly but it has stopped working. I used a suppository last Thursday/Friday and it didn't do much. I have developed a hemorrhoid and been using prep H cream and wipes for that. That helps some until I have to go back to bathroom. I drink prune juice yesterday night and it allowed me too pass some stool this AM but not much at all. I also bought some Senokot-S dual action it has stool softener and laxative in it. I haven't it yet. I wanted too see what you thought before I tried anything else? I've never had a hemorrhoid before so this is all new too me and very painful. Thanks!   Time spent 5-10 minutes  Pt agreeable to fee   Hemorrhoids  1. Sitz baths  2. anusol rectal cream and pain relief cream if not improving will refer to GI or surgery  3. Increase water and fiber, warm prune juice ok 3. Senna S laxative ok or Miralax over the counter    [place]

## 2019-08-02 NOTE — Patient Instructions (Signed)

## 2019-08-03 NOTE — Telephone Encounter (Signed)
Patient saw Dr. Aundra Dubin yesterday through a myChart visit.

## 2019-09-10 DIAGNOSIS — G932 Benign intracranial hypertension: Secondary | ICD-10-CM | POA: Diagnosis not present

## 2019-09-17 ENCOUNTER — Encounter: Payer: Self-pay | Admitting: Obstetrics & Gynecology

## 2019-09-23 ENCOUNTER — Other Ambulatory Visit: Payer: Self-pay

## 2019-09-24 ENCOUNTER — Encounter: Payer: Self-pay | Admitting: Internal Medicine

## 2019-09-24 ENCOUNTER — Ambulatory Visit (INDEPENDENT_AMBULATORY_CARE_PROVIDER_SITE_OTHER): Payer: BC Managed Care – PPO | Admitting: Internal Medicine

## 2019-09-24 ENCOUNTER — Other Ambulatory Visit: Payer: Self-pay

## 2019-09-24 VITALS — BP 108/70 | HR 72 | Temp 98.6°F | Ht 66.0 in | Wt 256.2 lb

## 2019-09-24 DIAGNOSIS — Z87442 Personal history of urinary calculi: Secondary | ICD-10-CM

## 2019-09-24 DIAGNOSIS — E538 Deficiency of other specified B group vitamins: Secondary | ICD-10-CM

## 2019-09-24 DIAGNOSIS — R5383 Other fatigue: Secondary | ICD-10-CM | POA: Diagnosis not present

## 2019-09-24 DIAGNOSIS — R739 Hyperglycemia, unspecified: Secondary | ICD-10-CM | POA: Diagnosis not present

## 2019-09-24 DIAGNOSIS — Z1329 Encounter for screening for other suspected endocrine disorder: Secondary | ICD-10-CM

## 2019-09-24 DIAGNOSIS — Z13220 Encounter for screening for lipoid disorders: Secondary | ICD-10-CM

## 2019-09-24 DIAGNOSIS — F419 Anxiety disorder, unspecified: Secondary | ICD-10-CM

## 2019-09-24 DIAGNOSIS — Z0000 Encounter for general adult medical examination without abnormal findings: Secondary | ICD-10-CM | POA: Insufficient documentation

## 2019-09-24 DIAGNOSIS — G932 Benign intracranial hypertension: Secondary | ICD-10-CM

## 2019-09-24 DIAGNOSIS — Z23 Encounter for immunization: Secondary | ICD-10-CM | POA: Diagnosis not present

## 2019-09-24 DIAGNOSIS — G4700 Insomnia, unspecified: Secondary | ICD-10-CM

## 2019-09-24 DIAGNOSIS — R319 Hematuria, unspecified: Secondary | ICD-10-CM | POA: Diagnosis not present

## 2019-09-24 DIAGNOSIS — E559 Vitamin D deficiency, unspecified: Secondary | ICD-10-CM | POA: Diagnosis not present

## 2019-09-24 HISTORY — DX: Personal history of urinary calculi: Z87.442

## 2019-09-24 HISTORY — DX: Encounter for general adult medical examination without abnormal findings: Z00.00

## 2019-09-24 HISTORY — DX: Insomnia, unspecified: G47.00

## 2019-09-24 HISTORY — DX: Other fatigue: R53.83

## 2019-09-24 LAB — COMPREHENSIVE METABOLIC PANEL
ALT: 20 U/L (ref 0–35)
AST: 14 U/L (ref 0–37)
Albumin: 4.2 g/dL (ref 3.5–5.2)
Alkaline Phosphatase: 49 U/L (ref 39–117)
BUN: 13 mg/dL (ref 6–23)
CO2: 27 mEq/L (ref 19–32)
Calcium: 9.4 mg/dL (ref 8.4–10.5)
Chloride: 104 mEq/L (ref 96–112)
Creatinine, Ser: 0.81 mg/dL (ref 0.40–1.20)
GFR: 80.95 mL/min (ref >60.00)
Glucose, Bld: 96 mg/dL (ref 70–99)
Potassium: 4.2 mEq/L (ref 3.5–5.1)
Sodium: 139 mEq/L (ref 135–145)
Total Bilirubin: 0.4 mg/dL (ref 0.2–1.2)
Total Protein: 6.8 g/dL (ref 6.0–8.3)

## 2019-09-24 LAB — CBC WITH DIFFERENTIAL/PLATELET
Basophils Absolute: 0.1 10*3/uL (ref 0.0–0.1)
Basophils Relative: 0.7 % (ref 0.0–3.0)
Eosinophils Absolute: 0.2 10*3/uL (ref 0.0–0.7)
Eosinophils Relative: 1.8 % (ref 0.0–5.0)
HCT: 41 % (ref 36.0–46.0)
Hemoglobin: 13.7 g/dL (ref 12.0–15.0)
Lymphocytes Relative: 19.7 % (ref 12.0–46.0)
Lymphs Abs: 1.7 10*3/uL (ref 0.7–4.0)
MCHC: 33.4 g/dL (ref 30.0–36.0)
MCV: 83.5 fl (ref 78.0–100.0)
Monocytes Absolute: 0.5 10*3/uL (ref 0.1–1.0)
Monocytes Relative: 5.9 % (ref 3.0–12.0)
Neutro Abs: 6.1 10*3/uL (ref 1.4–7.7)
Neutrophils Relative %: 71.9 % (ref 43.0–77.0)
Platelets: 308 10*3/uL (ref 150.0–400.0)
RBC: 4.91 Mil/uL (ref 3.87–5.11)
RDW: 13.8 % (ref 11.5–15.5)
WBC: 8.4 10*3/uL (ref 4.0–10.5)

## 2019-09-24 LAB — URINALYSIS, ROUTINE W REFLEX MICROSCOPIC
Bacteria, UA: NONE SEEN /HPF
Bilirubin Urine: NEGATIVE
Glucose, UA: NEGATIVE
Hgb urine dipstick: NEGATIVE
Hyaline Cast: NONE SEEN /LPF
Ketones, ur: NEGATIVE
Leukocytes,Ua: NEGATIVE
Nitrite: NEGATIVE
Protein, ur: NEGATIVE
Specific Gravity, Urine: 1.021 (ref 1.001–1.03)
pH: 5.5 (ref 5.0–8.0)

## 2019-09-24 LAB — LIPID PANEL
Cholesterol: 140 mg/dL (ref 0–200)
HDL: 40.5 mg/dL (ref >39.00)
LDL Cholesterol: 78 mg/dL (ref 0–99)
NonHDL: 99.36
Total CHOL/HDL Ratio: 3
Triglycerides: 107 mg/dL (ref 0.0–149.0)
VLDL: 21.4 mg/dL (ref 0.0–40.0)

## 2019-09-24 LAB — VITAMIN B12: Vitamin B-12: 293 pg/mL (ref 211–911)

## 2019-09-24 LAB — VITAMIN D 25 HYDROXY (VIT D DEFICIENCY, FRACTURES): VITD: 33.6 ng/mL (ref 30.00–100.00)

## 2019-09-24 LAB — TSH: TSH: 2.72 u[IU]/mL (ref 0.35–4.50)

## 2019-09-24 LAB — HEMOGLOBIN A1C: Hgb A1c MFr Bld: 6 % (ref 4.6–6.5)

## 2019-09-24 MED ORDER — ALPRAZOLAM 0.25 MG PO TABS: 0.2500 mg | ORAL_TABLET | Freq: Every day | ORAL | 0 refills | Status: DC | PRN

## 2019-09-24 NOTE — Progress Notes (Signed)
Chief Complaint  Patient presents with  . Follow-up   Annual doing welll except  1. Pseudotumor cerebri f/u with Dr. Wallace Going and neurology she wants to be back on diamox due to this as helped in the past still getting h/a on topamax 50 mg bid s/p 5-6 LP when she was age 33 y.o  2. C/o fatigue x 1-2 months  3. H/o kidney stones    Review of Systems  Constitutional: Positive for weight loss.  HENT: Negative for hearing loss.   Eyes: Negative for blurred vision.  Respiratory: Negative for shortness of breath.   Cardiovascular: Negative for chest pain.  Gastrointestinal: Negative for abdominal pain.  Musculoskeletal: Negative for falls.  Skin: Negative for itching and rash.  Neurological: Negative for headaches.  Psychiatric/Behavioral: Negative for depression.   Past Medical History:  Diagnosis Date  . Anxiety   . BRCA negative   . Depression   . Family history of anesthesia complication     mother had Post -op nausea and dizziness.  . Foot drop, left foot   . GERD (gastroesophageal reflux disease)   . Headache   . History of kidney stones   . Hypertension   . Kidney stone   . Pseudotumor cerebri 2007 or 2009   Past Surgical History:  Procedure Laterality Date  . CESAREAN SECTION     2009/2017  . CHOLECYSTECTOMY N/A 02/04/2019   Procedure: LAPAROSCOPIC CHOLECYSTECTOMY;  Surgeon: Fredirick Maudlin, MD;  Location: [facility] ORS;  Service: General;  Laterality: N/A;  . LUMBAR LAMINECTOMY/DECOMPRESSION MICRODISCECTOMY Left 09/22/2014   Procedure: Left Lumbar four-five microdiskectomy;  Surgeon: Consuella Lose, MD;  Location: [facility];  Service: Neurosurgery;  Laterality: Left;  Left Lumbar four-five microdiskectomy  . WISDOM TOOTH EXTRACTION     Family History  Problem Relation Age of Onset  . Cancer Mother        breast dx'ed age 55/49  . Hypertension Mother   . Diabetes Mother   . Hypothyroidism Mother   . Depression Mother   . Breast cancer Mother        5s  .  Liver disease Father   . Heart attack Father   . Arthritis Father   . Alcohol abuse Father   . Depression Father   . Drug abuse Father   . Early death Father   . Heart disease Father   . Hypertension Father   . Depression Sister   . Hypertension Sister   . Arthritis Sister   . Hypertension Sister   . Cancer Maternal Grandmother        stomach>liver>brain met  . Cancer Maternal Uncle        lung not a smoker    Social History   Socioeconomic History  . Marital status: Married    Spouse name: Not on file  . Number of children: 2  . Years of education: Not on file  . Highest education level: Not on file  Occupational History  . Not on file  Social Needs  . Financial resource strain: Not on file  . Food insecurity    Worry: Not on file    Inability: Not on file  . Transportation needs    Medical: Not on file    Non-medical: Not on file  Tobacco Use  . Smoking status: Current Every Day Smoker    Packs/day: 0.50    Types: Cigarettes  . Smokeless tobacco: Never Used  Substance and Sexual Activity  . Alcohol use: No  Comment: " occasionally"  . Drug use: No  . Sexual activity: Yes    Birth control/protection: Other-see comments, Rhythm    Comment: withdrawl  Lifestyle  . Physical activity    Days per week: 3 days    Minutes per session: 40 min  . Stress: Rather much  Relationships  . Social Herbalist on phone: Not on file    Gets together: Not on file    Attends religious service: Not on file    Active member of club or organization: Not on file    Attends meetings of clubs or organizations: Not on file    Relationship status: Not on file  . Intimate partner violence    Fear of current or ex partner: Not on file    Emotionally abused: Not on file    Physically abused: Not on file    Forced sexual activity: Not on file  Other Topics Concern  . 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 albuterol (PROVENTIL HFA;VENTOLIN HFA) 108 (90 Base) MCG/ACT inhaler Inhale 1-2 puffs into the lungs every 4 (four) hours as needed for wheezing or shortness of breath.  . ALPRAZolam (XANAX) 0.25 MG tablet Take 1 tablet (0.25 mg total) by mouth daily as needed for anxiety.  . hydrocortisone (ANUSOL-HC) 2.5 % rectal cream Place 1 application rectally 4 (four) times daily.  . Lidocaine, Anorectal, 5 % CREA Apply 1 application topically 4 (four) times daily as needed. For pain in rectum  . phentermine (ADIPEX-P) 37.5 MG tablet One tablet po in morning.  . sertraline (ZOLOFT) 100 MG tablet Take 1 tablet (100 mg total) by mouth daily.  Marland Kitchen topiramate (TOPAMAX) 50 MG tablet Take 50 mg by mouth 2 (two) times daily.   . [DISCONTINUED] ALPRAZolam (XANAX) 0.25 MG tablet Take 1 tablet (0.25 mg total) by mouth daily as needed for anxiety.   Allergies  Allergen Reactions  . Apple Other (See Comments)    Lips itch and swell, inside of mouth gets hives  . Carrot [Daucus Carota] Other (See Comments)    Lips itch and swell, inside of mouth gets hives  . Monistat [Miconazole] Swelling    Severe vaginal itching and swelling  . Other Itching and Swelling    Almonds-mouth swells and itches  . Amoxicillin Hives, Itching, Swelling and Dermatitis    Did it involve swelling of the face/tongue/throat, SOB, or low BP? No Did it involve sudden or severe rash/hives, skin peeling, or any reaction on the inside of your mouth or nose? Yes Did you need to seek medical attention at a hospital or doctor's office? Yes When did it last happen?within the past 5 years If all above answers are "NO", may proceed with cephalosporin use.    No results found for this or any previous visit (from the past 2160 hour(s)). Objective  Body mass index is 41.35 kg/m. Wt Readings from Last 3 Encounters:   09/24/19 256 lb 3.2 oz (116.2 kg)  07/06/19 255 lb (115.7 kg)  05/03/19 265 lb (120.2 kg)   Temp Readings from Last 3 Encounters:  09/24/19 98.6 F (37 C) (Oral)  02/20/19 98 F (36.7 C)  02/15/19 98.1 F (36.7 C) (Skin)  BP Readings from Last 3 Encounters:  09/24/19 108/70  02/20/19 (!) 132/94  02/15/19 118/77   Pulse Readings from Last 3 Encounters:  09/24/19 72  02/20/19 75  02/15/19 87    Physical Exam Vitals signs and nursing note reviewed.  Constitutional:      Appearance: Normal appearance. She is well-developed and well-groomed. She is obese.  HENT:     Head: Normocephalic and atraumatic.  Eyes:     Conjunctiva/sclera: Conjunctivae normal.     Pupils: Pupils are equal, round, and reactive to light.     Comments: Mask on    Cardiovascular:     Rate and Rhythm: Normal rate and regular rhythm.     Heart sounds: Normal heart sounds. No murmur.  Pulmonary:     Effort: Pulmonary effort is normal.     Breath sounds: Normal breath sounds.  Skin:    General: Skin is warm and dry.     Comments: Keloids to back   Neurological:     General: No focal deficit present.     Mental Status: She is alert and oriented to person, place, and time. Mental status is at baseline.     Gait: Gait normal.  Psychiatric:        Attention and Perception: Attention and perception normal.        Mood and Affect: Mood and affect normal.        Speech: Speech normal.        Behavior: Behavior normal. Behavior is cooperative.        Thought Content: Thought content normal.        Cognition and Memory: Cognition and memory normal.        Judgment: Judgment normal.     Assessment  Plan  Annual physical exam -  Fasting labs today  Declines flu shot Tdap had 01/12/16  rectwinrixvaccine with fatty liver hx1/3 given today    HIV 01/04/16 neg  -had pap 04/23/18 neg pap neg HPV -ob/ygn Dr. Kenton Kingfisher   Hematuria, unspecified type with h/o kidney stones - Plan: Urinalysis, Routine w  reflex microscopic  Fatigue, unspecified type - Plan: Comprehensive metabolic panel, CBC w/Diff, TSH  Anxiety/insomina - Plan: ALPRAZolam (XANAX) 0.25 MG tablet Disc tranquil sleep  Pseudotumor cerebri F/u Dr. Wallace Going and Dr. Manuella Ghazi CC Dr. Manuella Ghazi pt wants to be back on diamox and needs f/u   Provider: Dr. [name] McLean-Scocuz[facility]

## 2019-09-24 NOTE — Patient Instructions (Addendum)
The next 56 days nutrition program   Stress Relax brand L theanine but Tranquil sleep version    Hepatitis A; Hepatitis B Vaccine injection What is this medicine? HEPATITIS A VACCINE; HEPATITIS B VACCINE (hep uh TAHY tis A vak SEEN; hep uh TAHY tis B vak SEEN) is a vaccine to protect from an infection with the hepatitis A and B virus. This vaccine does not contain the live viruses. It will not cause a hepatitis infection. This medicine may be used for other purposes; ask your health care provider or pharmacist if you have questions. COMMON BRAND NAME(S): Twinrix What should I tell my health care provider before I take this medicine? They need to know if you have any of these conditions:  bleeding disorder  fever or infection  heart disease  immune system problems  an unusual or allergic reaction to hepatitis A or B vaccine, neomycin, yeast, thimerosal, other medicines, foods, dyes, or preservatives  pregnant or trying to get pregnant  breast-feeding How should I use this medicine? This vaccine is for injection into a muscle. It is given by a health care professional. A copy of Vaccine Information Statements will be given before each vaccination. Read this sheet carefully each time. The sheet may change frequently. Talk to your pediatrician regarding the use of this medicine in children. Special care may be needed. Overdosage: If you think you have taken too much of this medicine contact a poison control center or emergency room at once. NOTE: This medicine is only for you. Do not share this medicine with others. What if I miss a dose? It is important not to miss your dose. Call your doctor or health care professional if you are unable to keep an appointment. What may interact with this medicine?  medicines that suppress your immune function like adalimumab, anakinra, infliximab  medicines to treat cancer  steroid medicines like prednisone or cortisone This list may not  describe all possible interactions. Give your health care provider a list of all the medicines, herbs, non-prescription drugs, or dietary supplements you use. Also tell them if you smoke, drink alcohol, or use illegal drugs. Some items may interact with your medicine. What should I watch for while using this medicine? See your health care provider for all shots of this vaccine as directed. You must have 3 to 4 shots of this vaccine for protection from hepatitis A and B infection. Tell your doctor right away if you have any serious or unusual side effects after getting this vaccine. What side effects may I notice from receiving this medicine? Side effects that you should report to your doctor or health care professional as soon as possible:  allergic reactions like skin rash, itching or hives, swelling of the face, lips, or tongue  breathing problems  confused, irritated  fast, irregular heartbeat  flu-like syndrome  numb, tingling pain  seizures Side effects that usually do not require medical attention (report to your doctor or health care professional if they continue or are bothersome):  diarrhea  fever  headache  loss of appetite  muscle pain  nausea  pain, redness, swelling, or irritation at site where injected  tiredness This list may not describe all possible side effects. Call your doctor for medical advice about side effects. You may report side effects to FDA at 1-800-FDA-1088. Where should I keep my medicine? This drug is given in a hospital or clinic and will not be stored at home. NOTE: This sheet is a summary. It  may not cover all possible information. If you have questions about this medicine, talk to your doctor, pharmacist, or health care provider.  2020 Elsevier/Gold Standard (2008-04-15 15:21:37)  Low Back Sprain or Strain Rehab Ask your health care provider which exercises are safe for you. Do exercises exactly as told by your health care provider and  adjust them as directed. It is normal to feel mild stretching, pulling, tightness, or discomfort as you do these exercises. Stop right away if you feel sudden pain or your pain gets worse. Do not begin these exercises until told by your health care provider. Stretching and range-of-motion exercises These exercises warm up your muscles and joints and improve the movement and flexibility of your back. These exercises also help to relieve pain, numbness, and tingling. Lumbar rotation  1. Lie on your back on a firm surface and bend your knees. 2. Straighten your arms out to your sides so each arm forms a 90-degree angle (right angle) with a side of your body. 3. Slowly move (rotate) both of your knees to one side of your body until you feel a stretch in your lower back (lumbar). Try not to let your shoulders lift off the floor. 4. Hold this position for __________ seconds. 5. Tense your abdominal muscles and slowly move your knees back to the starting position. 6. Repeat this exercise on the other side of your body. Repeat __________ times. Complete this exercise __________ times a day. Single knee to chest  1. Lie on your back on a firm surface with both legs straight. 2. Bend one of your knees. Use your hands to move your knee up toward your chest until you feel a gentle stretch in your lower back and buttock. ? Hold your leg in this position by holding on to the front of your knee. ? Keep your other leg as straight as possible. 3. Hold this position for __________ seconds. 4. Slowly return to the starting position. 5. Repeat with your other leg. Repeat __________ times. Complete this exercise __________ times a day. Prone extension on elbows  1. Lie on your abdomen on a firm surface (prone position). 2. Prop yourself up on your elbows. 3. Use your arms to help lift your chest up until you feel a gentle stretch in your abdomen and your lower back. ? This will place some of your body weight  on your elbows. If this is uncomfortable, try stacking pillows under your chest. ? Your hips should stay down, against the surface that you are lying on. Keep your hip and back muscles relaxed. 4. Hold this position for __________ seconds. 5. Slowly relax your upper body and return to the starting position. Repeat __________ times. Complete this exercise __________ times a day. Strengthening exercises These exercises build strength and endurance in your back. Endurance is the ability to use your muscles for a long time, even after they get tired. Pelvic tilt This exercise strengthens the muscles that lie deep in the abdomen. 1. Lie on your back on a firm surface. Bend your knees and keep your feet flat on the floor. 2. Tense your abdominal muscles. Tip your pelvis up toward the ceiling and flatten your lower back into the floor. ? To help with this exercise, you may place a small towel under your lower back and try to push your back into the towel. 3. Hold this position for __________ seconds. 4. Let your muscles relax completely before you repeat this exercise. Repeat __________ times. Complete this  exercise __________ times a day. Alternating arm and leg raises  1. Get on your hands and knees on a firm surface. If you are on a hard floor, you may want to use padding, such as an exercise mat, to cushion your knees. 2. Line up your arms and legs. Your hands should be directly below your shoulders, and your knees should be directly below your hips. 3. Lift your left leg behind you. At the same time, raise your right arm and straighten it in front of you. ? Do not lift your leg higher than your hip. ? Do not lift your arm higher than your shoulder. ? Keep your abdominal and back muscles tight. ? Keep your hips facing the ground. ? Do not arch your back. ? Keep your balance carefully, and do not hold your breath. 4. Hold this position for __________ seconds. 5. Slowly return to the starting  position. 6. Repeat with your right leg and your left arm. Repeat __________ times. Complete this exercise __________ times a day. Abdominal set with straight leg raise  1. Lie on your back on a firm surface. 2. Bend one of your knees and keep your other leg straight. 3. Tense your abdominal muscles and lift your straight leg up, 4-6 inches (10-15 cm) off the ground. 4. Keep your abdominal muscles tight and hold this position for __________ seconds. ? Do not hold your breath. ? Do not arch your back. Keep it flat against the ground. 5. Keep your abdominal muscles tense as you slowly lower your leg back to the starting position. 6. Repeat with your other leg. Repeat __________ times. Complete this exercise __________ times a day. Single leg lower with bent knees 1. Lie on your back on a firm surface. 2. Tense your abdominal muscles and lift your feet off the floor, one foot at a time, so your knees and hips are bent in 90-degree angles (right angles). ? Your knees should be over your hips and your lower legs should be parallel to the floor. 3. Keeping your abdominal muscles tense and your knee bent, slowly lower one of your legs so your toe touches the ground. 4. Lift your leg back up to return to the starting position. ? Do not hold your breath. ? Do not let your back arch. Keep your back flat against the ground. 5. Repeat with your other leg. Repeat __________ times. Complete this exercise __________ times a day. Posture and body mechanics Good posture and healthy body mechanics can help to relieve stress in your body's tissues and joints. Body mechanics refers to the movements and positions of your body while you do your daily activities. Posture is part of body mechanics. Good posture means:  Your spine is in its natural S-curve position (neutral).  Your shoulders are pulled back slightly.  Your head is not tipped forward. Follow these guidelines to improve your posture and body  mechanics in your everyday activities. Standing   When standing, keep your spine neutral and your feet about hip width apart. Keep a slight bend in your knees. Your ears, shoulders, and hips should line up.  When you do a task in which you stand in one place for a long time, place one foot up on a stable object that is 2-4 inches (5-10 cm) high, such as a footstool. This helps keep your spine neutral. Sitting   When sitting, keep your spine neutral and keep your feet flat on the floor. Use a footrest, if necessary,  and keep your thighs parallel to the floor. Avoid rounding your shoulders, and avoid tilting your head forward.  When working at a desk or a computer, keep your desk at a height where your hands are slightly lower than your elbows. Slide your chair under your desk so you are close enough to maintain good posture.  When working at a computer, place your monitor at a height where you are looking straight ahead and you do not have to tilt your head forward or downward to look at the screen. Resting  When lying down and resting, avoid positions that are most painful for you.  If you have pain with activities such as sitting, bending, stooping, or squatting, lie in a position in which your body does not bend very much. For example, avoid curling up on your side with your arms and knees near your chest (fetal position).  If you have pain with activities such as standing for a long time or reaching with your arms, lie with your spine in a neutral position and bend your knees slightly. Try the following positions: ? Lying on your side with a pillow between your knees. ? Lying on your back with a pillow under your knees. Lifting   When lifting objects, keep your feet at least shoulder width apart and tighten your abdominal muscles.  Bend your knees and hips and keep your spine neutral. It is important to lift using the strength of your legs, not your back. Do not lock your knees  straight out.  Always ask for help to lift heavy or awkward objects. This information is not intended to replace advice given to you by your health care provider. Make sure you discuss any questions you have with your health care provider. Document Revised: 03/26/2019 Document Reviewed: 12/24/2018 Elsevier Patient Education  [place].  Back Exercises The following exercises strengthen the muscles that help to support the trunk and back. They also help to keep the lower back flexible. Doing these exercises can help to prevent back pain or lessen existing pain.  If you have back pain or discomfort, try doing these exercises 2-3 times each day or as told by your health care provider.  As your pain improves, do them once each day, but increase the number of times that you repeat the steps for each exercise (do more repetitions).  To prevent the recurrence of back pain, continue to do these exercises once each day or as told by your health care provider. Do exercises exactly as told by your health care provider and adjust them as directed. It is normal to feel mild stretching, pulling, tightness, or discomfort as you do these exercises, but you should stop right away if you feel sudden pain or your pain gets worse. Exercises Single knee to chest Repeat these steps 3-5 times for each leg: 1. Lie on your back on a firm bed or the floor with your legs extended. 2. Bring one knee to your chest. Your other leg should stay extended and in contact with the floor. 3. Hold your knee in place by grabbing your knee or thigh with both hands and hold. 4. Pull on your knee until you feel a gentle stretch in your lower back or buttocks. 5. Hold the stretch for 10-30 seconds. 6. Slowly release and straighten your leg. Pelvic tilt Repeat these steps 5-10 times: 1. Lie on your back on a firm bed or the floor with your legs extended. 2. Bend your knees so  they are pointing toward the ceiling and your  feet are flat on the floor. 3. Tighten your lower abdominal muscles to press your lower back against the floor. This motion will tilt your pelvis so your tailbone points up toward the ceiling instead of pointing to your feet or the floor. 4. With gentle tension and even breathing, hold this position for 5-10 seconds. Cat-cow Repeat these steps until your lower back becomes more flexible: 1. Get into a hands-and-knees position on a firm surface. Keep your hands under your shoulders, and keep your knees under your hips. You may place padding under your knees for comfort. 2. Let your head hang down toward your chest. Contract your abdominal muscles and point your tailbone toward the floor so your lower back becomes rounded like the back of a cat. 3. Hold this position for 5 seconds. 4. Slowly lift your head, let your abdominal muscles relax and point your tailbone up toward the ceiling so your back forms a sagging arch like the back of a cow. 5. Hold this position for 5 seconds.  Press-ups Repeat these steps 5-10 times: 1. Lie on your abdomen (face-down) on the floor. 2. Place your palms near your head, about shoulder-width apart. 3. Keeping your back as relaxed as possible and keeping your hips on the floor, slowly straighten your arms to raise the top half of your body and lift your shoulders. Do not use your back muscles to raise your upper torso. You may adjust the placement of your hands to make yourself more comfortable. 4. Hold this position for 5 seconds while you keep your back relaxed. 5. Slowly return to lying flat on the floor.  Bridges Repeat these steps 10 times: 1. Lie on your back on a firm surface. 2. Bend your knees so they are pointing toward the ceiling and your feet are flat on the floor. Your arms should be flat at your sides, next to your body. 3. Tighten your buttocks muscles and lift your buttocks off the floor until your waist is at almost the same height as your knees.  You should feel the muscles working in your buttocks and the back of your thighs. If you do not feel these muscles, slide your feet 1-2 inches farther away from your buttocks. 4. Hold this position for 3-5 seconds. 5. Slowly lower your hips to the starting position, and allow your buttocks muscles to relax completely. If this exercise is too easy, try doing it with your arms crossed over your chest. Abdominal crunches Repeat these steps 5-10 times: 1. Lie on your back on a firm bed or the floor with your legs extended. 2. Bend your knees so they are pointing toward the ceiling and your feet are flat on the floor. 3. Cross your arms over your chest. 4. Tip your chin slightly toward your chest without bending your neck. 5. Tighten your abdominal muscles and slowly raise your trunk (torso) high enough to lift your shoulder blades a tiny bit off the floor. Avoid raising your torso higher than that because it can put too much stress on your low back and does not help to strengthen your abdominal muscles. 6. Slowly return to your starting position. Back lifts Repeat these steps 5-10 times: 1. Lie on your abdomen (face-down) with your arms at your sides, and rest your forehead on the floor. 2. Tighten the muscles in your legs and your buttocks. 3. Slowly lift your chest off the floor while you keep your hips  pressed to the floor. Keep the back of your head in line with the curve in your back. Your eyes should be looking at the floor. 4. Hold this position for 3-5 seconds. 5. Slowly return to your starting position. Contact a health care provider if:  Your back pain or discomfort gets much worse when you do an exercise.  Your worsening back pain or discomfort does not lessen within 2 hours after you exercise. If you have any of these problems, stop doing these exercises right away. Do not do them again unless your health care provider says that you can. Get help right away if:  You develop sudden,  severe back pain. If this happens, stop doing the exercises right away. Do not do them again unless your health care provider says that you can. This information is not intended to replace advice given to you by your health care provider. Make sure you discuss any questions you have with your health care provider. Document Revised: 04/08/2019 Document Reviewed: 09/03/2018 Elsevier Patient Education  [place].

## 2019-10-05 ENCOUNTER — Encounter: Payer: Self-pay | Admitting: Obstetrics & Gynecology

## 2019-10-05 DIAGNOSIS — G43019 Migraine without aura, intractable, without status migrainosus: Secondary | ICD-10-CM | POA: Diagnosis not present

## 2019-10-05 NOTE — Telephone Encounter (Signed)
Sch tele f/u appt where theres an opening

## 2019-10-06 NOTE — Telephone Encounter (Signed)
Called and left voice mail for patient to call back to be schedule °

## 2019-10-06 NOTE — Telephone Encounter (Signed)
Patient is schedule for 10/19/19 with [facility]. Added to wait list for cancelation

## 2019-10-18 ENCOUNTER — Other Ambulatory Visit: Payer: Self-pay | Admitting: Internal Medicine

## 2019-10-18 DIAGNOSIS — Z1231 Encounter for screening mammogram for malignant neoplasm of breast: Secondary | ICD-10-CM

## 2019-10-19 ENCOUNTER — Other Ambulatory Visit: Payer: Self-pay

## 2019-10-19 ENCOUNTER — Encounter: Payer: Self-pay | Admitting: Obstetrics & Gynecology

## 2019-10-19 ENCOUNTER — Ambulatory Visit (INDEPENDENT_AMBULATORY_CARE_PROVIDER_SITE_OTHER): Payer: BC Managed Care – PPO | Admitting: Obstetrics & Gynecology

## 2019-10-19 VITALS — Ht 66.0 in | Wt 255.0 lb

## 2019-10-19 DIAGNOSIS — Z6841 Body Mass Index (BMI) 40.0 and over, adult: Secondary | ICD-10-CM

## 2019-10-19 DIAGNOSIS — N644 Mastodynia: Secondary | ICD-10-CM | POA: Diagnosis not present

## 2019-10-19 DIAGNOSIS — G932 Benign intracranial hypertension: Secondary | ICD-10-CM

## 2019-10-19 MED ORDER — PHENTERMINE HCL 37.5 MG PO TABS: ORAL_TABLET | ORAL | 1 refills | Status: DC

## 2019-10-19 NOTE — Progress Notes (Signed)
Virtual Visit via Telephone Note  I connected with Debbie Townsend on 10/19/19 at 11:00 AM EST by telephone and verified that I am speaking with the correct person using two identifiers.   I discussed the limitations, risks, security and privacy concerns of performing an evaluation and management service by telephone and the availability of in person appointments. I also discussed with the patient that there may be a patient responsible charge related to this service. The patient expressed understanding and agreed to proceed. She was at home and I was in my office.  History of Present Illness: Debbie Townsend is a 33 y.o. who has tried Alli for weight loss w minimal help, min SE.  SHe has been on Phentermine in past w some success.  Recently saw neurology who recommend weight loss to aide w pseudotumor cerebri maintenance and prevention of future complications w that.  She is on Topamax for that.  Diet no help at this time.  She weighs the same today as she dis in July.  Has been counseled as to surgical options but wishes to avoid.  PMHx: She  has a past medical history of Anxiety, BRCA negative, Depression, Family history of anesthesia complication, Foot drop, left foot, GERD (gastroesophageal reflux disease), Headache, History of kidney stones, Hypertension, Kidney stone, and Pseudotumor cerebri (2007 or 2009). Also,  has a past surgical history that includes Lumbar laminectomy/decompression microdiscectomy (Left, 09/22/2014); Cesarean section; Wisdom tooth extraction; and Cholecystectomy (N/A, 02/04/2019)., family history includes Alcohol abuse in her father; Arthritis in her father and sister; Breast cancer in her mother; Cancer in her maternal grandmother, maternal uncle, and mother; Depression in her father, mother, and sister; Diabetes in her mother; Drug abuse in her father; Early death in her father; Heart attack in her father; Heart disease in her father; Hypertension in her father, mother,  sister, and sister; Hypothyroidism in her mother; Liver disease in her father.,  reports that she has been smoking cigarettes. She has been smoking about 0.50 packs per day. She has never used smokeless tobacco. She reports that she does not drink alcohol or use drugs.  She has a current medication list which includes the following prescription(s): albuterol, alprazolam, hydrocortisone, lidocaine (anorectal), phentermine, sertraline, topiramate, and cholestyramine light. Also, is allergic to apple; carrot [daucus carota]; monistat [miconazole]; other; and amoxicillin.  Review of Systems  All other systems reviewed and are negative.    Observations/Objective: No exam today, due to telephone eVisit due to [facility] virus restriction on elective visits and procedures.  Prior visits reviewed along with ultrasounds/labs as indicated. Reported weight: Ht _0  (1.676 m)   Wt 255 lb (115.7 kg)   LMP 02/15/2019   BMI 41.16 kg/m   Assessment and Plan:   ICD-10-CM   1. Class 3 severe obesity due to excess calories without serious comorbidity with body mass index (BMI) of 40.0 to 44.9 in adult ([place])  E66.01    Z68.41   2. Mastalgia in female  N64.4   3. Pseudotumor cerebri  G93.2    Will continue to assist patient in incorporating positive experiences into her life to promote a positive mental attitude.  Education given regarding appropriate lifestyle changes for weight loss, including regular physical activity, healthy coping strategies, caloric restriction, and healthy eating patterns.  The risks and benefits as well as side effects of medication, such as Phenteramine or Tenuate, is discussed.  The pros and cons of suppressing appetite and boosting metabolism is counseled.  Risks of tolerance  and addiction discussed.  Use of medicine will be short term.  Pt to call with any negative side effects and agrees to keep follow up appointments. Rx resarted for Phentermine Neurology approved meds and tx  goals  Follow Up Instructions: 2 mos   I discussed the assessment and treatment plan with the patient. The patient was provided an opportunity to ask questions and all were answered. The patient agreed with the plan and demonstrated an understanding of the instructions.   The patient was advised to call back or seek an in-person evaluation if the symptoms worsen or if the condition fails to improve as anticipated.  I provided 16 minutes of non-face-to-face time during this encounter.   Hoyt Koch, MD

## 2019-10-20 ENCOUNTER — Ambulatory Visit: Payer: BLUE CROSS/BLUE SHIELD | Admitting: Internal Medicine

## 2019-10-21 ENCOUNTER — Telehealth: Payer: Self-pay | Admitting: Internal Medicine

## 2019-10-21 ENCOUNTER — Encounter: Payer: Self-pay | Admitting: Internal Medicine

## 2019-10-21 NOTE — Telephone Encounter (Signed)
Patient states she would like to discuss her neuro dx, patient did not elaborate and requesting to speak with the nurse directly, please advise

## 2019-10-22 ENCOUNTER — Other Ambulatory Visit: Payer: Self-pay

## 2019-10-22 ENCOUNTER — Ambulatory Visit
Admission: RE | Admit: 2019-10-22 | Discharge: 2019-10-22 | Disposition: A | Discharge status: Home / Self Care | Payer: BC Managed Care – PPO | Source: Ambulatory Visit | Attending: Internal Medicine | Admitting: Internal Medicine

## 2019-10-22 DIAGNOSIS — Z1231 Encounter for screening mammogram for malignant neoplasm of breast: Secondary | ICD-10-CM

## 2019-10-22 NOTE — Telephone Encounter (Signed)
Patient sent mychart this morning

## 2019-10-25 ENCOUNTER — Ambulatory Visit: Payer: BC Managed Care – PPO

## 2019-10-26 ENCOUNTER — Ambulatory Visit: Payer: BC Managed Care – PPO

## 2019-10-27 ENCOUNTER — Ambulatory Visit (INDEPENDENT_AMBULATORY_CARE_PROVIDER_SITE_OTHER): Payer: BC Managed Care – PPO

## 2019-10-27 ENCOUNTER — Other Ambulatory Visit: Payer: Self-pay | Admitting: Internal Medicine

## 2019-10-27 ENCOUNTER — Other Ambulatory Visit: Payer: Self-pay

## 2019-10-27 DIAGNOSIS — Z23 Encounter for immunization: Secondary | ICD-10-CM | POA: Diagnosis not present

## 2019-10-27 DIAGNOSIS — G932 Benign intracranial hypertension: Secondary | ICD-10-CM

## 2019-10-28 ENCOUNTER — Other Ambulatory Visit: Payer: Self-pay | Admitting: Internal Medicine

## 2019-10-28 DIAGNOSIS — I10 Essential (primary) hypertension: Secondary | ICD-10-CM

## 2019-10-28 MED ORDER — LISINOPRIL 20 MG PO TABS: 20.0000 mg | ORAL_TABLET | Freq: Every day | ORAL | 3 refills | Status: DC

## 2019-11-05 DIAGNOSIS — G932 Benign intracranial hypertension: Secondary | ICD-10-CM | POA: Diagnosis not present

## 2019-11-05 DIAGNOSIS — H401133 Primary open-angle glaucoma, bilateral, severe stage: Secondary | ICD-10-CM | POA: Diagnosis not present

## 2019-11-09 ENCOUNTER — Encounter: Payer: Self-pay | Admitting: Obstetrics & Gynecology

## 2019-11-09 IMAGING — US US PELVIS COMPLETE
1 series · 14 of 22 positions shown · non-contrast
Comparison: None.

CLINICAL DATA: Vaginal bleeding post hysterectomy on 02/04/2019

EXAM:
TRANSABDOMINAL ULTRASOUND OF PELVIS
TECHNIQUE: Transabdominal ultrasound examination of the pelvis was performed
including evaluation of the uterus, ovaries, adnexal regions, and
pelvic cul-de-sac.

[Series 1: us pelvis complete · 22 acquisitions, 14 frames shown]
[im 1/22]
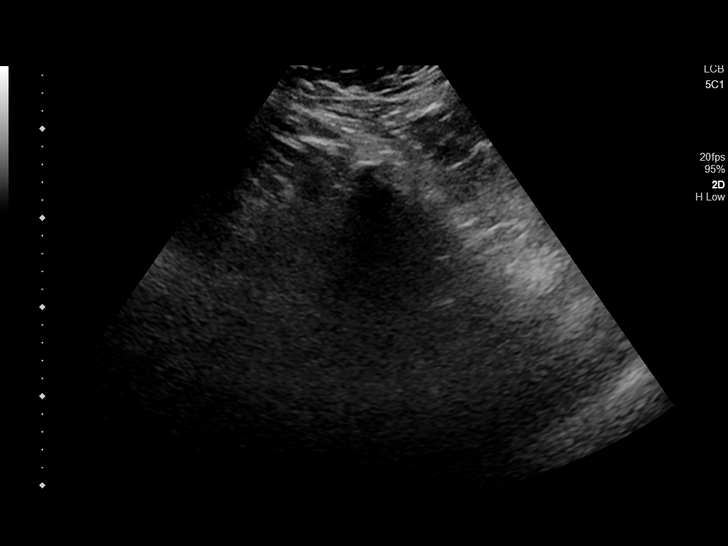
[im 3/22]
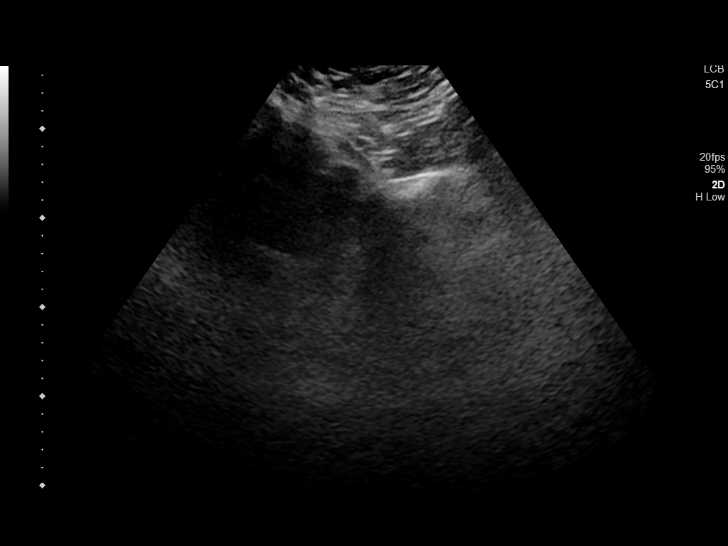
[im 4/22]
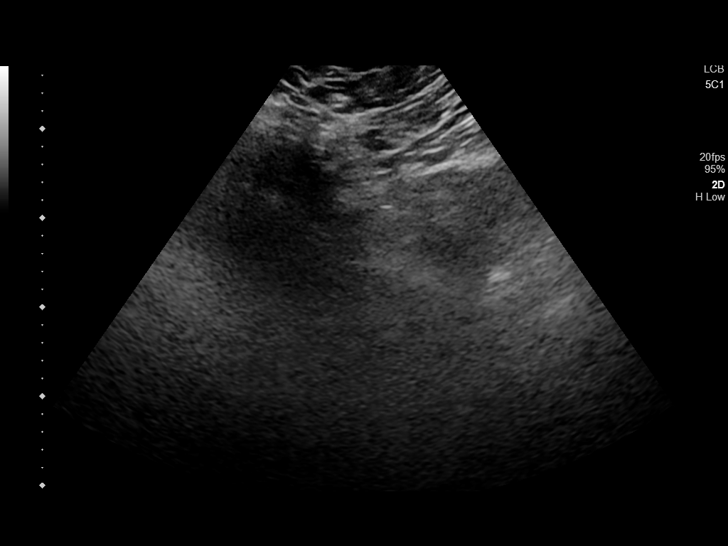
[im 6/22]
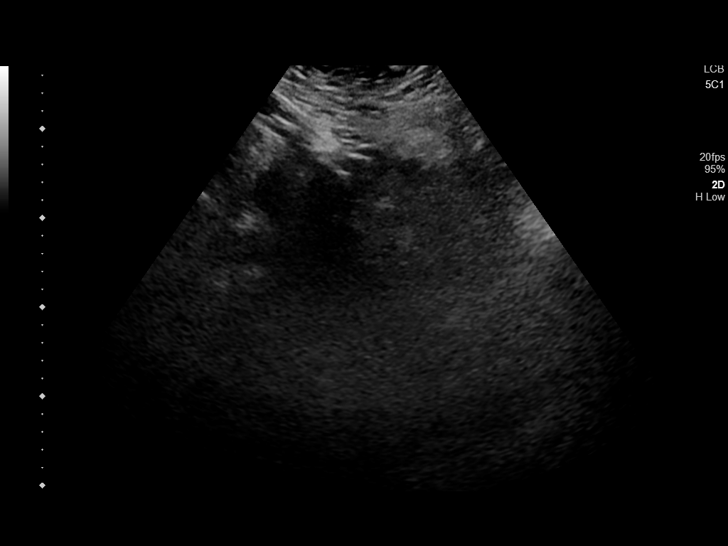
[im 8/22]
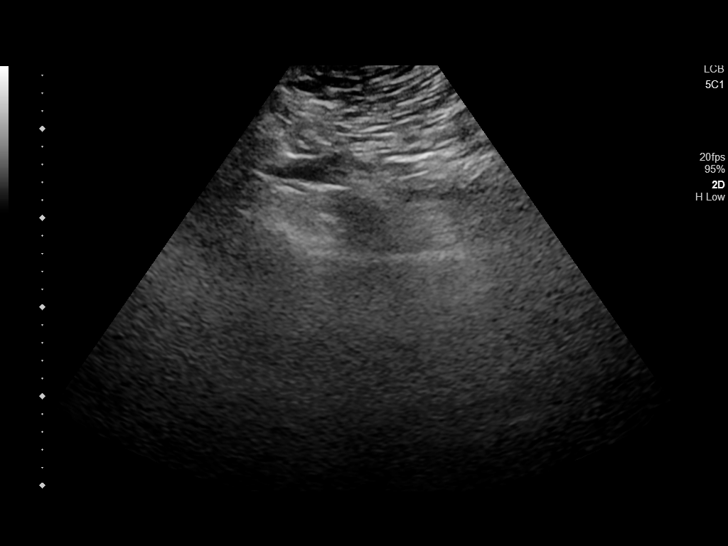
[im 9/22]
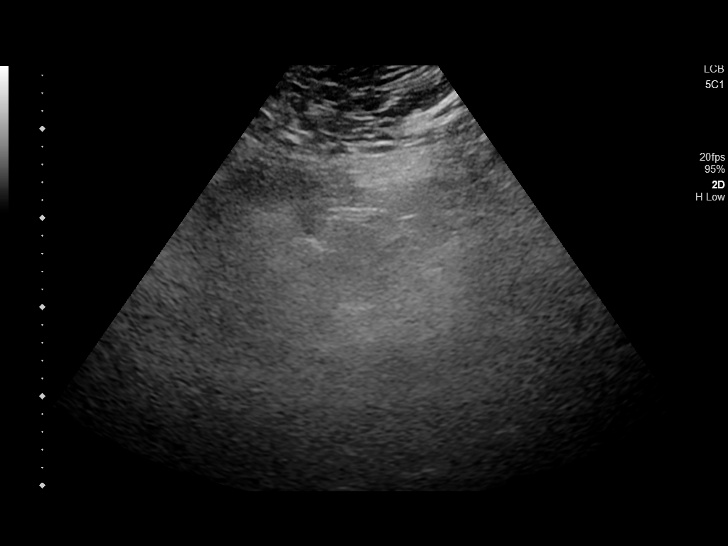
[im 11/22]
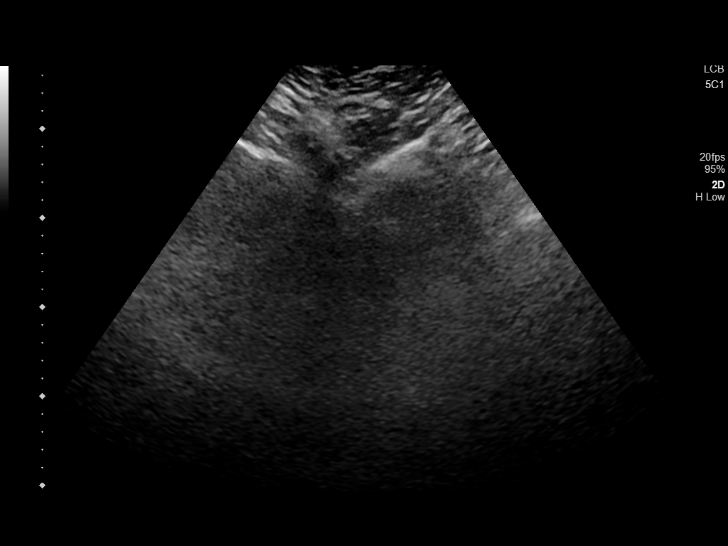
[im 12/22]
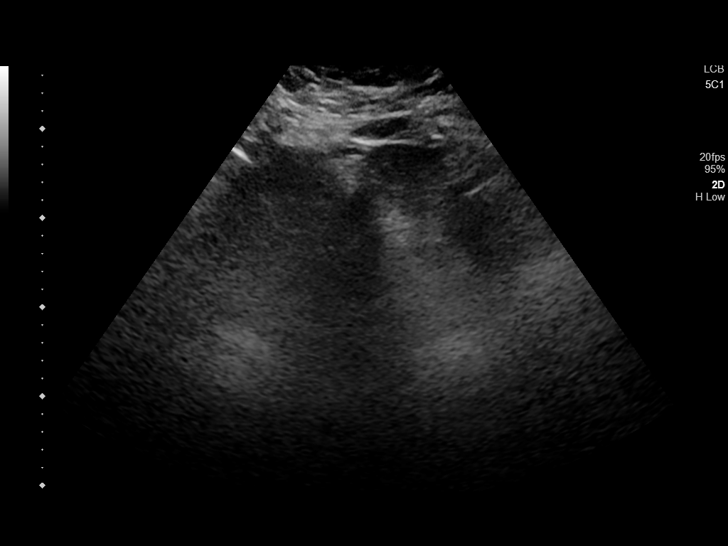
[im 14/22]
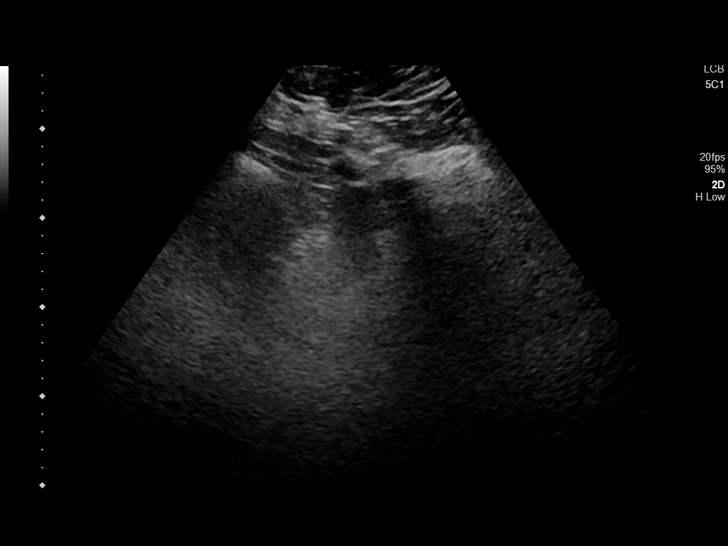
[im 15/22]
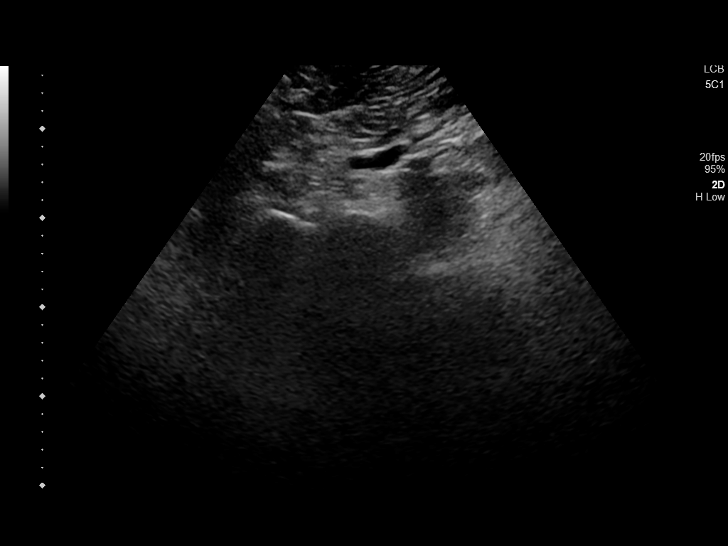
[im 17/22]
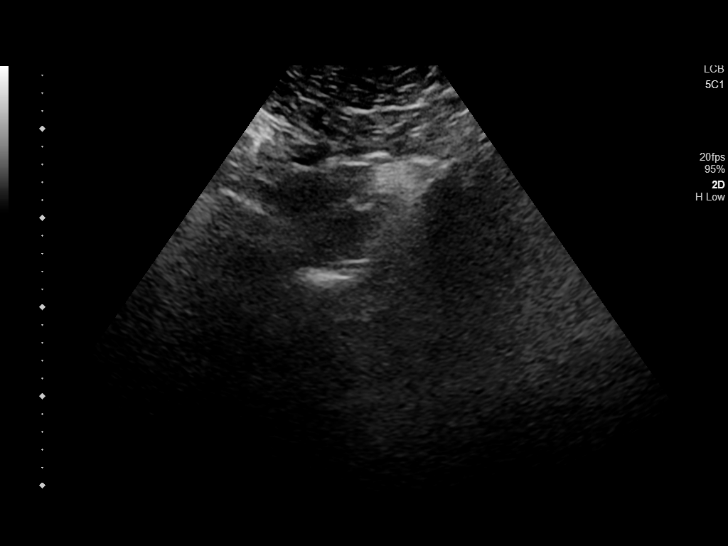
[im 19/22]
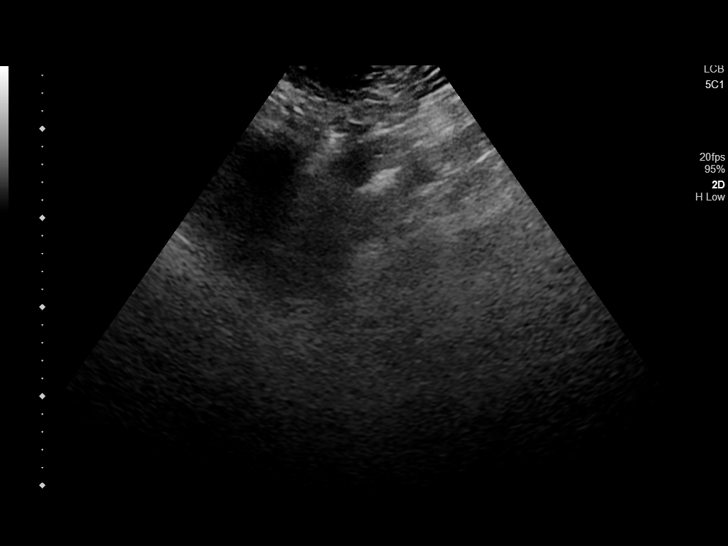
[im 20/22]
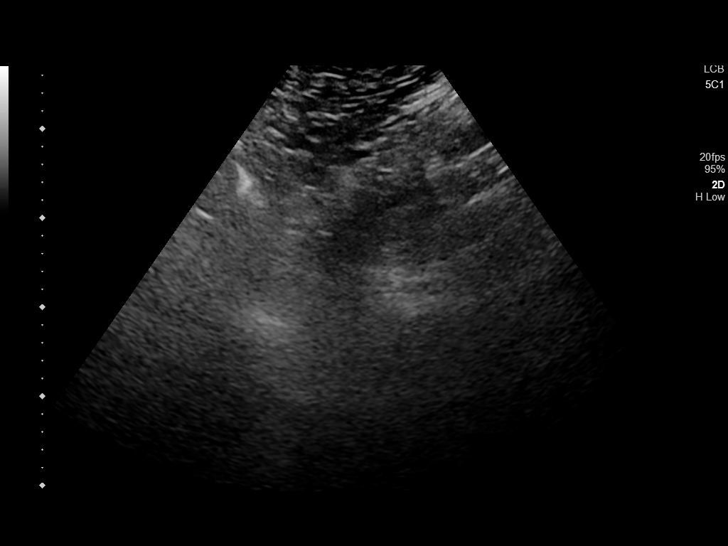
[im 22/22]
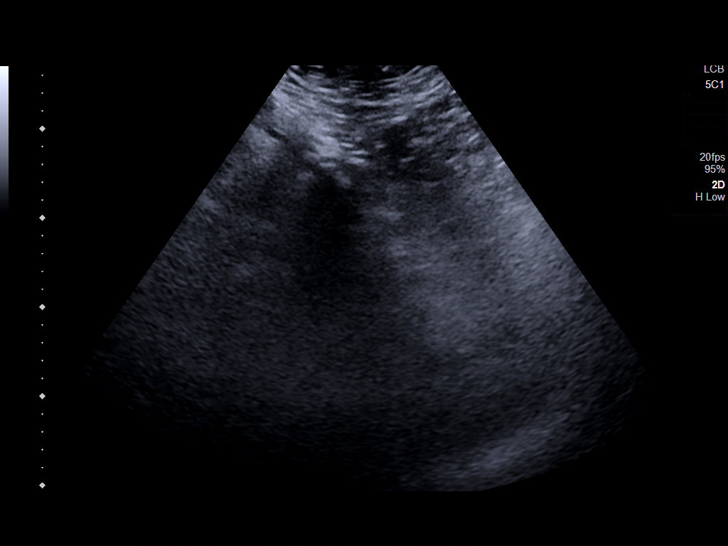

[14 of 22 positions shown; findings below may reference images not displayed]

FINDINGS: Uterus

Surgically absent.

Endometrium

Not applicable

Right ovary

Not visualized

Left ovary

Not visualized

Other findings:  No abnormal free fluid.
IMPRESSION: Status post hysterectomy. No adnexal mass. Neither ovary was
visualized. There is no free fluid or fluid collections identified
at site of hysterectomy.

## 2019-11-10 ENCOUNTER — Other Ambulatory Visit: Payer: Self-pay | Admitting: Internal Medicine

## 2019-11-10 ENCOUNTER — Encounter: Payer: Self-pay | Admitting: Internal Medicine

## 2019-11-10 ENCOUNTER — Other Ambulatory Visit: Payer: Self-pay | Admitting: Family Medicine

## 2019-11-10 DIAGNOSIS — N644 Mastodynia: Secondary | ICD-10-CM

## 2019-11-16 ENCOUNTER — Ambulatory Visit
Admission: RE | Admit: 2019-11-16 | Discharge: 2019-11-16 | Disposition: A | Discharge status: Home / Self Care | Payer: BC Managed Care – PPO | Source: Ambulatory Visit | Attending: Internal Medicine | Admitting: Internal Medicine

## 2019-11-16 DIAGNOSIS — N644 Mastodynia: Secondary | ICD-10-CM | POA: Diagnosis not present

## 2019-12-02 DIAGNOSIS — G932 Benign intracranial hypertension: Secondary | ICD-10-CM | POA: Diagnosis not present

## 2019-12-07 ENCOUNTER — Other Ambulatory Visit: Payer: Self-pay | Admitting: Neurology

## 2019-12-07 DIAGNOSIS — G932 Benign intracranial hypertension: Secondary | ICD-10-CM | POA: Diagnosis not present

## 2019-12-07 DIAGNOSIS — G9601 Cranial cerebrospinal fluid leak, spontaneous: Secondary | ICD-10-CM | POA: Insufficient documentation

## 2019-12-07 DIAGNOSIS — H47293 Other optic atrophy, bilateral: Secondary | ICD-10-CM | POA: Insufficient documentation

## 2019-12-07 HISTORY — DX: Cranial cerebrospinal fluid leak, spontaneous: G96.01

## 2019-12-10 ENCOUNTER — Encounter: Payer: Self-pay | Admitting: Emergency Medicine

## 2019-12-10 ENCOUNTER — Emergency Department
Admission: EM | Admit: 2019-12-10 | Discharge: 2019-12-10 | Disposition: A | Discharge status: Home / Self Care | Payer: BC Managed Care – PPO | Attending: Emergency Medicine | Admitting: Emergency Medicine

## 2019-12-10 ENCOUNTER — Other Ambulatory Visit: Payer: Self-pay

## 2019-12-10 DIAGNOSIS — S61012A Laceration without foreign body of left thumb without damage to nail, initial encounter: Secondary | ICD-10-CM | POA: Diagnosis not present

## 2019-12-10 DIAGNOSIS — Y999 Unspecified external cause status: Secondary | ICD-10-CM | POA: Diagnosis not present

## 2019-12-10 DIAGNOSIS — Y929 Unspecified place or not applicable: Secondary | ICD-10-CM | POA: Insufficient documentation

## 2019-12-10 DIAGNOSIS — Z79899 Other long term (current) drug therapy: Secondary | ICD-10-CM | POA: Insufficient documentation

## 2019-12-10 DIAGNOSIS — W25XXXA Contact with sharp glass, initial encounter: Secondary | ICD-10-CM | POA: Diagnosis not present

## 2019-12-10 DIAGNOSIS — I10 Essential (primary) hypertension: Secondary | ICD-10-CM | POA: Insufficient documentation

## 2019-12-10 DIAGNOSIS — Y939 Activity, unspecified: Secondary | ICD-10-CM | POA: Diagnosis not present

## 2019-12-10 DIAGNOSIS — F17210 Nicotine dependence, cigarettes, uncomplicated: Secondary | ICD-10-CM | POA: Diagnosis not present

## 2019-12-10 MED ORDER — LIDOCAINE HCL (PF) 1 % IJ SOLN
5.0000 mL | Freq: Once | INTRAMUSCULAR | Status: AC
  Administered 2019-12-10: 5 mL
  Filled 2019-12-10: qty 5

## 2019-12-10 MED ORDER — TETANUS-DIPHTH-ACELL PERTUSSIS 5-2.5-18.5 LF-MCG/0.5 IM SUSP
0.5000 mL | Freq: Once | INTRAMUSCULAR | Status: DC
  Filled 2019-12-10: qty 0.5

## 2019-12-10 NOTE — Discharge Instructions (Signed)
Keep the wound clean, dry, and covered. Follow-up with your provider for suture removal in 10-12 days.

## 2019-12-10 NOTE — ED Triage Notes (Signed)
Pt in via POV, reports cutting left thumb on glass ashtray.  Dry bandage in place upon arrival.  Bleeding controlled at this time.

## 2019-12-12 NOTE — ED Provider Notes (Signed)
[facility] Emergency Department Provider Note ____________________________________________  Time seen: 1718  I have reviewed the triage vital signs and the nursing notes.  HISTORY  Chief Complaint  Laceration  HPI Debbie Townsend is a 33 y.o. female presents to the ED for evaluation of a cut to the left thumb. She presents to the ED because the wound would not stop bleeding. She reports an current tetanus status.    Past Medical History:  Diagnosis Date  . Anxiety   . BRCA negative   . Depression   . Family history of anesthesia complication     mother had Post -op nausea and dizziness.  . Foot drop, left foot   . GERD (gastroesophageal reflux disease)   . Headache   . History of kidney stones   . Hypertension   . Kidney stone   . Pseudotumor cerebri 2007 or 2009    Patient Active Problem List   Diagnosis Date Noted  . Annual physical exam 09/24/2019  . Insomnia 09/24/2019  . Fatigue 09/24/2019  . History of kidney stones 09/24/2019  . Class 3 severe obesity due to excess calories without serious comorbidity with body mass index (BMI) of 40.0 to 44.9 in adult ([place]) 05/03/2019  . Allergic rhinitis 03/24/2019  . Status post laparoscopic hysterectomy 02/12/2019  . Adenomyosis 02/04/2019  . DUB (dysfunctional uterine bleeding) 09/10/2018  . Fatty liver 04/02/2018  . Gallstones 04/02/2018  . Heavy menses 04/02/2018  . Family history of breast cancer 04/02/2018  . Anxiety 04/02/2018  . Anxiety and depression 12/16/2016  . BMI 40.0-44.9, adult ([place]) 01/04/2016  . Essential hypertension 11/13/2015  . Pseudotumor cerebri 11/13/2015  . HNP (herniated nucleus pulposus), lumbar 09/22/2014    Past Surgical History:  Procedure Laterality Date  . CESAREAN SECTION     2009/2017  . CHOLECYSTECTOMY N/A 02/04/2019   Procedure: LAPAROSCOPIC CHOLECYSTECTOMY;  Surgeon: Fredirick Maudlin, MD;  Location: [facility] ORS;  Service: General;  Laterality: N/A;  .  LUMBAR LAMINECTOMY/DECOMPRESSION MICRODISCECTOMY Left 09/22/2014   Procedure: Left Lumbar four-five microdiskectomy;  Surgeon: Consuella Lose, MD;  Location: [place] NEURO ORS;  Service: Neurosurgery;  Laterality: Left;  Left Lumbar four-five microdiskectomy  . WISDOM TOOTH EXTRACTION      Prior to Admission medications   Medication Sig Start Date End Date Taking? Authorizing Provider  albuterol (PROVENTIL HFA;VENTOLIN HFA) 108 (90 Base) MCG/ACT inhaler Inhale 1-2 puffs into the lungs every 4 (four) hours as needed for wheezing or shortness of breath. 03/24/19   McLean-Scocuzza, Nino Glow, MD  ALPRAZolam Duanne Moron) 0.25 MG tablet Take 1 tablet (0.25 mg total) by mouth daily as needed for anxiety. 09/24/19   McLean-Scocuzza, Nino Glow, MD  cholestyramine light (PREVALITE) 4 g packet Take 1 packet (4 g total) by mouth 2 (two) times daily for 10 days. 02/15/19 02/25/19  Fredirick Maudlin, MD  hydrocortisone (ANUSOL-HC) 2.5 % rectal cream Place 1 application rectally 4 (four) times daily. 08/02/19   McLean-Scocuzza, Nino Glow, MD  Lidocaine, Anorectal, 5 % CREA Apply 1 application topically 4 (four) times daily as needed. For pain in rectum 08/02/19   McLean-Scocuzza, Nino Glow, MD  lisinopril (ZESTRIL) 20 MG tablet Take 1 tablet (20 mg total) by mouth daily. 10/28/19   McLean-Scocuzza, Nino Glow, MD  phentermine (ADIPEX-P) 37.5 MG tablet One tablet po in morning. 10/19/19   Gae Dry, MD  sertraline (ZOLOFT) 100 MG tablet Take 1 tablet (100 mg total) by mouth daily. 04/16/19   McLean-Scocuzza, Nino Glow, MD  topiramate (  TOPAMAX) 50 MG tablet Take 50 mg by mouth 2 (two) times daily.  06/21/19   [provider]    Allergies Apple, Carrot [daucus carota], Monistat [miconazole], Other, and Amoxicillin  Family History  Problem Relation Age of Onset  . Cancer Mother        breast dx'ed age 84/49  . Hypertension Mother   . Diabetes Mother   . Hypothyroidism Mother   . Depression Mother   . Breast cancer Mother         87s  . Liver disease Father   . Heart attack Father   . Arthritis Father   . Alcohol abuse Father   . Depression Father   . Drug abuse Father   . Early death Father   . Heart disease Father   . Hypertension Father   . Depression Sister   . Hypertension Sister   . Arthritis Sister   . Hypertension Sister   . Cancer Maternal Grandmother        stomach>liver>brain met  . Cancer Maternal Uncle        lung not a smoker     Social History Social History   Tobacco Use  . Smoking status: Current Every Day Smoker    Packs/day: 0.50    Types: Cigarettes  . Smokeless tobacco: Never Used  Substance Use Topics  . Alcohol use: Yes  . Drug use: No    Review of Systems  Constitutional: Negative for fever. Cardiovascular: Negative for chest pain. Respiratory: Negative for shortness of breath. Musculoskeletal: Negative for back pain. Left thumb lac as above  Skin: Negative for rash. Neurological: Negative for headaches, focal weakness or numbness. ____________________________________________  PHYSICAL EXAM:  VITAL SIGNS: ED Triage Vitals [12/10/19 1641]  Enc Vitals Group     BP      Pulse      Resp      Temp      Temp src      SpO2      Weight 260 lb (117.9 kg)     Height '5\' 7"'  (1.702 m)     Head Circumference      Peak Flow      Pain Score 6     Pain Loc      Pain Edu?      Excl. in [place]?     Constitutional: Alert and oriented. Well appearing and in no distress. Head: Normocephalic and atraumatic. Eyes: Conjunctivae are normal. Normal extraocular movements Cardiovascular: Normal rate, regular rhythm. Normal distal pulses. Respiratory: Normal respiratory effort. Musculoskeletal: Normal composite fist on the left.  Nontender with normal range of motion in all extremities.  Neurologic:  Normal gross sensation. Normal speech and language. No gross focal neurologic deficits are appreciated. Skin:  Skin is warm, dry and intact. No rash  noted. ____________________________________________  PROCEDURES  .Marland KitchenLaceration Repair  Date/Time: 12/10/2019 5:20 PM Performed by: Melvenia Needles, PA-C Authorized by: Melvenia Needles, PA-C   Consent:    Consent obtained:  Verbal   Consent given by:  Patient   Risks discussed:  Pain and poor wound healing   Alternatives discussed:  Observation Anesthesia (see MAR for exact dosages):    Anesthesia method:  Nerve block   Block needle gauge:  27 G   Block anesthetic:  Lidocaine 1% w/o epi   Block injection procedure:  Introduced needle, negative aspiration for blood, incremental injection and anatomic landmarks palpated   Block outcome:  Anesthesia achieved Laceration details:  Location:  Finger   Finger location:  L thumb   Length (cm):  0.5   Depth (mm):  3 Repair type:    Repair type:  Simple Pre-procedure details:    Preparation:  Patient was prepped and draped in usual sterile fashion Exploration:    Hemostasis achieved with:  Tourniquet   Contaminated: no   Treatment:    Area cleansed with:  Betadine   Amount of cleaning:  Standard Skin repair:    Repair method:  Sutures   Suture size:  4-0   Suture material:  Nylon   Suture technique:  Simple interrupted   Number of sutures:  4 Approximation:    Approximation:  Close Post-procedure details:    Dressing:  Non-adherent dressing   Patient tolerance of procedure:  Tolerated well, no immediate complications  ____________________________________________  INITIAL IMPRESSION / ASSESSMENT AND PLAN / ED COURSE  Patient with ED evaluation of axonal laceration to the left thumb.  The wound is repaired using sutures and hemostasis is achieved.  Patient is discharged with wound care instructions patient follow-up with primary provider in 10 to 12 days for suture removal.  Jacklynn Lewis was evaluated in Emergency Department on 12/12/2019 for the symptoms described in the history of present illness. She  was evaluated in the context of the global COVID-19 pandemic, which necessitated consideration that the patient might be at risk for infection with the SARS-CoV-2 virus that causes COVID-19. Institutional protocols and algorithms that pertain to the evaluation of patients at risk for COVID-19 are in a state of rapid change based on information released by regulatory bodies including the CDC and federal and state organizations. These policies and algorithms were followed during the patient's care in the ED. ____________________________________________  FINAL CLINICAL IMPRESSION(S) / ED DIAGNOSES  Final diagnoses:  Laceration of left thumb without foreign body without damage to nail, initial encounter      Melvenia Needles, PA-C 12/12/19 2133    Arta Silence, MD 12/19/19 [phone]

## 2019-12-14 ENCOUNTER — Other Ambulatory Visit: Payer: Self-pay

## 2019-12-14 ENCOUNTER — Ambulatory Visit
Admission: RE | Admit: 2019-12-14 | Discharge: 2019-12-14 | Disposition: A | Discharge status: Home / Self Care | Payer: BC Managed Care – PPO | Source: Ambulatory Visit | Attending: Neurology | Admitting: Neurology

## 2019-12-14 DIAGNOSIS — R519 Headache, unspecified: Secondary | ICD-10-CM | POA: Diagnosis not present

## 2019-12-14 DIAGNOSIS — G932 Benign intracranial hypertension: Secondary | ICD-10-CM

## 2019-12-21 ENCOUNTER — Ambulatory Visit: Payer: BC Managed Care – PPO | Admitting: Obstetrics & Gynecology

## 2019-12-30 DIAGNOSIS — G932 Benign intracranial hypertension: Secondary | ICD-10-CM | POA: Diagnosis not present

## 2019-12-30 DIAGNOSIS — G9601 Cranial cerebrospinal fluid leak, spontaneous: Secondary | ICD-10-CM | POA: Diagnosis not present

## 2019-12-31 ENCOUNTER — Encounter: Payer: Self-pay | Admitting: Internal Medicine

## 2020-01-04 ENCOUNTER — Ambulatory Visit (INDEPENDENT_AMBULATORY_CARE_PROVIDER_SITE_OTHER): Payer: BC Managed Care – PPO | Admitting: Obstetrics and Gynecology

## 2020-01-04 ENCOUNTER — Encounter: Payer: Self-pay | Admitting: Obstetrics and Gynecology

## 2020-01-04 ENCOUNTER — Other Ambulatory Visit: Payer: Self-pay

## 2020-01-04 VITALS — BP 124/70 | Ht 66.0 in | Wt 266.0 lb

## 2020-01-04 DIAGNOSIS — B028 Zoster with other complications: Secondary | ICD-10-CM

## 2020-01-04 MED ORDER — VALACYCLOVIR HCL 1 G PO TABS: 1000.0000 mg | ORAL_TABLET | Freq: Three times a day (TID) | ORAL | 0 refills | Status: AC

## 2020-01-04 NOTE — Progress Notes (Signed)
Patient ID: Debbie Townsend, female   DOB: 04-19-1986, 34 y.o.   MRN: [id number]  Reason for Consult: Vaginal Pain (Vaginal bump been there x1 month, painful and feels squishy )   Referred by McLean-Scocuzza, [name] *  Subjective:     HPI:  Debbie Townsend is a 34 y.o. female. She presents today with complaints of a pump on her buttocks. She says that is is red/purple and has been very painful. She and her husband thought it may of been a boil, but it never formed a head which could be drained. She has not had treatment or therapy for this before. She did have chicken pox as a child.    Past Medical History:  Diagnosis Date  . Anxiety   . BRCA negative   . Depression   . Family history of anesthesia complication     mother had Post -op nausea and dizziness.  . Foot drop, left foot   . GERD (gastroesophageal reflux disease)   . Headache   . History of kidney stones   . Hypertension   . Kidney stone   . Pseudotumor cerebri 2007 or 2009   Family History  Problem Relation Age of Onset  . Cancer Mother        breast dx'ed age 76/49  . Hypertension Mother   . Diabetes Mother   . Hypothyroidism Mother   . Depression Mother   . Breast cancer Mother        16s  . Liver disease Father   . Heart attack Father   . Arthritis Father   . Alcohol abuse Father   . Depression Father   . Drug abuse Father   . Early death Father   . Heart disease Father   . Hypertension Father   . Depression Sister   . Hypertension Sister   . Arthritis Sister   . Hypertension Sister   . Cancer Maternal Grandmother        stomach>liver>brain met  . Cancer Maternal Uncle        lung not a smoker    Past Surgical History:  Procedure Laterality Date  . CESAREAN SECTION     2009/2017  . CHOLECYSTECTOMY N/A 02/04/2019   Procedure: LAPAROSCOPIC CHOLECYSTECTOMY;  Surgeon: Fredirick Maudlin, MD;  Location: [facility] ORS;  Service: General;  Laterality: N/A;  . LUMBAR LAMINECTOMY/DECOMPRESSION  MICRODISCECTOMY Left 09/22/2014   Procedure: Left Lumbar four-five microdiskectomy;  Surgeon: Consuella Lose, MD;  Location: [place] [facility]UR[facility] ORS;  Service: Neurosurgery;  Laterality: Left;  Left Lumbar four-five microdiskectomy  . WISDOM TOOTH EXTRACTION      Short Social History:  Social History   Tobacco Use  . Smoking status: Current Every Day Smoker    Packs/day: 0.50    Types: Cigarettes  . Smokeless tobacco: Never Used  Substance Use Topics  . Alcohol use: Yes    Allergies  Allergen Reactions  . Apple Other (See Comments)    Lips itch and swell, inside of mouth gets hives  . Carrot [Daucus Carota] Other (See Comments)    Lips itch and swell, inside of mouth gets hives  . Monistat [Miconazole] Swelling    Severe vaginal itching and swelling  . Other Itching and Swelling    Almonds-mouth swells and itches  . Amoxicillin Hives, Itching, Swelling and Dermatitis    Did it involve swelling of the face/tongue/throat, SOB, or low BP? No Did it involve sudden or severe rash/hives, skin peeling, or any reaction on the inside  of your mouth or nose? Yes Did you need to seek medical attention at a hospital or doctor's office? Yes When did it last happen?within the past 5 years If all above answers are "NO", may proceed with cephalosporin use.     Current Outpatient Medications  Medication Sig Dispense Refill  . acetaZOLAMIDE (DIAMOX) 250 MG tablet Take 250 mg by mouth 2 (two) times daily.    Marland Kitchen albuterol (PROVENTIL HFA;VENTOLIN HFA) 108 (90 Base) MCG/ACT inhaler Inhale 1-2 puffs into the lungs every 4 (four) hours as needed for wheezing or shortness of breath. 1 Inhaler 0  . ALPRAZolam (XANAX) 0.25 MG tablet Take 1 tablet (0.25 mg total) by mouth daily as needed for anxiety. 30 tablet 0  . hydrocortisone (ANUSOL-HC) 2.5 % rectal cream Place 1 application rectally 4 (four) times daily. 60 g 0  . Lidocaine, Anorectal, 5 % CREA Apply 1 application topically 4 (four) times daily as  needed. For pain in rectum 30 g 2  . lisinopril (ZESTRIL) 20 MG tablet Take 1 tablet (20 mg total) by mouth daily. 90 tablet 3  . phentermine (ADIPEX-P) 37.5 MG tablet One tablet po in morning. 30 tablet 1  . sertraline (ZOLOFT) 100 MG tablet Take 1 tablet (100 mg total) by mouth daily. 90 tablet 3  . topiramate (TOPAMAX) 50 MG tablet Take 50 mg by mouth 2 (two) times daily.     . verapamil (CALAN-SR) 120 MG CR tablet Take by mouth.    . cholestyramine light (PREVALITE) 4 g packet Take 1 packet (4 g total) by mouth 2 (two) times daily for 10 days. 20 packet 0   No current facility-administered medications for this visit.    Review of Systems  Constitutional: Negative for chills, fatigue, fever and unexpected weight change.  HENT: Negative for trouble swallowing.  Eyes: Negative for loss of vision.  Respiratory: Negative for cough, shortness of breath and wheezing.  Cardiovascular: Negative for chest pain, leg swelling, palpitations and syncope.  GI: Negative for abdominal pain, blood in stool, diarrhea, nausea and vomiting.  GU: Negative for difficulty urinating, dysuria, frequency and hematuria.  Musculoskeletal: Negative for back pain, leg pain and joint pain.  Skin: Negative for rash.  Neurological: Negative for dizziness, headaches, light-headedness, numbness and seizures.  Psychiatric: Negative for behavioral problem, confusion, depressed mood and sleep disturbance.        Objective:  Objective   Vitals:   01/04/20 1334  BP: 124/70  Weight: 266 lb (120.7 kg)  Height: '5\' 6"'  (1.676 m)   Body mass index is 42.93 kg/m.  Physical Exam Vitals and nursing note reviewed.  Constitutional:      Appearance: She is well-developed.  HENT:     Head: Normocephalic and atraumatic.  Cardiovascular:     Rate and Rhythm: Normal rate and regular rhythm.  Pulmonary:     Effort: Pulmonary effort is normal.     Breath sounds: Normal breath sounds.  Abdominal:     General: Bowel sounds  are normal.     Palpations: Abdomen is soft.  Genitourinary:    Comments: External: Normal appearing vulva. Lesion noted on left buttock. Red. No fluctuance, no abscess or collection. See photo.     Musculoskeletal:        General: Normal range of motion.  Skin:    General: Skin is warm and dry.  Neurological:     Mental Status: She is alert and oriented to person, place, and time.  Psychiatric:  Behavior: Behavior normal.        Thought Content: Thought content normal.        Judgment: Judgment normal.            Assessment/Plan:     34 yo G2P1102 with buttocks lesion Appears to be a shingles lesion that seems to be resolving.  Will send viral PCR culture.  Will treat with valtrex TID. Follow up in 1 month.   More than 15 minutes were spent face to face with the patient in the room with more than 50% of the time spent providing counseling and discussing the plan of management.       Adrian Prows MD [facility] 01/04/2020 2:05 PM

## 2020-01-04 NOTE — Patient Instructions (Signed)
Shingles  Shingles is an infection. It gives you a painful skin rash and blisters that have fluid in them. Shingles is caused by the same germ (virus) that causes chickenpox. Shingles only happens in people who:  Have had chickenpox.  Have been given a shot of medicine (vaccine) to protect against chickenpox. Shingles is rare in this group. The first symptoms of shingles may be itching, tingling, or pain in an area on your skin. A rash will show on your skin a few days or weeks later. The rash is likely to be on one side of your body. The rash usually has a shape like a belt or a band. Over time, the rash turns into fluid-filled blisters. The blisters will break open, change into scabs, and dry up. Medicines may:  Help with pain and itching.  Help you get better sooner.  Help to prevent long-term problems. Follow these instructions at home: Medicines  Take over-the-counter and prescription medicines only as told by your doctor.  Put on an anti-itch cream or numbing cream where you have a rash, blisters, or scabs. Do this as told by your doctor. Helping with itching and discomfort   Put cold, wet cloths (cold compresses) on the area of the rash or blisters as told by your doctor.  Cool baths can help you feel better. Try adding baking soda or dry oatmeal to the water to lessen itching. Do not bathe in hot water. Blister and rash care  Keep your rash covered with a loose bandage (dressing).  Wear loose clothing that does not rub on your rash.  Keep your rash and blisters clean. To do this, wash the area with mild soap and cool water as told by your doctor.  Check your rash every day for signs of infection. Check for: ? More redness, swelling, or pain. ? Fluid or blood. ? Warmth. ? Pus or a bad smell.  Do not scratch your rash. Do not pick at your blisters. To help you to not scratch: ? Keep your fingernails clean and cut short. ? Wear gloves or mittens when you sleep, if  scratching is a problem. General instructions  Rest as told by your doctor.  Keep all follow-up visits as told by your doctor. This is important.  Wash your hands often with soap and water. If soap and water are not available, use hand sanitizer. Doing this lowers your chance of getting a skin infection caused by germs (bacteria).  Your infection can cause chickenpox in people who have never had chickenpox or never got a shot of chickenpox vaccine. If you have blisters that did not change into scabs yet, try not to touch other people or be around other people, especially: ? Babies. ? Pregnant women. ? Children who have areas of red, itchy, or rough skin (eczema). ? Very old people who have transplants. ? People who have a long-term (chronic) sickness, like cancer or AIDS. Contact a doctor if:  Your pain does not get better with medicine.  Your pain does not get better after the rash heals.  You have any signs of infection in the rash area. These signs include: ? More redness, swelling, or pain around the rash. ? Fluid or blood coming from the rash. ? The rash area feeling warm to the touch. ? Pus or a bad smell coming from the rash. Get help right away if:  The rash is on your face or nose.  You have pain in your face or pain by   your eye.  You lose feeling on one side of your face.  You have trouble seeing.  You have ear pain, or you have ringing in your ear.  You have a loss of taste.  Your condition gets worse. Summary  Shingles gives you a painful skin rash and blisters that have fluid in them.  Shingles is an infection. It is caused by the same germ (virus) that causes chickenpox.  Keep your rash covered with a loose bandage (dressing). Wear loose clothing that does not rub on your rash.  If you have blisters that did not change into scabs yet, try not to touch other people or be around people. This information is not intended to replace advice given to you by  your health care provider. Make sure you discuss any questions you have with your health care provider. Document Revised: 03/26/2019 Document Reviewed: 08/06/2017 Elsevier Patient Education  2020 Elsevier Inc.  

## 2020-01-08 LAB — HSV AND VZV PCR PANEL
HSV 2 DNA: NEGATIVE
HSV-1 DNA: NEGATIVE
Varicella-Zoster, PCR: NEGATIVE

## 2020-01-10 DIAGNOSIS — Z6841 Body Mass Index (BMI) 40.0 and over, adult: Secondary | ICD-10-CM | POA: Diagnosis not present

## 2020-01-10 DIAGNOSIS — Z87891 Personal history of nicotine dependence: Secondary | ICD-10-CM | POA: Diagnosis not present

## 2020-01-10 DIAGNOSIS — G9601 Cranial cerebrospinal fluid leak, spontaneous: Secondary | ICD-10-CM | POA: Diagnosis not present

## 2020-01-10 DIAGNOSIS — G932 Benign intracranial hypertension: Secondary | ICD-10-CM | POA: Diagnosis not present

## 2020-01-10 DIAGNOSIS — G9600 Cerebrospinal fluid leak, unspecified: Secondary | ICD-10-CM | POA: Diagnosis not present

## 2020-01-10 DIAGNOSIS — J329 Chronic sinusitis, unspecified: Secondary | ICD-10-CM | POA: Diagnosis not present

## 2020-01-17 DIAGNOSIS — G932 Benign intracranial hypertension: Secondary | ICD-10-CM | POA: Diagnosis not present

## 2020-01-25 ENCOUNTER — Other Ambulatory Visit: Payer: Self-pay | Admitting: Obstetrics and Gynecology

## 2020-01-25 DIAGNOSIS — N909 Noninflammatory disorder of vulva and perineum, unspecified: Secondary | ICD-10-CM

## 2020-01-25 MED ORDER — CLOBETASOL PROPIONATE 0.05 % EX OINT: TOPICAL_OINTMENT | CUTANEOUS | 1 refills | Status: DC

## 2020-01-26 DIAGNOSIS — J343 Hypertrophy of nasal turbinates: Secondary | ICD-10-CM | POA: Diagnosis not present

## 2020-01-26 DIAGNOSIS — G9601 Cranial cerebrospinal fluid leak, spontaneous: Secondary | ICD-10-CM | POA: Diagnosis not present

## 2020-01-26 DIAGNOSIS — G932 Benign intracranial hypertension: Secondary | ICD-10-CM | POA: Diagnosis not present

## 2020-01-26 DIAGNOSIS — J302 Other seasonal allergic rhinitis: Secondary | ICD-10-CM | POA: Diagnosis not present

## 2020-02-10 ENCOUNTER — Other Ambulatory Visit: Payer: Self-pay | Admitting: Obstetrics & Gynecology

## 2020-02-10 DIAGNOSIS — G932 Benign intracranial hypertension: Secondary | ICD-10-CM | POA: Diagnosis not present

## 2020-02-10 NOTE — Telephone Encounter (Signed)
Called and left voice mail for patient to call back to be schedule °

## 2020-02-10 NOTE — Telephone Encounter (Signed)
Schedule annual

## 2020-02-11 ENCOUNTER — Encounter: Payer: Self-pay | Admitting: Internal Medicine

## 2020-02-11 ENCOUNTER — Telehealth: Payer: Self-pay | Admitting: Internal Medicine

## 2020-02-11 NOTE — Telephone Encounter (Signed)
If agreeable to my chart or telephone consult fee for this  [place]

## 2020-02-11 NOTE — Telephone Encounter (Signed)
Message sent via telephone encounter to Dr Mclean-Scocuzza.

## 2020-02-11 NOTE — Telephone Encounter (Signed)
Please advise 

## 2020-02-11 NOTE — Telephone Encounter (Signed)
Patient is agreeable to the charges.

## 2020-02-11 NOTE — Telephone Encounter (Signed)
Pt said she strained her back a few days ago. She is wondering if Dr. [name] could call in a muscle relaxer because over the counter medicine is not working. Please advise.

## 2020-02-13 ENCOUNTER — Encounter: Payer: Self-pay | Admitting: Internal Medicine

## 2020-02-13 ENCOUNTER — Telehealth (INDEPENDENT_AMBULATORY_CARE_PROVIDER_SITE_OTHER): Payer: BC Managed Care – PPO | Admitting: Internal Medicine

## 2020-02-13 DIAGNOSIS — G8929 Other chronic pain: Secondary | ICD-10-CM | POA: Diagnosis not present

## 2020-02-13 DIAGNOSIS — M545 Low back pain, unspecified: Secondary | ICD-10-CM

## 2020-02-13 MED ORDER — CYCLOBENZAPRINE HCL 5 MG PO TABS: 5.0000 mg | ORAL_TABLET | Freq: Every evening | ORAL | 2 refills | Status: DC | PRN

## 2020-02-13 NOTE — Telephone Encounter (Signed)
Patient is agreeable to the charges.      Documentation    You  Thressa Sheller, CMA 2 days ago  TM   If agreeable to my chart or telephone consult fee for this  [place]      Documentation    Thressa Sheller, CMA  You 2 days ago     Please advise        Documentation    Genella Rife H routed conversation to Thressa Sheller, CMA 2 days ago  Genella Rife H 2 days ago  BP   Pt said she strained her back a few days ago. She is wondering if Dr. [name] could call in a muscle relaxer because over the counter medicine is not working. Please advise.       Documentation    MRI 02/24/2014  FINDINGS:  There is scarring involving parts of the left kidney. Small left  renal cyst is partially seen on today's exam. Small retroperitoneal  lymph nodes are not pathologically enlarged by size criteria.   The lowest lumbar type non-rib-bearing vertebra is labeled as L5.  The conus medullaris appears normal. Conus level: T12.   Intervertebral disc desiccation is observed at all levels between L2  and L5 with loss of disc height at L3-4 and L4-5.   No significant vertebral marrow edema is identified. No vertebral  subluxation is observed. Additional findings at individual levels  are as follows:   L1-2: Unremarkable.   L2-3:  No impingement.  Disc bulge with central disc protrusion.   L3-4: No impingement. Disc bulge was central disc protrusion  extending caudad.   L4-5: Prominent left and mild right subarticular lateral recess  stenosis with moderate left eccentric central narrowing of the  thecal sac due to a left paracentral on lateral recess disc  protrusion   L5-S1:  No impingement.  Left eccentric disc bulge.   IMPRESSION:  1. If the dominant finding is a large left lateral recess and  paracentral disc protrusion at L4-5 causing prominent left and mild  right subarticular lateral recess stenosis along with moderate left  eccentric central narrowing of the  thecal sac.    A/p 1. Chronic back pain low back with recent strain Rx Flexeril prn  Pt will continue to f/u with chiropractor  Consider Dr. Sharlet Salina for steroid injections in the future if not better  Heat, back stretches  Rock tape  As needed Tylenol voltaren gel or Salonpas over the counter   [place] Time spent 5-10 min Pt agreeable to fee

## 2020-02-14 NOTE — Telephone Encounter (Signed)
Please advise 

## 2020-02-14 NOTE — Telephone Encounter (Signed)
Debbie Townsend, I need more info here.  This was 3 days ago.  Where is  Triage note?  How is patient doing?  Injury or heavy lifting? Back pain, cp, fever, urinary problems?   Pt may need UC.

## 2020-02-14 NOTE — Telephone Encounter (Signed)
Dr [name] has already addressed this.

## 2020-02-14 NOTE — Telephone Encounter (Signed)
close

## 2020-02-22 DIAGNOSIS — J302 Other seasonal allergic rhinitis: Secondary | ICD-10-CM | POA: Diagnosis not present

## 2020-02-22 DIAGNOSIS — Z881 Allergy status to other antibiotic agents status: Secondary | ICD-10-CM | POA: Diagnosis not present

## 2020-02-22 DIAGNOSIS — G9600 Cerebrospinal fluid leak, unspecified: Secondary | ICD-10-CM | POA: Diagnosis not present

## 2020-02-22 DIAGNOSIS — J343 Hypertrophy of nasal turbinates: Secondary | ICD-10-CM | POA: Diagnosis not present

## 2020-02-22 DIAGNOSIS — Z20822 Contact with and (suspected) exposure to covid-19: Secondary | ICD-10-CM | POA: Diagnosis not present

## 2020-02-22 DIAGNOSIS — I10 Essential (primary) hypertension: Secondary | ICD-10-CM | POA: Diagnosis not present

## 2020-02-22 DIAGNOSIS — Z880 Allergy status to penicillin: Secondary | ICD-10-CM | POA: Diagnosis not present

## 2020-02-22 DIAGNOSIS — J3489 Other specified disorders of nose and nasal sinuses: Secondary | ICD-10-CM | POA: Diagnosis not present

## 2020-02-22 DIAGNOSIS — Z91018 Allergy to other foods: Secondary | ICD-10-CM | POA: Diagnosis not present

## 2020-02-22 DIAGNOSIS — J329 Chronic sinusitis, unspecified: Secondary | ICD-10-CM | POA: Diagnosis not present

## 2020-02-22 DIAGNOSIS — J328 Other chronic sinusitis: Secondary | ICD-10-CM | POA: Diagnosis not present

## 2020-02-22 DIAGNOSIS — G932 Benign intracranial hypertension: Secondary | ICD-10-CM | POA: Diagnosis not present

## 2020-02-22 DIAGNOSIS — Q018 Encephalocele of other sites: Secondary | ICD-10-CM | POA: Diagnosis not present

## 2020-02-22 DIAGNOSIS — G9601 Cranial cerebrospinal fluid leak, spontaneous: Secondary | ICD-10-CM | POA: Diagnosis not present

## 2020-02-22 DIAGNOSIS — Z87891 Personal history of nicotine dependence: Secondary | ICD-10-CM | POA: Diagnosis not present

## 2020-02-25 DIAGNOSIS — J328 Other chronic sinusitis: Secondary | ICD-10-CM | POA: Diagnosis not present

## 2020-02-25 DIAGNOSIS — J329 Chronic sinusitis, unspecified: Secondary | ICD-10-CM | POA: Diagnosis not present

## 2020-02-25 DIAGNOSIS — J3489 Other specified disorders of nose and nasal sinuses: Secondary | ICD-10-CM | POA: Diagnosis not present

## 2020-02-25 DIAGNOSIS — G9601 Cranial cerebrospinal fluid leak, spontaneous: Secondary | ICD-10-CM | POA: Diagnosis not present

## 2020-02-27 DIAGNOSIS — G9601 Cranial cerebrospinal fluid leak, spontaneous: Secondary | ICD-10-CM | POA: Diagnosis not present

## 2020-02-27 MED ORDER — DSS 100 MG PO CAPS
100.00 | ORAL_CAPSULE | ORAL | Status: DC
Start: 2020-02-27 — End: 2020-02-27

## 2020-02-27 MED ORDER — SERTRALINE HCL 25 MG PO TABS
100.00 | ORAL_TABLET | ORAL | Status: DC
Start: 2020-02-28 — End: 2020-02-27

## 2020-02-27 MED ORDER — ACETAMINOPHEN 325 MG PO TABS
650.00 | ORAL_TABLET | ORAL | Status: DC
Start: 2020-02-27 — End: 2020-02-27

## 2020-02-27 MED ORDER — SALINE NASAL SPRAY 0.65 % NA SOLN
1.00 | NASAL | Status: DC
Start: 2020-02-27 — End: 2020-02-27

## 2020-02-27 MED ORDER — ONDANSETRON HCL 4 MG/2ML IJ SOLN
4.00 | INTRAMUSCULAR | Status: DC
Start: ? — End: 2020-02-27

## 2020-02-27 MED ORDER — ACETAZOLAMIDE 250 MG PO TABS
250.00 | ORAL_TABLET | ORAL | Status: DC
Start: 2020-02-27 — End: 2020-02-27

## 2020-02-27 MED ORDER — LACTATED RINGERS IV SOLN
INTRAVENOUS | Status: DC
Start: ? — End: 2020-02-27

## 2020-02-27 MED ORDER — CLINDAMYCIN HCL 150 MG PO CAPS
450.00 | ORAL_CAPSULE | ORAL | Status: DC
Start: 2020-02-27 — End: 2020-02-27

## 2020-02-27 MED ORDER — LIDOCAINE HCL 1 % IJ SOLN
0.50 | INTRAMUSCULAR | Status: DC
Start: ? — End: 2020-02-27

## 2020-02-27 MED ORDER — ALBUTEROL SULFATE HFA 108 (90 BASE) MCG/ACT IN AERS
2.00 | INHALATION_SPRAY | RESPIRATORY_TRACT | Status: DC
Start: ? — End: 2020-02-27

## 2020-02-27 MED ORDER — BUTALBITAL-APAP-CAFFEINE 50-325-40 MG PO TABS
1.00 | ORAL_TABLET | ORAL | Status: DC
Start: ? — End: 2020-02-27

## 2020-02-27 MED ORDER — LISINOPRIL 20 MG PO TABS
20.00 | ORAL_TABLET | ORAL | Status: DC
Start: 2020-02-28 — End: 2020-02-27

## 2020-02-27 MED ORDER — CODEINE SULFATE 30 MG PO TABS
30.00 | ORAL_TABLET | ORAL | Status: DC
Start: ? — End: 2020-02-27

## 2020-03-07 DIAGNOSIS — J3489 Other specified disorders of nose and nasal sinuses: Secondary | ICD-10-CM | POA: Diagnosis not present

## 2020-03-27 ENCOUNTER — Ambulatory Visit: Payer: BC Managed Care – PPO

## 2020-03-28 ENCOUNTER — Encounter: Payer: Self-pay | Admitting: Internal Medicine

## 2020-03-28 ENCOUNTER — Ambulatory Visit (INDEPENDENT_AMBULATORY_CARE_PROVIDER_SITE_OTHER): Payer: BC Managed Care – PPO | Admitting: Internal Medicine

## 2020-03-28 ENCOUNTER — Other Ambulatory Visit: Payer: Self-pay

## 2020-03-28 VITALS — BP 112/80 | HR 82 | Temp 98.2°F | Ht 66.0 in | Wt 271.8 lb

## 2020-03-28 DIAGNOSIS — F419 Anxiety disorder, unspecified: Secondary | ICD-10-CM

## 2020-03-28 DIAGNOSIS — J4520 Mild intermittent asthma, uncomplicated: Secondary | ICD-10-CM

## 2020-03-28 DIAGNOSIS — M7711 Lateral epicondylitis, right elbow: Secondary | ICD-10-CM

## 2020-03-28 DIAGNOSIS — R7303 Prediabetes: Secondary | ICD-10-CM

## 2020-03-28 DIAGNOSIS — G43919 Migraine, unspecified, intractable, without status migrainosus: Secondary | ICD-10-CM

## 2020-03-28 DIAGNOSIS — J40 Bronchitis, not specified as acute or chronic: Secondary | ICD-10-CM | POA: Diagnosis not present

## 2020-03-28 DIAGNOSIS — K760 Fatty (change of) liver, not elsewhere classified: Secondary | ICD-10-CM

## 2020-03-28 DIAGNOSIS — G932 Benign intracranial hypertension: Secondary | ICD-10-CM

## 2020-03-28 DIAGNOSIS — K219 Gastro-esophageal reflux disease without esophagitis: Secondary | ICD-10-CM

## 2020-03-28 DIAGNOSIS — I10 Essential (primary) hypertension: Secondary | ICD-10-CM

## 2020-03-28 MED ORDER — ALPRAZOLAM 0.25 MG PO TABS: 0.2500 mg | ORAL_TABLET | Freq: Every day | ORAL | 0 refills | Status: DC | PRN

## 2020-03-28 MED ORDER — SERTRALINE HCL 100 MG PO TABS: 100.0000 mg | ORAL_TABLET | Freq: Every day | ORAL | 3 refills | Status: DC

## 2020-03-28 MED ORDER — ALBUTEROL SULFATE HFA 108 (90 BASE) MCG/ACT IN AERS: 1.0000 | INHALATION_SPRAY | RESPIRATORY_TRACT | 12 refills | Status: DC | PRN

## 2020-03-28 NOTE — Patient Instructions (Addendum)
04/19/2020 Office Visit Neurology [name], [address] [place], [place] [address]  [phone]  [phone] (Fax)     Aimovig for migraines  Nurtec  Ubrelvy  Magnesium 250-400 mg daily for migraine prevention   Replika, bloom, headspace, calm, insight timer meditation apps Aromatherapy lavender/eucalytus   Voltaren gel right elbow        Tennis Elbow  Tennis elbow (lateral epicondylitis) is inflammation of tendons in your outer forearm, near your elbow. Tendons are tissues that connect muscle to bone. When you have tennis elbow, inflammation affects the tendons that you use to bend your wrist and move your hand up. Inflammation occurs in the lower part of the upper arm bone (humerus), where the tendons connect to the bone (lateral epicondyle). Tennis elbow often affects people who play tennis, but anyone may get the condition from repeatedly extending the wrist or turning the forearm. What are the causes? This condition is usually caused by repeatedly extending the wrist, turning the forearm, and using the hands. It can result from sports or work that requires repetitive forearm movements. In some cases, it may be caused by a sudden injury. What increases the risk? You are more likely to develop tennis elbow if you play tennis or another racket sport. You also have a higher risk if you frequently use your hands for work. Besides people who play tennis, others at greater risk include:  Musicians.  Carpenters, painters, and plumbers.  Cooks.  Cashiers.  People who work in [employer].  Architect workers.  Butchers.  People who use computers. What are the signs or symptoms? Symptoms of this condition include:  Pain and tenderness in the forearm and the outer part of the elbow. Pain may be felt only when using the arm, or it may be there all the time.  A burning feeling that starts in the elbow and spreads down the forearm.  A weak grip in the  hand. How is this diagnosed? This condition may be diagnosed based on:  Your symptoms and medical history.  A physical exam.  X-rays.  MRI. How is this treated? Resting and icing your arm is often the first treatment. Your health care provider may also recommend:  Medicines to reduce pain and inflammation. These may be in the form of a pill, topical gels, or shots of a steroid medicine (cortisone).  An elbow strap to reduce stress on the area.  Physical therapy. This may include massage or exercises.  An elbow brace to restrict the movements that cause symptoms. If these treatments do not help relieve your symptoms, your health care provider may recommend surgery to remove damaged muscle and reattach healthy muscle to bone. Follow these instructions at home: Activity  Rest your elbow and wrist and avoid activities that cause symptoms, as told by your health care provider.  Do physical therapy exercises as instructed.  If you lift an object, lift it with your palm facing up. This reduces stress on your elbow. Lifestyle  If your tennis elbow is caused by sports, check your equipment and make sure that: ? You are using it correctly. ? It is the best fit for you.  If your tennis elbow is caused by work or computer use, take frequent breaks to stretch your arm. Talk with your manager about ways to manage your condition at work. If you have a brace:  Wear the brace or strap as told by your health care provider. Remove it only as told by your  health care provider.  Loosen the brace if your fingers tingle, become numb, or turn cold and blue.  Keep the brace clean.  If the brace is not waterproof, ask if you may remove it for bathing. If you must keep the brace on while bathing: ? Do not let it get wet. ? Cover it with a watertight covering when you take a bath or a shower. General instructions   If directed, put ice on the painful area: ? Put ice in a plastic bag. ? Place  a towel between your skin and the bag. ? Leave the ice on for 20 minutes, 2-3 times a day.  Take over-the-counter and prescription medicines only as told by your health care provider.  Keep all follow-up visits as told by your health care provider. This is important. Contact a health care provider if:  You have pain that gets worse or does not get better with treatment.  You have numbness or weakness in your forearm, hand, or fingers. Summary  Tennis elbow (lateral epicondylitis) is inflammation of tendons in your outer forearm, near your elbow.  Common symptoms include pain and tenderness in your forearm and the outer part of your elbow.  This condition is usually caused by repeatedly extending your wrist, turning your forearm, and using your hands.  The first treatment is often resting and icing your arm to relieve symptoms. Further treatment may include taking medicine, getting physical therapy, wearing a brace or strap, or having surgery. This information is not intended to replace advice given to you by your health care provider. Make sure you discuss any questions you have with your health care provider. Document Revised: 08/28/2018 Document Reviewed: 09/16/2017 Elsevier Patient Education  [place] Ask your health care provider which exercises are safe for you. Do exercises exactly as told by your health care provider and adjust them as directed. It is normal to feel mild stretching, pulling, tightness, or discomfort as you do these exercises. Stop right away if you feel sudden pain or your pain gets worse. Do not begin these exercises until told by your health care provider. Stretching and range-of-motion exercises These exercises warm up your muscles and joints and improve the movement and flexibility of your elbow. These exercises also help to relieve pain, numbness, and tingling. Wrist flexion, assisted  1. Straighten your left / right elbow in  front of you with your palm facing down toward the floor. ? If told by your health care provider, bend your left / right elbow to a 90-degree angle (right angle) at your side. 2. With your other hand, gently push over the back of your left / right hand so your fingers point toward the floor (flexion). Stop when you feel a gentle stretch on the back of your forearm. 3. Hold this position for __________ seconds. Repeat __________ times. Complete this exercise __________ times a day. Wrist extension, assisted  1. Straighten your left / right elbow in front of you with your palm facing up toward the ceiling. ? If told by your health care provider, bend your left / right elbow to a 90-degree angle (right angle) at your side. 2. With your other hand, gently pull your left / right hand and fingers toward the floor (extension). Stop when you feel a gentle stretch on the palm side of your forearm. 3. Hold this position for __________ seconds. Repeat __________ times. Complete this exercise __________ times a day. Assisted forearm rotation, supination 1.  Sit or stand with your left / right elbow bent to a 90-degree angle (right angle) at your side. 2. Using your uninjured hand, turn (rotate) your left / right palm up toward the ceiling (supination) until you feel a gentle stretch along the inside of your forearm. 3. Hold this position for __________ seconds. Repeat __________ times. Complete this exercise __________ times a day. Assisted forearm rotation, pronation 1. Sit or stand with your left / right elbow bent to a 90-degree angle (right angle) at your side. 2. Using your uninjured hand, rotate your left / right palm down toward the floor (pronation) until you feel a gentle stretch along the outside of your forearm. 3. Hold this position for __________ seconds. Repeat __________ times. Complete this exercise __________ times a day. Strengthening exercises These exercises build strength and endurance  in your forearm and elbow. Endurance is the ability to use your muscles for a long time, even after they get tired. Radial deviation  1. Stand with a __________ weight or a hammer in your left / right hand. Or, sit while holding a rubber exercise band or tubing, with your left / right forearm supported on a table or countertop. ? If you are standing, position your forearm so that your thumb is facing forward. If you are sitting, position your forearm so that the thumb is facing the ceiling. This is the neutral position. 2. Raise your hand upward in front of you so your thumb moves toward the ceiling (radial deviation), or pull up on the rubber tubing. Keep your forearm and elbow still while you move your wrist only. 3. Hold this position for __________ seconds. 4. Slowly return to the starting position. Repeat __________ times. Complete this exercise __________ times a day. Wrist extension, eccentric 1. Sit with your left / right forearm palm-down and supported on a table or other surface. Let your left / right wrist extend over the edge of the surface. 2. Hold a __________ weight or a piece of exercise band or tubing in your left / right hand. ? If using a rubber exercise band or tubing, hold the other end of the tubing with your other hand. 3. Use your uninjured hand to move your left / right hand up toward the ceiling. 4. Take your uninjured hand away and slowly return to the starting position using only your left / right hand. Lowering your arm under tension is called eccentric extension. Repeat __________ times. Complete this exercise __________ times a day. Wrist extension Do not do this exercise if it causes pain at the outside of your elbow. Only do this exercise once instructed by your health care provider. 1. Sit with your left / right forearm supported on a table or other surface and your palm turned down toward the floor. Let your left / right wrist extend over the edge of the  surface. 2. Hold a __________ weight or a piece of rubber exercise band or tubing. ? If you are using a rubber exercise band or tubing, hold the band or tubing in place with your other hand to provide resistance. 3. Slowly bend your wrist so your hand moves up toward the ceiling (extension). Move only your wrist, keeping your forearm and elbow still. 4. Hold this position for __________ seconds. 5. Slowly return to the starting position. Repeat __________ times. Complete this exercise __________ times a day. Forearm rotation, supination To do this exercise, you will need a lightweight hammer or rubber mallet. 1. Sit with your  left / right forearm supported on a table or other surface. Bend your elbow to a 90-degree angle (right angle). Position your forearm so that your palm is facing down toward the floor, with your hand resting over the edge of the table. 2. Hold a hammer in your left / right hand. ? To make this exercise easier, hold the hammer near the head of the hammer. ? To make this exercise harder, hold the hammer near the end of the handle. 3. Without moving your wrist or elbow, slowly rotate your forearm so your palm faces up toward the ceiling (supination). 4. Hold this position for __________ seconds. 5. Slowly return to the starting position. Repeat __________ times. Complete this exercise __________ times a day. Shoulder blade squeeze 1. Sit in a stable chair or stand with good posture. If you are sitting down, do not let your back touch the back of the chair. 2. Your arms should be at your sides with your elbows bent to a 90-degree angle (right angle). Position your forearms so that your thumbs are facing the ceiling (neutral position). 3. Without lifting your shoulders up, squeeze your shoulder blades tightly together. 4. Hold this position for __________ seconds. 5. Slowly release and return to the starting position. Repeat __________ times. Complete this exercise __________  times a day. This information is not intended to replace advice given to you by your health care provider. Make sure you discuss any questions you have with your health care provider. Document Revised: 03/25/2019 Document Reviewed: 01/26/2019 Elsevier Patient Education  [place].  Gastroesophageal Reflux Disease, Adult Gastroesophageal reflux (GER) happens when acid from the stomach flows up into the tube that connects the mouth and the stomach (esophagus). Normally, food travels down the esophagus and stays in the stomach to be digested. However, when a person has GER, food and stomach acid sometimes move back up into the esophagus. If this becomes a more serious problem, the person may be diagnosed with a disease called gastroesophageal reflux disease (GERD). GERD occurs when the reflux:  Happens often.  Causes frequent or severe symptoms.  Causes problems such as damage to the esophagus. When stomach acid comes in contact with the esophagus, the acid may cause soreness (inflammation) in the esophagus. Over time, GERD may create small holes (ulcers) in the lining of the esophagus. What are the causes? This condition is caused by a problem with the muscle between the esophagus and the stomach (lower esophageal sphincter, or LES). Normally, the LES muscle closes after food passes through the esophagus to the stomach. When the LES is weakened or abnormal, it does not close properly, and that allows food and stomach acid to go back up into the esophagus. The LES can be weakened by certain dietary substances, medicines, and medical conditions, including:  Tobacco use.  Pregnancy.  Having a hiatal hernia.  Alcohol use.  Certain foods and beverages, such as coffee, chocolate, onions, and peppermint. What increases the risk? You are more likely to develop this condition if you:  Have an increased body weight.  Have a connective tissue disorder.  Use NSAID medicines. What are  the signs or symptoms? Symptoms of this condition include:  Heartburn.  Difficult or painful swallowing.  The feeling of having a lump in the throat.  Abitter taste in the mouth.  Bad breath.  Having a large amount of saliva.  Having an upset or bloated stomach.  Belching.  Chest pain. Different conditions can cause chest pain. Make  sure you see your health care provider if you experience chest pain.  Shortness of breath or wheezing.  Ongoing (chronic) cough or a night-time cough.  Wearing away of tooth enamel.  Weight loss. How is this diagnosed? Your health care provider will take a medical history and perform a physical exam. To determine if you have mild or severe GERD, your health care provider may also monitor how you respond to treatment. You may also have tests, including:  A test to examine your stomach and esophagus with a small camera (endoscopy).  A test thatmeasures the acidity level in your esophagus.  A test thatmeasures how much pressure is on your esophagus.  A barium swallow or modified barium swallow test to show the shape, size, and functioning of your esophagus. How is this treated? The goal of treatment is to help relieve your symptoms and to prevent complications. Treatment for this condition may vary depending on how severe your symptoms are. Your health care provider may recommend:  Changes to your diet.  Medicine.  Surgery. Follow these instructions at home: Eating and drinking   Follow a diet as recommended by your health care provider. This may involve avoiding foods and drinks such as: ? Coffee and tea (with or without caffeine). ? Drinks that containalcohol. ? Energy drinks and sports drinks. ? Carbonated drinks or sodas. ? Chocolate and cocoa. ? Peppermint and mint flavorings. ? Garlic and onions. ? Horseradish. ? Spicy and acidic foods, including peppers, chili powder, curry powder, vinegar, hot sauces, and barbecue  sauce. ? Citrus fruit juices and citrus fruits, such as oranges, lemons, and limes. ? Tomato-based foods, such as red sauce, chili, salsa, and pizza with red sauce. ? Fried and fatty foods, such as donuts, french fries, potato chips, and high-fat dressings. ? High-fat meats, such as hot dogs and fatty cuts of red and white meats, such as rib eye steak, sausage, ham, and bacon. ? High-fat dairy items, such as whole milk, butter, and cream cheese.  Eat small, frequent meals instead of large meals.  Avoid drinking large amounts of liquid with your meals.  Avoid eating meals during the 2-3 hours before bedtime.  Avoid lying down right after you eat.  Do not exercise right after you eat. Lifestyle   Do not use any products that contain nicotine or tobacco, such as cigarettes, e-cigarettes, and chewing tobacco. If you need help quitting, ask your health care provider.  Try to reduce your stress by using methods such as yoga or meditation. If you need help reducing stress, ask your health care provider.  If you are overweight, reduce your weight to an amount that is healthy for you. Ask your health care provider for guidance about a safe weight loss goal. General instructions  Pay attention to any changes in your symptoms.  Take over-the-counter and prescription medicines only as told by your health care provider. Do not take aspirin, ibuprofen, or other NSAIDs unless your health care provider told you to do so.  Wear loose-fitting clothing. Do not wear anything tight around your waist that causes pressure on your abdomen.  Raise (elevate) the head of your bed about 6 inches (15 cm).  Avoid bending over if this makes your symptoms worse.  Keep all follow-up visits as told by your health care provider. This is important. Contact a health care provider if:  You have: ? New symptoms. ? Unexplained weight loss. ? Difficulty swallowing or it hurts to swallow. ? Wheezing or a persistent  cough. ? A hoarse voice.  Your symptoms do not improve with treatment. Get help right away if you:  Have pain in your arms, neck, jaw, teeth, or back.  Feel sweaty, dizzy, or light-headed.  Have chest pain or shortness of breath.  Vomit and your vomit looks like blood or coffee grounds.  Faint.  Have stool that is bloody or black.  Cannot swallow, drink, or eat. Summary  Gastroesophageal reflux happens when acid from the stomach flows up into the esophagus. GERD is a disease in which the reflux happens often, causes frequent or severe symptoms, or causes problems such as damage to the esophagus.  Treatment for this condition may vary depending on how severe your symptoms are. Your health care provider may recommend diet and lifestyle changes, medicine, or surgery.  Contact a health care provider if you have new or worsening symptoms.  Take over-the-counter and prescription medicines only as told by your health care provider. Do not take aspirin, ibuprofen, or other NSAIDs unless your health care provider told you to do so.  Keep all follow-up visits as told by your health care provider. This is important. This information is not intended to replace advice given to you by your health care provider. Make sure you discuss any questions you have with your health care provider. Document Revised: 06/10/2018 Document Reviewed: 06/10/2018 Elsevier Patient Education  2020 [place] for Gastroesophageal Reflux Disease, Adult When you have gastroesophageal reflux disease (GERD), the foods you eat and your eating habits are very important. Choosing the right foods can help ease the discomfort of GERD. Consider working with a diet and nutrition specialist (dietitian) to help you make healthy food choices. What general guidelines should I follow?  Eating plan  Choose healthy foods low in fat, such as fruits, vegetables, whole grains, low-fat dairy products, and lean  meat, fish, and poultry.  Eat frequent, small meals instead of three large meals each day. Eat your meals slowly, in a relaxed setting. Avoid bending over or lying down until 2-3 hours after eating.  Limit high-fat foods such as fatty meats or fried foods.  Limit your intake of oils, butter, and shortening to less than 8 teaspoons each day.  Avoid the following: ? Foods that cause symptoms. These may be different for different people. Keep a food diary to keep track of foods that cause symptoms. ? Alcohol. ? Drinking large amounts of liquid with meals. ? Eating meals during the 2-3 hours before bed.  Cook foods using methods other than frying. This may include baking, grilling, or broiling. Lifestyle  Maintain a healthy weight. Ask your health care provider what weight is healthy for you. If you need to lose weight, work with your health care provider to do so safely.  Exercise for at least 30 minutes on 5 or more days each week, or as told by your health care provider.  Avoid wearing clothes that fit tightly around your waist and chest.  Do not use any products that contain nicotine or tobacco, such as cigarettes and e-cigarettes. If you need help quitting, ask your health care provider.  Sleep with the head of your bed raised. Use a wedge under the mattress or blocks under the bed frame to raise the head of the bed. What foods are not recommended? The items listed may not be a complete list. Talk with your dietitian about what dietary choices are best for you. Grains Pastries or quick breads with added fat.  [place] toast. Vegetables Deep fried vegetables. [place] fries. Any vegetables prepared with added fat. Any vegetables that cause symptoms. For some people this may include tomatoes and tomato products, chili peppers, onions and garlic, and horseradish. Fruits Any fruits prepared with added fat. Any fruits that cause symptoms. For some people this may include citrus fruits, such  as oranges, grapefruit, pineapple, and lemons. Meats and other protein foods High-fat meats, such as fatty beef or pork, hot dogs, ribs, ham, sausage, salami and bacon. Fried meat or protein, including fried fish and fried chicken. Nuts and nut butters. Dairy Whole milk and chocolate milk. Sour cream. Cream. Ice cream. Cream cheese. Milk shakes. Beverages Coffee and tea, with or without caffeine. Carbonated beverages. Sodas. Energy drinks. Fruit juice made with acidic fruits (such as orange or grapefruit). Tomato juice. Alcoholic drinks. Fats and oils Butter. Margarine. Shortening. Ghee. Sweets and desserts Chocolate and cocoa. Donuts. Seasoning and other foods Pepper. Peppermint and spearmint. Any condiments, herbs, or seasonings that cause symptoms. For some people, this may include curry, hot sauce, or vinegar-based salad dressings. Summary  When you have gastroesophageal reflux disease (GERD), food and lifestyle choices are very important to help ease the discomfort of GERD.  Eat frequent, small meals instead of three large meals each day. Eat your meals slowly, in a relaxed setting. Avoid bending over or lying down until 2-3 hours after eating.  Limit high-fat foods such as fatty meat or fried foods. This information is not intended to replace advice given to you by your health care provider. Make sure you discuss any questions you have with your health care provider. Document Revised: 03/25/2019 Document Reviewed: 12/03/2016 Elsevier Patient Education  [place].

## 2020-03-28 NOTE — Progress Notes (Signed)
Chief Complaint  Patient presents with  . Follow-up  . Immunizations    Twinrix 3rd dose   F/u  1. 3rd twinrix due for fatty liver today  2. Anxiety on xanax 0.25 mg qd prn will Rx today GAD 7 today 7 today also refill of zoloft 100 mg qd 3. C/o right elbow pain x months northing tried she was doing heavy pulling months ago and has pain with ROM no h/o seeing ortho and sees chiropractor  4. Pseudotumor cerebri with recent CSF leak and repair with [facility] f/u 04/18/20 ENT [facility] Dr. Burnard Bunting and neurology for migraines 04/04/20 and 04/13/20 [facility] ey e 5. Migraines topomax made her feel groggy not taking and emgality which wears off by the end of the month and she does not like the was maxalt makes her feel  6. GERd pepcid 20 mg qd helps sx's 7. HTN lis on 20 mg qd Bp controlled   Review of Systems  Constitutional: Negative for weight loss.  HENT: Negative for hearing loss.   Eyes: Negative for blurred vision.  Respiratory: Negative for shortness of breath.   Cardiovascular: Negative for chest pain.  Gastrointestinal: Negative for abdominal pain.  Musculoskeletal: Negative for falls.  Skin: Negative for rash.  Neurological: Negative for headaches.  Psychiatric/Behavioral: Negative for depression.   Past Medical History:  Diagnosis Date  . Anxiety   . BRCA negative   . Depression   . Family history of anesthesia complication     mother had Post -op nausea and dizziness.  . Foot drop, left foot   . GERD (gastroesophageal reflux disease)   . Headache   . History of kidney stones   . Hypertension   . Kidney stone   . Pseudotumor cerebri 2007 or 2009   Past Surgical History:  Procedure Laterality Date  . CESAREAN SECTION     2009/2017  . CHOLECYSTECTOMY N/A 02/04/2019   Procedure: LAPAROSCOPIC CHOLECYSTECTOMY;  Surgeon: Fredirick Maudlin, MD;  Location: [facility] ORS;  Service: General;  Laterality: N/A;  . LUMBAR LAMINECTOMY/DECOMPRESSION MICRODISCECTOMY Left 09/22/2014   Procedure: Left  Lumbar four-five microdiskectomy;  Surgeon: Consuella Lose, MD;  Location: [facility];  Service: Neurosurgery;  Laterality: Left;  Left Lumbar four-five microdiskectomy  . WISDOM TOOTH EXTRACTION     Family History  Problem Relation Age of Onset  . Cancer Mother        breast dx'ed age 54/49  . Hypertension Mother   . Diabetes Mother   . Hypothyroidism Mother   . Depression Mother   . Breast cancer Mother        78s  . Liver disease Father   . Heart attack Father   . Arthritis Father   . Alcohol abuse Father   . Depression Father   . Drug abuse Father   . Early death Father   . Heart disease Father   . Hypertension Father   . Depression Sister   . Hypertension Sister   . Arthritis Sister   . Hypertension Sister   . Cancer Maternal Grandmother        stomach>liver>brain met  . Cancer Maternal Uncle        lung not a smoker    Social History   Socioeconomic History  . Marital status: Married    Spouse name: Not on file  . Number of children: 2  . Years of education: Not on file  . Highest education level: Not on file  Occupational History  . Not on  file  Tobacco Use  . Smoking status: Current Every Day Smoker    Packs/day: 0.50    Types: Cigarettes  . Smokeless tobacco: Never Used  Substance and Sexual Activity  . Alcohol use: Yes  . Drug use: No  . Sexual activity: Yes    Birth control/protection: Rhythm  Other Topics Concern  . 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 Difficulty of Paying Living Expenses:   Food Insecurity:   . Worried About Charity fundraiser in the Last Year:   . Arboriculturist in the Last Year:   Transportation Needs:   . Film/video editor (Medical):   Marland Kitchen Lack of Transportation (Non-Medical):   Physical Activity:    . Days of Exercise per Week:   . Minutes of Exercise per Session:   Stress:   . Feeling of Stress :   Social Connections:   . Frequency of Communication with Friends and Family:   . Frequency of Social Gatherings with Friends and Family:   . Attends Religious Services:   . Active Member of Clubs or Organizations:   . Attends Archivist Meetings:   Marland Kitchen Marital Status:   Intimate Partner Violence:   . Fear of Current or Ex-Partner:   . Emotionally Abused:   Marland Kitchen Physically Abused:   . Sexually Abused:    Current Meds  Medication Sig  . acetaZOLAMIDE (DIAMOX) 500 MG capsule Take 1,000 mg by mouth in the morning.  Marland Kitchen albuterol (VENTOLIN HFA) 108 (90 Base) MCG/ACT inhaler Inhale 1-2 puffs into the lungs every 4 (four) hours as needed for wheezing or shortness of breath.  . ALPRAZolam (XANAX) 0.25 MG tablet Take 1 tablet (0.25 mg total) by mouth daily as needed for anxiety.  . cyclobenzaprine (FLEXERIL) 5 MG tablet Take 1-2 tablets (5-10 mg total) by mouth at bedtime as needed for muscle spasms.  . famotidine (PEPCID) 20 MG tablet Take 20 mg by mouth daily.  Marland Kitchen Galcanezumab-gnlm (EMGALITY) 120 MG/ML SOAJ Inject into the skin.  Marland Kitchen levocetirizine (XYZAL) 5 MG tablet Take 5 mg by mouth every evening.  Marland Kitchen lisinopril (ZESTRIL) 20 MG tablet Take 1 tablet (20 mg total) by mouth daily.  . sertraline (ZOLOFT) 100 MG tablet Take 1 tablet (100 mg total) by mouth daily.  . [DISCONTINUED] acetaZOLAMIDE (DIAMOX) 250 MG tablet Take 250 mg by mouth 2 (two) times daily.  . [DISCONTINUED] albuterol (PROVENTIL HFA;VENTOLIN HFA) 108 (90 Base) MCG/ACT inhaler Inhale 1-2 puffs into the lungs every 4 (four) hours as needed for wheezing or shortness of breath.  . [DISCONTINUED] ALPRAZolam (XANAX) 0.25 MG tablet Take 1 tablet (0.25 mg total) by mouth daily as needed for anxiety.  . [DISCONTINUED] rizatriptan (MAXALT) 5 MG tablet Take at onset of migraine. May take a second dose at 2 hours if migraine continues.  Do not take more than 2 doses/day or 2 days/week.  . [DISCONTINUED] sertraline (ZOLOFT) 100 MG tablet Take 1 tablet (100 mg total) by mouth daily.  . [DISCONTINUED] topiramate (TOPAMAX) 50 MG tablet Take 50 mg by mouth 2 (two) times daily.    Allergies  Allergen Reactions  . Apple Other (See Comments)  Lips itch and swell, inside of mouth gets hives  . Carrot [Daucus Carota] Other (See Comments)    Lips itch and swell, inside of mouth gets hives  . Monistat [Miconazole] Swelling    Severe vaginal itching and swelling  . Other Itching and Swelling    Almonds-mouth swells and itches  . Almond Oil Hives  . Amoxicillin Hives, Itching, Swelling and Dermatitis    Did it involve swelling of the face/tongue/throat, SOB, or low BP? No Did it involve sudden or severe rash/hives, skin peeling, or any reaction on the inside of your mouth or nose? Yes Did you need to seek medical attention at a hospital or doctor's office? Yes When did it last happen?within the past 5 years If all above answers are "NO", may proceed with cephalosporin use.   . Imitrex [Sumatriptan]     Felt like heart attack    Recent Results (from the past 2160 hour(s))  HSV and VZV PCR Panel     Status: None   Collection Time: 01/04/20  4:16 PM  Result Value Ref Range   Varicella-Zoster, PCR Negative Negative    Comment: No Varicella Zoster Virus DNA detected. This test was developed and its performance characteristics determined by [facility].  It has not been cleared or approved by the Food and Drug Administration.  The FDA has determined that such clearance or approval is not necessary.    HSV-1 DNA Negative Negative   HSV 2 DNA Negative Negative    Comment: This test was developed and its performance characteristics determined by [employer]. It has not been cleared or approved by the U.S. Food and Drug Administration. The FDA has determined that such clearance or approval is not necessary. This test is  used for clinical purposes. It should not be regarded as investigational or research.    Objective  Body mass index is 43.87 kg/m. Wt Readings from Last 3 Encounters:  03/28/20 271 lb 12.8 oz (123.3 kg)  01/04/20 266 lb (120.7 kg)  12/10/19 260 lb (117.9 kg)   Temp Readings from Last 3 Encounters:  03/28/20 98.2 F (36.8 C) (Temporal)  09/24/19 98.6 F (37 C) (Oral)  02/20/19 98 F (36.7 C)   BP Readings from Last 3 Encounters:  03/28/20 112/80  01/04/20 124/70  09/24/19 108/70   Pulse Readings from Last 3 Encounters:  03/28/20 82  09/24/19 72  02/20/19 75    Physical Exam Vitals and nursing note reviewed.  Constitutional:      Appearance: Normal appearance. She is well-developed and well-groomed. She is morbidly obese.  HENT:     Head: Normocephalic and atraumatic.  Eyes:     Conjunctiva/sclera: Conjunctivae normal.     Pupils: Pupils are equal, round, and reactive to light.  Cardiovascular:     Rate and Rhythm: Normal rate and regular rhythm.     Heart sounds: Normal heart sounds. No murmur.  Pulmonary:     Effort: Pulmonary effort is normal.     Breath sounds: Normal breath sounds.  Skin:    General: Skin is warm and dry.  Neurological:     General: No focal deficit present.     Mental Status: She is alert and oriented to person, place, and time. Mental status is at baseline.     Gait: Gait normal.  Psychiatric:        Attention and Perception: Attention and perception normal.        Mood and Affect: Mood and affect normal.  Speech: Speech normal.        Behavior: Behavior normal. Behavior is cooperative.        Thought Content: Thought content normal.        Cognition and Memory: Cognition and memory normal.        Judgment: Judgment normal.     Assessment  Plan  Mild intermittent extrinsic asthma without complication - Plan: albuterol (VENTOLIN HFA) 108 (90 Base) MCG/ACT inhaler  Bronchitis - Plan: albuterol (VENTOLIN HFA) 108 (90 Base)  MCG/ACT inhaler  Anxiety - Plan: sertraline (ZOLOFT) 100 MG tablet, ALPRAZolam (XANAX) 0.25 MG tablet Replika, bloom, headspace, calm, insight timer meditation apps Aromatherapy lavender/eucalytus    Lateral epicondylitis of right elbow Counter force brace Consider ortho in future  rx voltaren gel  Pseudotumor cerebri F/u [facility] neurology/ent and opth  Gastroesophageal reflux disease pepcid 20 mg qd helps  Fatty liver twinrix 3rd dose today   Intractable migraine without status migrainosus, unspecified migraine type F/u neuro Aimovig for migraines  Nurtec  Ubrelvy  Magnesium 250-400 mg daily for migraine prevention    Essential hypertension - Plan: Comprehensive metabolic panel, Lipid panel, CBC with Differential/Platelet Cont meds   Prediabetes - Plan: Hemoglobin A1c   hm Fasting labs future  Declines flu shot Tdap had 01/12/16  rectwinrixvaccine with fatty liver hx3/3 given today   HIV 01/04/16 neg  -had pap 04/23/18 neg pap neg HPV -ob/ygn Dr. Kenton Kingfisher    Provider: Dr. [name] McLean-Scocuzza-Internal Medicine

## 2020-03-29 ENCOUNTER — Telehealth: Payer: Self-pay | Admitting: Internal Medicine

## 2020-03-29 DIAGNOSIS — K219 Gastro-esophageal reflux disease without esophagitis: Secondary | ICD-10-CM | POA: Insufficient documentation

## 2020-03-29 NOTE — Telephone Encounter (Signed)
Pt seen in office yesterday and left behind her AVS and hand written script for a right arm brace. Pt would like these mailed to her. She will call us back next Wednesday if she has not gotten them.   Mailed

## 2020-03-30 ENCOUNTER — Encounter: Payer: Self-pay | Admitting: Obstetrics and Gynecology

## 2020-03-30 ENCOUNTER — Other Ambulatory Visit: Payer: Self-pay

## 2020-03-30 ENCOUNTER — Ambulatory Visit (INDEPENDENT_AMBULATORY_CARE_PROVIDER_SITE_OTHER): Payer: BC Managed Care – PPO | Admitting: Obstetrics and Gynecology

## 2020-03-30 VITALS — BP 118/72 | Ht 66.0 in | Wt 273.0 lb

## 2020-03-30 DIAGNOSIS — B379 Candidiasis, unspecified: Secondary | ICD-10-CM

## 2020-03-30 DIAGNOSIS — N9089 Other specified noninflammatory disorders of vulva and perineum: Secondary | ICD-10-CM

## 2020-03-30 DIAGNOSIS — N909 Noninflammatory disorder of vulva and perineum, unspecified: Secondary | ICD-10-CM | POA: Diagnosis not present

## 2020-03-30 MED ORDER — FLUCONAZOLE 150 MG PO TABS: 150.0000 mg | ORAL_TABLET | ORAL | 2 refills | Status: AC

## 2020-03-30 MED ORDER — SULFAMETHOXAZOLE-TRIMETHOPRIM 800-160 MG PO TABS: 1.0000 | ORAL_TABLET | Freq: Two times a day (BID) | ORAL | 0 refills | Status: AC

## 2020-03-30 NOTE — Telephone Encounter (Signed)
You can work her into the schedule. I don't currently have an 11:30

## 2020-03-30 NOTE — Telephone Encounter (Signed)
Patient is schedule at 2:10 with CRS

## 2020-03-30 NOTE — Progress Notes (Signed)
Patient ID: Debbie Townsend, female   DOB: 01/22/1986, 34 y.o.   MRN: [id number]  Reason for Consult: Recurrent Skin Infections   Referred by McLean-Scocuzza, [name] *  Subjective:     HPI:  Debbie Townsend is a 34 y.o. female.  She reports that she has had continuing issues with her bottom.  She has an area on her left buttock cheek which has been coming and going for several months.  She reports that it did not improve or resolve with treatment with Valtrex.  She has difficulty identifying any things that help or improve the lesion.  She has not yet tried antibiotics.   Past Medical History:  Diagnosis Date  . Anxiety   . BRCA negative   . Depression   . Family history of anesthesia complication     mother had Post -op nausea and dizziness.  . Foot drop, left foot   . GERD (gastroesophageal reflux disease)   . Headache   . History of kidney stones   . Hypertension   . Kidney stone   . Pseudotumor cerebri 2007 or 2009   Family History  Problem Relation Age of Onset  . Cancer Mother        breast dx'ed age 31/49  . Hypertension Mother   . Diabetes Mother   . Hypothyroidism Mother   . Depression Mother   . Breast cancer Mother        75s  . Liver disease Father   . Heart attack Father   . Arthritis Father   . Alcohol abuse Father   . Depression Father   . Drug abuse Father   . Early death Father   . Heart disease Father   . Hypertension Father   . Depression Sister   . Hypertension Sister   . Arthritis Sister   . Hypertension Sister   . Cancer Maternal Grandmother        stomach>liver>brain met  . Cancer Maternal Uncle        lung not a smoker    Past Surgical History:  Procedure Laterality Date  . CESAREAN SECTION     2009/2017  . CHOLECYSTECTOMY N/A 02/04/2019   Procedure: LAPAROSCOPIC CHOLECYSTECTOMY;  Surgeon: Fredirick Maudlin, MD;  Location: [facility] ORS;  Service: General;  Laterality: N/A;  . LUMBAR LAMINECTOMY/DECOMPRESSION MICRODISCECTOMY Left  09/22/2014   Procedure: Left Lumbar four-five microdiskectomy;  Surgeon: Consuella Lose, MD;  Location: [place] [facility];  Service: Neurosurgery;  Laterality: Left;  Left Lumbar four-five microdiskectomy  . WISDOM TOOTH EXTRACTION      Short Social History:  Social History   Tobacco Use  . Smoking status: Current Every Day Smoker    Packs/day: 0.50    Types: Cigarettes  . Smokeless tobacco: Never Used  Substance Use Topics  . Alcohol use: Yes    Allergies  Allergen Reactions  . Apple Other (See Comments)    Lips itch and swell, inside of mouth gets hives  . Carrot [Daucus Carota] Other (See Comments)    Lips itch and swell, inside of mouth gets hives  . Monistat [Miconazole] Swelling    Severe vaginal itching and swelling  . Other Itching and Swelling    Almonds-mouth swells and itches  . Almond Oil Hives  . Amoxicillin Hives, Itching, Swelling and Dermatitis    Did it involve swelling of the face/tongue/throat, SOB, or low BP? No Did it involve sudden or severe rash/hives, skin peeling, or any reaction on the inside of your  mouth or nose? Yes Did you need to seek medical attention at a hospital or doctor's office? Yes When did it last happen?within the past 5 years If all above answers are "NO", may proceed with cephalosporin use.   . Imitrex [Sumatriptan]     Felt like heart attack     Current Outpatient Medications  Medication Sig Dispense Refill  . acetaZOLAMIDE (DIAMOX) 500 MG capsule Take 1,000 mg by mouth in the morning.    Marland Kitchen albuterol (VENTOLIN HFA) 108 (90 Base) MCG/ACT inhaler Inhale 1-2 puffs into the lungs every 4 (four) hours as needed for wheezing or shortness of breath. 1 g 12  . ALPRAZolam (XANAX) 0.25 MG tablet Take 1 tablet (0.25 mg total) by mouth daily as needed for anxiety. 30 tablet 0  . cyclobenzaprine (FLEXERIL) 5 MG tablet Take 1-2 tablets (5-10 mg total) by mouth at bedtime as needed for muscle spasms. 60 tablet 2  . famotidine (PEPCID) 20 MG  tablet Take 20 mg by mouth daily.    Marland Kitchen Galcanezumab-gnlm (EMGALITY) 120 MG/ML SOAJ Inject into the skin.    Marland Kitchen levocetirizine (XYZAL) 5 MG tablet Take 5 mg by mouth every evening.    Marland Kitchen lisinopril (ZESTRIL) 20 MG tablet Take 1 tablet (20 mg total) by mouth daily. 90 tablet 3  . sertraline (ZOLOFT) 100 MG tablet Take 1 tablet (100 mg total) by mouth daily. 90 tablet 3   No current facility-administered medications for this visit.    Review of Systems  Constitutional: Negative for chills, fatigue, fever and unexpected weight change.  HENT: Negative for trouble swallowing.  Eyes: Negative for loss of vision.  Respiratory: Negative for cough, shortness of breath and wheezing.  Cardiovascular: Negative for chest pain, leg swelling, palpitations and syncope.  GI: Negative for abdominal pain, blood in stool, diarrhea, nausea and vomiting.  GU: Negative for difficulty urinating, dysuria, frequency and hematuria.  Musculoskeletal: Negative for back pain, leg pain and joint pain.  Skin: Negative for rash.  Neurological: Negative for dizziness, headaches, light-headedness, numbness and seizures.  Psychiatric: Negative for behavioral problem, confusion, depressed mood and sleep disturbance.        Objective:  Objective   Vitals:   03/30/20 1356  BP: 118/72  Weight: 273 lb (123.8 kg)  Height: '5\' 6"'  (1.676 m)   Body mass index is 44.06 kg/m.  Physical Exam Vitals and nursing note reviewed.  Constitutional:      Appearance: She is well-developed.  HENT:     Head: Normocephalic and atraumatic.  Eyes:     Pupils: Pupils are equal, round, and reactive to light.  Cardiovascular:     Rate and Rhythm: Normal rate and regular rhythm.  Pulmonary:     Effort: Pulmonary effort is normal. No respiratory distress.  Genitourinary:    Comments: See photo Skin:    General: Skin is warm and dry.  Neurological:     Mental Status: She is alert and oriented to person, place, and time.  Psychiatric:         Behavior: Behavior normal.        Thought Content: Thought content normal.        Judgment: Judgment normal.             Assessment/Plan:     34 year old with persistent lesion on left buttocks.  Possibly a boil although there is not appear to be a fluctuant area or anything that could be drained.  Will treat patient with oral antibiotics.  She request  Diflucan since she generally has yeast infections after taking antibiotics.  We will plan to follow-up with the patient in 2 weeks.  If lesion does not resolve would recommended consultation with dermatology and/or general surgery.  More than 15 minutes were spent face to face with the patient in the room, reviewing the medical record, labs and images, and coordinating care for the patient. The plan of management was discussed in detail and counseling was provided.     Adrian Prows MD [facility], [facility] 03/30/2020 2:26 PM

## 2020-03-31 ENCOUNTER — Ambulatory Visit: Payer: BC Managed Care – PPO | Admitting: Obstetrics and Gynecology

## 2020-04-04 ENCOUNTER — Telehealth: Payer: Self-pay | Admitting: Internal Medicine

## 2020-04-04 DIAGNOSIS — G43019 Migraine without aura, intractable, without status migrainosus: Secondary | ICD-10-CM | POA: Diagnosis not present

## 2020-04-04 DIAGNOSIS — G9601 Cranial cerebrospinal fluid leak, spontaneous: Secondary | ICD-10-CM | POA: Diagnosis not present

## 2020-04-04 DIAGNOSIS — G932 Benign intracranial hypertension: Secondary | ICD-10-CM | POA: Diagnosis not present

## 2020-04-04 NOTE — Telephone Encounter (Signed)
Faxed Prescriber Response form faxed to [facility] on 04/04/20 Faxed to [phone]

## 2020-04-07 ENCOUNTER — Other Ambulatory Visit (INDEPENDENT_AMBULATORY_CARE_PROVIDER_SITE_OTHER): Payer: BC Managed Care – PPO

## 2020-04-07 ENCOUNTER — Other Ambulatory Visit: Payer: Self-pay | Admitting: Internal Medicine

## 2020-04-07 ENCOUNTER — Encounter (INDEPENDENT_AMBULATORY_CARE_PROVIDER_SITE_OTHER): Payer: BC Managed Care – PPO | Admitting: Internal Medicine

## 2020-04-07 ENCOUNTER — Other Ambulatory Visit: Payer: Self-pay

## 2020-04-07 DIAGNOSIS — I10 Essential (primary) hypertension: Secondary | ICD-10-CM

## 2020-04-07 DIAGNOSIS — L237 Allergic contact dermatitis due to plants, except food: Secondary | ICD-10-CM | POA: Diagnosis not present

## 2020-04-07 DIAGNOSIS — R7303 Prediabetes: Secondary | ICD-10-CM

## 2020-04-07 LAB — COMPREHENSIVE METABOLIC PANEL
ALT: 23 U/L (ref 0–35)
AST: 15 U/L (ref 0–37)
Albumin: 4 g/dL (ref 3.5–5.2)
Alkaline Phosphatase: 50 U/L (ref 39–117)
BUN: 12 mg/dL (ref 6–23)
CO2: 25 mEq/L (ref 19–32)
Calcium: 8.8 mg/dL (ref 8.4–10.5)
Chloride: 105 mEq/L (ref 96–112)
Creatinine, Ser: 0.8 mg/dL (ref 0.40–1.20)
GFR: 81.85 mL/min (ref >60.00)
Glucose, Bld: 115 mg/dL — ABNORMAL HIGH (ref 70–99)
Potassium: 3.6 mEq/L (ref 3.5–5.1)
Sodium: 137 mEq/L (ref 135–145)
Total Bilirubin: 0.5 mg/dL (ref 0.2–1.2)
Total Protein: 6.9 g/dL (ref 6.0–8.3)

## 2020-04-07 LAB — CBC WITH DIFFERENTIAL/PLATELET
Basophils Absolute: 0.1 10*3/uL (ref 0.0–0.1)
Basophils Relative: 0.7 % (ref 0.0–3.0)
Eosinophils Absolute: 0.2 10*3/uL (ref 0.0–0.7)
Eosinophils Relative: 2.2 % (ref 0.0–5.0)
HCT: 42.1 % (ref 36.0–46.0)
Hemoglobin: 14 g/dL (ref 12.0–15.0)
Lymphocytes Relative: 19.6 % (ref 12.0–46.0)
Lymphs Abs: 1.7 10*3/uL (ref 0.7–4.0)
MCHC: 33.2 g/dL (ref 30.0–36.0)
MCV: 83.5 fl (ref 78.0–100.0)
Monocytes Absolute: 0.5 10*3/uL (ref 0.1–1.0)
Monocytes Relative: 6 % (ref 3.0–12.0)
Neutro Abs: 6.1 10*3/uL (ref 1.4–7.7)
Neutrophils Relative %: 71.5 % (ref 43.0–77.0)
Platelets: 286 10*3/uL (ref 150.0–400.0)
RBC: 5.04 Mil/uL (ref 3.87–5.11)
RDW: 14.4 % (ref 11.5–15.5)
WBC: 8.5 10*3/uL (ref 4.0–10.5)

## 2020-04-07 LAB — LIPID PANEL
Cholesterol: 133 mg/dL (ref 0–200)
HDL: 37.7 mg/dL — ABNORMAL LOW (ref >39.00)
LDL Cholesterol: 75 mg/dL (ref 0–99)
NonHDL: 94.82
Total CHOL/HDL Ratio: 4
Triglycerides: 97 mg/dL (ref 0.0–149.0)
VLDL: 19.4 mg/dL (ref 0.0–40.0)

## 2020-04-07 LAB — HEMOGLOBIN A1C: Hgb A1c MFr Bld: 6 % (ref 4.6–6.5)

## 2020-04-07 MED ORDER — PREDNISONE 20 MG PO TABS: 40.0000 mg | ORAL_TABLET | Freq: Every day | ORAL | 0 refills | Status: DC

## 2020-04-07 MED ORDER — TRIAMCINOLONE ACETONIDE 0.1 % EX CREA: 1.0000 "application " | TOPICAL_CREAM | Freq: Two times a day (BID) | CUTANEOUS | 1 refills | Status: DC

## 2020-04-07 NOTE — Telephone Encounter (Signed)
These pics aren't the best but it'Townsend the best I can do right now. I have more on the inside of my thigh. I also have spots popping up on my arm and my stomach. I have been doing Benadryl gel/cream and calamine lotion.     You  Debbie Townsend 8 hours ago (8:42 AM)  TM Ok will review the photo and determine the best treatment   [place]   Debbie Townsend, Debbie Townsend  You 9 hours ago (8:24 AM)   Yes I agree. I'm actually here in the office for blood work this morning. I can send a pic when I get to work.   You  Debbie Townsend, Debbie Townsend 9 hours ago (8:21 AM)  TM If you are agreeable to my chart consult fee yes we can help Are you agreeable to fee?  We also need a photo attached via my chart  [place]   Debbie Townsend, Debbie Townsend  You 9 hours ago (8:20 AM)   Does patient need a mychart consultation and to send in a picture?    Routing comment   Debbie Townsend, Debbie Townsend  You 9 hours ago (8:00 AM)   Hey there - I have had poison oak since right after my appt with you. It'Townsend on my thigh near my vagina and I'm scared it'Townsend going to get in my vagina. Can you call me in a steroid? Thanks!  A/p  C/w poison ivy  [facility] Cream and prednisone  Cont calamine and benadyl prn Agreeable to fee  Time spent 5-10 minutes  [place]

## 2020-04-13 DIAGNOSIS — G932 Benign intracranial hypertension: Secondary | ICD-10-CM | POA: Diagnosis not present

## 2020-04-17 ENCOUNTER — Ambulatory Visit: Payer: BC Managed Care – PPO | Admitting: Obstetrics and Gynecology

## 2020-04-18 DIAGNOSIS — G9601 Cranial cerebrospinal fluid leak, spontaneous: Secondary | ICD-10-CM | POA: Diagnosis not present

## 2020-04-18 DIAGNOSIS — J3089 Other allergic rhinitis: Secondary | ICD-10-CM | POA: Diagnosis not present

## 2020-04-18 DIAGNOSIS — G932 Benign intracranial hypertension: Secondary | ICD-10-CM | POA: Diagnosis not present

## 2020-04-18 DIAGNOSIS — J3489 Other specified disorders of nose and nasal sinuses: Secondary | ICD-10-CM | POA: Diagnosis not present

## 2020-04-25 NOTE — Telephone Encounter (Signed)
Do you recommend anyone else

## 2020-05-17 ENCOUNTER — Other Ambulatory Visit: Payer: Self-pay | Admitting: Obstetrics & Gynecology

## 2020-05-17 DIAGNOSIS — N909 Noninflammatory disorder of vulva and perineum, unspecified: Secondary | ICD-10-CM

## 2020-05-17 NOTE — Telephone Encounter (Signed)
Referral Dr Lilly [place], [facility] requested for within 30 days

## 2020-06-01 DIAGNOSIS — L0231 Cutaneous abscess of buttock: Secondary | ICD-10-CM | POA: Diagnosis not present

## 2020-06-30 ENCOUNTER — Ambulatory Visit (INDEPENDENT_AMBULATORY_CARE_PROVIDER_SITE_OTHER): Payer: BC Managed Care – PPO | Admitting: Internal Medicine

## 2020-06-30 ENCOUNTER — Encounter: Payer: Self-pay | Admitting: Internal Medicine

## 2020-06-30 ENCOUNTER — Other Ambulatory Visit: Payer: Self-pay

## 2020-06-30 VITALS — BP 110/82 | HR 85 | Temp 98.3°F | Ht 66.0 in | Wt 275.2 lb

## 2020-06-30 DIAGNOSIS — R400 Somnolence: Secondary | ICD-10-CM

## 2020-06-30 DIAGNOSIS — L659 Nonscarring hair loss, unspecified: Secondary | ICD-10-CM | POA: Diagnosis not present

## 2020-06-30 DIAGNOSIS — Z1389 Encounter for screening for other disorder: Secondary | ICD-10-CM

## 2020-06-30 DIAGNOSIS — Z13220 Encounter for screening for lipoid disorders: Secondary | ICD-10-CM

## 2020-06-30 DIAGNOSIS — G8929 Other chronic pain: Secondary | ICD-10-CM

## 2020-06-30 DIAGNOSIS — Z13818 Encounter for screening for other digestive system disorders: Secondary | ICD-10-CM

## 2020-06-30 DIAGNOSIS — M545 Low back pain, unspecified: Secondary | ICD-10-CM

## 2020-06-30 DIAGNOSIS — R5383 Other fatigue: Secondary | ICD-10-CM | POA: Diagnosis not present

## 2020-06-30 DIAGNOSIS — R635 Abnormal weight gain: Secondary | ICD-10-CM

## 2020-06-30 DIAGNOSIS — F339 Major depressive disorder, recurrent, unspecified: Secondary | ICD-10-CM

## 2020-06-30 DIAGNOSIS — E049 Nontoxic goiter, unspecified: Secondary | ICD-10-CM

## 2020-06-30 DIAGNOSIS — G43909 Migraine, unspecified, not intractable, without status migrainosus: Secondary | ICD-10-CM

## 2020-06-30 DIAGNOSIS — R7303 Prediabetes: Secondary | ICD-10-CM

## 2020-06-30 DIAGNOSIS — G932 Benign intracranial hypertension: Secondary | ICD-10-CM

## 2020-06-30 DIAGNOSIS — F419 Anxiety disorder, unspecified: Secondary | ICD-10-CM

## 2020-06-30 DIAGNOSIS — I10 Essential (primary) hypertension: Secondary | ICD-10-CM

## 2020-06-30 DIAGNOSIS — Z1159 Encounter for screening for other viral diseases: Secondary | ICD-10-CM

## 2020-06-30 DIAGNOSIS — E611 Iron deficiency: Secondary | ICD-10-CM

## 2020-06-30 DIAGNOSIS — Z6841 Body Mass Index (BMI) 40.0 and over, adult: Secondary | ICD-10-CM

## 2020-06-30 DIAGNOSIS — K760 Fatty (change of) liver, not elsewhere classified: Secondary | ICD-10-CM

## 2020-06-30 NOTE — Progress Notes (Signed)
Patient flagged: Current status:  PATIENT IS OVERDUE FOR BMI FOLLOW UP PLAN BMI is estimated to be 44.4 based on the last recorded weight and height

## 2020-06-30 NOTE — Progress Notes (Addendum)
Chief Complaint  Patient presents with  . Follow-up   Fu  1. Migraines did not resolve on emgality but amovig is helping  2. C/o fatigue, hair loss & dry hair and weight gain with h/o thyroid d/o in her family reviewed last thyroid labs normal but will repeat again. She is not currently exercising weight age 34 y.o was 180 lbs she had adipex 4 years ago and weight went from 275 to 200 then she gained it back to 230 after kids  Adipex likely not recommended given h/o #5   3. Back pain 5/10 today chronic low back and sees chiropractor  4. Recurrent anxiety/depression GAD 7 score 10 today on zoloft 100 mg qd, xanax 0.25 prn  5. ICH, pseudotumor cerebri Neurology, eye, ENT [facility] rec wt loss surgery for pt but she is hesitant for now and declines they want to wean her off diamox 500 mg  6. HTN controlled on lis 20 mg qd   Review of Systems  Constitutional: Positive for malaise/fatigue. Negative for weight loss.  HENT: Negative for hearing loss.   Eyes: Negative for blurred vision.  Respiratory: Negative for shortness of breath.   Cardiovascular: Negative for chest pain.  Gastrointestinal: Negative for abdominal pain.  Musculoskeletal: Negative for falls.  Skin: Negative for rash.       +dry hair and hair loss   Neurological: Negative for headaches.  Psychiatric/Behavioral: Negative for depression. The patient is nervous/anxious.        Anxiety about hair loss and weight    Past Medical History:  Diagnosis Date  . Anxiety   . BRCA negative   . Depression   . Family history of anesthesia complication     mother had Post -op nausea and dizziness.  . Foot drop, left foot   . GERD (gastroesophageal reflux disease)   . Headache   . History of kidney stones   . Hypertension   . Kidney stone   . Pseudotumor cerebri 2007 or 2009   Past Surgical History:  Procedure Laterality Date  . CESAREAN SECTION     2009/2017  . CHOLECYSTECTOMY N/A 02/04/2019   Procedure: LAPAROSCOPIC  CHOLECYSTECTOMY;  Surgeon: Fredirick Maudlin, MD;  Location: [facility] ORS;  Service: General;  Laterality: N/A;  . LUMBAR LAMINECTOMY/DECOMPRESSION MICRODISCECTOMY Left 09/22/2014   Procedure: Left Lumbar four-five microdiskectomy;  Surgeon: Consuella Lose, MD;  Location: [facility];  Service: Neurosurgery;  Laterality: Left;  Left Lumbar four-five microdiskectomy  . WISDOM TOOTH EXTRACTION     Family History  Problem Relation Age of Onset  . Cancer Mother        breast dx'ed age 22/49  . Hypertension Mother   . Diabetes Mother   . Hypothyroidism Mother   . Depression Mother   . Breast cancer Mother        86s  . Liver disease Father   . Heart attack Father   . Arthritis Father   . Alcohol abuse Father   . Depression Father   . Drug abuse Father   . Early death Father   . Heart disease Father   . Hypertension Father   . Depression Sister   . Hypertension Sister   . Arthritis Sister   . Hypertension Sister   . Cancer Maternal Grandmother        stomach>liver>brain met  . Cancer Maternal Uncle        lung not a smoker    Social History   Socioeconomic History  . Marital status:  Married    Spouse name: Not on file  . Number of children: 2  . Years of education: Not on file  . Highest education level: Not on file  Occupational History  . Not on file  Tobacco Use  . Smoking status: Former Smoker    Packs/day: 0.50    Types: Cigarettes  . Smokeless tobacco: Never Used  Vaping Use  . Vaping Use: Never used  Substance and Sexual Activity  . Alcohol use: Yes  . Drug use: No  . Sexual activity: Yes    Birth control/protection: Rhythm  Other Topics Concern  . 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 Difficulty of Paying Living Expenses:   Food Insecurity:    . Worried About Charity fundraiser in the Last Year:   . Arboriculturist in the Last Year:   Transportation Needs:   . Film/video editor (Medical):   Marland Kitchen Lack of Transportation (Non-Medical):   Physical Activity:   . Days of Exercise per Week:   . Minutes of Exercise per Session:   Stress:   . Feeling of Stress :   Social Connections:   . Frequency of Communication with Friends and Family:   . Frequency of Social Gatherings with Friends and Family:   . Attends Religious Services:   . Active Member of Clubs or Organizations:   . Attends Archivist Meetings:   Marland Kitchen Marital Status:   Intimate Partner Violence:   . Fear of Current or Ex-Partner:   . Emotionally Abused:   Marland Kitchen Physically Abused:   . Sexually Abused:    Current Meds  Medication Sig  . acetaZOLAMIDE (DIAMOX) 500 MG capsule Take 1,000 mg by mouth in the morning.  Marland Kitchen AIMOVIG 140 MG/ML SOAJ SMARTSIG:140 Milligram(s) SUB-Q Once a Month  . albuterol (VENTOLIN HFA) 108 (90 Base) MCG/ACT inhaler Inhale 1-2 puffs into the lungs every 4 (four) hours as needed for wheezing or shortness of breath.  . famotidine (PEPCID) 20 MG tablet Take 20 mg by mouth daily.  Marland Kitchen lisinopril (ZESTRIL) 20 MG tablet Take 1 tablet (20 mg total) by mouth daily.  . sertraline (ZOLOFT) 100 MG tablet Take 1 tablet (100 mg total) by mouth daily.  Marland Kitchen triamcinolone cream (KENALOG) 0.1 % Apply 1 application topically 2 (two) times daily. Thigh and arm as needed   Allergies  Allergen Reactions  . Apple Other (See Comments)    Lips itch and swell, inside of mouth gets hives  . Carrot [Daucus Carota] Other (See Comments)    Lips itch and swell, inside of mouth gets hives  . Monistat [Miconazole] Swelling    Severe vaginal itching and swelling  . Other Itching and Swelling    Almonds-mouth swells and itches  . Almond Oil Hives  . Amoxicillin Hives, Itching, Swelling and Dermatitis    Did it involve swelling of the face/tongue/throat, SOB, or low  BP? No Did it involve sudden or severe rash/hives, skin peeling, or any reaction on the inside of your mouth or nose? Yes Did you need to seek medical attention at a hospital or doctor's office? Yes When did it last happen?within the past 5 years  If all above answers are "NO", may proceed with cephalosporin use.   . Imitrex [Sumatriptan]     Felt like heart attack   . Topamax [Topiramate]     Memory loss     Recent Results (from the past 2160 hour(s))  Hemoglobin A1c     Status: None   Collection Time: 04/07/20  8:22 AM  Result Value Ref Range   Hgb A1c MFr Bld 6.0 4.6 - 6.5 %    Comment: Glycemic Control Guidelines for People with Diabetes:Non Diabetic:  <6%Goal of Therapy: <7%Additional Action Suggested:  >8%   CBC with Differential/Platelet     Status: None   Collection Time: 04/07/20  8:22 AM  Result Value Ref Range   WBC 8.5 4.0 - 10.5 K/uL   RBC 5.04 3.87 - 5.11 Mil/uL   Hemoglobin 14.0 12.0 - 15.0 g/dL   HCT 42.1 36 - 46 %   MCV 83.5 78.0 - 100.0 fl   MCHC 33.2 30.0 - 36.0 g/dL   RDW 14.4 11.5 - 15.5 %   Platelets 286.0 150 - 400 K/uL   Neutrophils Relative % 71.5 43 - 77 %   Lymphocytes Relative 19.6 12 - 46 %   Monocytes Relative 6.0 3 - 12 %   Eosinophils Relative 2.2 0 - 5 %   Basophils Relative 0.7 0 - 3 %   Neutro Abs 6.1 1.4 - 7.7 K/uL   Lymphs Abs 1.7 0.7 - 4.0 K/uL   Monocytes Absolute 0.5 0 - 1 K/uL   Eosinophils Absolute 0.2 0 - 0 K/uL   Basophils Absolute 0.1 0 - 0 K/uL  Lipid panel     Status: Abnormal   Collection Time: 04/07/20  8:22 AM  Result Value Ref Range   Cholesterol 133 0 - 200 mg/dL    Comment: ATP III Classification       Desirable:  < 200 mg/dL               Borderline High:  200 - 239 mg/dL          High:  > = 240 mg/dL   Triglycerides 97.0 0 - 149 mg/dL    Comment: Normal:  <150 mg/dLBorderline High:  150 - 199 mg/dL   HDL 37.70 (L) >39.00 mg/dL   VLDL 19.4 0.0 - 40.0 mg/dL   LDL Cholesterol 75 0 - 99 mg/dL   Total CHOL/HDL  Ratio 4     Comment:                Men          Women1/2 Average Risk     3.4          3.3Average Risk          5.0          4.42X Average Risk          9.6          7.13X Average Risk          15.0          11.0                       NonHDL 94.82     Comment: NOTE:  Non-HDL goal should be 30 mg/dL higher than patient's LDL goal (i.e. LDL goal of < 70 mg/dL, would have non-HDL goal of < 100 mg/dL)  Comprehensive metabolic panel     Status: Abnormal   Collection Time:  04/07/20  8:22 AM  Result Value Ref Range   Sodium 137 135 - 145 mEq/L   Potassium 3.6 3.5 - 5.1 mEq/L   Chloride 105 96 - 112 mEq/L   CO2 25 19 - 32 mEq/L   Glucose, Bld 115 (H) 70 - 99 mg/dL   BUN 12 6 - 23 mg/dL   Creatinine, Ser 0.80 0.40 - 1.20 mg/dL   Total Bilirubin 0.5 0.2 - 1.2 mg/dL   Alkaline Phosphatase 50 39 - 117 U/L   AST 15 0 - 37 U/L   ALT 23 0 - 35 U/L   Total Protein 6.9 6.0 - 8.3 g/dL   Albumin 4.0 3.5 - 5.2 g/dL   GFR 81.85 >60.00 mL/min   Calcium 8.8 8.4 - 10.5 mg/dL   Objective  Body mass index is 44.42 kg/m. Wt Readings from Last 3 Encounters:  06/30/20 275 lb 3.2 oz (124.8 kg)  03/30/20 273 lb (123.8 kg)  03/28/20 271 lb 12.8 oz (123.3 kg)   Temp Readings from Last 3 Encounters:  06/30/20 98.3 F (36.8 C) (Oral)  03/28/20 98.2 F (36.8 C) (Temporal)  09/24/19 98.6 F (37 C) (Oral)   BP Readings from Last 3 Encounters:  06/30/20 110/82  03/30/20 118/72  03/28/20 112/80   Pulse Readings from Last 3 Encounters:  06/30/20 85  03/28/20 82  09/24/19 72    Physical Exam Vitals and nursing note reviewed.  Constitutional:      Appearance: Normal appearance. She is well-developed and well-groomed. She is morbidly obese.  HENT:     Head: Normocephalic and atraumatic.  Eyes:     Conjunctiva/sclera: Conjunctivae normal.     Pupils: Pupils are equal, round, and reactive to light.  Cardiovascular:     Rate and Rhythm: Normal rate and regular rhythm.     Heart sounds: Normal heart  sounds. No murmur heard.   Pulmonary:     Effort: Pulmonary effort is normal.     Breath sounds: Normal breath sounds.  Skin:    General: Skin is warm and dry.  Neurological:     General: No focal deficit present.     Mental Status: She is alert and oriented to person, place, and time. Mental status is at baseline.     Gait: Gait normal.  Psychiatric:        Attention and Perception: Attention and perception normal.        Mood and Affect: Mood and affect normal.        Speech: Speech normal.        Behavior: Behavior normal. Behavior is cooperative.        Thought Content: Thought content normal.        Cognition and Memory: Cognition and memory normal.        Judgment: Judgment normal.     Assessment  Plan  Fatigue, unspecified type - Plan: Comprehensive metabolic panel, CBC with Differential/Platelet, TSH, T4, free, Urinalysis, Routine w reflex microscopic Labs sch 10/2020 Consider sleep study with neurology    Iron deficiency - Plan: Iron, TIBC and Ferritin Panel  Hair loss Ask dermatology about hair loss Dr. Ubaldo Glassing in [place] Consider womens or mens rogaine have to keep using  [name] shampoo and conditioner  -target  Skin hair and nail vitamin and or biotin  Biosil drops for skin hair and nail  Vital proteins collagen skin for hair nails strawberry powder    Weight gain, morbid obesity - Plan: Comprehensive metabolic panel, TSH, T4,  free Disc Wegovy up to 2.4 titrate max dose to consider check with insurance  rec healthy diet and exercise  Not candidate for adipex any long given ICH  Prediabetes - Plan: Hemoglobin A1c   Fatty liver - Plan: Hepatitis B surface antibody,quantitative, Hepatitis A Ab, Total Had twinrix 3/3 doses    Migraine without status migrainosus, not intractable, unspecified migraine type amovig is helping previously emgality did not help   Chronic midline low back pain, unspecified whether sciatica present F/u chiropractor    Anxiety Depression, recurrent ([place]) -cont meds   Intracranial hypertension Pseudotumor cerebri F/u [facility] neurology they rec wt loss surgery   Essential hypertension  Cont meds and monitor BP   ?excess skin to neck r/o thyroid goiter FH significant thyroid dz immediate relatives  Will do US thyroid to work up   Hm-CPE after 09/24/2019 Fasting labs future 10/2020  Declines flu shot Tdap had 01/12/16  rectwinrixvaccine with fatty liver had 4/13/213rd dose check titer declines covid19 vaccine   HIV 01/04/16 neg  -had pap 04/23/18 neg pap neg HPV -ob/ygn Dr. Ernestine Mcmurray neurology 07/05/20 and Dr. Manuella Ghazi [facility] 07/13/20  [facility] 08/23/20 Dermatology Dr. Ubaldo Glassing in [place]   Provider: Dr. [name] McLean-Scocuzza-[facility]

## 2020-06-30 NOTE — Patient Instructions (Addendum)
07/05/2020 Office Visit Neurology Debbie Townsend, Debbie Townsend, [place] [address]  [phone]  [phone] ([phone]    07/13/2020 Office Visit Ophthalmology [place], Debbie Townsend, [name], [place] [address]  [phone]  [phone] (Fax)    08/23/2020 Office Visit Otolaryngology Debbie Townsend, Debbie Ala, MD  [place], [place] [address]  [phone]  [phone] (Fax)     Weight loss Wegovy 1x per week 2.8 mg weekly injection -check with insurance   Consider sleep study   Ask dermatology about hair loss  Consider womens or mens rogaine have to keep using  [name] shampoo and conditioner  -target   Skin hair and nail vitamin and or biotin  Biosil drops for skin hair and nail  Vital proteins collagen skin for hair nails strawberry powder    Results for Debbie Townsend, Debbie Townsend (MRN [id number]) as of 06/30/2020 15:19  Ref. Range 09/24/2019 08:39  TSH Latest Ref Range: 0.35 - 4.50 uIU/mL 2.72      Exercising to Lose Weight Exercise is structured, repetitive physical activity to improve fitness and health. Getting regular exercise is important for everyone. It is especially important if you are overweight. Being overweight increases your risk of heart disease, stroke, diabetes, high blood pressure, and several types of cancer. Reducing your calorie intake and exercising can help you lose weight. Exercise is usually categorized as moderate or vigorous intensity. To lose weight, [place] people need to do a certain amount of moderate-intensity or vigorous-intensity exercise each week. Moderate-intensity exercise  Moderate-intensity exercise is any activity that gets you moving enough to burn at least three times more energy (calories) than if you were sitting. Examples of moderate exercise include:  Walking a mile in 15 minutes.  Doing light yard work.  Biking at an easy pace. [place] people should get at least 150 minutes (2  hours and 30 minutes) a week of moderate-intensity exercise to maintain their body weight. Vigorous-intensity exercise Vigorous-intensity exercise is any activity that gets you moving enough to burn at least six times more calories than if you were sitting. When you exercise at this intensity, you should be working hard enough that you are not able to carry on a conversation. Examples of vigorous exercise include:  Running.  Playing a team sport, such as football, basketball, and soccer.  Jumping rope. [place] people should get at least 75 minutes (1 hour and 15 minutes) a week of vigorous-intensity exercise to maintain their body weight. How can exercise affect me? When you exercise enough to burn more calories than you eat, you lose weight. Exercise also reduces body fat and builds muscle. The more muscle you have, the more calories you burn. Exercise also:  Improves mood.  Reduces stress and tension.  Improves your overall fitness, flexibility, and endurance.  Increases bone strength. The amount of exercise you need to lose weight depends on:  Your age.  The type of exercise.  Any health conditions you have.  Your overall physical ability. Talk to your health care provider about how much exercise you need and what types of activities are safe for you. What actions can I take to lose weight? Nutrition   Make changes to your diet as told by your health care provider or diet and nutrition specialist (dietitian). This may include: ? Eating fewer calories. ? Eating more protein. ? Eating less unhealthy fats. ? Eating a diet that includes fresh fruits and vegetables, whole grains, low-fat dairy  products, and lean protein. ? Avoiding foods with added fat, salt, and sugar.  Drink plenty of water while you exercise to prevent dehydration or heat stroke. Activity  Choose an activity that you enjoy and set realistic goals. Your health care provider can help you make an exercise plan  that works for you.  Exercise at a moderate or vigorous intensity [place] days of the week. ? The intensity of exercise may vary from person to person. You can tell how intense a workout is for you by paying attention to your breathing and heartbeat. [place] people will notice their breathing and heartbeat get faster with more intense exercise.  Do resistance training twice each week, such as: ? Push-ups. ? Sit-ups. ? Lifting weights. ? Using resistance bands.  Getting short amounts of exercise can be just as helpful as long structured periods of exercise. If you have trouble finding time to exercise, try to include exercise in your daily routine. ? Get up, stretch, and walk around every 30 minutes throughout the day. ? Go for a walk during your lunch break. ? Park your car farther away from your destination. ? If you take public transportation, get off one stop early and walk the rest of the way. ? Make phone calls while standing up and walking around. ? Take the stairs instead of elevators or escalators.  Wear comfortable clothes and shoes with good support.  Do not exercise so much that you hurt yourself, feel dizzy, or get very short of breath. Where to find more information  U.S. Department of Health and Human Services: [employer].at  Centers for Disease Control and Prevention (CDC): http://www.wolf.info/ Contact a health care provider:  Before starting a new exercise program.  If you have questions or concerns about your weight.  If you have a medical problem that keeps you from exercising. Get help right away if you have any of the following while exercising:  Injury.  Dizziness.  Difficulty breathing or shortness of breath that does not go away when you stop exercising.  Chest pain.  Rapid heartbeat. Summary  Being overweight increases your risk of heart disease, stroke, diabetes, high blood pressure, and several types of cancer.  Losing weight happens when you burn more  calories than you eat.  Reducing the amount of calories you eat in addition to getting regular moderate or vigorous exercise each week helps you lose weight. This information is not intended to replace advice given to you by your health care provider. Make sure you discuss any questions you have with your health care provider. Document Revised: 12/15/2017 Document Reviewed: 12/15/2017 Elsevier Patient Education  [place] After This sheet gives you information about how to care for yourself after your procedure. Your health care provider may also give you more specific instructions. If you have problems or questions, contact your health care provider. What can I expect after the procedure? After the procedure, it is common to have:  Pain in the abdomen.  Decreased appetite.  Clear fluid leaking through the small tube (drain) that comes from your incision site. Follow these instructions at home: Medicines  Take over-the-counter and prescription medicines only as told by your health care provider.  Do not drive for 24 hours if you were given a sedative during your procedure.  Do not drive or use heavy machinery while taking prescription pain medicine. Incision and drain care   Follow instructions from your health care provider about how to take care of  your incisions. Make sure you: ? Wash your hands with soap and water before and after you change your bandage (dressing). If soap and water are not available, use hand sanitizer. ? Change your dressing as told by your health care provider. ? Leave stitches (sutures), skin glue, or adhesive strips in place. These skin closures may need to be in place for 2 weeks or longer. If adhesive strip edges start to loosen and curl up, you may trim the loose edges. Do not remove adhesive strips completely unless your health care provider tells you to do that.  Keep the area around your incisions and your drain  clean and dry.  Check your incision areas every day for signs of infection. Check for: ? Redness, swelling, or pain. ? Fluid or blood. ? Warmth. ? Pus or a bad smell.  Empty your drain every day. Follow instructions from your health care provider about recording the amount of fluid that comes from your drain. Make note of any changes in the amount or appearance of the fluid. Activity   Rest as told by your health care provider.  Avoid sitting for a long time without moving. Get up to take short walks every 1-2 hours. This is important to improve blood flow and breathing. Ask for help if you feel weak or unsteady.  Return to your normal activities as told by your health care provider. Ask your health care provider what activities are safe for you.  Do not lift anything that is heavier than 10 lb (4.5 kg), or the limit that you are told, until your health care provider says that it is safe.  Avoid intense physical activity for as long as told by your health care provider. Eating and drinking  Follow instructions from your health care provider about eating or drinking restrictions. You will be given instructions about the type, the size, and the timing of your meals. ? Keep track of any foods that cause discomfort, such as bloating or cramping. ? Eat healthy foods. Avoid foods that are high in fat or sugar.  Stop eating when you feel full.  Take supplements only as told by your health care provider.  Drink enough fluid to keep your urine pale yellow. General instructions  Do not take baths, swim, or use a hot tub until your health care provider approves. Ask your health care provider if you may take showers. You may only be allowed to take sponge baths.  Do not use any products that contain nicotine or tobacco, such as cigarettes, e-cigarettes, and chewing tobacco. If you need help quitting, ask your health care provider.  Wear compression stockings as told by your health care  provider. These stockings help to prevent blood clots and reduce swelling in your legs.  Do breathing exercises as told by your health care provider.  Keep all follow-up visits as told by your health care provider. This is important. Contact a health care provider if you have:  Pain that gets worse or does not get better with medicine.  Redness, swelling, or pain around your incisions.  Fluid or blood coming from your incisions.  Incisions that feel warm to the touch.  Pus or a bad smell coming from your incisions.  A fever or chills.  Problems with your drain.  Green or bad-smelling fluid leaking from your drain. Get help right away if you have:  Trouble breathing.  Severe pain, especially in your legs. Summary  After the procedure, it is common to have  pain in the abdomen, decreased appetite, and clear fluid leaking from the drain in an incision site.  Take over-the-counter and prescription medicines only as told by your health care provider.  Follow instructions from your health care provider about how to take care of your incisions. Report any signs of infection to your health care provider.  After surgery, get up to take short walks every 1-2 hours. This is important to improve blood flow and breathing. Follow instructions from your health care provider about eating or drinking restrictions.  Get help right away if you have trouble breathing or severe pain, especially in your legs. This information is not intended to replace advice given to you by your health care provider. Make sure you discuss any questions you have with your health care provider. Document Revised: 03/25/2019 Document Reviewed: 07/21/2018 Elsevier Patient Education  [place].  Endoscopic Sleeve Gastroplasty  Endoscopic sleeve gastroplasty is a weight loss (bariatric) procedure. It is done by inserting a long, flexible tube (endoscope) through the mouth and into the stomach. Through the  endoscope, the surgeon applies surgical staples or stitches (sutures) to make the stomach smaller. This procedure does not require any incisions. You may need this procedure if you are very overweight and have not been able to lose enough weight with diet and exercise. Your health care provider may recommend this procedure if other types of bariatric surgery are not safe for you. You are likely to lose weight after this surgery because your stomach will be smaller. You will need to work closely with your health care providers to maintain a healthy weight. Tell a health care provider about:  Any allergies you have.  All medicines you are taking, including vitamins, herbs, eye drops, creams, and over-the-counter medicines.  Any problems you or family members have had with anesthetic medicines.  Any blood disorders you have.  Any surgeries you have had.  Any medical conditions you have.  Whether you are pregnant or may be pregnant.  Any family history of stomach cancer.  Any stomach problems you have.  Any mental health problems you have. What are the risks? Generally, this is a safe procedure. However, problems may occur, including:  Stomach pain and nausea.  Infection.  Bleeding.  Allergic reactions to medicines or dyes.  Damage to the stomach or the part of the body that moves food from the mouth to the stomach (esophagus), such as a hole (perforation).  Stomach juices leaking outside of the stomach.  A blood clot that travels to a lung (pulmonary embolism). What happens before the procedure? Medicines  Ask your health care provider about: ? Changing or stopping your regular medicines. This is especially important if you are taking diabetes medicines or blood thinners. ? Taking medicines such as aspirin and ibuprofen. These medicines can thin your blood. Do not take these medicines unless your health care provider tells you to take them. ? Taking over-the-counter  medicines, vitamins, herbs, and supplements.  You may be prescribed antibiotic medicine. If so, take the medicine as told by your health care provider. Staying hydrated Follow instructions from your health care provider about hydration, which may include:  Up to 2 hours before the procedure - you may continue to drink clear liquids, such as water, clear fruit juice, black coffee, and plain tea. Eating and drinking restrictions Follow instructions from your health care provider about eating and drinking, which may include:  8 hours before the procedure - stop eating heavy meals or foods  such as meat, fried foods, or fatty foods.  6 hours before the procedure - stop eating light meals or foods, such as toast or cereal.  6 hours before the procedure - stop drinking milk or drinks that contain milk.  2 hours before the procedure - stop drinking clear liquids. General instructions  You may need to meet with a diet and nutrition specialist (dietitian) and a mental health professional to make sure you can commit to maintaining a healthy diet and a healthy weight for years after this procedure.  Plan to have someone take you home from the hospital or clinic.  Plan to have a responsible adult care for you for at least 24 hours after you leave the hospital or clinic. This is important. What happens during the procedure?  To lower your risk of infection, your health care team will wash or sanitize their hands.  An IV will be inserted into one of your veins.  You will be given one or more of the following: ? A medicine to make you fall asleep (general anesthetic). ? Antibiotics. ? Anti-nausea medicine.  You will be positioned so that you are lying on your left side.  An endoscope will be passed through your mouth, down your esophagus, and into your stomach.  An air-like gas will be sent through the endoscope to expand (inflate) your stomach. This will give your surgeon a clearer view  inside of your stomach and will make more room to do the procedure.  Your surgeon will use staples or sutures to make your stomach narrower and shorter. These will be applied using a tool at the end of the endoscope.  Your stomach will be washed out with an antibiotic solution.  The endoscope will be removed. The procedure may vary among health care providers and hospitals. What happens after the procedure?  Your blood pressure, heart rate, breathing rate, and blood oxygen level will be monitored until the medicines you were given have worn off.  You may continue to receive fluids and medicines through an IV. When you are able to drink clear fluids, your IV will be removed.  Do not drive for 24 hours if you were given a sedative during your procedure. Summary  Endoscopic sleeve gastroplasty is a procedure to help you lose weight (bariatric procedure).  This procedure is done through a long, flexible tube (endoscope). No incisions are necessary.  During this procedure, the surgeon will use staples or stitches (sutures) to make your stomach shorter and narrower.  It will be important to work with your health care providers for years after this procedure to maintain a healthy weight through diet and exercise. This information is not intended to replace advice given to you by your health care provider. Make sure you discuss any questions you have with your health care provider. Document Revised: 03/25/2019 Document Reviewed: 06/26/2017 Elsevier Patient Education  [place].  Hidradenitis Suppurativa Hidradenitis suppurativa is a long-term (chronic) skin disease. It is similar to a severe form of acne, but it affects areas of the body where acne would be unusual, especially areas of the body where skin rubs against skin and becomes moist. These include:  Underarms.  Groin.  Genital area.  Buttocks.  Upper thighs.  Breasts. Hidradenitis suppurativa may start out as small  lumps or pimples caused by blocked sweat glands or hair follicles. Pimples may develop into deep sores that break open (rupture) and drain pus. Over time, affected areas of skin may thicken and  become scarred. This condition is rare and does not spread from person to person (non-contagious). What are the causes? The exact cause of this condition is not known. It may be related to:  Female and female hormones.  An overactive disease-fighting system (immune system). The immune system may over-react to blocked hair follicles or sweat glands and cause swelling and pus-filled sores. What increases the risk? You are more likely to develop this condition if you:  Are female.  Are 67-40 years old.  Have a family history of hidradenitis suppurativa.  Have a personal history of acne.  Are overweight.  Smoke.  Take the medicine lithium. What are the signs or symptoms? The first symptoms are usually painful bumps in the skin, similar to pimples. The condition may get worse over time (progress), or it may only cause mild symptoms. If the disease progresses, symptoms may include:  Skin bumps getting bigger and growing deeper into the skin.  Bumps rupturing and draining pus.  Itchy, infected skin.  Skin getting thicker and scarred.  Tunnels under the skin (fistulas) where pus drains from a bump.  Pain during daily activities, such as pain during walking if your groin area is affected.  Emotional problems, such as stress or depression. This condition may affect your appearance and your ability or willingness to wear certain clothes or do certain activities. How is this diagnosed? This condition is diagnosed by a health care provider who specializes in skin diseases (dermatologist). You may be diagnosed based on:  Your symptoms and medical history.  A physical exam.  Testing a pus sample for infection.  Blood tests. How is this treated? Your treatment will depend on how severe your  symptoms are. The same treatment will not work for everybody with this condition. You may need to try several treatments to find what works best for you. Treatment may include:  Cleaning and bandaging (dressing) your wounds as needed.  Lifestyle changes, such as new skin care routines.  Taking medicines, such as: ? Antibiotics. ? Acne medicines. ? Medicines to reduce the activity of the immune system. ? A diabetes medicine (metformin). ? Birth control pills, for women. ? Steroids to reduce swelling and pain.  Working with a mental health care provider, if you experience emotional distress due to this condition. If you have severe symptoms that do not get better with medicine, you may need surgery. Surgery may involve:  Using a laser to clear the skin and remove hair follicles.  Opening and draining deep sores.  Removing the areas of skin that are diseased and scarred. Follow these instructions at home: Medicines   Take over-the-counter and prescription medicines only as told by your health care provider.  If you were prescribed an antibiotic medicine, take it as told by your health care provider. Do not stop taking the antibiotic even if your condition improves. Skin care  If you have open wounds, cover them with a clean dressing as told by your health care provider. Keep wounds clean by washing them gently with soap and water when you bathe.  Do not shave the areas where you get hidradenitis suppurativa.  Do not wear deodorant.  Wear loose-fitting clothes.  Try to avoid getting overheated or sweaty. If you get sweaty or wet, change into clean, dry clothes as soon as you can.  To help relieve pain and itchiness, cover sore areas with a warm, clean washcloth (warm compress) for 5-10 minutes as often as needed.  If told by your health  care provider, take a bleach bath twice a week: ? Fill your bathtub halfway with water. ? Pour in  cup of unscented household  bleach. ? Soak in the tub for 5-10 minutes. ? Only soak from the neck down. Avoid water on your face and hair. ? Shower to rinse off the bleach from your skin. General instructions  Learn as much as you can about your disease so that you have an active role in your treatment. Work closely with your health care provider to find treatments that work for you.  If you are overweight, work with your health care provider to lose weight as recommended.  Do not use any products that contain nicotine or tobacco, such as cigarettes and e-cigarettes. If you need help quitting, ask your health care provider.  If you struggle with living with this condition, talk with your health care provider or work with a mental health care provider as recommended.  Keep all follow-up visits as told by your health care provider. This is important. Where to find more information  Hidradenitis [place].: https://www.hs-foundation.org/ Contact a health care provider if you have:  A flare-up of hidradenitis suppurativa.  A fever or chills.  Trouble controlling your symptoms at home.  Trouble doing your daily activities because of your symptoms.  Trouble dealing with emotional problems related to your condition. Summary  Hidradenitis suppurativa is a long-term (chronic) skin disease. It is similar to a severe form of acne, but it affects areas of the body where acne would be unusual.  The first symptoms are usually painful bumps in the skin, similar to pimples. The condition may get worse over time (progress), or it may only cause mild symptoms.  If you have open wounds, cover them with a clean dressing as told by your health care provider. Keep wounds clean by washing them gently with soap and water when you bathe.  Besides skin care, treatment may include medicines, laser treatment, and surgery. This information is not intended to replace advice given to you by your health care provider.  Make sure you discuss any questions you have with your health care provider. Document Revised: 12/10/2017 Document Reviewed: 12/10/2017 Elsevier Patient Education  2020 [employer].

## 2020-07-03 DIAGNOSIS — R7303 Prediabetes: Secondary | ICD-10-CM | POA: Insufficient documentation

## 2020-07-03 DIAGNOSIS — L659 Nonscarring hair loss, unspecified: Secondary | ICD-10-CM

## 2020-07-03 DIAGNOSIS — F339 Major depressive disorder, recurrent, unspecified: Secondary | ICD-10-CM

## 2020-07-03 HISTORY — DX: Major depressive disorder, recurrent, unspecified: F33.9

## 2020-07-03 HISTORY — DX: Nonscarring hair loss, unspecified: L65.9

## 2020-07-05 ENCOUNTER — Encounter: Payer: Self-pay | Admitting: Internal Medicine

## 2020-07-05 DIAGNOSIS — G43009 Migraine without aura, not intractable, without status migrainosus: Secondary | ICD-10-CM | POA: Diagnosis not present

## 2020-07-06 ENCOUNTER — Ambulatory Visit: Payer: BC Managed Care – PPO | Admitting: Internal Medicine

## 2020-07-10 NOTE — Addendum Note (Signed)
Addended by: Orland Mustard on: 07/10/2020 02:41 AM   Modules accepted: Orders

## 2020-07-13 DIAGNOSIS — G932 Benign intracranial hypertension: Secondary | ICD-10-CM | POA: Diagnosis not present

## 2020-07-14 ENCOUNTER — Ambulatory Visit: Admission: RE | Admit: 2020-07-14 | Payer: BC Managed Care – PPO | Source: Ambulatory Visit

## 2020-07-21 DIAGNOSIS — F329 Major depressive disorder, single episode, unspecified: Secondary | ICD-10-CM | POA: Diagnosis not present

## 2020-07-21 DIAGNOSIS — I10 Essential (primary) hypertension: Secondary | ICD-10-CM | POA: Diagnosis not present

## 2020-07-21 DIAGNOSIS — F419 Anxiety disorder, unspecified: Secondary | ICD-10-CM | POA: Diagnosis not present

## 2020-07-24 ENCOUNTER — Ambulatory Visit: Payer: BC Managed Care – PPO | Admitting: Dermatology

## 2020-07-24 ENCOUNTER — Other Ambulatory Visit: Payer: Self-pay | Admitting: Surgery

## 2020-07-24 ENCOUNTER — Other Ambulatory Visit (HOSPITAL_COMMUNITY): Payer: Self-pay | Admitting: Surgery

## 2020-07-28 NOTE — Addendum Note (Signed)
Addended by: Orland Mustard on: 07/28/2020 06:55 PM   Modules accepted: Orders

## 2020-08-04 ENCOUNTER — Other Ambulatory Visit: Payer: Self-pay

## 2020-08-04 ENCOUNTER — Ambulatory Visit (HOSPITAL_COMMUNITY)
Admission: RE | Admit: 2020-08-04 | Discharge: 2020-08-04 | Disposition: A | Discharge status: Home / Self Care | Payer: BC Managed Care – PPO | Source: Ambulatory Visit | Attending: Surgery | Admitting: Surgery

## 2020-08-04 DIAGNOSIS — L03811 Cellulitis of head [any part, except face]: Secondary | ICD-10-CM | POA: Diagnosis not present

## 2020-08-04 DIAGNOSIS — Z01818 Encounter for other preprocedural examination: Secondary | ICD-10-CM | POA: Diagnosis not present

## 2020-08-04 DIAGNOSIS — L02811 Cutaneous abscess of head [any part, except face]: Secondary | ICD-10-CM | POA: Diagnosis not present

## 2020-08-11 ENCOUNTER — Other Ambulatory Visit: Payer: Self-pay

## 2020-08-11 ENCOUNTER — Encounter: Payer: BC Managed Care – PPO | Admitting: Nurse Practitioner

## 2020-08-14 NOTE — Progress Notes (Signed)
Erroneous- canceled- no show

## 2020-08-20 ENCOUNTER — Encounter: Payer: Self-pay | Admitting: Internal Medicine

## 2020-08-25 DIAGNOSIS — F509 Eating disorder, unspecified: Secondary | ICD-10-CM | POA: Diagnosis not present

## 2020-09-04 ENCOUNTER — Ambulatory Visit: Payer: BC Managed Care – PPO | Admitting: Dietician

## 2020-09-04 ENCOUNTER — Telehealth: Payer: Self-pay | Admitting: Internal Medicine

## 2020-09-04 NOTE — Telephone Encounter (Signed)
Rejection Reason - Patient Declined - Patient declined appointment due to wanting a sooner appt per [facility]" [facility] said on Sep 04, 2020 11:51 AM

## 2020-09-20 DIAGNOSIS — G43009 Migraine without aura, not intractable, without status migrainosus: Secondary | ICD-10-CM | POA: Diagnosis not present

## 2020-09-20 DIAGNOSIS — G932 Benign intracranial hypertension: Secondary | ICD-10-CM | POA: Diagnosis not present

## 2020-09-21 ENCOUNTER — Encounter: Payer: BC Managed Care – PPO | Attending: Surgery | Admitting: Dietician

## 2020-09-21 ENCOUNTER — Other Ambulatory Visit: Payer: Self-pay

## 2020-09-21 VITALS — Ht 67.0 in | Wt 277.9 lb

## 2020-09-21 DIAGNOSIS — Z6841 Body Mass Index (BMI) 40.0 and over, adult: Secondary | ICD-10-CM

## 2020-09-21 NOTE — Patient Instructions (Signed)
   Practice chewing foods thoroughly, 20-30 times each bite.   Monitor fluid intake and food intake and work on reaching goals for protein (60g) and fluids (64g or more)   Make food choices that are low in fat and sugar, choose lean meats. Eat generous portions of low-carb veggies.   Work on reducing the amount of fluid consumed while eating.

## 2020-09-21 NOTE — Progress Notes (Signed)
Nutrition Assessment for Bariatric Surgery   Appointment start time: 9163   end time: 1545  Planned Surgery: Sleeve Gastrectomy  Anthropometrics: Weight: 277.9lbs Height: 5'7" BMI: 43.53   Patient's Goal Weight: <200lbs  Clinical: Medical History: HTN, pseudotumor cerebri Medications and Supplements: reconciled list in medical record Relevant labs: HbA1C 6.0% 04/07/20 Notable symptoms: hair loss, decreased appetite/ hunger Drug allergies: amoxicillin, Imitrex/ sumatriptan, Topamax/ topiramate Food allergies: apple, carrot, almonds (hives with almonds only, no other nuts)  Lifestyle and Dietary History: Dieting/ weight history:   tried phentermine and other weight loss med several times with only short-term success; personal diet changes with little/ short-term success;   was 250lbs about 2 yrs ago -- walked 2-3 times daily and limited food portions and made healthy choices.   Reports overweight since childhood. Reports weight of 190lbs at age 20  Lowest adult weight has been about 200lbs.    Reports often not feeling hungry and skips meals/ forgets to eat  Disordered or emotional eating history: no  Physical activity: no regular activity at this time  Dietary Recall:  Daily pattern: 2 meals and 0 snacks. Dining out: 2-3 meals per week. 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   Discussed stages of the bariatric diet after surgery as well as the importance of adequate  protein and fluid intake.   Provided overview of 2-week pre-op diet.   Discussed importance of positive support from other person(s) for ongoing success; encouraged communication with family regarding diet and lifestyle changes and how they can best help patient.   Nutrition Diagnosis: [place]-3.3 Overweight/ obesity related to history of excess calories and inadequate physical activity as evidenced by patient with current BMI of 43.53, working on dietary changes for weight loss prior to bariatric surgery.  Teaching method(s) utilized: Copy provided: . Pre-op Goals . Diet Stage Template . Visit summary with nutrition goals  Learning Readiness: Ready for change   Barriers to learning/ implementing lifestyle change: none  Demonstrated degree of understanding via: Teach Back  Summary:  Patient voices readiness to make diet and lifestyle changes in effort to lose weight and prepare for bariatric surgery.   Patient's goal for weight loss is improved health, medication reduction.  She has solid support from family.   She agrees to work on [employer], reducing fluids consumed during meals, and monitoring protein and fluid intake prior to surgery.   She is motivated to follow the bariatric diet after surgery.   From a nutrition standpoint, she is ready to proceed with the bariatric surgery program.    Plan:  Patient commits to returning for  Pre-op class prior to surgery.   She will plan to return for post-op RD visits beginning 2 weeks after surgery.

## 2020-10-02 ENCOUNTER — Encounter: Payer: Self-pay | Admitting: Internal Medicine

## 2020-10-02 NOTE — Telephone Encounter (Signed)
Patient says she has been having 7 to 10 times per day since Thursday, eating bland diet but still having BM watery diarrhea, no foul smell. No nausea or fever. Just stomach cramps. Patient refused UC, scheduled Virtual with PCP for tomorrow at 4 pm, advised symptoms worsen to seek medical advice at [facility] or ER.

## 2020-10-03 ENCOUNTER — Telehealth (INDEPENDENT_AMBULATORY_CARE_PROVIDER_SITE_OTHER): Payer: BC Managed Care – PPO | Admitting: Internal Medicine

## 2020-10-03 ENCOUNTER — Other Ambulatory Visit: Payer: Self-pay

## 2020-10-03 ENCOUNTER — Encounter: Payer: Self-pay | Admitting: Internal Medicine

## 2020-10-03 VITALS — Ht 67.0 in | Wt 277.9 lb

## 2020-10-03 DIAGNOSIS — R197 Diarrhea, unspecified: Secondary | ICD-10-CM

## 2020-10-03 DIAGNOSIS — R109 Unspecified abdominal pain: Secondary | ICD-10-CM

## 2020-10-03 NOTE — Progress Notes (Signed)
Onset of symptoms was last Thursday and diarrhea has been ongoing. No one around the Patient is having any symptoms. Diarrhea is going from dark to yellowish green.   No new foods or changes.

## 2020-10-03 NOTE — Progress Notes (Signed)
Virtual Visit via Video Note  I connected with Debbie Townsend  on 10/03/20 at  4:25 PM EDT by a video enabled telemedicine application and verified that I am speaking with the correct person using two identifiers.  Location patient: work  Environmental manager or home office Persons participating in the virtual visit: patient, provider  I discussed the limitations of evaluation and management by telemedicine and the availability of in person appointments. The patient expressed understanding and agreed to proceed.   HPI: 1. Diarrhea since 09/29/20 6-8 x per day with hunter green yellow loose stools sometimes fatigue nausea and glower to middle abdominal cramps. Denies GIB, covid 19 exposure, fever. Tried soup/crackers and peptobismol. Denies sick contacts, Abx, no problems with dairy. She has 1 pet 57 y.o dog w/o diarrhea, no other household contacts have diarrhea  ROS: See pertinent positives and negatives per HPI.  Past Medical History:  Diagnosis Date  . Anxiety   . BRCA negative   . Depression   . Family history of anesthesia complication     mother had Post -op nausea and dizziness.  . Foot drop, left foot   . GERD (gastroesophageal reflux disease)   . Headache   . History of kidney stones   . Hypertension   . Kidney stone   . Pseudotumor cerebri 2007 or 2009    Past Surgical History:  Procedure Laterality Date  . CESAREAN SECTION     2009/2017  . CHOLECYSTECTOMY N/A 02/04/2019   Procedure: LAPAROSCOPIC CHOLECYSTECTOMY;  Surgeon: Fredirick Maudlin, MD;  Location: [facility] ORS;  Service: General;  Laterality: N/A;  . LUMBAR LAMINECTOMY/DECOMPRESSION MICRODISCECTOMY Left 09/22/2014   Procedure: Left Lumbar four-five microdiskectomy;  Surgeon: Consuella Lose, MD;  Location: [facility];  Service: Neurosurgery;  Laterality: Left;  Left Lumbar four-five microdiskectomy  . WISDOM TOOTH EXTRACTION       Current Outpatient Medications:  .  acetaZOLAMIDE (DIAMOX) 500 MG capsule,  Take 1,000 mg by mouth in the morning., Disp: , Rfl:  .  AIMOVIG 140 MG/ML SOAJ, SMARTSIG:140 Milligram(s) SUB-Q Once a Month, Disp: , Rfl:  .  albuterol (VENTOLIN HFA) 108 (90 Base) MCG/ACT inhaler, Inhale 1-2 puffs into the lungs every 4 (four) hours as needed for wheezing or shortness of breath., Disp: 1 g, Rfl: 12 .  famotidine (PEPCID) 20 MG tablet, Take 20 mg by mouth daily., Disp: , Rfl:  .  levocetirizine (XYZAL) 5 MG tablet, Take 5 mg by mouth every evening. , Disp: , Rfl:  .  lisinopril (ZESTRIL) 20 MG tablet, Take 1 tablet (20 mg total) by mouth daily., Disp: 90 tablet, Rfl: 3 .  sertraline (ZOLOFT) 100 MG tablet, Take 1 tablet (100 mg total) by mouth daily., Disp: 90 tablet, Rfl: 3  EXAM:  VITALS per patient if applicable:  GENERAL: alert, oriented, appears well and in no acute distress  HEENT: atraumatic, conjunttiva clear, no obvious abnormalities on inspection of external nose and ears  NECK: normal movements of the head and neck  LUNGS: on inspection no signs of respiratory distress, breathing rate appears normal, no obvious gross SOB, gasping or wheezing  CV: no obvious cyanosis  MS: moves all visible extremities without noticeable abnormality  PSYCH/NEURO: pleasant and cooperative, no obvious depression or anxiety, speech and thought processing grossly intact  ASSESSMENT AND PLAN:  Discussed the following assessment and plan:  Diarrhea, unspecified type - Plan: Clostridium Difficile by PCR(Labcorp/Sunquest), GI pathogen panel by PCR, stool  Abdominal cramping - Plan: Clostridium Difficile by PCR(Labcorp/Sunquest), GI  pathogen panel by PCR, stool BRAT diet, no need for nausea medication, discuss Align/Cuturelle, renew otc, hydration zero sugar gatorade/powerade/water  peptobismol  If negative consider immodium, bentyl  No blood in stool but if develops consider GI referral  Stool to hospital    -we discussed possible serious and likely etiologies, options for  evaluation and workup, limitations of telemedicine visit vs in person visit, treatment, treatment risks and precautions.      I discussed the assessment and treatment plan with the patient. The patient was provided an opportunity to ask questions and all were answered. The patient agreed with the plan and demonstrated an understanding of the instructions.    Time spent 20 minutes  Delorise Jackson, MD

## 2020-10-05 ENCOUNTER — Telehealth: Payer: Self-pay | Admitting: Internal Medicine

## 2020-10-05 NOTE — Telephone Encounter (Signed)
Patient had video vist 10/03/20. A stool kit was put together for the Patient.  Left message to return call for Patient to come and pick this up.   Mychart message sent

## 2020-10-09 ENCOUNTER — Other Ambulatory Visit
Admission: RE | Admit: 2020-10-09 | Discharge: 2020-10-09 | Disposition: A | Discharge status: Home / Self Care | Payer: BC Managed Care – PPO | Source: Ambulatory Visit | Attending: Internal Medicine | Admitting: Internal Medicine

## 2020-10-09 DIAGNOSIS — R197 Diarrhea, unspecified: Secondary | ICD-10-CM | POA: Diagnosis not present

## 2020-10-09 DIAGNOSIS — A09 Infectious gastroenteritis and colitis, unspecified: Secondary | ICD-10-CM | POA: Diagnosis not present

## 2020-10-09 DIAGNOSIS — R109 Unspecified abdominal pain: Secondary | ICD-10-CM | POA: Diagnosis not present

## 2020-10-09 LAB — GASTROINTESTINAL PANEL BY PCR, STOOL (REPLACES STOOL CULTURE)

## 2020-10-09 LAB — C DIFFICILE QUICK SCREEN W PCR REFLEX
C Diff antigen: NEGATIVE
C Diff interpretation: NOT DETECTED
C Diff toxin: NEGATIVE

## 2020-10-11 ENCOUNTER — Telehealth: Payer: Self-pay

## 2020-10-11 NOTE — Telephone Encounter (Signed)
LMTCB please transfer pt to me.

## 2020-10-14 ENCOUNTER — Encounter: Payer: Self-pay | Admitting: Internal Medicine

## 2020-10-17 ENCOUNTER — Other Ambulatory Visit: Payer: Self-pay

## 2020-10-17 ENCOUNTER — Encounter: Payer: BC Managed Care – PPO | Attending: Surgery | Admitting: Dietician

## 2020-10-17 DIAGNOSIS — Z6841 Body Mass Index (BMI) 40.0 and over, adult: Secondary | ICD-10-CM | POA: Insufficient documentation

## 2020-10-17 NOTE — Progress Notes (Signed)
Pre-Operative Nutrition Class:  Appt start time: 1308   End time:  6578.  Patient was seen on 10/17/20 for Pre-Operative Bariatric Surgery Education at Nutrition and [facility] Education [facility] at [facility].   Surgery date: 11/14/20 Surgery type: Sleeve gastrectomy Start weight at [facility]: 277.9lbs Weight today: 284.5lbs  InBody  BODY COMP RESULTS    BMI (kg/m^2) 45.9  Fat Mass (lbs) 146.5  Dry Lean Mass (lbs) 36.6  Total Body Water (lbs) 101.4     Lot number Expiration date  Celebrate Vitamins Pack:  Calcium soft chews cherry   1164A   11/2021  Calcium soft chews watermelon 1165 11/2021  Calcium soft chews straw-ban cream 1173 11/2021  Calcium soft chews cafe mocha 1166 11/2021  Calcium soft chews orange 1098 09/2021  Calcium soft chews blackberry 1166 11/2021  Calcium soft chews caramel 1130 10/2021  Calcium soft chews chocolate 1097 09/2021  Calcium soft chews island fruit 1095 09/2021  Calcium soft chews lemon cream 1123 10/2021      Iron soft chews 5m cherry burst 21132B6 04/2022  Iron soft chews 633mtwisted citrus 2146962X53/2023      Multivitamin soft chews very cherry 21011B6 06/2021  Multivitamin soft chews strawberry 2128413K47/2022               Unjury products:  Vanilla shake   [id number] 04/11/2021  10/13/21  Chocolate shake 105638V5I4P [id number]/28/2022  10/13/21    Unflavored powder 12630160(indiv) 521093231/2022   05/2022  Chocolate powder 525573223/2022  Chicken soup powder 12025427(indiv) 520623761/2022   05/2022    The following the learning objectives were met by the patient during this course:  Identify Pre-Op Dietary Goals and will begin 2 weeks pre-operatively  Identify appropriate sources of fluids and proteins   State protein recommendations and appropriate sources pre and post-operatively  Identify Post-Operative Dietary Goals and will follow for 2 weeks post-operatively  Identify appropriate  multivitamin and calcium sources  Describe the need for physical activity post-operatively and will follow MD recommendations  State when to call healthcare provider regarding medication questions or post-operative complications  Handouts given during class include:  Pre-Op Bariatric Surgery Diet Handout  Protein Shake Handout  Post-Op Bariatric Surgery Nutrition Handout  BELT Program Information Flyer  [facility]ric Supplements Price List  Follow-Up Plan: [facility] 2 weeks post operatively for diet advancement per MD.

## 2020-10-19 ENCOUNTER — Ambulatory Visit: Payer: Self-pay | Admitting: Surgery

## 2020-10-19 NOTE — H&P (Signed)
Surgical Evaluation   Chief Complaint: morbid obesity  EEF:[id number] visit today for follow-up regarding surgical management of severe obesity.  Reports no changes in her health.  Has completed the preoperative pathway with no varices identified. She has several insightful questions today that she needs to discuss.  Chest x-ray/upper GI 07/2020: Negative, no hiatal hernia Labs 07/21/20 - Humulin A1c 6.3, trace proteinuria on UA, remainder unremarkable (CBC, CMP, TSH, T4, lipid panel,PT/INR, B12, folate, vitamin D, H. Pylori) Nutritionist Erlene Quan 10/7 and 11/2 - approved Psychologist Dr. Ardath Sax September 13, 2020 approved    Initial visit 07/21/20: 34yo woman presents with chief complaint of severe obesity. Her lowest weight as an adult was around 180 pounds when she was 14. With time, pregnancies and other medical issues she has continued to gain weight. This affects her health in multiple ways, most notably pseudotumor cerebri, her neurologist would like to wean her off Diamox and last time she tried to wean off that medication it caused her to have blindness; and therefore she is justifiably concerned about tapering off this medication without a strong position for alternate treatment, and weight loss has been recommended as the most reliable means of relieving this problem. She also experiences chronic low back and hip pain which is worsening, as well as knee pain, shortness of breath, and a general hindrance to her enjoying her life as she avoids certain activities due to her habitus. Treatments in the past included Adipex which did afford a weight loss of 75 pounds down to 200, which she regained after having children. She has tried a couple other medications in the past as well with limited success. Has recently discussed semaglutide with PCP, but due to concern of side effects she is not interested in this medication.   Most recent labs were in April 2021 and included a CMP which was  unremarkable, lipid panel showing low LDL but otherwise normal, CBC was within normal limits, and hemoglobin A1c elevated to 6. Has repeat labs to be scheduled in november along with iron studies, thyroid studies and thyroid US due to complaint of fatigue and hair loss.   FHX: She does endorse some family history of obesity physically her father and some aunts and uncles. Breast cancer in her mother at age 86, hypertension, diabetes, depression, hypothyroidism. In her father, liver disease, heart attack, arthritis, alcohol abuse, depression, drug abuse, hypertension and early death. Hypertension, depression and arthritis in her sisters.  Social history denies alcohol use, denies drug abuse, former smoker of cigarettes, quit many years ago. Married with 2 daughters ages 40 and 12. She is a Academic librarian, currently working from home.  Endorses a fairly well-balanced diet, denies a lot of greasy or fried foods, usually uses air fryer or grill for me aspect does have a fair amount of starch in her diet.   She is interested in sleeve gastrectomy. Allergies  Allergen Reactions  . Apple Other (See Comments)    Lips itch and swell, inside of mouth gets hives  . Carrot [Daucus Carota] Other (See Comments)    Lips itch and swell, inside of mouth gets hives  . Monistat [Miconazole] Swelling    Severe vaginal itching and swelling  . Other Itching and Swelling    Almonds-mouth swells and itches  . Almond Oil Hives  . Amoxicillin Hives, Itching, Swelling and Dermatitis    Did it involve swelling of the face/tongue/throat, SOB, or low BP? No Did it involve sudden or severe rash/hives, skin  peeling, or any reaction on the inside of your mouth or nose? Yes Did you need to seek medical attention at a hospital or doctor's office? Yes When did it last happen?within the past 5 years If all above answers are "NO", may proceed with cephalosporin use.   . Imitrex [Sumatriptan]     Felt  like heart attack   . Topamax [Topiramate]     Memory loss      Past Medical History:  Diagnosis Date  . Anxiety   . BRCA negative   . Depression   . Family history of anesthesia complication     mother had Post -op nausea and dizziness.  . Foot drop, left foot   . GERD (gastroesophageal reflux disease)   . Headache   . History of kidney stones   . Hypertension   . Kidney stone   . Pseudotumor cerebri 2007 or 2009    Past Surgical History:  Procedure Laterality Date  . CESAREAN SECTION     2009/2017  . CHOLECYSTECTOMY N/A 02/04/2019   Procedure: LAPAROSCOPIC CHOLECYSTECTOMY;  Surgeon: Fredirick Maudlin, MD;  Location: [facility] ORS;  Service: General;  Laterality: N/A;  . LUMBAR LAMINECTOMY/DECOMPRESSION MICRODISCECTOMY Left 09/22/2014   Procedure: Left Lumbar four-five microdiskectomy;  Surgeon: Consuella Lose, MD;  Location: [place] [facility];  Service: Neurosurgery;  Laterality: Left;  Left Lumbar four-five microdiskectomy  . WISDOM TOOTH EXTRACTION      Family History  Problem Relation Age of Onset  . Cancer Mother        breast dx'ed age 13/49  . Hypertension Mother   . Diabetes Mother   . Hypothyroidism Mother   . Depression Mother   . Breast cancer Mother        78s  . Liver disease Father   . Heart attack Father   . Arthritis Father   . Alcohol abuse Father   . Depression Father   . Drug abuse Father   . Early death Father   . Heart disease Father   . Hypertension Father   . Depression Sister   . Hypertension Sister   . Arthritis Sister   . Hypertension Sister   . Cancer Maternal Grandmother        stomach>liver>brain met  . Cancer Maternal Uncle        lung not a smoker     Social History   Socioeconomic History  . Marital status: Married    Spouse name: Not on file  . Number of children: 2  . Years of education: Not on file  . Highest education level: Not on file  Occupational History  . Not on file  Tobacco Use  . Smoking status: Former  Smoker    Packs/day: 0.50    Types: Cigarettes  . Smokeless tobacco: Never Used  Vaping Use  . Vaping Use: Never used  Substance and Sexual Activity  . Alcohol use: Yes  . Drug use: No  . Sexual activity: Yes    Birth control/protection: Rhythm  Other Topics Concern  . 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 Difficulty of Paying Living Expenses: Not on file  Food Insecurity:   . Worried About Estate manager/land agent  of Food in the Last Year: Not on file  . Ran Out of Food in the Last Year: Not on file  Transportation Needs:   . Lack of Transportation (Medical): Not on file  . Lack of Transportation (Non-Medical): Not on file  Physical Activity:   . Days of Exercise per Week: Not on file  . Minutes of Exercise per Session: Not on file  Stress:   . Feeling of Stress : Not on file  Social Connections:   . Frequency of Communication with Friends and Family: Not on file  . Frequency of Social Gatherings with Friends and Family: Not on file  . Attends Religious Services: Not on file  . Active Member of Clubs or Organizations: Not on file  . Attends Archivist Meetings: Not on file  . Marital Status: Not on file    Current Outpatient Medications on File Prior to Visit  Medication Sig Dispense Refill  . acetaZOLAMIDE (DIAMOX) 500 MG capsule Take 1,000 mg by mouth in the morning.    Marland Kitchen AIMOVIG 140 MG/ML SOAJ SMARTSIG:140 Milligram(s) SUB-Q Once a Month    . albuterol (VENTOLIN HFA) 108 (90 Base) MCG/ACT inhaler Inhale 1-2 puffs into the lungs every 4 (four) hours as needed for wheezing or shortness of breath. 1 g 12  . famotidine (PEPCID) 20 MG tablet Take 20 mg by mouth daily.    Marland Kitchen levocetirizine (XYZAL) 5 MG tablet Take 5 mg by mouth every evening.     Marland Kitchen lisinopril  (ZESTRIL) 20 MG tablet Take 1 tablet (20 mg total) by mouth daily. 90 tablet 3  . sertraline (ZOLOFT) 100 MG tablet Take 1 tablet (100 mg total) by mouth daily. 90 tablet 3   No current facility-administered medications on file prior to visit.    Review of Systems: a complete, 10pt review of systems was completed with pertinent positives and negatives as documented in the HPI  Physical Exam: n/a  CBC Latest Ref Rng & Units 04/07/2020 09/24/2019 02/20/2019  WBC 4.0 - 10.5 K/uL 8.5 8.4 13.2(H)  Hemoglobin 12.0 - 15.0 g/dL 14.0 13.7 12.3  Hematocrit 36 - 46 % 42.1 41.0 38.4  Platelets 150 - 400 K/uL 286.0 308.0 365    CMP Latest Ref Rng & Units 04/07/2020 09/24/2019 05/21/2019  Glucose 70 - 99 mg/dL 115(H) 96 -  BUN 6 - 23 mg/dL 12 13 -  Creatinine 0.40 - 1.20 mg/dL 0.80 0.81 0.80  Sodium 135 - 145 mEq/L 137 139 -  Potassium 3.5 - 5.1 mEq/L 3.6 4.2 -  Chloride 96 - 112 mEq/L 105 104 -  CO2 19 - 32 mEq/L 25 27 -  Calcium 8.4 - 10.5 mg/dL 8.8 9.4 -  Total Protein 6.0 - 8.3 g/dL 6.9 6.8 -  Total Bilirubin 0.2 - 1.2 mg/dL 0.5 0.4 -  Alkaline Phos 39 - 117 U/L 50 49 -  AST 0 - 37 U/L 15 14 -  ALT 0 - 35 U/L 23 20 -    Lab Results  Component Value Date   INR 1.0 04/23/2018   INR 0.97 02/27/2017    Imaging: No results found.   A/P:  MORBID OBESITY (E66.01) Story: She remains a good candidate for sleeve gastrectomy. We have discussed the relevant anatomy and surgery including technical aspects using a diagram to demonstrate. We discussed the risks of bleeding, infection, pain, scarring, injury to intra-abdominal structures, staple line leak or abscess, chronic abdominal pain or nausea, new onset or worsened GERD, DVT/PE, pneumonia, heart  attack, stroke, death, failure to reach weight loss goals and weight regain, hernia. Discussed the typical pre-, peri-, and postoperative course. Discussed the critical importance of lifelong behavioral changes to combat the chronic and relapsing disease  which is obesity- surgery is a treatment, not a cure. Questions were welcomed and answered to her satisfaction. She is ready to proceed- scheduled for 11/30 but hoping to move up to 11/23.   ANXIETY AND DEPRESSION (F41.9) Story: takes xanax and sertraline HYPERTENSION (I10) Story: controlled, takes lisinopril MIGRAINES (G43.909) Story: takes [place] (G93.2) Story: follows w [facility] neurology/ ophtho/ ENT for intracranial hypertension, who recommended her to weight loss surgery. currently on diamox CHRONIC LOW BACK PAIN (M54.5) Story: on flexeril, sees chiropractor. history of lumbar laminectomy/ decompression microdiscectomy Oct 2015 Dr. Kathyrn Sheriff HISTORY OF KIDNEY STONES (M63.817) NONALCOHOLIC HEPATOSTEATOSIS (K76.0) GERD (GASTROESOPHAGEAL REFLUX DISEASE) (K21.9) Story: pepcid HISTORY OF HYSTERECTOMY (Z90.710) Story: Feb 2020  obgyn DR. Kenton Kingfisher HISTORY OF LAPAROSCOPIC CHOLECYSTECTOMY [phone]) Story: Feb 2020, [place] PREDIABETES (R73.03) Story: HgA1c 6    Patient Active Problem List   Diagnosis Date Noted  . Hair loss 07/03/2020  . Prediabetes 07/03/2020  . Depression, recurrent ([place]) 07/03/2020  . Intractable migraine without aura and without status migrainosus 04/04/2020  . Gastroesophageal reflux disease without esophagitis 03/29/2020  . Chronic midline low back pain 02/13/2020  . CSF leak from nose 12/07/2019  . Other optic atrophy, bilateral 12/07/2019  . IIH (idiopathic intracranial hypertension) 12/07/2019  . Annual physical exam 09/24/2019  . Insomnia 09/24/2019  . Fatigue 09/24/2019  . History of kidney stones 09/24/2019  . Class 3 severe obesity due to excess calories without serious comorbidity with body mass index (BMI) of 40.0 to 44.9 in adult ([place]) 05/03/2019  . Allergic rhinitis 03/24/2019  . Status post laparoscopic hysterectomy 02/12/2019  . Adenomyosis 02/04/2019  . DUB (dysfunctional uterine bleeding) 09/10/2018  . Fatty liver  04/02/2018  . Gallstones 04/02/2018  . Heavy menses 04/02/2018  . Family history of breast cancer 04/02/2018  . Anxiety 04/02/2018  . Anxiety and depression 12/16/2016  . Cesarean delivery delivered 03/22/2016  . Rubella non-immune status, antepartum 01/08/2016  . Morbid obesity with BMI of 40.0-44.9, adult ([place]) 01/04/2016  . Essential hypertension 11/13/2015  . Pseudotumor cerebri 11/13/2015  . HNP (herniated nucleus pulposus), lumbar 09/22/2014       Romana Juniper, MD [facility], [place]  See AMION to contact appropriate on-call provider

## 2020-10-19 NOTE — H&P (View-Only) (Signed)
Surgical Evaluation   Chief Complaint: morbid obesity  EEF:[id number] visit today for follow-up regarding surgical management of severe obesity.  Reports no changes in her health.  Has completed the preoperative pathway with no varices identified. She has several insightful questions today that she needs to discuss.  Chest x-ray/upper GI 07/2020: Negative, no hiatal hernia Labs 07/21/20 - Humulin A1c 6.3, trace proteinuria on UA, remainder unremarkable (CBC, CMP, TSH, T4, lipid panel,PT/INR, B12, folate, vitamin D, H. Pylori) Nutritionist Erlene Quan 10/7 and 11/2 - approved Psychologist Dr. Ardath Sax September 13, 2020 approved    Initial visit 07/21/20: 34yo woman presents with chief complaint of severe obesity. Her lowest weight as an adult was around 180 pounds when she was 14. With time, pregnancies and other medical issues she has continued to gain weight. This affects her health in multiple ways, most notably pseudotumor cerebri, her neurologist would like to wean her off Diamox and last time she tried to wean off that medication it caused her to have blindness; and therefore she is justifiably concerned about tapering off this medication without a strong position for alternate treatment, and weight loss has been recommended as the most reliable means of relieving this problem. She also experiences chronic low back and hip pain which is worsening, as well as knee pain, shortness of breath, and a general hindrance to her enjoying her life as she avoids certain activities due to her habitus. Treatments in the past included Adipex which did afford a weight loss of 75 pounds down to 200, which she regained after having children. She has tried a couple other medications in the past as well with limited success. Has recently discussed semaglutide with PCP, but due to concern of side effects she is not interested in this medication.   Most recent labs were in April 2021 and included a CMP which was  unremarkable, lipid panel showing low LDL but otherwise normal, CBC was within normal limits, and hemoglobin A1c elevated to 6. Has repeat labs to be scheduled in november along with iron studies, thyroid studies and thyroid US due to complaint of fatigue and hair loss.   FHX: She does endorse some family history of obesity physically her father and some aunts and uncles. Breast cancer in her mother at age 86, hypertension, diabetes, depression, hypothyroidism. In her father, liver disease, heart attack, arthritis, alcohol abuse, depression, drug abuse, hypertension and early death. Hypertension, depression and arthritis in her sisters.  Social history denies alcohol use, denies drug abuse, former smoker of cigarettes, quit many years ago. Married with 2 daughters ages 40 and 12. She is a Academic librarian, currently working from home.  Endorses a fairly well-balanced diet, denies a lot of greasy or fried foods, usually uses air fryer or grill for me aspect does have a fair amount of starch in her diet.   She is interested in sleeve gastrectomy. Allergies  Allergen Reactions  . Apple Other (See Comments)    Lips itch and swell, inside of mouth gets hives  . Carrot [Daucus Carota] Other (See Comments)    Lips itch and swell, inside of mouth gets hives  . Monistat [Miconazole] Swelling    Severe vaginal itching and swelling  . Other Itching and Swelling    Almonds-mouth swells and itches  . Almond Oil Hives  . Amoxicillin Hives, Itching, Swelling and Dermatitis    Did it involve swelling of the face/tongue/throat, SOB, or low BP? No Did it involve sudden or severe rash/hives, skin  peeling, or any reaction on the inside of your mouth or nose? Yes Did you need to seek medical attention at a hospital or doctor's office? Yes When did it last happen?within the past 5 years If all above answers are "NO", may proceed with cephalosporin use.   . Imitrex [Sumatriptan]     Felt  like heart attack   . Topamax [Topiramate]     Memory loss      Past Medical History:  Diagnosis Date  . Anxiety   . BRCA negative   . Depression   . Family history of anesthesia complication     mother had Post -op nausea and dizziness.  . Foot drop, left foot   . GERD (gastroesophageal reflux disease)   . Headache   . History of kidney stones   . Hypertension   . Kidney stone   . Pseudotumor cerebri 2007 or 2009    Past Surgical History:  Procedure Laterality Date  . CESAREAN SECTION     2009/2017  . CHOLECYSTECTOMY N/A 02/04/2019   Procedure: LAPAROSCOPIC CHOLECYSTECTOMY;  Surgeon: Fredirick Maudlin, MD;  Location: [facility];  Service: General;  Laterality: N/A;  . LUMBAR LAMINECTOMY/DECOMPRESSION MICRODISCECTOMY Left 09/22/2014   Procedure: Left Lumbar four-five microdiskectomy;  Surgeon: Consuella Lose, MD;  Location: [facility];  Service: Neurosurgery;  Laterality: Left;  Left Lumbar four-five microdiskectomy  . WISDOM TOOTH EXTRACTION      Family History  Problem Relation Age of Onset  . Cancer Mother        breast dx'ed age 13/49  . Hypertension Mother   . Diabetes Mother   . Hypothyroidism Mother   . Depression Mother   . Breast cancer Mother        78s  . Liver disease Father   . Heart attack Father   . Arthritis Father   . Alcohol abuse Father   . Depression Father   . Drug abuse Father   . Early death Father   . Heart disease Father   . Hypertension Father   . Depression Sister   . Hypertension Sister   . Arthritis Sister   . Hypertension Sister   . Cancer Maternal Grandmother        stomach>liver>brain met  . Cancer Maternal Uncle        lung not a smoker     Social History   Socioeconomic History  . Marital status: Married    Spouse name: Not on file  . Number of children: 2  . Years of education: Not on file  . Highest education level: Not on file  Occupational History  . Not on file  Tobacco Use  . Smoking status: Former  Smoker    Packs/day: 0.50    Types: Cigarettes  . Smokeless tobacco: Never Used  Vaping Use  . Vaping Use: Never used  Substance and Sexual Activity  . Alcohol use: Yes  . Drug use: No  . Sexual activity: Yes    Birth control/protection: Rhythm  Other Topics Concern  . 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 Difficulty of Paying Living Expenses: Not on file  Food Insecurity:   . Worried About Estate manager/land agent  of Food in the Last Year: Not on file  . Ran Out of Food in the Last Year: Not on file  Transportation Needs:   . Lack of Transportation (Medical): Not on file  . Lack of Transportation (Non-Medical): Not on file  Physical Activity:   . Days of Exercise per Week: Not on file  . Minutes of Exercise per Session: Not on file  Stress:   . Feeling of Stress : Not on file  Social Connections:   . Frequency of Communication with Friends and Family: Not on file  . Frequency of Social Gatherings with Friends and Family: Not on file  . Attends Religious Services: Not on file  . Active Member of Clubs or Organizations: Not on file  . Attends Archivist Meetings: Not on file  . Marital Status: Not on file    Current Outpatient Medications on File Prior to Visit  Medication Sig Dispense Refill  . acetaZOLAMIDE (DIAMOX) 500 MG capsule Take 1,000 mg by mouth in the morning.    Marland Kitchen AIMOVIG 140 MG/ML SOAJ SMARTSIG:140 Milligram(s) SUB-Q Once a Month    . albuterol (VENTOLIN HFA) 108 (90 Base) MCG/ACT inhaler Inhale 1-2 puffs into the lungs every 4 (four) hours as needed for wheezing or shortness of breath. 1 g 12  . famotidine (PEPCID) 20 MG tablet Take 20 mg by mouth daily.    Marland Kitchen levocetirizine (XYZAL) 5 MG tablet Take 5 mg by mouth every evening.     Marland Kitchen lisinopril  (ZESTRIL) 20 MG tablet Take 1 tablet (20 mg total) by mouth daily. 90 tablet 3  . sertraline (ZOLOFT) 100 MG tablet Take 1 tablet (100 mg total) by mouth daily. 90 tablet 3   No current facility-administered medications on file prior to visit.    Review of Systems: a complete, 10pt review of systems was completed with pertinent positives and negatives as documented in the HPI  Physical Exam: n/a  CBC Latest Ref Rng & Units 04/07/2020 09/24/2019 02/20/2019  WBC 4.0 - 10.5 K/uL 8.5 8.4 13.2(H)  Hemoglobin 12.0 - 15.0 g/dL 14.0 13.7 12.3  Hematocrit 36 - 46 % 42.1 41.0 38.4  Platelets 150 - 400 K/uL 286.0 308.0 365    CMP Latest Ref Rng & Units 04/07/2020 09/24/2019 05/21/2019  Glucose 70 - 99 mg/dL 115(H) 96 -  BUN 6 - 23 mg/dL 12 13 -  Creatinine 0.40 - 1.20 mg/dL 0.80 0.81 0.80  Sodium 135 - 145 mEq/L 137 139 -  Potassium 3.5 - 5.1 mEq/L 3.6 4.2 -  Chloride 96 - 112 mEq/L 105 104 -  CO2 19 - 32 mEq/L 25 27 -  Calcium 8.4 - 10.5 mg/dL 8.8 9.4 -  Total Protein 6.0 - 8.3 g/dL 6.9 6.8 -  Total Bilirubin 0.2 - 1.2 mg/dL 0.5 0.4 -  Alkaline Phos 39 - 117 U/L 50 49 -  AST 0 - 37 U/L 15 14 -  ALT 0 - 35 U/L 23 20 -    Lab Results  Component Value Date   INR 1.0 04/23/2018   INR 0.97 02/27/2017    Imaging: No results found.   A/P:  MORBID OBESITY (E66.01) Story: She remains a good candidate for sleeve gastrectomy. We have discussed the relevant anatomy and surgery including technical aspects using a diagram to demonstrate. We discussed the risks of bleeding, infection, pain, scarring, injury to intra-abdominal structures, staple line leak or abscess, chronic abdominal pain or nausea, new onset or worsened GERD, DVT/PE, pneumonia, heart  attack, stroke, death, failure to reach weight loss goals and weight regain, hernia. Discussed the typical pre-, peri-, and postoperative course. Discussed the critical importance of lifelong behavioral changes to combat the chronic and relapsing disease  which is obesity- surgery is a treatment, not a cure. Questions were welcomed and answered to her satisfaction. She is ready to proceed- scheduled for 11/30 but hoping to move up to 11/23.   ANXIETY AND DEPRESSION (F41.9) Story: takes xanax and sertraline HYPERTENSION (I10) Story: controlled, takes lisinopril MIGRAINES (G43.909) Story: takes [place] (G93.2) Story: follows w [facility] neurology/ ophtho/ ENT for intracranial hypertension, who recommended her to weight loss surgery. currently on diamox CHRONIC LOW BACK PAIN (M54.5) Story: on flexeril, sees chiropractor. history of lumbar laminectomy/ decompression microdiscectomy Oct 2015 Dr. Kathyrn Sheriff HISTORY OF KIDNEY STONES (M63.817) NONALCOHOLIC HEPATOSTEATOSIS (K76.0) GERD (GASTROESOPHAGEAL REFLUX DISEASE) (K21.9) Story: pepcid HISTORY OF HYSTERECTOMY (Z90.710) Story: Feb 2020  obgyn DR. Kenton Kingfisher HISTORY OF LAPAROSCOPIC CHOLECYSTECTOMY [phone]) Story: Feb 2020, [facility] PREDIABETES (R73.03) Story: HgA1c 6    Patient Active Problem List   Diagnosis Date Noted  . Hair loss 07/03/2020  . Prediabetes 07/03/2020  . Depression, recurrent ([place]) 07/03/2020  . Intractable migraine without aura and without status migrainosus 04/04/2020  . Gastroesophageal reflux disease without esophagitis 03/29/2020  . Chronic midline low back pain 02/13/2020  . CSF leak from nose 12/07/2019  . Other optic atrophy, bilateral 12/07/2019  . IIH (idiopathic intracranial hypertension) 12/07/2019  . Annual physical exam 09/24/2019  . Insomnia 09/24/2019  . Fatigue 09/24/2019  . History of kidney stones 09/24/2019  . Class 3 severe obesity due to excess calories without serious comorbidity with body mass index (BMI) of 40.0 to 44.9 in adult ([place]) 05/03/2019  . Allergic rhinitis 03/24/2019  . Status post laparoscopic hysterectomy 02/12/2019  . Adenomyosis 02/04/2019  . DUB (dysfunctional uterine bleeding) 09/10/2018  . Fatty liver  04/02/2018  . Gallstones 04/02/2018  . Heavy menses 04/02/2018  . Family history of breast cancer 04/02/2018  . Anxiety 04/02/2018  . Anxiety and depression 12/16/2016  . Cesarean delivery delivered 03/22/2016  . Rubella non-immune status, antepartum 01/08/2016  . Morbid obesity with BMI of 40.0-44.9, adult ([place]) 01/04/2016  . Essential hypertension 11/13/2015  . Pseudotumor cerebri 11/13/2015  . HNP (herniated nucleus pulposus), lumbar 09/22/2014       Romana Juniper, MD [facility], [place]  See AMION to contact appropriate on-call provider

## 2020-10-20 ENCOUNTER — Ambulatory Visit: Payer: BC Managed Care – PPO | Admitting: Dietician

## 2020-10-25 ENCOUNTER — Other Ambulatory Visit: Payer: Self-pay | Admitting: Internal Medicine

## 2020-10-25 DIAGNOSIS — I10 Essential (primary) hypertension: Secondary | ICD-10-CM

## 2020-10-25 MED ORDER — LISINOPRIL 20 MG PO TABS: 20.0000 mg | ORAL_TABLET | Freq: Every day | ORAL | 3 refills | Status: DC

## 2020-10-26 DIAGNOSIS — Z9049 Acquired absence of other specified parts of digestive tract: Secondary | ICD-10-CM | POA: Diagnosis not present

## 2020-10-26 DIAGNOSIS — R7303 Prediabetes: Secondary | ICD-10-CM | POA: Diagnosis not present

## 2020-10-26 DIAGNOSIS — Z90710 Acquired absence of both cervix and uterus: Secondary | ICD-10-CM | POA: Diagnosis not present

## 2020-11-01 ENCOUNTER — Other Ambulatory Visit: Payer: Self-pay

## 2020-11-01 ENCOUNTER — Other Ambulatory Visit (INDEPENDENT_AMBULATORY_CARE_PROVIDER_SITE_OTHER): Payer: BC Managed Care – PPO

## 2020-11-01 DIAGNOSIS — E611 Iron deficiency: Secondary | ICD-10-CM

## 2020-11-01 DIAGNOSIS — Z13818 Encounter for screening for other digestive system disorders: Secondary | ICD-10-CM

## 2020-11-01 DIAGNOSIS — K760 Fatty (change of) liver, not elsewhere classified: Secondary | ICD-10-CM

## 2020-11-01 DIAGNOSIS — R7303 Prediabetes: Secondary | ICD-10-CM

## 2020-11-01 DIAGNOSIS — Z1389 Encounter for screening for other disorder: Secondary | ICD-10-CM

## 2020-11-01 DIAGNOSIS — R635 Abnormal weight gain: Secondary | ICD-10-CM | POA: Diagnosis not present

## 2020-11-01 DIAGNOSIS — R5383 Other fatigue: Secondary | ICD-10-CM | POA: Diagnosis not present

## 2020-11-01 DIAGNOSIS — Z1159 Encounter for screening for other viral diseases: Secondary | ICD-10-CM | POA: Diagnosis not present

## 2020-11-01 DIAGNOSIS — Z13220 Encounter for screening for lipoid disorders: Secondary | ICD-10-CM

## 2020-11-01 LAB — CBC WITH DIFFERENTIAL/PLATELET
Basophils Absolute: 0.1 10*3/uL (ref 0.0–0.1)
Basophils Relative: 0.7 % (ref 0.0–3.0)
Eosinophils Absolute: 0.2 10*3/uL (ref 0.0–0.7)
Eosinophils Relative: 1.8 % (ref 0.0–5.0)
HCT: 43.6 % (ref 36.0–46.0)
Hemoglobin: 14.4 g/dL (ref 12.0–15.0)
Lymphocytes Relative: 18.4 % (ref 12.0–46.0)
Lymphs Abs: 1.6 10*3/uL (ref 0.7–4.0)
MCHC: 33.1 g/dL (ref 30.0–36.0)
MCV: 84.7 fl (ref 78.0–100.0)
Monocytes Absolute: 0.5 10*3/uL (ref 0.1–1.0)
Monocytes Relative: 5.2 % (ref 3.0–12.0)
Neutro Abs: 6.5 10*3/uL (ref 1.4–7.7)
Neutrophils Relative %: 73.9 % (ref 43.0–77.0)
Platelets: 293 10*3/uL (ref 150.0–400.0)
RBC: 5.15 Mil/uL — ABNORMAL HIGH (ref 3.87–5.11)
RDW: 14.1 % (ref 11.5–15.5)
WBC: 8.8 10*3/uL (ref 4.0–10.5)

## 2020-11-01 LAB — COMPREHENSIVE METABOLIC PANEL
ALT: 31 U/L (ref 0–35)
AST: 18 U/L (ref 0–37)
Albumin: 4 g/dL (ref 3.5–5.2)
Alkaline Phosphatase: 48 U/L (ref 39–117)
BUN: 9 mg/dL (ref 6–23)
CO2: 22 mEq/L (ref 19–32)
Calcium: 9.2 mg/dL (ref 8.4–10.5)
Chloride: 106 mEq/L (ref 96–112)
Creatinine, Ser: 0.75 mg/dL (ref 0.40–1.20)
GFR: 103.38 mL/min (ref >60.00)
Glucose, Bld: 117 mg/dL — ABNORMAL HIGH (ref 70–99)
Potassium: 4.2 mEq/L (ref 3.5–5.1)
Sodium: 137 mEq/L (ref 135–145)
Total Bilirubin: 0.4 mg/dL (ref 0.2–1.2)
Total Protein: 6.6 g/dL (ref 6.0–8.3)

## 2020-11-01 LAB — LIPID PANEL
Cholesterol: 137 mg/dL (ref 0–200)
HDL: 46.8 mg/dL (ref >39.00)
LDL Cholesterol: 73 mg/dL (ref 0–99)
NonHDL: 90.3
Total CHOL/HDL Ratio: 3
Triglycerides: 85 mg/dL (ref 0.0–149.0)
VLDL: 17 mg/dL (ref 0.0–40.0)

## 2020-11-01 LAB — HEMOGLOBIN A1C: Hgb A1c MFr Bld: 6.4 % (ref 4.6–6.5)

## 2020-11-01 LAB — TSH: TSH: 2.42 u[IU]/mL (ref 0.35–4.50)

## 2020-11-01 LAB — T4, FREE: Free T4: 0.74 ng/dL (ref 0.60–1.60)

## 2020-11-02 LAB — HEPATITIS C ANTIBODY
Hepatitis C Ab: NONREACTIVE
SIGNAL TO CUT-OFF: 0 (ref <1.00)

## 2020-11-02 LAB — URINALYSIS, ROUTINE W REFLEX MICROSCOPIC
Bilirubin Urine: NEGATIVE
Glucose, UA: NEGATIVE
Hgb urine dipstick: NEGATIVE
Ketones, ur: NEGATIVE
Leukocytes,Ua: NEGATIVE
Nitrite: NEGATIVE
Protein, ur: NEGATIVE
Specific Gravity, Urine: 1.013 (ref 1.001–1.03)
pH: 6 (ref 5.0–8.0)

## 2020-11-02 LAB — IRON,TIBC AND FERRITIN PANEL
%SAT: 15 % (calc) — ABNORMAL LOW (ref 16–45)
Ferritin: 74 ng/mL (ref 16–154)
Iron: 51 ug/dL (ref 40–190)
TIBC: 333 mcg/dL (calc) (ref 250–450)

## 2020-11-02 LAB — HEPATITIS A ANTIBODY, TOTAL: Hepatitis A AB,Total: REACTIVE — AB

## 2020-11-02 LAB — HEPATITIS B SURFACE ANTIBODY, QUANTITATIVE: Hep B S AB Quant (Post): 52 m[IU]/mL (ref >=10)

## 2020-11-02 NOTE — Patient Instructions (Addendum)
DUE TO COVID-19 ONLY ONE VISITOR IS ALLOWED TO COME WITH YOU AND STAY IN THE WAITING ROOM ONLY DURING PRE OP AND PROCEDURE.   IF YOU WILL BE ADMITTED INTO THE HOSPITAL YOU ARE ALLOWED ONE SUPPORT PERSON DURING VISITATION HOURS ONLY (10AM -8PM)   . The support person may change daily. . The support person must pass our screening, gel in and out, and wear a mask at all times, including in the patient's room. . Patients must also wear a mask when staff or their support person are in the room.   COVID SWAB TESTING MUST BE COMPLETED ON:  11-10-20 @ 11:05 am    [address] [place], [place] [address]  (Must self quarantine after testing. Follow instructions on handout.)   Your procedure is scheduled on:  Tuesday, 11-14-20   Report to [facility] Main  Entrance   Report to Short Stay at 5:30 AM   [facility])    Call this number if you have problems the morning of surgery [phone]   Do not eat food :After Midnight.   May have liquids until 4:15 AM  day of surgery   CLEAR LIQUID DIET  Foods Allowed                                                                     Foods Excluded  Water, Black Coffee and tea, regular and decaf              liquids that you cannot  Plain Jell-O in any flavor  (No red)                                     see through such as: Fruit ices (not with fruit pulp)                                      milk, soups, orange juice              Iced Popsicles (No red)                                      All solid food                                   Apple juices Sports drinks like Gatorade (No red) Lightly seasoned clear broth or consume(fat free) Sugar, honey syrup     Complete one G2 drink the morning of surgery at 4:15 AM  the day of surgery.   Oral Hygiene is also important to reduce your risk of infection.                                    Remember - BRUSH YOUR TEETH THE MORNING OF SURGERY WITH YOUR REGULAR TOOTHPASTE   Do NOT smoke  after Midnight   Take these  medicines the morning of surgery: Zoloft                               You may not have any metal on your body including hair pins, jewelry, and body piercings             Do not wear make-up, lotions, powders, perfumes or deodorant             Do not wear nail polish.  Do not shave  48 hours prior to surgery.               Do not bring valuables to the hospital. [place].   Contacts, dentures or bridgework may not be worn into surgery.   Bring small overnight bag day of surgery.                Please read over the following fact sheets you were given: IF YOU HAVE QUESTIONS ABOUT YOUR PRE OP INSTRUCTIONS PLEASE CALL [phone]   [facility] - Preparing for Surgery Before surgery, you can play an important role.  Because skin is not sterile, your skin needs to be as free of germs as possible.  You can reduce the number of germs on your skin by washing with CHG (chlorahexidine gluconate) soap before surgery.  CHG is an antiseptic cleaner which kills germs and bonds with the skin to continue killing germs even after washing. Please DO NOT use if you have an allergy to CHG or antibacterial soaps.  If your skin becomes reddened/irritated stop using the CHG and inform your nurse when you arrive at Short Stay. Do not shave (including legs and underarms) for at least 48 hours prior to the first CHG shower.  You may shave your face/neck.  Please follow these instructions carefully:  1.  Shower with CHG Soap the night before surgery and the  morning of surgery.  2.  If you choose to wash your hair, wash your hair first as usual with your normal  shampoo.  3.  After you shampoo, rinse your hair and body thoroughly to remove the shampoo.                             4.  Use CHG as you would any other liquid soap.  You can apply chg directly to the skin and wash.  Gently with a scrungie or clean washcloth.  5.  Apply the CHG Soap to  your body ONLY FROM THE NECK DOWN.   Do   not use on face/ open                           Wound or open sores. Avoid contact with eyes, ears mouth and   genitals (private parts).                       Wash face,  Genitals (private parts) with your normal soap.             6.  Wash thoroughly, paying special attention to the area where your    surgery  will be performed.  7.  Thoroughly rinse your body with warm water from the neck down.  8.  DO NOT shower/wash with your normal soap after using and rinsing off the  CHG Soap.                9.  Pat yourself dry with a clean towel.            10.  Wear clean pajamas.            11.  Place clean sheets on your bed the night of your first shower and do not  sleep with pets. Day of Surgery : Do not apply any lotions/deodorants the morning of surgery.  Please wear clean clothes to the hospital/surgery center.  FAILURE TO FOLLOW THESE INSTRUCTIONS MAY RESULT IN THE CANCELLATION OF YOUR SURGERY  PATIENT SIGNATURE_________________________________  NURSE SIGNATURE__________________________________  ________________________________________________________________________   Debbie Townsend  An incentive spirometer is a tool that can help keep your lungs clear and active. This tool measures how well you are filling your lungs with each breath. Taking long deep breaths may help reverse or decrease the chance of developing breathing (pulmonary) problems (especially infection) following:  A long period of time when you are unable to move or be active. BEFORE THE PROCEDURE   If the spirometer includes an indicator to show your best effort, your nurse or respiratory therapist will set it to a desired goal.  If possible, sit up straight or lean slightly forward. Try not to slouch.  Hold the incentive spirometer in an upright position. INSTRUCTIONS FOR USE  1. Sit on the edge of your bed if possible, or sit up as far as you can in bed or on a  chair. 2. Hold the incentive spirometer in an upright position. 3. Breathe out normally. 4. Place the mouthpiece in your mouth and seal your lips tightly around it. 5. Breathe in slowly and as deeply as possible, raising the piston or the ball toward the top of the column. 6. Hold your breath for 3-5 seconds or for as long as possible. Allow the piston or ball to fall to the bottom of the column. 7. Remove the mouthpiece from your mouth and breathe out normally. 8. Rest for a few seconds and repeat Steps 1 through 7 at least 10 times every 1-2 hours when you are awake. Take your time and take a few normal breaths between deep breaths. 9. The spirometer may include an indicator to show your best effort. Use the indicator as a goal to work toward during each repetition. 10. After each set of 10 deep breaths, practice coughing to be sure your lungs are clear. If you have an incision (the cut made at the time of surgery), support your incision when coughing by placing a pillow or rolled up towels firmly against it. Once you are able to get out of bed, walk around indoors and cough well. You may stop using the incentive spirometer when instructed by your caregiver.  RISKS AND COMPLICATIONS  Take your time so you do not get dizzy or light-headed.  If you are in pain, you may need to take or ask for pain medication before doing incentive spirometry. It is harder to take a deep breath if you are having pain. AFTER USE  Rest and breathe slowly and easily.  It can be helpful to keep track of a log of your progress. Your caregiver can provide you with a simple table to help with this. If you are using the spirometer at home, follow these instructions: [place] IF:   You are having difficultly using the spirometer.  You have trouble using the spirometer as often  as instructed.  Your pain medication is not giving enough relief while using the spirometer.  You develop fever of 100.5 F  (38.1 C) or higher. SEEK IMMEDIATE MEDICAL CARE IF:   You cough up bloody sputum that had not been present before.  You develop fever of 102 F (38.9 C) or greater.  You develop worsening pain at or near the incision site. MAKE SURE YOU:   Understand these instructions.  Will watch your condition.  Will get help right away if you are not doing well or get worse. Document Released: 04/14/2007 Document Revised: 02/24/2012 Document Reviewed: 06/15/2007 [employer] Patient Information 2014 [employer], [place].   ________________________________________________________________________  WHAT IS A BLOOD TRANSFUSION? Blood Transfusion Information  A transfusion is the replacement of blood or some of its parts. Blood is made up of multiple cells which provide different functions.  Red blood cells carry oxygen and are used for blood loss replacement.  White blood cells fight against infection.  Platelets control bleeding.  Plasma helps clot blood.  Other blood products are available for specialized needs, such as hemophilia or other clotting disorders. BEFORE THE TRANSFUSION  Who gives blood for transfusions?   Healthy volunteers who are fully evaluated to make sure their blood is safe. This is blood bank blood. Transfusion therapy is the safest it has ever been in the practice of medicine. Before blood is taken from a donor, a complete history is taken to make sure that person has no history of diseases nor engages in risky social behavior (examples are intravenous drug use or sexual activity with multiple partners). The donor's travel history is screened to minimize risk of transmitting infections, such as malaria. The donated blood is tested for signs of infectious diseases, such as HIV and hepatitis. The blood is then tested to be sure it is compatible with you in order to minimize the chance of a transfusion reaction. If you or a relative donates blood, this is often done in anticipation  of surgery and is not appropriate for emergency situations. It takes many days to process the donated blood. RISKS AND COMPLICATIONS Although transfusion therapy is very safe and saves many lives, the main dangers of transfusion include:   Getting an infectious disease.  Developing a transfusion reaction. This is an allergic reaction to something in the blood you were given. Every precaution is taken to prevent this. The decision to have a blood transfusion has been considered carefully by your caregiver before blood is given. Blood is not given unless the benefits outweigh the risks. AFTER THE TRANSFUSION  Right after receiving a blood transfusion, you will usually feel much better and more energetic. This is especially true if your red blood cells have gotten low (anemic). The transfusion raises the level of the red blood cells which carry oxygen, and this usually causes an energy increase.  The nurse administering the transfusion will monitor you carefully for complications. HOME CARE INSTRUCTIONS  No special instructions are needed after a transfusion. You may find your energy is better. Speak with your caregiver about any limitations on activity for underlying diseases you may have. SEEK MEDICAL CARE IF:   Your condition is not improving after your transfusion.  You develop redness or irritation at the intravenous (IV) site. SEEK IMMEDIATE MEDICAL CARE IF:  Any of the following symptoms occur over the next 12 hours:  Shaking chills.  You have a temperature by mouth above 102 F (38.9 C), not controlled by medicine.  Chest, back, or muscle  pain.  People around you feel you are not acting correctly or are confused.  Shortness of breath or difficulty breathing.  Dizziness and fainting.  You get a rash or develop hives.  You have a decrease in urine output.  Your urine turns a dark color or changes to pink, red, or brown. Any of the following symptoms occur over the next 10  days:  You have a temperature by mouth above 102 F (38.9 C), not controlled by medicine.  Shortness of breath.  Weakness after normal activity.  The white part of the eye turns yellow (jaundice).  You have a decrease in the amount of urine or are urinating less often.  Your urine turns a dark color or changes to pink, red, or brown. Document Released: 11/29/2000 Document Revised: 02/24/2012 Document Reviewed: 07/18/2008 [employer] Patient Information 2014 [place], [place].  _______________________________________________________________________

## 2020-11-02 NOTE — Progress Notes (Signed)
COVID Vaccine Completed: Date COVID Vaccine completed: COVID vaccine manufacturer: [employer]   PCP - [name] McLean-Scocuzza, MD Cardiologist -   Chest x-ray - 08-04-20 in Epic EKG - 08-04-20 in Epic Stress Test -  ECHO -  Cardiac Cath -  Pacemaker/ICD device last checked:  Sleep Study -  CPAP -   Fasting Blood Sugar -  Checks Blood Sugar _____ times a day  Blood Thinner Instructions: Aspirin Instructions: Last Dose:  Anesthesia review:   Patient denies shortness of breath, fever, cough and chest pain at PAT appointment   Patient verbalized understanding of instructions that were given to them at the PAT appointment. Patient was also instructed that they will need to review over the PAT instructions again at home before surgery.

## 2020-11-03 ENCOUNTER — Other Ambulatory Visit: Payer: Self-pay

## 2020-11-03 ENCOUNTER — Encounter: Payer: Self-pay | Admitting: Internal Medicine

## 2020-11-03 ENCOUNTER — Encounter (HOSPITAL_COMMUNITY)
Admission: RE | Admit: 2020-11-03 | Discharge: 2020-11-03 | Disposition: A | Discharge status: Home / Self Care | Payer: BC Managed Care – PPO | Source: Ambulatory Visit | Attending: Surgery | Admitting: Surgery

## 2020-11-03 ENCOUNTER — Ambulatory Visit (INDEPENDENT_AMBULATORY_CARE_PROVIDER_SITE_OTHER): Payer: BC Managed Care – PPO | Admitting: Internal Medicine

## 2020-11-03 ENCOUNTER — Encounter (HOSPITAL_COMMUNITY): Payer: Self-pay

## 2020-11-03 ENCOUNTER — Ambulatory Visit: Payer: BC Managed Care – PPO | Admitting: Dietician

## 2020-11-03 VITALS — BP 118/70 | HR 91 | Temp 98.0°F | Ht 66.0 in | Wt 279.2 lb

## 2020-11-03 DIAGNOSIS — Z01812 Encounter for preprocedural laboratory examination: Secondary | ICD-10-CM | POA: Diagnosis not present

## 2020-11-03 DIAGNOSIS — Z803 Family history of malignant neoplasm of breast: Secondary | ICD-10-CM | POA: Diagnosis not present

## 2020-11-03 DIAGNOSIS — I10 Essential (primary) hypertension: Secondary | ICD-10-CM

## 2020-11-03 DIAGNOSIS — Z0000 Encounter for general adult medical examination without abnormal findings: Secondary | ICD-10-CM | POA: Diagnosis not present

## 2020-11-03 DIAGNOSIS — Z1231 Encounter for screening mammogram for malignant neoplasm of breast: Secondary | ICD-10-CM | POA: Diagnosis not present

## 2020-11-03 DIAGNOSIS — E611 Iron deficiency: Secondary | ICD-10-CM

## 2020-11-03 DIAGNOSIS — G43011 Migraine without aura, intractable, with status migrainosus: Secondary | ICD-10-CM | POA: Diagnosis not present

## 2020-11-03 DIAGNOSIS — G932 Benign intracranial hypertension: Secondary | ICD-10-CM

## 2020-11-03 DIAGNOSIS — K760 Fatty (change of) liver, not elsewhere classified: Secondary | ICD-10-CM

## 2020-11-03 DIAGNOSIS — R7303 Prediabetes: Secondary | ICD-10-CM

## 2020-11-03 DIAGNOSIS — R197 Diarrhea, unspecified: Secondary | ICD-10-CM

## 2020-11-03 DIAGNOSIS — Z6841 Body Mass Index (BMI) 40.0 and over, adult: Secondary | ICD-10-CM

## 2020-11-03 HISTORY — DX: Iron deficiency: E61.1

## 2020-11-03 LAB — TYPE AND SCREEN
ABO/RH(D): A POS
Antibody Screen: NEGATIVE

## 2020-11-03 MED ORDER — METHYLPREDNISOLONE ACETATE 40 MG/ML IJ SUSP
40.0000 mg | Freq: Once | INTRAMUSCULAR | Status: AC
  Administered 2020-11-03: 40 mg via INTRAMUSCULAR

## 2020-11-03 MED ORDER — PROMETHAZINE HCL 12.5 MG PO TABS: 12.5000 mg | ORAL_TABLET | Freq: Two times a day (BID) | ORAL | 2 refills | Status: DC | PRN

## 2020-11-03 NOTE — Progress Notes (Signed)
Chief Complaint  Patient presents with  . Follow-up   Annual  1. Today she has 8/10 migraine with sensitivity to light and nausea with nurtec and aimovig which took the end of last week migraines controlled getting 3 x per month but has had h/a since end of last week to now and called [facility] who Rx phenerghan she also f/u with them for ICH on diamox qd and will f/u [facility] 4/22 2. ICH pending wt loss surgery gastric sleeve 11/14/20 and will be on celebrate vitamins with iron, vitamin D, b12 after weight loss surgery reviewed A1C 6.4 and wt loss surgery can help with this  3 diarrhea s/p GB removal stool tests negative taking Immodium 4 mg causing constipation will try 2 mg prn disc questran she declines to take further meds  4. HTN controlled on lis 20 mg qd   Review of Systems  Constitutional: Negative for weight loss.  HENT: Negative for hearing loss.   Eyes: Negative for blurred vision.  Respiratory: Negative for shortness of breath.   Cardiovascular: Negative for chest pain.  Gastrointestinal: Positive for nausea.  Musculoskeletal: Negative for falls and joint pain.  Skin: Negative for rash.  Neurological: Positive for headaches.  Psychiatric/Behavioral: Negative for depression.   Past Medical History:  Diagnosis Date  . Anxiety   . BRCA negative   . Depression   . Family history of anesthesia complication     mother had Post -op nausea and dizziness.  . Foot drop, left foot   . GERD (gastroesophageal reflux disease)   . Headache   . History of kidney stones   . Hypertension   . Kidney stone   . Pseudotumor cerebri 2007 or 2009   Past Surgical History:  Procedure Laterality Date  . ABDOMINAL HYSTERECTOMY    . CESAREAN SECTION     2009/2017  . CHOLECYSTECTOMY N/A 02/04/2019   Procedure: LAPAROSCOPIC CHOLECYSTECTOMY;  Surgeon: Fredirick Maudlin, MD;  Location: [facility] ORS;  Service: General;  Laterality: N/A;  . LUMBAR LAMINECTOMY/DECOMPRESSION MICRODISCECTOMY  Left 09/22/2014   Procedure: Left Lumbar four-five microdiskectomy;  Surgeon: Consuella Lose, MD;  Location: [facility];  Service: Neurosurgery;  Laterality: Left;  Left Lumbar four-five microdiskectomy  . WISDOM TOOTH EXTRACTION     Family History  Problem Relation Age of Onset  . Cancer Mother        breast dx'ed age 38/49  . Hypertension Mother   . Diabetes Mother   . Hypothyroidism Mother   . Depression Mother   . Breast cancer Mother        15s  . Liver disease Father   . Heart attack Father   . Arthritis Father   . Alcohol abuse Father   . Depression Father   . Drug abuse Father   . Early death Father   . Heart disease Father   . Hypertension Father   . Hepatitis C Father   . Depression Sister   . Hypertension Sister   . Arthritis Sister   . Hypertension Sister   . Cancer Maternal Grandmother        stomach>liver>brain met  . Cancer Maternal Uncle        lung not a smoker    Social History   Socioeconomic History  . Marital status: Married    Spouse name: Not on file  . Number of children: 2  . Years of education: Not on file  . Highest education level: Not on file  Occupational History  .  Not on file  Tobacco Use  . Smoking status: Former Smoker    Packs/day: 0.50    Types: Cigarettes  . Smokeless tobacco: Never Used  Vaping Use  . Vaping Use: Never used  Substance and Sexual Activity  . Alcohol use: Not Currently  . Drug use: No  . Sexual activity: Yes    Birth control/protection: Rhythm  Other Topics Concern  . 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 Difficulty of Paying Living Expenses: Not on file  Food Insecurity:   . Worried About Charity fundraiser in the Last Year: Not on file  . Ran Out of Food in the Last Year: Not on  file  Transportation Needs:   . Lack of Transportation (Medical): Not on file  . Lack of Transportation (Non-Medical): Not on file  Physical Activity:   . Days of Exercise per Week: Not on file  . Minutes of Exercise per Session: Not on file  Stress:   . Feeling of Stress : Not on file  Social Connections:   . Frequency of Communication with Friends and Family: Not on file  . Frequency of Social Gatherings with Friends and Family: Not on file  . Attends Religious Services: Not on file  . Active Member of Clubs or Organizations: Not on file  . Attends Archivist Meetings: Not on file  . Marital Status: Not on file  Intimate Partner Violence:   . Fear of Current or Ex-Partner: Not on file  . Emotionally Abused: Not on file  . Physically Abused: Not on file  . Sexually Abused: Not on file   Current Meds  Medication Sig  . acetaZOLAMIDE (DIAMOX) 500 MG capsule Take 1,000 mg by mouth in the morning.  Marland Kitchen AIMOVIG 140 MG/ML SOAJ Take 140 mg by mouth every 30 (thirty) days.   Marland Kitchen albuterol (VENTOLIN HFA) 108 (90 Base) MCG/ACT inhaler Inhale 1-2 puffs into the lungs every 4 (four) hours as needed for wheezing or shortness of breath.  . famotidine (PEPCID) 20 MG tablet Take 20 mg by mouth daily.   . famotidine (ZANTAC 360) 10 MG tablet Take 10 mg by mouth daily.  Marland Kitchen levocetirizine (XYZAL) 5 MG tablet Take 5 mg by mouth every evening.   Marland Kitchen lisinopril (ZESTRIL) 20 MG tablet Take 1 tablet (20 mg total) by mouth daily.  Marland Kitchen OVER THE COUNTER MEDICATION Take 1 tablet by mouth at bedtime. Tranquil Sleep  . Rimegepant Sulfate (NURTEC PO) Take 75 mg by mouth. Once daily prn abortive for migraines  . sertraline (ZOLOFT) 100 MG tablet Take 1 tablet (100 mg total) by mouth daily.   Allergies  Allergen Reactions  . Apple Other (See Comments)    Lips itch and swell, inside of mouth gets hives  . Carrot [Daucus Carota] Other (See Comments)    Lips itch and swell, inside of mouth gets hives  .  Monistat [Miconazole] Swelling    Severe vaginal itching and swelling  . Other Itching and Swelling    Almonds-mouth swells and itches  . Almond Oil Hives  . Amoxicillin Hives, Itching, Swelling and Dermatitis    Did it involve swelling of the face/tongue/throat, SOB, or  low BP? No Did it involve sudden or severe rash/hives, skin peeling, or any reaction on the inside of your mouth or nose? Yes Did you need to seek medical attention at a hospital or doctor's office? Yes When did it last happen?within the past 5 years If all above answers are "NO", may proceed with cephalosporin use.   . Imitrex [Sumatriptan]     Felt like heart attack   . Topamax [Topiramate]     Memory loss     Recent Results (from the past 2160 hour(s))  C Difficile Quick Screen w PCR reflex     Status: None   Collection Time: 10/09/20  4:49 PM   Specimen: STOOL  Result Value Ref Range   C Diff antigen NEGATIVE NEGATIVE   C Diff toxin NEGATIVE NEGATIVE   C Diff interpretation No C. difficile detected.     Comment: Performed at [facility], [address] [place], [place] [address]  Gastrointestinal Panel by PCR , Stool     Status: None   Collection Time: 10/09/20  4:50 PM   Specimen: Stool  Result Value Ref Range   Campylobacter species NOT DETECTED NOT DETECTED   Plesimonas shigelloides NOT DETECTED NOT DETECTED   Salmonella species NOT DETECTED NOT DETECTED   Yersinia enterocolitica NOT DETECTED NOT DETECTED   Vibrio species NOT DETECTED NOT DETECTED   Vibrio cholerae NOT DETECTED NOT DETECTED   Enteroaggregative E coli (EAEC) NOT DETECTED NOT DETECTED   Enteropathogenic E coli (EPEC) NOT DETECTED NOT DETECTED   Enterotoxigenic E coli (ETEC) NOT DETECTED NOT DETECTED   Shiga like toxin producing E coli (STEC) NOT DETECTED NOT DETECTED   Shigella/Enteroinvasive E coli (EIEC) NOT DETECTED NOT DETECTED   Cryptosporidium NOT DETECTED NOT DETECTED   Cyclospora cayetanensis NOT DETECTED NOT  DETECTED   Entamoeba histolytica NOT DETECTED NOT DETECTED   Giardia lamblia NOT DETECTED NOT DETECTED   Adenovirus F40/41 NOT DETECTED NOT DETECTED   Astrovirus NOT DETECTED NOT DETECTED   Norovirus GI/GII NOT DETECTED NOT DETECTED   Rotavirus A NOT DETECTED NOT DETECTED   Sapovirus (I, II, IV, and V) NOT DETECTED NOT DETECTED    Comment: Performed at [facility], [address] [place], [place] [address]  Hepatitis C antibody     Status: None   Collection Time: 11/01/20  8:46 AM  Result Value Ref Range   Hepatitis C Ab NON-REACTIVE NON-REACTI   SIGNAL TO CUT-OFF 0.00 <1.00    Comment: . HCV antibody was non-reactive. There is no laboratory  evidence of HCV infection. . In most cases, no further action is required. However, if recent HCV exposure is suspected, a test for HCV RNA (test code [phone]) is suggested. . For additional information please refer to http://education.[facility].com/faq/FAQ22v1 (This link is being provided for informational/ educational purposes only.) .   Iron, TIBC and Ferritin Panel     Status: Abnormal   Collection Time: 11/01/20  8:46 AM  Result Value Ref Range   Iron 51 40 - 190 mcg/dL   TIBC 333 250 - 450 mcg/dL (calc)   %SAT 15 (L) 16 - 45 % (calc)   Ferritin 74 16 - 154 ng/mL  Hepatitis A Ab, Total     Status: Abnormal   Collection Time: 11/01/20  8:46 AM  Result Value Ref Range   Hepatitis A AB,Total REACTIVE (A) NON-REACTI    Comment: . For additional information, please refer to  http://education.[facility].com/faq/FAQ202  (This link is being provided for informational/  educational purposes only.) .   Hepatitis B surface antibody,quantitative     Status: None   Collection Time: 11/01/20  8:46 AM  Result Value Ref Range   Hepatitis B-Post 52 > OR = 10 mIU/mL    Comment: . Patient has immunity to hepatitis B virus. . For additional information, please refer  to http://education.[facility].com/faq/FAQ105 (This link is being provided for informational/ educational purposes only).   Hemoglobin A1c     Status: None   Collection Time: 11/01/20  8:46 AM  Result Value Ref Range   Hgb A1c MFr Bld 6.4 4.6 - 6.5 %    Comment: Glycemic Control Guidelines for People with Diabetes:Non Diabetic:  <6%Goal of Therapy: <7%Additional Action Suggested:  >8%   Urinalysis, Routine w reflex microscopic     Status: None   Collection Time: 11/01/20  8:46 AM  Result Value Ref Range   Color, Urine YELLOW YELLOW   APPearance CLEAR CLEAR   Specific Gravity, Urine 1.013 1.001 - 1.03   pH 6.0 5.0 - 8.0   Glucose, UA NEGATIVE NEGATIVE   Bilirubin Urine NEGATIVE NEGATIVE   Ketones, ur NEGATIVE NEGATIVE   Hgb urine dipstick NEGATIVE NEGATIVE   Protein, ur NEGATIVE NEGATIVE   Nitrite NEGATIVE NEGATIVE   Leukocytes,Ua NEGATIVE NEGATIVE  T4, free     Status: None   Collection Time: 11/01/20  8:46 AM  Result Value Ref Range   Free T4 0.74 0.60 - 1.60 ng/dL    Comment: Specimens from patients who are undergoing biotin therapy and /or ingesting biotin supplements may contain high levels of biotin.  The higher biotin concentration in these specimens interferes with this Free T4 assay.  Specimens that contain high levels  of biotin may cause false high results for this Free T4 assay.  Please interpret results in light of the total clinical presentation of the patient.    TSH     Status: None   Collection Time: 11/01/20  8:46 AM  Result Value Ref Range   TSH 2.42 0.35 - 4.50 uIU/mL  CBC with Differential/Platelet     Status: Abnormal   Collection Time: 11/01/20  8:46 AM  Result Value Ref Range   WBC 8.8 4.0 - 10.5 K/uL   RBC 5.15 (H) 3.87 - 5.11 Mil/uL   Hemoglobin 14.4 12.0 - 15.0 g/dL   HCT 43.6 36 - 46 %   MCV 84.7 78.0 - 100.0 fl   MCHC 33.1 30.0 - 36.0 g/dL   RDW 14.1 11.5 - 15.5 %   Platelets 293.0 150 - 400 K/uL   Neutrophils Relative % 73.9 43 - 77 %    Lymphocytes Relative 18.4 12 - 46 %   Monocytes Relative 5.2 3 - 12 %   Eosinophils Relative 1.8 0 - 5 %   Basophils Relative 0.7 0 - 3 %   Neutro Abs 6.5 1.4 - 7.7 K/uL   Lymphs Abs 1.6 0.7 - 4.0 K/uL   Monocytes Absolute 0.5 0.1 - 1.0 K/uL   Eosinophils Absolute 0.2 0.0 - 0.7 K/uL   Basophils Absolute 0.1 0.0 - 0.1 K/uL  Comprehensive metabolic panel     Status: Abnormal   Collection Time: 11/01/20  8:46 AM  Result Value Ref Range   Sodium 137 135 - 145 mEq/L   Potassium 4.2 3.5 - 5.1 mEq/L   Chloride 106 96 - 112 mEq/L   CO2 22 19 - 32 mEq/L   Glucose, Bld 117 (H) 70 - 99 mg/dL   BUN 9  6 - 23 mg/dL   Creatinine, Ser 0.75 0.40 - 1.20 mg/dL   Total Bilirubin 0.4 0.2 - 1.2 mg/dL   Alkaline Phosphatase 48 39 - 117 U/L   AST 18 0 - 37 U/L   ALT 31 0 - 35 U/L   Total Protein 6.6 6.0 - 8.3 g/dL   Albumin 4.0 3.5 - 5.2 g/dL   GFR 103.38 >60.00 mL/min    Comment: Calculated using the CKD-EPI Creatinine Equation (2021)   Calcium 9.2 8.4 - 10.5 mg/dL  Lipid panel     Status: None   Collection Time: 11/01/20  8:46 AM  Result Value Ref Range   Cholesterol 137 0 - 200 mg/dL    Comment: ATP III Classification       Desirable:  < 200 mg/dL               Borderline High:  200 - 239 mg/dL          High:  > = 240 mg/dL   Triglycerides 85.0 0 - 149 mg/dL    Comment: Normal:  <150 mg/dLBorderline High:  150 - 199 mg/dL   HDL 46.80 >39.00 mg/dL   VLDL 17.0 0.0 - 40.0 mg/dL   LDL Cholesterol 73 0 - 99 mg/dL   Total CHOL/HDL Ratio 3     Comment:                Men          Women1/2 Average Risk     3.4          3.3Average Risk          5.0          4.42X Average Risk          9.6          7.13X Average Risk          15.0          11.0                       NonHDL 90.30     Comment: NOTE:  Non-HDL goal should be 30 mg/dL higher than patient's LDL goal (i.e. LDL goal of < 70 mg/dL, would have non-HDL goal of < 100 mg/dL)  Type and screen     Status: None   Collection Time: 11/03/20 11:16 AM   Result Value Ref Range   ABO/RH(D) A POS    Antibody Screen NEG    Sample Expiration 11/17/2020,2359    Extend sample reason      NO TRANSFUSIONS OR PREGNANCY IN THE PAST 3 MONTHS Performed at [facility], [place] [address] [place], [place] [address]    Objective  Body mass index is 45.06 kg/m. Wt Readings from Last 3 Encounters:  11/03/20 279 lb 3.2 oz (126.6 kg)  10/17/20 284 lb 8 oz (129 kg)  10/03/20 277 lb 14.4 oz (126.1 kg)   Temp Readings from Last 3 Encounters:  11/03/20 98 F (36.7 C) (Oral)  11/03/20 98.2 F (36.8 C) (Oral)  06/30/20 98.3 F (36.8 C) (Oral)   BP Readings from Last 3 Encounters:  11/03/20 118/70  11/03/20 128/79  06/30/20 110/82   Pulse Readings from Last 3 Encounters:  11/03/20 91  11/03/20 82  06/30/20 85    Physical Exam Vitals and nursing note reviewed.  Constitutional:      Appearance: Normal appearance. She is well-developed and well-groomed. She is morbidly obese.  HENT:     Head: Normocephalic and atraumatic.  Eyes:     Conjunctiva/sclera: Conjunctivae normal.     Pupils: Pupils are equal, round, and reactive to light.  Cardiovascular:     Rate and Rhythm: Normal rate and regular rhythm.     Heart sounds: Normal heart sounds. No murmur heard.   Pulmonary:     Effort: Pulmonary effort is normal.     Breath sounds: Normal breath sounds.  Skin:    General: Skin is warm and dry.  Neurological:     General: No focal deficit present.     Mental Status: She is alert and oriented to person, place, and time. Mental status is at baseline.     Gait: Gait normal.  Psychiatric:        Attention and Perception: Attention and perception normal.        Mood and Affect: Mood and affect normal.        Speech: Speech normal.        Behavior: Behavior normal. Behavior is cooperative.        Thought Content: Thought content normal.        Cognition and Memory: Cognition and memory normal.        Judgment: Judgment  normal.     Assessment  Plan  Annual physical exam Fasting labs11/2021 utd Declines flu shot Tdap had 01/12/16  Immune hep A/B  No hep C father had hep C from IVDU declines covid19 vaccine  FH breast cancer mom 58 yearly mammogram referred due 10/2020 GIB Colonoscopy age 53 HIV 01/04/16 neg   -had pap 04/23/18 neg pap neg HPV -ob/ygn Dr. Kenton Kingfisher  rec healthy diet and exercise    Intractable migraine without aura and with status migrainosus with ICH- Plan: promethazine (PHENERGAN) 12.5 MG tablet, methylPREDNISolone acetate (DEPO-MEDROL) injection 40 mg x 1 and h/a improved 8/10 to 5/10  F/u [facility] 4/22  Prn nurtec and aimovig as directed   Diarrhea, unspecified type Not infectious rec prn imodium 16m  OSheria Langdeclined for now she is s/p GB removal   Morbid obesity with BMI of 45.0-49.9, adult ([place] Prediabetes 11/14/20 gastric sleeve surgery sch   Iron deficiency -will be on iron after wt loss surgery   Essential hypertension Controlled on lis 20 mg qd   Fatty liver  Hep A/B immune   Specialists  [facility] 07/05/20 and [facility]. SManuella Ghazinow [facility]  [facility]20 Dermatology Dr. lUbaldo Glassingin GFrench [facility] SurgeryDr. CRomana Juniper  Provider: Dr. TOlivia MackieMcLean-Scocuzza-Internal Medicine

## 2020-11-03 NOTE — Patient Instructions (Addendum)
2 mg immodium  Or consider questran for diarrhea   Results for Debbie Townsend, Debbie Townsend (MRN [id number]) as of 11/03/2020 15:34  Ref. Range 11/01/2020 08:46  Iron Latest Ref Range: 40 - 190 mcg/dL 51  TIBC Latest Ref Range: 250 - 450 mcg/dL (calc) 333  %SAT Latest Ref Range: 16 - 45 % (calc) 15 (L)  Ferritin Latest Ref Range: 16 - 154 ng/mL 74  polyiron 150 mg daily  integra iron with vitamin C  Ferrous sulfate or gluconate  Or vitamins from weight loss clinic  Or  Celebrate vitamins as well    Migraine Headache A migraine headache is an intense, throbbing pain on one side or both sides of the head. Migraine headaches may also cause other symptoms, such as nausea, vomiting, and sensitivity to light and noise. A migraine headache can last from 4 hours to 3 days. Talk with your doctor about what things may bring on (trigger) your migraine headaches. What are the causes? The exact cause of this condition is not known. However, a migraine may be caused when nerves in the brain become irritated and release chemicals that cause inflammation of blood vessels. This inflammation causes pain. This condition may be triggered or caused by:  Drinking alcohol.  Smoking.  Taking medicines, such as: ? Medicine used to treat chest pain (nitroglycerin). ? Birth control pills. ? Estrogen. ? Certain blood pressure medicines.  Eating or drinking products that contain nitrates, glutamate, aspartame, or tyramine. Aged cheeses, chocolate, or caffeine may also be triggers.  Doing physical activity. Other things that may trigger a migraine headache include:  Menstruation.  Pregnancy.  Hunger.  Stress.  Lack of sleep or too much sleep.  Weather changes.  Fatigue. What increases the risk? The following factors may make you more likely to experience migraine headaches:  Being a certain age. This condition is more common in people who are 39-41 years old.  Being female.  Having a family history of  migraine headaches.  Being Caucasian.  Having a mental health condition, such as depression or anxiety.  Being obese. What are the signs or symptoms? The main symptom of this condition is pulsating or throbbing pain. This pain may:  Happen in any area of the head, such as on one side or both sides.  Interfere with daily activities.  Get worse with physical activity.  Get worse with exposure to bright lights or loud noises. Other symptoms may include:  Nausea.  Vomiting.  Dizziness.  General sensitivity to bright lights, loud noises, or smells. Before you get a migraine headache, you may get warning signs (an aura). An aura may include:  Seeing flashing lights or having blind spots.  Seeing bright spots, halos, or zigzag lines.  Having tunnel vision or blurred vision.  Having numbness or a tingling feeling.  Having trouble talking.  Having muscle weakness. Some people have symptoms after a migraine headache (postdromal phase), such as:  Feeling tired.  Difficulty concentrating. How is this diagnosed? A migraine headache can be diagnosed based on:  Your symptoms.  A physical exam.  Tests, such as: ? CT scan or an MRI of the head. These imaging tests can help rule out other causes of headaches. ? Taking fluid from the spine (lumbar puncture) and analyzing it (cerebrospinal fluid analysis, or CSF analysis). How is this treated? This condition may be treated with medicines that:  Relieve pain.  Relieve nausea.  Prevent migraine headaches. Treatment for this condition may also include:  Acupuncture.  Lifestyle changes like avoiding foods that trigger migraine headaches.  Biofeedback.  Cognitive behavioral therapy. Follow these instructions at home: Medicines  Take over-the-counter and prescription medicines only as told by your health care provider.  Ask your health care provider if the medicine prescribed to you: ? Requires you to avoid driving  or using heavy machinery. ? Can cause constipation. You may need to take these actions to prevent or treat constipation:  Drink enough fluid to keep your urine pale yellow.  Take over-the-counter or prescription medicines.  Eat foods that are high in fiber, such as beans, whole grains, and fresh fruits and vegetables.  Limit foods that are high in fat and processed sugars, such as fried or sweet foods. Lifestyle  Do not drink alcohol.  Do not use any products that contain nicotine or tobacco, such as cigarettes, e-cigarettes, and chewing tobacco. If you need help quitting, ask your health care provider.  Get at least 8 hours of sleep every night.  Find ways to manage stress, such as meditation, deep breathing, or yoga. General instructions      Keep a journal to find out what may trigger your migraine headaches. For example, write down: ? What you eat and drink. ? How much sleep you get. ? Any change to your diet or medicines.  If you have a migraine headache: ? Avoid things that make your symptoms worse, such as bright lights. ? It may help to lie down in a dark, quiet room. ? Do not drive or use heavy machinery. ? Ask your health care provider what activities are safe for you while you are experiencing symptoms.  Keep all follow-up visits as told by your health care provider. This is important. Contact a health care provider if:  You develop symptoms that are different or more severe than your usual migraine headache symptoms.  You have more than 15 headache days in one month. Get help right away if:  Your migraine headache becomes severe.  Your migraine headache lasts longer than 72 hours.  You have a fever.  You have a stiff neck.  You have vision loss.  Your muscles feel weak or like you cannot control them.  You start to lose your balance often.  You have trouble walking.  You faint.  You have a seizure. Summary  A migraine headache is an intense,  throbbing pain on one side or both sides of the head. Migraines may also cause other symptoms, such as nausea, vomiting, and sensitivity to light and noise.  This condition may be treated with medicines and lifestyle changes. You may also need to avoid certain things that trigger a migraine headache.  Keep a journal to find out what may trigger your migraine headaches.  Contact your health care provider if you have more than 15 headache days in a month or you develop symptoms that are different or more severe than your usual migraine headache symptoms. This information is not intended to replace advice given to you by your health care provider. Make sure you discuss any questions you have with your health care provider. Document Revised: 03/26/2019 Document Reviewed: 01/14/2019 Elsevier Patient Education  [place].  Diarrhea, Adult Diarrhea is frequent loose and watery bowel movements. Diarrhea can make you feel weak and cause you to become dehydrated. Dehydration can make you tired and thirsty, cause you to have a dry mouth, and decrease how often you urinate. Diarrhea typically lasts 2-3 days. However, it can last longer if it  is a sign of something more serious. It is important to treat your diarrhea as told by your health care provider. Follow these instructions at home: Eating and drinking     Follow these recommendations as told by your health care provider:  Take an oral rehydration solution (ORS). This is an over-the-counter medicine that helps return your body to its normal balance of nutrients and water. It is found at pharmacies and retail stores.  Drink plenty of fluids, such as water, ice chips, diluted fruit juice, and low-calorie sports drinks. You can drink milk also, if desired.  Avoid drinking fluids that contain a lot of sugar or caffeine, such as energy drinks, sports drinks, and soda.  Eat bland, easy-to-digest foods in small amounts as you are able. These  foods include bananas, applesauce, rice, lean meats, toast, and crackers.  Avoid alcohol.  Avoid spicy or fatty foods.  Medicines  Take over-the-counter and prescription medicines only as told by your health care provider.  If you were prescribed an antibiotic medicine, take it as told by your health care provider. Do not stop using the antibiotic even if you start to feel better. General instructions   Wash your hands often using soap and water. If soap and water are not available, use a hand sanitizer. Others in the household should wash their hands as well. Hands should be washed: ? After using the toilet or changing a diaper. ? Before preparing, cooking, or serving food. ? While caring for a sick person or while visiting someone in a hospital.  Drink enough fluid to keep your urine pale yellow.  Rest at home while you recover.  Watch your condition for any changes.  Take a warm bath to relieve any burning or pain from frequent diarrhea episodes.  Keep all follow-up visits as told by your health care provider. This is important. Contact a health care provider if:  You have a fever.  Your diarrhea gets worse.  You have new symptoms.  You cannot keep fluids down.  You feel light-headed or dizzy.  You have a headache.  You have muscle cramps. Get help right away if:  You have chest pain.  You feel extremely weak or you faint.  You have bloody or black stools or stools that look like tar.  You have severe pain, cramping, or bloating in your abdomen.  You have trouble breathing or you are breathing very quickly.  Your heart is beating very quickly.  Your skin feels cold and clammy.  You feel confused.  You have signs of dehydration, such as: ? Dark urine, very little urine, or no urine. ? Cracked lips. ? Dry mouth. ? Sunken eyes. ? Sleepiness. ? Weakness. Summary  Diarrhea is frequent loose and watery bowel movements. Diarrhea can make you feel weak  and cause you to become dehydrated.  Drink enough fluids to keep your urine pale yellow.  Make sure that you wash your hands after using the toilet. If soap and water are not available, use hand sanitizer.  Contact a health care provider if your diarrhea gets worse or you have new symptoms.  Get help right away if you have signs of dehydration. This information is not intended to replace advice given to you by your health care provider. Make sure you discuss any questions you have with your health care provider. Document Revised: 04/20/2019 Document Reviewed: 05/08/2018 Elsevier Patient Education  [place].

## 2020-11-07 NOTE — Progress Notes (Signed)
Seen 10/03/20  Diarrhea since 09/29/20 6-8 x per day with hunter green yellow loose stools sometimes fatigue nausea and glower to middle abdominal cramps. Denies GIB, covid 19 exposure, fever. Tried soup/crackers and peptobismol. Denies sick contacts, Abx, no problems with dairy. She has 1 pet 35 y.o dog w/o diarrhea, no other household contacts have diarrhea  11/03/20 still having diarrhea 2-3 x per day   Ruled out not infectious cause given long duration of diarrhea since 09/29/20 which is why I ordered C diff and Gi pathogen panel tests   Dr. [name] McLean-Scocuzza

## 2020-11-07 NOTE — Addendum Note (Signed)
Encounter addended by: McLean-ScocuzzaNino Glow, MD on: 11/07/2020 4:23 PM  Actions taken: Clinical Note Signed

## 2020-11-10 ENCOUNTER — Other Ambulatory Visit (HOSPITAL_COMMUNITY): Payer: BC Managed Care – PPO

## 2020-11-11 DIAGNOSIS — Z20822 Contact with and (suspected) exposure to covid-19: Secondary | ICD-10-CM | POA: Diagnosis not present

## 2020-11-11 DIAGNOSIS — Z03818 Encounter for observation for suspected exposure to other biological agents ruled out: Secondary | ICD-10-CM | POA: Diagnosis not present

## 2020-11-13 ENCOUNTER — Encounter (HOSPITAL_COMMUNITY): Payer: Self-pay | Admitting: Surgery

## 2020-11-13 MED ORDER — BUPIVACAINE LIPOSOME 1.3 % IJ SUSP
20.0000 mL | Freq: Once | INTRAMUSCULAR | Status: DC
  Filled 2020-11-13: qty 20

## 2020-11-13 NOTE — Anesthesia Preprocedure Evaluation (Addendum)
Anesthesia Evaluation  Patient identified by MRN, date of birth, ID band Patient awake    Reviewed: Allergy & Precautions, NPO status , Patient's Chart, lab work & pertinent test results  Airway Mallampati: II  TM Distance: >3 FB Neck ROM: Full    Dental  (+) Dental Advisory Given   Pulmonary former smoker,    breath sounds clear to auscultation       Cardiovascular hypertension, Pt. on medications  Rhythm:Regular Rate:Normal     Neuro/Psych  Headaches,    GI/Hepatic Neg liver ROS, GERD  ,  Endo/Other  negative endocrine ROS  Renal/GU negative Renal ROS     Musculoskeletal   Abdominal   Peds  Hematology negative hematology ROS (+)   Anesthesia Other Findings   Reproductive/Obstetrics                            Lab Results  Component Value Date   WBC 8.8 11/01/2020   HGB 14.4 11/01/2020   HCT 43.6 11/01/2020   MCV 84.7 11/01/2020   PLT 293.0 11/01/2020   Lab Results  Component Value Date   CREATININE 0.75 11/01/2020   BUN 9 11/01/2020   NA 137 11/01/2020   K 4.2 11/01/2020   CL 106 11/01/2020   CO2 22 11/01/2020    Anesthesia Physical Anesthesia Plan  ASA: III  Anesthesia Plan: General   Post-op Pain Management:    Induction: Intravenous  PONV Risk Score and Plan: 3 and Midazolam, Dexamethasone, Ondansetron and Treatment may vary due to age or medical condition  Airway Management Planned: Oral ETT  Additional Equipment: None  Intra-op Plan:   Post-operative Plan: Extubation in OR  Informed Consent: I have reviewed the patients History and Physical, chart, labs and discussed the procedure including the risks, benefits and alternatives for the proposed anesthesia with the patient or authorized representative who has indicated his/her understanding and acceptance.     Dental advisory given  Plan Discussed with: CRNA  Anesthesia Plan Comments:         Anesthesia Quick Evaluation

## 2020-11-14 ENCOUNTER — Inpatient Hospital Stay (HOSPITAL_COMMUNITY)
Admission: RE | Admit: 2020-11-14 | Discharge: 2020-11-15 | DRG: 621 | Disposition: A | Discharge status: Home / Self Care | Payer: BC Managed Care – PPO | Attending: Surgery | Admitting: Surgery

## 2020-11-14 ENCOUNTER — Encounter (HOSPITAL_COMMUNITY): Payer: Self-pay | Admitting: Surgery

## 2020-11-14 ENCOUNTER — Inpatient Hospital Stay (HOSPITAL_COMMUNITY): Payer: BC Managed Care – PPO | Admitting: Anesthesiology

## 2020-11-14 ENCOUNTER — Other Ambulatory Visit: Payer: Self-pay

## 2020-11-14 ENCOUNTER — Encounter (HOSPITAL_COMMUNITY)
Admission: RE | Disposition: A | Discharge status: Home / Self Care | Payer: Self-pay | Source: Home / Self Care | Attending: Surgery

## 2020-11-14 DIAGNOSIS — Z8249 Family history of ischemic heart disease and other diseases of the circulatory system: Secondary | ICD-10-CM

## 2020-11-14 DIAGNOSIS — G932 Benign intracranial hypertension: Secondary | ICD-10-CM | POA: Diagnosis not present

## 2020-11-14 DIAGNOSIS — M25559 Pain in unspecified hip: Secondary | ICD-10-CM | POA: Diagnosis present

## 2020-11-14 DIAGNOSIS — Z87442 Personal history of urinary calculi: Secondary | ICD-10-CM

## 2020-11-14 DIAGNOSIS — M21372 Foot drop, left foot: Secondary | ICD-10-CM | POA: Diagnosis not present

## 2020-11-14 DIAGNOSIS — H547 Unspecified visual loss: Secondary | ICD-10-CM | POA: Diagnosis not present

## 2020-11-14 DIAGNOSIS — M545 Low back pain, unspecified: Secondary | ICD-10-CM | POA: Diagnosis not present

## 2020-11-14 DIAGNOSIS — F419 Anxiety disorder, unspecified: Secondary | ICD-10-CM | POA: Diagnosis present

## 2020-11-14 DIAGNOSIS — M25569 Pain in unspecified knee: Secondary | ICD-10-CM | POA: Diagnosis present

## 2020-11-14 DIAGNOSIS — Z880 Allergy status to penicillin: Secondary | ICD-10-CM

## 2020-11-14 DIAGNOSIS — F32A Depression, unspecified: Secondary | ICD-10-CM | POA: Diagnosis present

## 2020-11-14 DIAGNOSIS — Z20822 Contact with and (suspected) exposure to covid-19: Secondary | ICD-10-CM | POA: Diagnosis not present

## 2020-11-14 DIAGNOSIS — Z818 Family history of other mental and behavioral disorders: Secondary | ICD-10-CM

## 2020-11-14 DIAGNOSIS — Z87891 Personal history of nicotine dependence: Secondary | ICD-10-CM | POA: Diagnosis not present

## 2020-11-14 DIAGNOSIS — Z883 Allergy status to other anti-infective agents status: Secondary | ICD-10-CM | POA: Diagnosis not present

## 2020-11-14 DIAGNOSIS — G8929 Other chronic pain: Secondary | ICD-10-CM | POA: Diagnosis present

## 2020-11-14 DIAGNOSIS — Z6841 Body Mass Index (BMI) 40.0 and over, adult: Secondary | ICD-10-CM | POA: Diagnosis not present

## 2020-11-14 DIAGNOSIS — I10 Essential (primary) hypertension: Secondary | ICD-10-CM | POA: Diagnosis not present

## 2020-11-14 DIAGNOSIS — Z91018 Allergy to other foods: Secondary | ICD-10-CM

## 2020-11-14 DIAGNOSIS — Z79899 Other long term (current) drug therapy: Secondary | ICD-10-CM

## 2020-11-14 DIAGNOSIS — K219 Gastro-esophageal reflux disease without esophagitis: Secondary | ICD-10-CM | POA: Diagnosis present

## 2020-11-14 HISTORY — PX: UPPER GI ENDOSCOPY: SHX6162

## 2020-11-14 HISTORY — PX: LAPAROSCOPIC GASTRIC SLEEVE RESECTION: SHX5895

## 2020-11-14 LAB — SARS CORONAVIRUS 2 BY RT PCR (HOSPITAL ORDER, PERFORMED IN ~~LOC~~ HOSPITAL LAB): SARS Coronavirus 2: NEGATIVE

## 2020-11-14 SURGERY — GASTRECTOMY, SLEEVE, LAPAROSCOPIC
Anesthesia: General | Site: Abdomen

## 2020-11-14 MED ORDER — APREPITANT 40 MG PO CAPS
40.0000 mg | ORAL_CAPSULE | ORAL | Status: AC
  Administered 2020-11-14: 40 mg via ORAL
  Filled 2020-11-14: qty 1

## 2020-11-14 MED ORDER — ROCURONIUM BROMIDE 10 MG/ML (PF) SYRINGE
PREFILLED_SYRINGE | INTRAVENOUS | Status: DC | PRN
  Administered 2020-11-14: 10 mg via INTRAVENOUS
  Administered 2020-11-14: 60 mg via INTRAVENOUS

## 2020-11-14 MED ORDER — SCOPOLAMINE 1 MG/3DAYS TD PT72
1.0000 | MEDICATED_PATCH | TRANSDERMAL | Status: DC
  Administered 2020-11-14: 1.5 mg via TRANSDERMAL
  Filled 2020-11-14: qty 1

## 2020-11-14 MED ORDER — ROCURONIUM BROMIDE 10 MG/ML (PF) SYRINGE
PREFILLED_SYRINGE | INTRAVENOUS | Status: AC
  Filled 2020-11-14: qty 20

## 2020-11-14 MED ORDER — SERTRALINE HCL 50 MG PO TABS: 100.0000 mg | ORAL_TABLET | Freq: Every day | ORAL | Status: DC

## 2020-11-14 MED ORDER — HEPARIN SODIUM (PORCINE) 5000 UNIT/ML IJ SOLN
5000.0000 [IU] | INTRAMUSCULAR | Status: AC
  Administered 2020-11-14: 5000 [IU] via SUBCUTANEOUS
  Filled 2020-11-14: qty 1

## 2020-11-14 MED ORDER — PROPOFOL 10 MG/ML IV BOLUS
INTRAVENOUS | Status: DC | PRN
  Administered 2020-11-14: 200 mg via INTRAVENOUS

## 2020-11-14 MED ORDER — SIMETHICONE 80 MG PO CHEW: 80.0000 mg | CHEWABLE_TABLET | Freq: Four times a day (QID) | ORAL | Status: DC | PRN

## 2020-11-14 MED ORDER — METHOCARBAMOL 1000 MG/10ML IJ SOLN
500.0000 mg | Freq: Four times a day (QID) | INTRAVENOUS | Status: DC | PRN
  Filled 2020-11-14: qty 5

## 2020-11-14 MED ORDER — BUPIVACAINE-EPINEPHRINE (PF) 0.25% -1:200000 IJ SOLN
INTRAMUSCULAR | Status: AC
  Filled 2020-11-14: qty 30

## 2020-11-14 MED ORDER — FENTANYL CITRATE (PF) 100 MCG/2ML IJ SOLN
INTRAMUSCULAR | Status: AC
  Filled 2020-11-14: qty 2

## 2020-11-14 MED ORDER — LACTATED RINGERS IV SOLN: INTRAVENOUS | Status: DC

## 2020-11-14 MED ORDER — ORAL CARE MOUTH RINSE: 15.0000 mL | Freq: Once | OROMUCOSAL | Status: AC

## 2020-11-14 MED ORDER — ONDANSETRON HCL 4 MG/2ML IJ SOLN: 4.0000 mg | INTRAMUSCULAR | Status: DC | PRN

## 2020-11-14 MED ORDER — SUCCINYLCHOLINE CHLORIDE 200 MG/10ML IV SOSY
PREFILLED_SYRINGE | INTRAVENOUS | Status: AC
  Filled 2020-11-14: qty 10

## 2020-11-14 MED ORDER — METOPROLOL TARTRATE 5 MG/5ML IV SOLN: 5.0000 mg | Freq: Four times a day (QID) | INTRAVENOUS | Status: DC | PRN

## 2020-11-14 MED ORDER — CHLORHEXIDINE GLUCONATE 4 % EX LIQD: 60.0000 mL | Freq: Once | CUTANEOUS | Status: DC

## 2020-11-14 MED ORDER — DEXAMETHASONE SODIUM PHOSPHATE 10 MG/ML IJ SOLN
INTRAMUSCULAR | Status: DC | PRN
  Administered 2020-11-14: 10 mg via INTRAVENOUS

## 2020-11-14 MED ORDER — ACETAMINOPHEN 500 MG PO TABS
1000.0000 mg | ORAL_TABLET | ORAL | Status: AC
  Administered 2020-11-14: 1000 mg via ORAL
  Filled 2020-11-14: qty 2

## 2020-11-14 MED ORDER — MIDAZOLAM HCL 2 MG/2ML IJ SOLN
INTRAMUSCULAR | Status: AC
  Filled 2020-11-14: qty 2

## 2020-11-14 MED ORDER — HYDRALAZINE HCL 20 MG/ML IJ SOLN: 10.0000 mg | INTRAMUSCULAR | Status: DC | PRN

## 2020-11-14 MED ORDER — LIDOCAINE HCL (PF) 2 % IJ SOLN
INTRAMUSCULAR | Status: AC
  Filled 2020-11-14: qty 10

## 2020-11-14 MED ORDER — PROPOFOL 10 MG/ML IV BOLUS
INTRAVENOUS | Status: AC
  Filled 2020-11-14: qty 40

## 2020-11-14 MED ORDER — DOCUSATE SODIUM 100 MG PO CAPS
100.0000 mg | ORAL_CAPSULE | Freq: Two times a day (BID) | ORAL | Status: DC
  Filled 2020-11-14: qty 1

## 2020-11-14 MED ORDER — SUGAMMADEX SODIUM 500 MG/5ML IV SOLN
INTRAVENOUS | Status: DC | PRN
  Administered 2020-11-14: 300 mg via INTRAVENOUS

## 2020-11-14 MED ORDER — SODIUM CHLORIDE 0.9 % IV SOLN: INTRAVENOUS | Status: DC

## 2020-11-14 MED ORDER — ACETAMINOPHEN 160 MG/5ML PO SOLN
1000.0000 mg | Freq: Three times a day (TID) | ORAL | Status: DC
  Administered 2020-11-14: 14:00:00 1000 mg via ORAL
  Filled 2020-11-14: qty 40.6

## 2020-11-14 MED ORDER — BUPIVACAINE-EPINEPHRINE 0.25% -1:200000 IJ SOLN
INTRAMUSCULAR | Status: DC | PRN
  Administered 2020-11-14: 30 mL

## 2020-11-14 MED ORDER — SUCCINYLCHOLINE CHLORIDE 200 MG/10ML IV SOSY
PREFILLED_SYRINGE | INTRAVENOUS | Status: DC | PRN
  Administered 2020-11-14: 120 mg via INTRAVENOUS

## 2020-11-14 MED ORDER — ENSURE MAX PROTEIN PO LIQD: 2.0000 [oz_av] | ORAL | Status: DC

## 2020-11-14 MED ORDER — ENOXAPARIN SODIUM 30 MG/0.3ML ~~LOC~~ SOLN
30.0000 mg | Freq: Two times a day (BID) | SUBCUTANEOUS | Status: DC
  Administered 2020-11-14 – 2020-11-15 (×2): 30 mg via SUBCUTANEOUS
  Filled 2020-11-14 (×2): qty 0.3

## 2020-11-14 MED ORDER — METOCLOPRAMIDE HCL 5 MG/ML IJ SOLN
10.0000 mg | Freq: Four times a day (QID) | INTRAMUSCULAR | Status: DC
  Administered 2020-11-14 – 2020-11-15 (×4): 10 mg via INTRAVENOUS
  Filled 2020-11-14 (×4): qty 2

## 2020-11-14 MED ORDER — MIDAZOLAM HCL 5 MG/5ML IJ SOLN
INTRAMUSCULAR | Status: DC | PRN
  Administered 2020-11-14: 2 mg via INTRAVENOUS

## 2020-11-14 MED ORDER — PANTOPRAZOLE SODIUM 40 MG IV SOLR
40.0000 mg | Freq: Every day | INTRAVENOUS | Status: DC
  Administered 2020-11-14: 40 mg via INTRAVENOUS
  Filled 2020-11-14: qty 40

## 2020-11-14 MED ORDER — STERILE WATER FOR IRRIGATION IR SOLN
Status: DC | PRN
  Administered 2020-11-14: 1000 mL

## 2020-11-14 MED ORDER — LIDOCAINE 2% (20 MG/ML) 5 ML SYRINGE
INTRAMUSCULAR | Status: DC | PRN
  Administered 2020-11-14: 40 mg via INTRAVENOUS
  Administered 2020-11-14: 60 mg via INTRAVENOUS

## 2020-11-14 MED ORDER — SODIUM CHLORIDE 0.9 % IV SOLN
2.0000 g | INTRAVENOUS | Status: DC
  Filled 2020-11-14: qty 2

## 2020-11-14 MED ORDER — SODIUM CHLORIDE (PF) 0.9 % IJ SOLN
INTRAMUSCULAR | Status: AC
  Filled 2020-11-14: qty 10

## 2020-11-14 MED ORDER — ONDANSETRON HCL 4 MG/2ML IJ SOLN
INTRAMUSCULAR | Status: DC | PRN
  Administered 2020-11-14: 4 mg via INTRAVENOUS

## 2020-11-14 MED ORDER — BUPIVACAINE LIPOSOME 1.3 % IJ SUSP
INTRAMUSCULAR | Status: DC | PRN
  Administered 2020-11-14: 20 mL

## 2020-11-14 MED ORDER — EPHEDRINE SULFATE-NACL 50-0.9 MG/10ML-% IV SOSY
PREFILLED_SYRINGE | INTRAVENOUS | Status: DC | PRN
  Administered 2020-11-14 (×2): 5 mg via INTRAVENOUS
  Administered 2020-11-14: 10 mg via INTRAVENOUS
  Administered 2020-11-14: 5 mg via INTRAVENOUS

## 2020-11-14 MED ORDER — SUGAMMADEX SODIUM 500 MG/5ML IV SOLN
INTRAVENOUS | Status: AC
  Filled 2020-11-14: qty 5

## 2020-11-14 MED ORDER — AMISULPRIDE (ANTIEMETIC) 5 MG/2ML IV SOLN: 10.0000 mg | Freq: Once | INTRAVENOUS | Status: DC | PRN

## 2020-11-14 MED ORDER — OXYCODONE HCL 5 MG/5ML PO SOLN: 5.0000 mg | Freq: Four times a day (QID) | ORAL | Status: DC | PRN

## 2020-11-14 MED ORDER — KETAMINE HCL 10 MG/ML IJ SOLN
INTRAMUSCULAR | Status: AC
  Filled 2020-11-14: qty 1

## 2020-11-14 MED ORDER — EPHEDRINE 5 MG/ML INJ
INTRAVENOUS | Status: AC
  Filled 2020-11-14: qty 10

## 2020-11-14 MED ORDER — GABAPENTIN 300 MG PO CAPS
300.0000 mg | ORAL_CAPSULE | ORAL | Status: AC
  Administered 2020-11-14: 300 mg via ORAL
  Filled 2020-11-14: qty 1

## 2020-11-14 MED ORDER — LACTATED RINGERS IR SOLN
Status: DC | PRN
  Administered 2020-11-14: 1000 mL

## 2020-11-14 MED ORDER — KETOROLAC TROMETHAMINE 15 MG/ML IJ SOLN: 15.0000 mg | Freq: Three times a day (TID) | INTRAMUSCULAR | Status: DC | PRN

## 2020-11-14 MED ORDER — LIDOCAINE HCL 2 % IJ SOLN
INTRAMUSCULAR | Status: AC
  Filled 2020-11-14: qty 20

## 2020-11-14 MED ORDER — TRAMADOL HCL 50 MG PO TABS: 50.0000 mg | ORAL_TABLET | Freq: Four times a day (QID) | ORAL | Status: DC | PRN

## 2020-11-14 MED ORDER — CHLORHEXIDINE GLUCONATE 0.12 % MT SOLN
15.0000 mL | Freq: Once | OROMUCOSAL | Status: AC
  Administered 2020-11-14: 15 mL via OROMUCOSAL

## 2020-11-14 MED ORDER — HYDROMORPHONE HCL 1 MG/ML IJ SOLN: 0.5000 mg | INTRAMUSCULAR | Status: DC | PRN

## 2020-11-14 MED ORDER — ACETAMINOPHEN 500 MG PO TABS
1000.0000 mg | ORAL_TABLET | Freq: Three times a day (TID) | ORAL | Status: DC
  Administered 2020-11-14: 22:00:00 1000 mg via ORAL
  Filled 2020-11-14 (×2): qty 2

## 2020-11-14 MED ORDER — KETAMINE HCL 10 MG/ML IJ SOLN
INTRAMUSCULAR | Status: DC | PRN
  Administered 2020-11-14: 10 mg via INTRAVENOUS
  Administered 2020-11-14: 30 mg via INTRAVENOUS

## 2020-11-14 MED ORDER — FENTANYL CITRATE (PF) 100 MCG/2ML IJ SOLN
INTRAMUSCULAR | Status: DC | PRN
  Administered 2020-11-14: 100 ug via INTRAVENOUS

## 2020-11-14 MED ORDER — 0.9 % SODIUM CHLORIDE (POUR BTL) OPTIME
TOPICAL | Status: DC | PRN
  Administered 2020-11-14: 1000 mL

## 2020-11-14 MED ORDER — FENTANYL CITRATE (PF) 100 MCG/2ML IJ SOLN: 25.0000 ug | INTRAMUSCULAR | Status: DC | PRN

## 2020-11-14 SURGICAL SUPPLY — 67 items
APPLIER CLIP ROT 10 11.4 M/L (STAPLE)
APPLIER CLIP ROT 13.4 12 LRG (CLIP)
BAG LAPAROSCOPIC 12 15 PORT 16 (BASKET) IMPLANT
BAG RETRIEVAL 12/15 (BASKET)
BAG RETRIEVAL 12/15MM (BASKET)
BENZOIN TINCTURE PRP APPL 2/3 (GAUZE/BANDAGES/DRESSINGS) ×4 IMPLANT
BLADE SURG SZ11 CARB STEEL (BLADE) ×4 IMPLANT
BNDG ADH 1X3 SHEER STRL LF (GAUZE/BANDAGES/DRESSINGS) ×24 IMPLANT
CABLE HIGH FREQUENCY MONO STRZ (ELECTRODE) IMPLANT
CHLORAPREP W/TINT 26 (MISCELLANEOUS) ×8 IMPLANT
CLIP APPLIE ROT 10 11.4 M/L (STAPLE) IMPLANT
CLIP APPLIE ROT 13.4 12 LRG (CLIP) IMPLANT
CLOSURE WOUND 1/2 X4 (GAUZE/BANDAGES/DRESSINGS) ×1
COVER SURGICAL LIGHT HANDLE (MISCELLANEOUS) ×4 IMPLANT
COVER WAND RF STERILE (DRAPES) IMPLANT
DECANTER SPIKE VIAL GLASS SM (MISCELLANEOUS) ×4 IMPLANT
DEVICE SUT QUICK LOAD TK 5 (STAPLE) IMPLANT
DEVICE SUT TI-KNOT TK 5X26 (MISCELLANEOUS) IMPLANT
DEVICE TI KNOT TK5 (MISCELLANEOUS)
DRAPE UTILITY XL STRL (DRAPES) ×8 IMPLANT
ELECT REM PT RETURN 15FT ADLT (MISCELLANEOUS) ×4 IMPLANT
GAUZE SPONGE 4X4 12PLY STRL (GAUZE/BANDAGES/DRESSINGS) IMPLANT
GLOVE BIO SURGEON STRL SZ 6 (GLOVE) ×4 IMPLANT
GLOVE INDICATOR 6.5 STRL GRN (GLOVE) ×4 IMPLANT
GOWN STRL REUS W/TWL LRG LVL3 (GOWN DISPOSABLE) ×4 IMPLANT
GOWN STRL REUS W/TWL XL LVL3 (GOWN DISPOSABLE) ×16 IMPLANT
GRASPER SUT TROCAR 14GX15 (MISCELLANEOUS) ×4 IMPLANT
KIT BASIN OR (CUSTOM PROCEDURE TRAY) ×4 IMPLANT
KIT TURNOVER KIT A (KITS) ×4 IMPLANT
MARKER SKIN DUAL TIP RULER LAB (MISCELLANEOUS) ×4 IMPLANT
MAT PREVALON FULL STRYKER (MISCELLANEOUS) ×4 IMPLANT
NEEDLE SPNL 22GX3.5 QUINCKE BK (NEEDLE) ×4 IMPLANT
PACK UNIVERSAL I (CUSTOM PROCEDURE TRAY) ×4 IMPLANT
QUICK LOAD TK 5 (STAPLE)
RELOAD ENDO STITCH (ENDOMECHANICALS) IMPLANT
RELOAD STAPLER BLUE 60MM (STAPLE) ×6 IMPLANT
RELOAD STAPLER GOLD 60MM (STAPLE) ×2 IMPLANT
RELOAD STAPLER GREEN 60MM (STAPLE) ×2 IMPLANT
SCISSORS LAP 5X45 EPIX DISP (ENDOMECHANICALS) IMPLANT
SET IRRIG TUBING LAPAROSCOPIC (IRRIGATION / IRRIGATOR) ×4 IMPLANT
SET TUBE SMOKE EVAC HIGH FLOW (TUBING) ×4 IMPLANT
SHEARS HARMONIC ACE PLUS 45CM (MISCELLANEOUS) ×4 IMPLANT
SLEEVE ADV FIXATION 5X100MM (TROCAR) ×8 IMPLANT
SLEEVE GASTRECTOMY 40FR VISIGI (MISCELLANEOUS) ×4 IMPLANT
SOL ANTI FOG 6CC (MISCELLANEOUS) ×2 IMPLANT
SOLUTION ANTI FOG 6CC (MISCELLANEOUS) ×2
SPONGE LAP 18X18 RF (DISPOSABLE) ×4 IMPLANT
STAPLER ECHELON BIOABSB 60 FLE (MISCELLANEOUS) IMPLANT
STAPLER ECHELON LONG 60 440 (INSTRUMENTS) ×4 IMPLANT
STAPLER RELOAD BLUE 60MM (STAPLE) ×12
STAPLER RELOAD GOLD 60MM (STAPLE) ×4
STAPLER RELOAD GREEN 60MM (STAPLE) ×4
STRIP CLOSURE SKIN 1/2X4 (GAUZE/BANDAGES/DRESSINGS) ×3 IMPLANT
SUT MNCRL AB 4-0 PS2 18 (SUTURE) ×4 IMPLANT
SUT SURGIDAC NAB ES-9 0 48 120 (SUTURE) IMPLANT
SUT VICRYL 0 TIES 12 18 (SUTURE) ×4 IMPLANT
SYR 10ML ECCENTRIC (SYRINGE) ×4 IMPLANT
SYR 20ML LL LF (SYRINGE) ×4 IMPLANT
SYR 50ML LL SCALE MARK (SYRINGE) ×4 IMPLANT
TOWEL OR 17X26 10 PK STRL BLUE (TOWEL DISPOSABLE) ×4 IMPLANT
TOWEL OR NON WOVEN STRL DISP B (DISPOSABLE) ×4 IMPLANT
TROCAR ADV FIXATION 5X100MM (TROCAR) ×4 IMPLANT
TROCAR BLADELESS 15MM (ENDOMECHANICALS) ×4 IMPLANT
TROCAR BLADELESS OPT 5 100 (ENDOMECHANICALS) ×4 IMPLANT
TUBING CONNECTING 10 (TUBING) ×3 IMPLANT
TUBING CONNECTING 10' (TUBING) ×1
TUBING ENDO SMARTCAP (MISCELLANEOUS) ×4 IMPLANT

## 2020-11-14 NOTE — Op Note (Signed)
Debbie Townsend [id number] Jan 09, 1986 11/14/2020  Preoperative diagnosis: severe obesity  Postoperative diagnosis: Same   Procedure: upper endoscopy   Surgeon: Leighton Ruff. Lazaro Isenhower M.D., FACS   Anesthesia: Gen.   Indications for procedure: 34 y.o. year old female undergoing Laparoscopic Gastric Sleeve Resection and an EGD was requested to evaluate the new gastric sleeve.   Description of procedure: After we have completed the sleeve resection, I scrubbed out and obtained the Olympus endoscope. I gently placed endoscope in the patient's oropharynx and gently glided it down the esophagus without any difficulty under direct visualization. Once I was in the gastric sleeve, I insufflated the stomach with air. I was able to cannulate and advanced the scope through the gastric sleeve. I was able to cannulate the duodenum with ease. Dr. Kae Heller had placed saline in the upper abdomen. Upon further insufflation of the gastric sleeve there was no evidence of bubbles. GE junction located at 37 cm. Z line regular. Upon further inspection of the gastric sleeve, the mucosa appeared normal. There is no evidence of any mucosal abnormality. The sleeve was widely patent at the angularis. There was no evidence of bleeding. The gastric sleeve was decompressed. The scope was withdrawn. The patient tolerated this portion of the procedure well. Please see Dr Ron Parker operative note for details regarding the laparoscopic gastric sleeve resection.   Leighton Ruff. Redmond Pulling, MD, FACS  General, Bariatric, & Minimally Invasive Surgery  [facility], [place]

## 2020-11-14 NOTE — Transfer of Care (Signed)
Immediate Anesthesia Transfer of Care Note  Patient: Debbie Townsend  Procedure(s) Performed: Procedure(s): LAPAROSCOPIC GASTRIC SLEEVE RESECTION (N/A) UPPER GI ENDOSCOPY (N/A)  Patient Location: PACU  Anesthesia Type:General  Level of Consciousness:  sedated, patient cooperative and responds to stimulation  Airway & Oxygen Therapy:Patient Spontanous Breathing and Patient connected to face mask oxgen  Post-op Assessment:  Report given to PACU RN and Post -op Vital signs reviewed and stable  Post vital signs:  Reviewed and stable  Last Vitals:  Vitals:   11/14/20 0540 11/14/20 0851  BP: (!) 135/93 (!) 145/83  Pulse: 80 73  Resp: 16 16  Temp: 37.1 C (!) 36.3 C  SpO2: 46% 803%    Complications: No apparent anesthesia complications

## 2020-11-14 NOTE — Op Note (Signed)
Operative Note  Debbie Townsend  [id number]  [id number]  11/14/2020   Surgeon: [name] MD   Assistant: Greer Pickerel MD   Procedure performed: laparoscopic sleeve gastrectomy, upper endoscopy   Preop diagnosis: Morbid obesity Body mass index is 45.19 kg/m. Post-op diagnosis/intraop findings: same   Specimens: fundus Retained items: none  EBL: minimal  Complications: none   Description of procedure: After obtaining informed consent and administration of chemical DVT prophylaxis in holding, the patient was taken to the operating room and placed supine on operating room table where general endotracheal anesthesia was initiated, preoperative antibiotics were administered, SCDs applied, and a formal timeout was performed. The abdomen was prepped and draped in usual sterile fashion. Peritoneal access was gained using a Visiport technique in the left upper quadrant and insufflation to 15 mmHg ensued without issue. Gross inspection revealed no evidence of injury. Under direct visualization three more 5 mm trochars were placed in the right and left hemiabdomen and the 70mm trocar in the right paramedian upper abdomen. Bilateral laparoscopic assisted TAPS blocks were performed with Exparel diluted with 0.25 percent Marcaine. The patient was placed in steep Trendelenburg and the liver retractor was introduced through an incision in the upper midline and secured to the post externally to maintain the left lobe retracted anteriorly.  There was no hiatal hernia on direct inspection. Using the Harmonic scalpel, the greater curvature of the stomach was dissected away from the greater omentum and short gastric vessels were divided. This began 6 cm from the pylorus, and dissection proceeded until the left crus was clearly exposed. The 34 [place] VisiGi was then introduced and directed down towards the pylorus. This was placed to suction against the lesser curve. Serial fires of the linear cutting stapler  were then employed to create our sleeve. The first fire used a green load and ensured adequate room at the angularis incisura. One gold load and then several blue loads were then employed to create an evenly tubular stomach up to the angle of His. The excised stomach was then removed through our 15 mm trocar site.  The visigi was taken off of suction and a few puffs of air were introduced, inflating the sleeve. No bubbles were observed in the irrigation fluid around the stomach and the shape was noted to be evenly tubular without any narrowing at the angularis. The visigi was then removed. Upper endoscopy was performed by the assistant surgeon and the sleeve was noted to be airtight, the staple line was hemostatic. Please see his separate note. The endoscope was removed. A small amount of oozing on the distal staple line was addressed with clips. The 15 mm trocar site fascia in the right upper abdomen was closed with a 0 Vicryl using the laparoscopic suture passer under direct visualization. The liver retractor was removed under direct visualization. The abdomen was then desufflated and all remaining trochars removed. The skin incisions were closed with subcuticular Monocryl; benzoin, Steri-Strips and Band-Aids were applied The patient was then awakened, extubated and taken to PACU in stable condition.     All counts were correct at the completion of the case.

## 2020-11-14 NOTE — Progress Notes (Signed)
Discussed post op day goals with patient including ambulation, IS, diet progression, pain, and nausea control.  BSTOP education provided including BSTOP information guide, "Guide for Pain Management after your Bariatric Procedure".  Questions answered. 

## 2020-11-14 NOTE — Interval H&P Note (Signed)
History and Physical Interval Note:  11/14/2020 7:02 AM  Debbie Townsend  has presented today for surgery, with the diagnosis of MORBID OBESITY.  The various methods of treatment have been discussed with the patient and family. After consideration of risks, benefits and other options for treatment, the patient has consented to  Procedure(s): LAPAROSCOPIC GASTRIC SLEEVE RESECTION (N/A) UPPER GI ENDOSCOPY (N/A) as a surgical intervention.  The patient's history has been reviewed, patient examined, no change in status, stable for surgery.  I have reviewed the patient's chart and labs.  Questions were answered to the patient's satisfaction.     Abbrielle Batts Rich Brave

## 2020-11-14 NOTE — Anesthesia Postprocedure Evaluation (Signed)
Anesthesia Post Note  Patient: Debbie Townsend  Procedure(s) Performed: LAPAROSCOPIC GASTRIC SLEEVE RESECTION (N/A Abdomen) UPPER GI ENDOSCOPY (N/A )     Patient location during evaluation: PACU Anesthesia Type: General Level of consciousness: awake and alert Pain management: pain level controlled Vital Signs Assessment: post-procedure vital signs reviewed and stable Respiratory status: spontaneous breathing, nonlabored ventilation, respiratory function stable and patient connected to nasal cannula oxygen Cardiovascular status: blood pressure returned to baseline and stable Postop Assessment: no apparent nausea or vomiting Anesthetic complications: no   No complications documented.  Last Vitals:  Vitals:   11/14/20 1157 11/14/20 1314  BP: (!) 159/100 (!) 145/84  Pulse: 61 63  Resp: 18 17  Temp: 36.6 C 36.8 C  SpO2: 99% 100%    Last Pain:  Vitals:   11/14/20 1157  TempSrc: Oral  PainSc:                  Tiajuana Amass

## 2020-11-14 NOTE — Discharge Instructions (Signed)
GASTRIC BYPASS/SLEEVE  Home Care Instructions   These instructions are to help you care for yourself when you go home.  Call: If you have any problems. . Call [phone] and ask for the surgeon on call . If you need immediate help, come to the ER at [facility]  . Tell the ER staff that you are a new post-op gastric bypass or gastric sleeve patient   Signs and symptoms to report: . Severe vomiting or nausea o If you cannot keep down clear liquids for longer than 1 day, call your surgeon  . Abdominal pain that does not get better after taking your pain medication . Fever over 100.4 F with chills . Heart beating over 100 beats a minute . Shortness of breath at rest . Chest pain .  Redness, swelling, drainage, or foul odor at incision (surgical) sites .  If your incisions open or pull apart . Swelling or pain in calf (lower leg) . Diarrhea (Loose bowel movements that happen often), frequent watery, uncontrolled bowel movements . Constipation, (no bowel movements for 3 days) if this happens: Pick one o Milk of Magnesia, 2 tablespoons by mouth, 3 times a day for 2 days if needed o Stop taking Milk of Magnesia once you have a bowel movement o Call your doctor if constipation continues Or o Miralax  (instead of Milk of Magnesia) following the label instructions o Stop taking Miralax once you have a bowel movement o Call your doctor if constipation continues . Anything you think is not normal   Normal side effects after surgery: . Unable to sleep at night or unable to focus . Irritability or moody . Being tearful (crying) or depressed These are common complaints, possibly related to your anesthesia medications that put you to sleep, stress of surgery, and change in lifestyle.  This usually goes away a few weeks after surgery.  If these feelings continue, call your primary care doctor.   Wound Care: You may have surgical glue, steri-strips, or staples over your incisions after  surgery . Surgical glue:  Looks like a clear film over your incisions and will wear off a little at a time . Steri-strips: Strips of tape over your incisions. You may notice a yellowish color on the skin under the steri-strips. This is used to make the   steri-strips stick better. Do not pull the steri-strips off - let them fall off . Staples: Jodell Cipro may be removed before you leave the hospital o If you go home with staples, call [facility], [phone]) [phone] at for an appointment with your surgeon's nurse to have staples removed 10 days after surgery. . Showering: You may shower two (2) days after your surgery unless your surgeon tells you differently o Wash gently around incisions with warm soapy water, rinse well, and gently pat dry  o No tub baths until staples are removed, steri-strips fall off or glue is gone.    Medications: Marland Kitchen Medications should be liquid or crushed if larger than the size of a dime . Extended release pills (medication that release a little bit at a time through the day) should NOT be crushed or cut. (examples include XL, ER, DR, SR) . Depending on the size and number of medications you take, you may need to space (take a few throughout the day)/change the time you take your medications so that you do not over-fill your pouch (smaller stomach) . Make sure you follow-up with your primary care doctor to  make medication changes needed during rapid weight loss and life-style changes . If you have diabetes, follow up with the doctor that orders your diabetes medication(s) within one week after surgery and check your blood sugar regularly. . Do not drive while taking prescription pain medication  . It is ok to take Tylenol by the bottle instructions with your pain medicine or instead of your pain medicine as needed.  DO NOT TAKE NSAIDS (EXAMPLES OF NSAIDS:  IBUPROFREN/ NAPROXEN)  Diet:                    First 2 Weeks  You will see the dietician t about two (2) weeks  after your surgery. The dietician will increase the types of foods you can eat if you are handling liquids well: Marland Kitchen If you have severe vomiting or nausea and cannot keep down clear liquids lasting longer than 1 day, call your surgeon @ [phone]) Protein Shake . Drink at least 2 ounces of shake 5-6 times per day . Each serving of protein shakes (usually 8 - 12 ounces) should have: o 15 grams of protein  o And no more than 5 grams of carbohydrate  . Goal for protein each day: o Men = 80 grams per day o Women = 60 grams per day . Protein powder may be added to fluids such as non-fat milk or Lactaid milk or unsweetened Soy/Almond milk (limit to 35 grams added protein powder per serving)  Hydration . Slowly increase the amount of water and other clear liquids as tolerated (See Acceptable Fluids) . Slowly increase the amount of protein shake as tolerated  .  Sip fluids slowly and throughout the day.  Do not use straws. . May use sugar substitutes in small amounts (no more than 6 - 8 packets per day; i.e. Splenda)  Fluid Goal . The first goal is to drink at least 8 ounces of protein shake/drink per day (or as directed by the nutritionist); some examples of protein shakes are Johnson [employer] Johnson, AMR Corporation, EAS Edge HP, and Unjury. See handout from pre-op Bariatric Education Class: o Slowly increase the amount of protein shake you drink as tolerated o You may find it easier to slowly sip shakes throughout the day o It is important to get your proteins in first . Your fluid goal is to drink 64 - 100 ounces of fluid daily o It may take a few weeks to build up to this . 32 oz (or more) should be clear liquids  And  . 32 oz (or more) should be full liquids (see below for examples) . Liquids should not contain sugar, caffeine, or carbonation  Clear Liquids: . Water or Sugar-free flavored water (i.e. Fruit H2O, Propel) . Decaffeinated coffee or tea (sugar-free) . [employer], [employer],  Minute [facility] . Sugar-free Jell-O . Bouillon or broth . Sugar-free Popsicle:   *Less than 20 calories each; Limit 1 per day  Full Liquids: Protein Shakes/Drinks + 2 choices per day of other full liquids . Full liquids must be: o No More Than 15 grams of Carbs per serving  o No More Than 3 grams of Fat per serving . Strained low-fat cream soup (except Cream of Potato or Tomato) . Non-Fat milk . Fat-free Lactaid Milk . Unsweetened Soy Or Unsweetened Almond Milk . Low Sugar yogurt (Dannon Lite & Fit, [place] yogurt; Oikos Triple Zero; Chobani Simply 100; Yoplait 100 calorie [place] - No Fruit on the Bottom)    Vitamins  and Minerals . Start 1 day after surgery unless otherwise directed by your surgeon . Chewable Bariatric Specific Multivitamin / Multimineral Supplement with iron (Example: Bariatric Advantage Multi EA) . Chewable Calcium with Vitamin D-3 (Example: 3 Chewable Calcium Plus 600 with Vitamin D-3) o Take 500 mg three (3) times a day for a total of 1500 mg each day o Do not take all 3 doses of calcium at one time as it may cause constipation, and you can only absorb 500 mg  at a time  o Do not mix multivitamins containing iron with calcium supplements; take 2 hours apart . Menstruating women and those with a history of anemia (a blood disease that causes weakness) may need extra iron o Talk with your doctor to see if you need more iron . Do not stop taking or change any vitamins or minerals until you talk to your dietitian or surgeon . Your Dietitian and/or surgeon must approve all vitamin and mineral supplements   Activity and Exercise: Limit your physical activity as instructed by your doctor.  It is important to continue walking at home.  During this time, use these guidelines: . Do not lift anything greater than ten (10) pounds for at least two (2) weeks . Do not go back to work or drive until Engineer, production says you can . You may have sex when you feel comfortable  o It is  VERY important for female patients to use a reliable birth control method; fertility often increases after surgery  o All hormonal birth control will be ineffective for 30 days after surgery due to medications given during surgery a barrier method must be used. o Do not get pregnant for at least 18 months . Start exercising as soon as your doctor tells you that you can o Make sure your doctor approves any physical activity . Start with a simple walking program . Walk 5-15 minutes each day, 7 days per week.  . Slowly increase until you are walking 30-45 minutes per day Consider joining our [place] program. [phone] or email [email or web address]   Special Instructions Things to remember: . Use your CPAP when sleeping if this applies to you  . [facility] has two free Bariatric Surgery Support Groups that meet monthly o The 3rd Thursday of each month, 6 pm o  . It is very important to keep all follow up appointments with your surgeon, dietitian, primary care physician, and behavioral health practitioner . Routine follow up schedule with your surgeon include appointments at 2-3 weeks, 6-8 weeks, 6 months, and 1 year at a minimum.  Your surgeon may request to see you more often.   . After the first year, please follow up with your bariatric surgeon and dietitian at least once a year in order to maintain best weight loss results   [facility]: [place]: [phone] Bariatric Nurse Coordinator: [phone]      Reviewed and Endorsed  by [facility] Patient Education Committee, June, 2016 Edits Approved: Aug, 2018

## 2020-11-14 NOTE — Progress Notes (Signed)
PHARMACY CONSULT FOR:  Risk Assessment for Post-Discharge VTE Following Bariatric Surgery  Post-Discharge VTE Risk Assessment: This patient's probability of 30-day post-discharge VTE is increased due to the factors marked:   Female    Age >/=60 years    BMI >/=50 kg/m2    CHF    Dyspnea at Rest    Paraplegia   X Non-gastric-band surgery    Operation Time >/=3 hr    Return to OR     Length of Stay >/= 3 d   Hx of VTE   Hypercoagulable condition   Significant venous stasis   Predicted probability of 30-day post-discharge VTE: 0.16%  Other patient-specific factors to consider:  Recommendation for Discharge: No pharmacologic prophylaxis post-discharge  Debbie Townsend is a 34 y.o. female who underwent  Lap Sleeve resection on 11/30   Case start: 0751 Case end: 0842   Allergies  Allergen Reactions  . Apple Other (See Comments)    Lips itch and swell, inside of mouth gets hives  . Carrot [Daucus Carota] Other (See Comments)    Lips itch and swell, inside of mouth gets hives  . Monistat [Miconazole] Swelling    Severe vaginal itching and swelling  . Other Itching and Swelling    Almonds-mouth swells and itches  . Almond Oil Hives  . Amoxicillin Hives, Itching, Swelling and Dermatitis    Did it involve swelling of the face/tongue/throat, SOB, or low BP? No Did it involve sudden or severe rash/hives, skin peeling, or any reaction on the inside of your mouth or nose? Yes Did you need to seek medical attention at a hospital or doctor's office? Yes When did it last happen?within the past 5 years If all above answers are "NO", may proceed with cephalosporin use.   . Imitrex [Sumatriptan]     Felt like heart attack   . Topamax [Topiramate]     Memory loss      Patient Measurements: Height: '5\' 6"'  (167.6 cm) Weight: 127 kg (280 lb) IBW/kg (Calculated) : 59.3 Body mass index is 45.19 kg/m.  No results for input(s): WBC, HGB, HCT, PLT, APTT, CREATININE, LABCREA,  CREATININE, CREAT24HRUR, MG, PHOS, ALBUMIN, PROT, ALBUMIN, AST, ALT, ALKPHOS, BILITOT, BILIDIR, IBILI in the last 72 hours. Estimated Creatinine Clearance: 133.9 mL/min (by C-G formula based on SCr of 0.75 mg/dL).    Past Medical History:  Diagnosis Date  . Anxiety   . BRCA negative   . Depression   . Family history of anesthesia complication     mother had Post -op nausea and dizziness.  . Foot drop, left foot   . GERD (gastroesophageal reflux disease)   . Headache   . History of kidney stones   . Hypertension   . Kidney stone   . Pseudotumor cerebri 2007 or 2009     Medications Prior to Admission  Medication Sig Dispense Refill Last Dose  . acetaZOLAMIDE (DIAMOX) 500 MG capsule Take 1,000 mg by mouth in the morning.   11/13/2020 at Unknown time  . AIMOVIG 140 MG/ML SOAJ Take 140 mg by mouth every 30 (thirty) days.    Past Month at Unknown time  . famotidine (ZANTAC 360) 10 MG tablet Take 10 mg by mouth daily.   11/13/2020 at Unknown time  . lisinopril (ZESTRIL) 20 MG tablet Take 1 tablet (20 mg total) by mouth daily. 90 tablet 3 11/13/2020 at Unknown time  . OVER THE COUNTER MEDICATION Take 1 tablet by mouth at bedtime. Tranquil Sleep   11/13/2020  at Unknown time  . sertraline (ZOLOFT) 100 MG tablet Take 1 tablet (100 mg total) by mouth daily. 90 tablet 3 11/14/2020 at 0420  . levocetirizine (XYZAL) 5 MG tablet Take 5 mg by mouth every evening.    Not Taking at Unknown time  . promethazine (PHENERGAN) 12.5 MG tablet Take 1-2 tablets (12.5-25 mg total) by mouth 2 (two) times daily as needed for nausea or vomiting. 30 tablet 2   . Rimegepant Sulfate (NURTEC PO) Take 75 mg by mouth. Once daily prn abortive for migraines   More than a month at Unknown time   Minda Ditto PharmD 11/14/2020,11:41 AM

## 2020-11-14 NOTE — Anesthesia Procedure Notes (Signed)
Procedure Name: Intubation Date/Time: 11/14/2020 7:48 AM Performed by: Lavina Hamman, CRNA Pre-anesthesia Checklist: Patient identified, Emergency Drugs available, Suction available, Patient being monitored and Timeout performed Patient Re-evaluated:Patient Re-evaluated prior to induction Oxygen Delivery Method: Circle system utilized Preoxygenation: Pre-oxygenation with 100% oxygen Induction Type: IV induction Ventilation: Mask ventilation without difficulty Laryngoscope Size: Mac and 3 Grade View: Grade I Tube type: Oral Tube size: 7.5 mm Number of attempts: 1 Airway Equipment and Method: Stylet Placement Confirmation: ETT inserted through vocal cords under direct vision,  positive ETCO2,  CO2 detector and breath sounds checked- equal and bilateral Secured at: 21 cm Tube secured with: Tape Dental Injury: Teeth and Oropharynx as per pre-operative assessment  Comments: ATOI

## 2020-11-15 ENCOUNTER — Encounter (HOSPITAL_COMMUNITY): Payer: Self-pay | Admitting: Surgery

## 2020-11-15 ENCOUNTER — Other Ambulatory Visit (HOSPITAL_COMMUNITY): Payer: Self-pay | Admitting: Surgery

## 2020-11-15 LAB — COMPREHENSIVE METABOLIC PANEL
ALT: 44 U/L (ref 0–44)
AST: 20 U/L (ref 15–41)
Albumin: 3.5 g/dL (ref 3.5–5.0)
Alkaline Phosphatase: 40 U/L (ref 38–126)
Anion gap: 8 (ref 5–15)
BUN: 9 mg/dL (ref 6–20)
CO2: 23 mmol/L (ref 22–32)
Calcium: 8.3 mg/dL — ABNORMAL LOW (ref 8.9–10.3)
Chloride: 107 mmol/L (ref 98–111)
Creatinine, Ser: 0.62 mg/dL (ref 0.44–1.00)
GFR, Estimated: >60 mL/min (ref >60)
Glucose, Bld: 140 mg/dL — ABNORMAL HIGH (ref 70–99)
Potassium: 3.8 mmol/L (ref 3.5–5.1)
Sodium: 138 mmol/L (ref 135–145)
Total Bilirubin: 0.6 mg/dL (ref 0.3–1.2)
Total Protein: 6.2 g/dL — ABNORMAL LOW (ref 6.5–8.1)

## 2020-11-15 LAB — CBC WITH DIFFERENTIAL/PLATELET
Abs Immature Granulocytes: 0.09 10*3/uL — ABNORMAL HIGH (ref 0.00–0.07)
Basophils Absolute: 0 10*3/uL (ref 0.0–0.1)
Basophils Relative: 0 %
Eosinophils Absolute: 0 10*3/uL (ref 0.0–0.5)
Eosinophils Relative: 0 %
HCT: 39.9 % (ref 36.0–46.0)
Hemoglobin: 13 g/dL (ref 12.0–15.0)
Immature Granulocytes: 1 %
Lymphocytes Relative: 6 %
Lymphs Abs: 0.8 10*3/uL (ref 0.7–4.0)
MCH: 28.7 pg (ref 26.0–34.0)
MCHC: 32.6 g/dL (ref 30.0–36.0)
MCV: 88.1 fL (ref 80.0–100.0)
Monocytes Absolute: 0.7 10*3/uL (ref 0.1–1.0)
Monocytes Relative: 5 %
Neutro Abs: 12 10*3/uL — ABNORMAL HIGH (ref 1.7–7.7)
Neutrophils Relative %: 88 %
Platelets: 245 10*3/uL (ref 150–400)
RBC: 4.53 MIL/uL (ref 3.87–5.11)
RDW: 13.2 % (ref 11.5–15.5)
WBC: 13.6 10*3/uL — ABNORMAL HIGH (ref 4.0–10.5)
nRBC: 0 % (ref 0.0–0.2)

## 2020-11-15 LAB — MAGNESIUM: Magnesium: 2 mg/dL (ref 1.7–2.4)

## 2020-11-15 LAB — SURGICAL PATHOLOGY

## 2020-11-15 MED ORDER — TRAMADOL HCL 50 MG PO TABS: 50.0000 mg | ORAL_TABLET | Freq: Four times a day (QID) | ORAL | 0 refills | Status: DC | PRN

## 2020-11-15 MED ORDER — DOCUSATE SODIUM 100 MG PO CAPS: 100.0000 mg | ORAL_CAPSULE | Freq: Two times a day (BID) | ORAL | 0 refills | Status: AC

## 2020-11-15 MED ORDER — ACETAMINOPHEN 500 MG PO TABS: 1000.0000 mg | ORAL_TABLET | Freq: Three times a day (TID) | ORAL | 0 refills | Status: AC

## 2020-11-15 MED ORDER — ACETAZOLAMIDE ER 500 MG PO CP12
500.0000 mg | ORAL_CAPSULE | Freq: Every morning | ORAL | 0 refills | Status: DC
Start: 2020-11-15 — End: 2021-01-11

## 2020-11-15 MED ORDER — PANTOPRAZOLE SODIUM 40 MG PO TBEC: 40.0000 mg | DELAYED_RELEASE_TABLET | Freq: Every day | ORAL | 0 refills | Status: DC

## 2020-11-15 MED ORDER — ONDANSETRON 4 MG PO TBDP: 4.0000 mg | ORAL_TABLET | Freq: Four times a day (QID) | ORAL | 0 refills | Status: DC | PRN

## 2020-11-15 MED ORDER — GABAPENTIN 100 MG PO CAPS: 200.0000 mg | ORAL_CAPSULE | Freq: Two times a day (BID) | ORAL | 0 refills | Status: DC

## 2020-11-15 MED FILL — traMADol HCL 50 MG TABS: 50 | 2 days supply | Qty: 10 | Fill #0

## 2020-11-15 MED FILL — PANTOPRAZOLE SOD DR 40 MG T: 40 | 90 days supply | Qty: 90 | Fill #0

## 2020-11-15 MED FILL — GABAPENTIN 100 MG CAPSULE: 100 | 5 days supply | Qty: 20 | Fill #0

## 2020-11-15 MED FILL — ONDANSETRON ODT 4 MG TABLET: 4 | 15 days supply | Qty: 20 | Fill #0

## 2020-11-15 NOTE — Progress Notes (Signed)
Pt finished water requirements. Tolerating protein shake.

## 2020-11-15 NOTE — Discharge Summary (Signed)
Physician Discharge Summary  Debbie Townsend BMW:[id number] DOB: [birth year] DOA: 11/14/2020  PCP: McLean-Scocuzza, Nino Glow, MD  Admit date: 11/14/2020 Discharge date:   Recommendations for Outpatient Follow-up:    Follow-up Information    [name], MD. Go on 12/06/2020.   Specialty: General Surgery Why: at 1130 am.  Please arrive 15 minutes prior to appointment.  Thank you Contact information: [address] [place] [address] [phone]        Surgery, [place]. Go on 01/03/2021.   Specialty: General Surgery Why: at 1130 am with Dr Romana Juniper. Contact information: [place] [address] [phone]              Discharge Diagnoses:  Active Problems:   Morbid obesity ([place])   Surgical Procedure: Laparoscopic Sleeve Gastrectomy, upper endoscopy  Discharge Condition: Good Disposition: Home  Diet recommendation: Postoperative sleeve gastrectomy diet (liquids only)  Filed Weights   11/14/20 0540  Weight: 127 kg     Hospital Course:  The patient was admitted for a planned laparoscopic sleeve gastrectomy. Please see operative note. Preoperatively the patient was given 5000 units of subcutaneous heparin for DVT prophylaxis. Postoperative prophylactic Lovenox dosing was started on the evening of postoperative day 0. ERAS protocol was used. On the evening of postoperative day 0, the patient was started on water and ice chips. On postoperative day 1 the patient had no fever or tachycardia and was tolerating water in their diet was gradually advanced throughout the day. The patient was ambulating without difficulty. Their vital signs are stable without fever or tachycardia. Their hemoglobin had remained stable.  The patient had received discharge instructions and counseling. They were deemed stable for discharge and had met discharge criteria  Discharge exam Today's Vitals   11/14/20 1945 11/14/20 2157  11/15/20 0200 11/15/20 0543  BP:  111/71 (!) 116/21 122/60  Pulse:  (!) 56 73 63  Resp:  '15 16 16  ' Temp:  97.9 F (36.6 C) 98.3 F (36.8 C) 98.2 F (36.8 C)  TempSrc:   Oral Oral  SpO2:  95% 99% 100%  Weight:      Height:      PainSc: 0-No pain      Body mass index is 45.19 kg/m.   Alert and well appearing Unlabored respirations Abdomen soft, nontender. Incisions c/d/i with steri strips/bandaids. No cellulitis or hematoma No LE edema     Discharge Instructions  Discharge Instructions    Call MD for:  severe uncontrolled pain   Complete by: As directed      Allergies as of 11/15/2020      Reactions   Apple Other (See Comments)   Lips itch and swell, inside of mouth gets hives   Carrot [daucus Carota] Other (See Comments)   Lips itch and swell, inside of mouth gets hives   Monistat [miconazole] Swelling   Severe vaginal itching and swelling   Other Itching, Swelling   Almonds-mouth swells and itches   Almond Oil Hives   Amoxicillin Hives, Itching, Swelling, Dermatitis   Did it involve swelling of the face/tongue/throat, SOB, or low BP? No Did it involve sudden or severe rash/hives, skin peeling, or any reaction on the inside of your mouth or nose? Yes Did you need to seek medical attention at a hospital or doctor's office? Yes When did it last happen?within the past 5 years If all above answers are "NO", may proceed with cephalosporin use.   Imitrex [  sumatriptan]    Felt like heart attack   Topamax [topiramate]    Memory loss       Medication List    STOP taking these medications   lisinopril 20 MG tablet Commonly known as: ZESTRIL   Zantac 360 10 MG tablet Generic drug: famotidine     TAKE these medications   acetaminophen 500 MG tablet Commonly known as: TYLENOL Take 2 tablets (1,000 mg total) by mouth every 8 (eight) hours for 5 days.   acetaZOLAMIDE 500 MG capsule Commonly known as: DIAMOX Take 1 capsule (500 mg total) by mouth in the  morning. What changed: how much to take   Aimovig 140 MG/ML Soaj Generic drug: Erenumab-aooe Take 140 mg by mouth every 30 (thirty) days.   docusate sodium 100 MG capsule Commonly known as: Colace Take 1 capsule (100 mg total) by mouth 2 (two) times daily. Okay to decrease to once daily or stop taking if having loose bowel movements   gabapentin 100 MG capsule Commonly known as: NEURONTIN Take 2 capsules (200 mg total) by mouth every 12 (twelve) hours.   levocetirizine 5 MG tablet Commonly known as: XYZAL Take 5 mg by mouth every evening.   NURTEC PO Take 75 mg by mouth. Once daily prn abortive for migraines   ondansetron 4 MG disintegrating tablet Commonly known as: ZOFRAN-ODT Take 1 tablet (4 mg total) by mouth every 6 (six) hours as needed for nausea or vomiting.   OVER THE COUNTER MEDICATION Take 1 tablet by mouth at bedtime. Tranquil Sleep   pantoprazole 40 MG tablet Commonly known as: PROTONIX Take 1 tablet (40 mg total) by mouth daily.   promethazine 12.5 MG tablet Commonly known as: PHENERGAN Take 1-2 tablets (12.5-25 mg total) by mouth 2 (two) times daily as needed for nausea or vomiting.   sertraline 100 MG tablet Commonly known as: ZOLOFT Take 1 tablet (100 mg total) by mouth daily.   traMADol 50 MG tablet Commonly known as: ULTRAM Take 1 tablet (50 mg total) by mouth every 6 (six) hours as needed (pain).       Follow-up Information    [name], MD. Go on 12/06/2020.   Specialty: General Surgery Why: at 1130 am.  Please arrive 15 minutes prior to appointment.  Thank you Contact information: [address] [place] [address] [phone]        Surgery, [place]. Go on 01/03/2021.   Specialty: General Surgery Why: at 1130 am with Dr Romana Juniper. Contact information: [place] [address] [phone]                The results of significant diagnostics from this  hospitalization (including imaging, microbiology, ancillary and laboratory) are listed below for reference.     Labs: Basic Metabolic Panel: Recent Labs  Lab 11/15/20 0527  NA 138  K 3.8  CL 107  CO2 23  GLUCOSE 140*  BUN 9  CREATININE 0.62  CALCIUM 8.3*  MG 2.0   Liver Function Tests: Recent Labs  Lab 11/15/20 0527  AST 20  ALT 44  ALKPHOS 40  BILITOT 0.6  PROT 6.2*  ALBUMIN 3.5    CBC: Recent Labs  Lab 11/15/20 0527  WBC 13.6*  NEUTROABS 12.0*  HGB 13.0  HCT 39.9  MCV 88.1  PLT 245      Signed:  [facility], [place] 11/15/2020, 8:37 AM

## 2020-11-15 NOTE — Progress Notes (Signed)
Patient alert and oriented, pain is controlled. Patient is tolerating fluids, advanced to protein shake today, patient is tolerating well.  Reviewed Gastric sleeve discharge instructions with patient and patient is able to articulate understanding.  Provided information on BELT program, Support Group and [facility] outpatient pharmacy. All questions answered, will continue to monitor.  

## 2020-11-16 ENCOUNTER — Encounter: Payer: Self-pay | Admitting: Internal Medicine

## 2020-11-17 ENCOUNTER — Other Ambulatory Visit: Payer: Self-pay | Admitting: Internal Medicine

## 2020-11-17 DIAGNOSIS — B373 Candidiasis of vulva and vagina: Secondary | ICD-10-CM

## 2020-11-17 DIAGNOSIS — D72829 Elevated white blood cell count, unspecified: Secondary | ICD-10-CM

## 2020-11-17 DIAGNOSIS — B3731 Acute candidiasis of vulva and vagina: Secondary | ICD-10-CM

## 2020-11-17 MED ORDER — FLUCONAZOLE 150 MG PO TABS: 150.0000 mg | ORAL_TABLET | Freq: Once | ORAL | 0 refills | Status: AC

## 2020-11-20 ENCOUNTER — Telehealth (HOSPITAL_COMMUNITY): Payer: Self-pay

## 2020-11-20 NOTE — Telephone Encounter (Signed)
Patient called to discuss post bariatric surgery follow up questions.  See below:   1.  Tell me about your pain and pain management? Denies, unless over drinking.  2.  Let's talk about fluid intake.  How much total fluid are you taking in?54 ounces  3.  How much protein have you taken in the last 2 days?60 grams   4.  Have you had nausea?  Tell me about when have experienced nausea and what you did to help?denies  5.  Has the frequency or color changed with your urine?urinated regularly, urine a little dark gets lighter in the evening.  Not taking diuretic  6.  Tell me what your incisions look like?no problems, steri strips  Starting to fall off, itching   7.  Have you been passing gas? BM?passing gas had bm taking stool softner  8.  If a problem or question were to arise who would you call?  Do you know contact numbers for [place], [facility], and [facility]?aware of how to contact all services  9.  How has the walking going?walking around regularly  10.  How are your vitamins and calcium going?  How are you taking them?mvi and calcium started, doesn't like  Current multivitamin due to texture.  Is going to purchase bari chewy vitamin and add iron  Reminded of [facility] appointment 2 week follow up

## 2020-11-22 ENCOUNTER — Encounter: Payer: Self-pay | Admitting: Internal Medicine

## 2020-11-22 NOTE — Telephone Encounter (Signed)
Please advise   Patient was on   lisinopril (ZESTRIL) 20 MG tablet Take 1 tablet (20 mg total) by mouth daily.

## 2020-11-22 NOTE — Telephone Encounter (Signed)
New message sent in form Patient: Debbie Townsend, Debbie Townsend, Nino Glow, MD 1 hour ago (10:09 AM)   Hey there. I woke up this morning with a massive migraine/headache and checked my blood pressure and it was 140/90. I have taken my migraine medicine and have not had any relief. What are your thoughts? Also I'm still dealing with this yeast infection. I took the medicine the day you called the prescription in. Not sure what else to do? I called the office this morning and left a message as well. Please let me know your thoughts as soon as possible.

## 2020-11-22 NOTE — Telephone Encounter (Signed)
Pt called she is having a bad headache and her BP is 140/90  She said she was told to call if it got higher than 130

## 2020-11-22 NOTE — Telephone Encounter (Signed)
Added to 11/16/20 Patient message encounter .

## 2020-11-23 ENCOUNTER — Other Ambulatory Visit: Payer: Self-pay | Admitting: Internal Medicine

## 2020-11-23 DIAGNOSIS — B373 Candidiasis of vulva and vagina: Secondary | ICD-10-CM

## 2020-11-23 DIAGNOSIS — B3731 Acute candidiasis of vulva and vagina: Secondary | ICD-10-CM

## 2020-11-23 MED ORDER — FLUCONAZOLE 150 MG PO TABS: 150.0000 mg | ORAL_TABLET | Freq: Once | ORAL | 0 refills | Status: AC

## 2020-11-24 ENCOUNTER — Other Ambulatory Visit: Payer: Self-pay

## 2020-11-24 ENCOUNTER — Encounter: Payer: BC Managed Care – PPO | Attending: Surgery | Admitting: Dietician

## 2020-11-24 ENCOUNTER — Encounter: Payer: Self-pay | Admitting: Dietician

## 2020-11-24 VITALS — Ht 66.0 in | Wt 270.6 lb

## 2020-11-24 DIAGNOSIS — Z6841 Body Mass Index (BMI) 40.0 and over, adult: Secondary | ICD-10-CM | POA: Insufficient documentation

## 2020-11-24 NOTE — Progress Notes (Signed)
Nutrition Therapy for Post-Operative Bariatric Diet Follow-up visit:  2 weeks post-op sleeve gastrectomy Surgery  Medical Nutrition Therapy:  Appt start time: 1050 end time:  2585.  Anthropometrics: Weight: 270.6lbs Height: 5'6"  Date 10/17/20 11/24/20  BMI 45.9 43.7  Weight (lbs) 284.5 270.6  Skeletal muscle (lbs) 77.4 73.0  % body fat 51.5 51.9   Clinical: Medications: Aimovig, docusate sodium, fluconazole, lisinopril, otc sleep aid, pantoprazole, rimegepant sulfate, sertraline  Supplementation: bariatric multivitamin 2x daily, calcium 3x daily  Health/ medical history changes: no changes per patient  GI symptoms: N/V/D/C: occasional mild nausea; had diarrhea after starting protein shakes, but now resolved; now having constipation taking stool softener which is helping.  Dumping Syndrome: no  Hair loss: no (did have hair loss prior to surgery, no changes)  Dietary/ Lifestyle Progress: . Patient denies any significant side effects or issues since surgery. She has begun experiencing some headaches this week.  . She is following bariatric diet guidelines closely; she reports some anxiety over making sure her weight is successful and maintaining weight loss. This anxiety increases motivation to adhere to diet.  . She had some diarrhea initially but no longer, is now dealing more with constipation; stool softener is helping.  . Patient reports having used a particular container for drinking water in the past, but it has a built-in straw. She feels she would drink more water if she was able to use this container.  Dietary recall: Eating pattern: 5 liquid based meals/ snacks   Breakfast: 9am protein shake; finishes within 30 minutes Snack: none  Lunch: chicken broth or cream of chicken soup 1/2 can Snack: 3-4pm low sugar [place] yogurt  or protein shake Dinner: 7pm soup Snack: protein shake or yogurt (opposite of pm snack)  Fluid intake: protein shakes 22oz, water 32oz, soup  8-16oz, occasional gatorade or powerade zero = 58-68oz daily  Estimated total protein intake: 70-80g daily Bariatric diet adherence:  . Using straws: no . Drinking fluids during meals: n/a . Carbonated beverages: no  Recent physical activity:  Walking on long driveway 2x a day   Nutrition Intervention:   . Reviewed progress since surgery. . Discussed reasons for avoiding use of straws, and how to safely test use of her preferred cup with a built-in straw. . Instructed on advancing diet to include solid protein foods; discussed importance of slow eating, chewing thoroughly, and avoiding fluids during meals. . Advised close monitoring of protein and fluid intake.  Marland Kitchen Discussed reducing protein shakes as consumption of solid proteins increases.  . Discussed option of reintroducing caffeine containing beverages in effort to prevent migraines, per patient request. Also discussed sweeteners that could be migraine triggers per patient question. Marland Kitchen Updated goals with input from patient.  Nutritional Diagnosis:  [place]-3.3 Overweight/obesity As related to history of excess calories and inadequate physical activity.  As evidenced by patient with current BMI of 43.7, following bariatric guidelines for ongoing weight loss after sleeve gastrectomy.  Teaching Method Utilized:  Visual Auditory Hands on  Materials provided:  Phase 3 and 4 bariatric diet handouts  Learning Readiness:   Change in progress  Barriers to learning/adherence to lifestyle change: none  Demonstrated degree of understanding via:  Teach Back      Plan: . Return for 2 month post-op MNT visit on 01/11/21 at 5:00pm

## 2020-11-24 NOTE — Patient Instructions (Signed)
   If you try coffee, start with 4-6oz and/or 1/2-caffeine if needed to prevent migraines.   Begin trying solid protein foods starting with 1oz or 1/4 cup, eat slowly and chew thoroughly. As intake of solid food increases, begin gradually decreasing protein shakes. Monitor intake to make sure you meet the goal of at least 60grams protein daily.   If you want to use your regular water container for drinking water, try a few sips to make sure the straw does not result in pressure, bloating, or nausea.   The sweetener that can be a migraine trigger is aspartame, brand name Equal or blue packets.  splenda or sucralose - yellow packets, stevia - green packets, saccharin (sweet n low) - pink packets.

## 2020-11-28 DIAGNOSIS — R110 Nausea: Secondary | ICD-10-CM | POA: Diagnosis not present

## 2020-11-28 DIAGNOSIS — Z20828 Contact with and (suspected) exposure to other viral communicable diseases: Secondary | ICD-10-CM | POA: Diagnosis not present

## 2020-11-28 DIAGNOSIS — B349 Viral infection, unspecified: Secondary | ICD-10-CM | POA: Diagnosis not present

## 2020-11-29 ENCOUNTER — Other Ambulatory Visit: Payer: Self-pay

## 2020-11-29 ENCOUNTER — Other Ambulatory Visit (INDEPENDENT_AMBULATORY_CARE_PROVIDER_SITE_OTHER): Payer: BC Managed Care – PPO

## 2020-11-29 ENCOUNTER — Other Ambulatory Visit: Payer: BC Managed Care – PPO

## 2020-11-29 DIAGNOSIS — D72829 Elevated white blood cell count, unspecified: Secondary | ICD-10-CM | POA: Diagnosis not present

## 2020-11-30 ENCOUNTER — Encounter: Payer: Self-pay | Admitting: Dietician

## 2020-11-30 LAB — CBC WITH DIFFERENTIAL/PLATELET
Basophils Absolute: 0.1 10*3/uL (ref 0.0–0.1)
Basophils Relative: 1.3 % (ref 0.0–3.0)
Eosinophils Absolute: 0.2 10*3/uL (ref 0.0–0.7)
Eosinophils Relative: 2.6 % (ref 0.0–5.0)
HCT: 41.8 % (ref 36.0–46.0)
Hemoglobin: 13.9 g/dL (ref 12.0–15.0)
Lymphocytes Relative: 21 % (ref 12.0–46.0)
Lymphs Abs: 2 10*3/uL (ref 0.7–4.0)
MCHC: 33.3 g/dL (ref 30.0–36.0)
MCV: 84.1 fl (ref 78.0–100.0)
Monocytes Absolute: 0.5 10*3/uL (ref 0.1–1.0)
Monocytes Relative: 5.6 % (ref 3.0–12.0)
Neutro Abs: 6.6 10*3/uL (ref 1.4–7.7)
Neutrophils Relative %: 69.5 % (ref 43.0–77.0)
Platelets: 324 10*3/uL (ref 150.0–400.0)
RBC: 4.97 Mil/uL (ref 3.87–5.11)
RDW: 14 % (ref 11.5–15.5)
WBC: 9.5 10*3/uL (ref 4.0–10.5)

## 2020-11-30 NOTE — Progress Notes (Signed)
email communication with patient today:  Debbie Townsend, When we met last week you had lost about 14lbs since surgery, so that was great! It is OK not to weigh yourself at home if it adds more stress for you. I would not weigh any more often than once a week. Weight naturally fluctuates from day to day due to fluid and bowel contents so could cause unnecessary worry.  . There is no "right" rate for weight loss. There is also no "right" way to feel regarding hunger. It's possible that feeling stressed about it could make hunger symptoms worse too. Some people report feeling hungry a few weeks after surgery, others don't feel hunger until months afterwards.  . You won't stretch your stomach out, that is a myth; although some people are able to eat larger portions over time, usually 1-2 years after surgery. Right now is the time to get used to how your body feels when you have had enough to eat.if you feel some pressure building at the top of your stomach/ bottom of your chest, then stop eating. If you try about  cup or so of food and feel like you can eat more, that is ok, you can eat a little more. If you eat slowly and relax when eating, your body will tell you when you have had enough. Trust that.if you are worried about it, you might convince yourself you are still hungry, or convince yourself you have eaten too much even when you haven't.  . As far as the veggies go, it's best to start with softly cooked veggies that are not likely to cause gas (onions and mushrooms are gassy foods for many people); green beans, cooked carrots, cooked zucchini might be gentler to your system. But if you didn't feel adverse effects, there is nothing to worry about.  . The main goal is to take in enough protein and enough fluids. If you can meet those goals easily enough with eating some veggies too, then there is no problem.  . Have you tried using the water bottle with the straw? If so, it is possible that you could be  swallowing more air and that could be causing more rumbling or growling in your stomach.   So bottom line is, you are doing fine, and you will continue to lose weight. Relax when you eat. Take a few minutes to breath slow and deep if needed before eating, so you can relax and be in tune with your body's signals.  I hope that helps you, I'm sorry this is causing anxiety for you! [name]  From: Debbie Townsend <[email or web address]>  Sent: Thursday, November 30, 2020 11:30 AM To: Debbie Townsend <[name]> Subject: Morning  This message was sent securely using [employer]    *Caution - External email - see footer for warnings* Hey there,   I hope you are doing well. I wanted to reach out for support or something. I am honestly not sure -haha!  I am really struggling with a couple of things. I keep getting hunger pains (everyone says I shouldn't have them yet or at all). I am trying hard to listen to my body and I don't know if I am getting mixed signals or just not understanding my body yet. I am terrified I am eating too much, I am terrified I am going to stretch my stomach out.all of it. I am paranoid, I know, about all of this. Probably thinking too much into it. When I eat, I get that  hunger feeling but Debbie Townsend said that was just the food hitting my new stomach. I have adjusted to that feeling. For example: I have been up since 8am. I had a protein shake at 9am and my stomach is growling now. Is that normal? Am I crazy?   I don't feel like I am losing weight. I haven't weighed myself since my visit with you. I am trying not to weigh myself because I feel that the scale may upset me even more. I haven't had my 1st appt with the surgeon yet. That appt is on the 12.22.21. I am hoping maybe she will be able to get me some input as well.   Should I be eating every 2 or 3 hours? A friend of mine that has surgery in March said I am eating things that are too hard on my stomach/hard to process in my  stomach - like I sauteed some mushrooms, onions and peppers for dinner last night with 2 piece of chicken from a skewer. Is that not okay to eat? Should I be sticking with tuna pouchs, chicken pouches, softer foods?   Sorry this so lengthy and random. I feel so lost though. I feel like I am not being successful.   Thank for your time.    [place] Coordinator      [address] [place]    [place], [place] [address]             Mobile:    [phone]    Fax:   [phone]    Email:    [email or web address]          [email or web address]

## 2020-12-07 ENCOUNTER — Ambulatory Visit
Admission: RE | Admit: 2020-12-07 | Discharge: 2020-12-07 | Disposition: A | Discharge status: Home / Self Care | Payer: BC Managed Care – PPO | Source: Ambulatory Visit | Attending: Internal Medicine | Admitting: Internal Medicine

## 2020-12-07 ENCOUNTER — Other Ambulatory Visit: Payer: Self-pay

## 2020-12-07 DIAGNOSIS — Z1231 Encounter for screening mammogram for malignant neoplasm of breast: Secondary | ICD-10-CM | POA: Diagnosis not present

## 2020-12-14 DIAGNOSIS — H47293 Other optic atrophy, bilateral: Secondary | ICD-10-CM | POA: Diagnosis not present

## 2020-12-14 DIAGNOSIS — Z903 Acquired absence of stomach [part of]: Secondary | ICD-10-CM | POA: Insufficient documentation

## 2020-12-14 DIAGNOSIS — G932 Benign intracranial hypertension: Secondary | ICD-10-CM | POA: Diagnosis not present

## 2020-12-14 HISTORY — DX: Acquired absence of stomach (part of): Z90.3

## 2020-12-27 ENCOUNTER — Encounter: Payer: Self-pay | Admitting: Internal Medicine

## 2020-12-29 DIAGNOSIS — F419 Anxiety disorder, unspecified: Secondary | ICD-10-CM | POA: Diagnosis not present

## 2021-01-11 ENCOUNTER — Encounter: Payer: BC Managed Care – PPO | Attending: Surgery | Admitting: Dietician

## 2021-01-11 ENCOUNTER — Ambulatory Visit: Payer: BC Managed Care – PPO | Admitting: Dietician

## 2021-01-11 ENCOUNTER — Other Ambulatory Visit: Payer: Self-pay

## 2021-01-11 ENCOUNTER — Encounter: Payer: Self-pay | Admitting: Dietician

## 2021-01-11 DIAGNOSIS — Z6841 Body Mass Index (BMI) 40.0 and over, adult: Secondary | ICD-10-CM | POA: Insufficient documentation

## 2021-01-11 NOTE — Patient Instructions (Signed)
   Continue to eat 1-2oz of protein food each meal or snack. Include a vegetable as able after eating the protein. Continue to expand the variety of vegetables.   Consider tracking intake to keep up with progress and goals. Pay close attention to portions at dinner to determine whether overeating could be causing some nausea.   Resume some regular walking/ activity.
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
## 2021-01-17 ENCOUNTER — Ambulatory Visit: Payer: BC Managed Care – PPO | Admitting: Dietician

## 2021-01-19 DIAGNOSIS — F419 Anxiety disorder, unspecified: Secondary | ICD-10-CM | POA: Diagnosis not present

## 2021-02-08 ENCOUNTER — Encounter: Payer: Self-pay | Admitting: Internal Medicine

## 2021-02-09 ENCOUNTER — Other Ambulatory Visit: Payer: Self-pay | Admitting: Internal Medicine

## 2021-02-09 DIAGNOSIS — M545 Low back pain, unspecified: Secondary | ICD-10-CM

## 2021-02-09 DIAGNOSIS — F419 Anxiety disorder, unspecified: Secondary | ICD-10-CM | POA: Diagnosis not present

## 2021-02-09 MED ORDER — CYCLOBENZAPRINE HCL 5 MG PO TABS: 5.0000 mg | ORAL_TABLET | Freq: Every evening | ORAL | 11 refills | Status: DC | PRN

## 2021-02-23 DIAGNOSIS — F419 Anxiety disorder, unspecified: Secondary | ICD-10-CM | POA: Diagnosis not present

## 2021-03-13 DIAGNOSIS — G932 Benign intracranial hypertension: Secondary | ICD-10-CM | POA: Diagnosis not present

## 2021-03-19 ENCOUNTER — Emergency Department (HOSPITAL_COMMUNITY): Payer: BC Managed Care – PPO

## 2021-03-19 ENCOUNTER — Other Ambulatory Visit: Payer: Self-pay

## 2021-03-19 ENCOUNTER — Emergency Department (HOSPITAL_COMMUNITY)
Admission: EM | Admit: 2021-03-19 | Discharge: 2021-03-19 | Disposition: A | Discharge status: Home / Self Care | Payer: BC Managed Care – PPO | Attending: Emergency Medicine | Admitting: Emergency Medicine

## 2021-03-19 ENCOUNTER — Encounter (HOSPITAL_COMMUNITY): Payer: Self-pay | Admitting: Emergency Medicine

## 2021-03-19 DIAGNOSIS — Y92838 Other recreation area as the place of occurrence of the external cause: Secondary | ICD-10-CM | POA: Diagnosis not present

## 2021-03-19 DIAGNOSIS — S99912A Unspecified injury of left ankle, initial encounter: Secondary | ICD-10-CM | POA: Diagnosis not present

## 2021-03-19 DIAGNOSIS — Z87891 Personal history of nicotine dependence: Secondary | ICD-10-CM | POA: Diagnosis not present

## 2021-03-19 DIAGNOSIS — W010XXA Fall on same level from slipping, tripping and stumbling without subsequent striking against object, initial encounter: Secondary | ICD-10-CM | POA: Insufficient documentation

## 2021-03-19 DIAGNOSIS — S82832A Other fracture of upper and lower end of left fibula, initial encounter for closed fracture: Secondary | ICD-10-CM

## 2021-03-19 DIAGNOSIS — I10 Essential (primary) hypertension: Secondary | ICD-10-CM | POA: Insufficient documentation

## 2021-03-19 MED ORDER — FENTANYL CITRATE (PF) 100 MCG/2ML IJ SOLN: 100.0000 ug | Freq: Once | INTRAMUSCULAR | Status: DC

## 2021-03-19 MED ORDER — OXYCODONE HCL 5 MG PO TABS: 5.0000 mg | ORAL_TABLET | Freq: Four times a day (QID) | ORAL | 0 refills | Status: AC | PRN

## 2021-03-19 NOTE — ED Provider Notes (Signed)
[facility] DEPT Provider Note   CSN: [id number] Arrival date & time: 03/19/21  1505     History No chief complaint on file.   Debbie Townsend is a 35 y.o. female with history of obesity s/p gastric bypass November 2021, idiopathic intracranial hypertension, chronic low back pain and lumbar surgery in 2015, left foot drop, anxiety presents to the ED for evaluation of sudden onset, severe left ankle pain.  She was at a children's playground when she fell and slipped.  States her left foot twisted and "snapped".  There is constant throbbing pain in the left ankle.  Has not been able to put weight on it.  Ankle is obviously deformed.  Reports longstanding and chronic decreased sensation all over her foot since her back surgery.  Also reports foot drop in that ankle despite previous physical therapy.  She does not have much ability to dorsiflex at baseline.  No changes in her decree sensation.  No other physical injuries.  Reports significant anxiety and patient is teary-eyed at this time.  She last ate approximately at noon today.  HPI     Past Medical History:  Diagnosis Date  . Anxiety   . BRCA negative   . Depression   . Family history of anesthesia complication     mother had Post -op nausea and dizziness.  . Foot drop, left foot   . GERD (gastroesophageal reflux disease)   . Headache   . History of kidney stones   . Hypertension   . Kidney stone   . Pseudotumor cerebri 2007 or 2009    Patient Active Problem List   Diagnosis Date Noted  . Morbid obesity ([place]) 11/14/2020  . Iron deficiency 11/03/2020  . Hair loss 07/03/2020  . Prediabetes 07/03/2020  . Depression, recurrent ([place]) 07/03/2020  . Intractable migraine without aura and without status migrainosus 04/04/2020  . Gastroesophageal reflux disease without esophagitis 03/29/2020  . Chronic midline low back pain 02/13/2020  . CSF leak from nose 12/07/2019  . Other optic atrophy, bilateral  12/07/2019  . IIH (idiopathic intracranial hypertension) 12/07/2019  . Annual physical exam 09/24/2019  . Insomnia 09/24/2019  . Fatigue 09/24/2019  . History of kidney stones 09/24/2019  . Class 3 severe obesity due to excess calories without serious comorbidity with body mass index (BMI) of 40.0 to 44.9 in adult ([place]) 05/03/2019  . Allergic rhinitis 03/24/2019  . Status post laparoscopic hysterectomy 02/12/2019  . Adenomyosis 02/04/2019  . DUB (dysfunctional uterine bleeding) 09/10/2018  . Fatty liver 04/02/2018  . Gallstones 04/02/2018  . Heavy menses 04/02/2018  . FH: breast cancer in first degree relative 04/02/2018  . Anxiety 04/02/2018  . Anxiety and depression 12/16/2016  . Cesarean delivery delivered 03/22/2016  . Rubella non-immune status, antepartum 01/08/2016  . Morbid obesity with BMI of 45.0-49.9, adult ([place]) 01/04/2016  . Essential hypertension 11/13/2015  . Pseudotumor cerebri 11/13/2015  . HNP (herniated nucleus pulposus), lumbar 09/22/2014    Past Surgical History:  Procedure Laterality Date  . ABDOMINAL HYSTERECTOMY    . CESAREAN SECTION     2009/2017  . CHOLECYSTECTOMY N/A 02/04/2019   Procedure: LAPAROSCOPIC CHOLECYSTECTOMY;  Surgeon: Fredirick Maudlin, MD;  Location: [facility] ORS;  Service: General;  Laterality: N/A;  . LAPAROSCOPIC GASTRIC SLEEVE RESECTION N/A 11/14/2020   Procedure: LAPAROSCOPIC GASTRIC SLEEVE RESECTION;  Surgeon: [name], MD;  Location: [facility] ORS;  Service: General;  Laterality: N/A;  . LUMBAR LAMINECTOMY/DECOMPRESSION MICRODISCECTOMY Left 09/22/2014   Procedure: Left Lumbar  four-five microdiskectomy;  Surgeon: Consuella Lose, MD;  Location: [facility] NEURO ORS;  Service: Neurosurgery;  Laterality: Left;  Left Lumbar four-five microdiskectomy  . UPPER GI ENDOSCOPY N/A 11/14/2020   Procedure: UPPER GI ENDOSCOPY;  Surgeon: [name], MD;  Location: [facility] ORS;  Service: General;  Laterality: N/A;  . WISDOM TOOTH EXTRACTION       OB  History    Gravida  2   Para  2   Term  1   Preterm  1   AB      Living  2     SAB      IAB      Ectopic      Multiple      Live Births  2           Family History  Problem Relation Age of Onset  . Cancer Mother        breast dx'ed age 54/49  . Hypertension Mother   . Diabetes Mother   . Hypothyroidism Mother   . Depression Mother   . Breast cancer Mother        75s  . Liver disease Father   . Heart attack Father   . Arthritis Father   . Alcohol abuse Father   . Depression Father   . Drug abuse Father   . Early death Father   . Heart disease Father   . Hypertension Father   . Hepatitis C Father   . Depression Sister   . Hypertension Sister   . Arthritis Sister   . Hypertension Sister   . Cancer Maternal Grandmother        stomach>liver>brain met  . Cancer Maternal Uncle        lung not a smoker     Social History   Tobacco Use  . Smoking status: Former Smoker    Packs/day: 0.50    Types: Cigarettes  . Smokeless tobacco: Never Used  Vaping Use  . Vaping Use: Never used  Substance Use Topics  . Alcohol use: Not Currently  . Drug use: No    Home Medications Prior to Admission medications   Medication Sig Start Date End Date Taking? Authorizing Provider  oxyCODONE (OXY IR/ROXICODONE) 5 MG immediate release tablet Take 1 tablet (5 mg total) by mouth every 6 (six) hours as needed for up to 3 days for severe pain. 03/19/21 03/22/21 Yes Kinnie Feil, PA-C  AIMOVIG 140 MG/ML SOAJ Take 140 mg by mouth every 30 (thirty) days.  06/26/20   [provider]  CALCIUM CITRATE PO Take by mouth.    [provider]  cyclobenzaprine (FLEXERIL) 5 MG tablet Take 1-2 tablets (5-10 mg total) by mouth at bedtime as needed for muscle spasms. 02/09/21   McLean-Scocuzza, Nino Glow, MD  famotidine (PEPCID) 20 MG tablet Take by mouth.    [provider]  LISINOPRIL PO Take by mouth. Patient not taking: Reported on 01/11/2021    [provider]  Multiple Vitamins-Minerals (MULTIVITAMIN WITH MINERALS) tablet Take 1 tablet by mouth daily. Bariatric advantage one a day    [provider]  OVER THE COUNTER MEDICATION Take 1 tablet by mouth at bedtime. Tranquil Sleep    [provider]  pantoprazole (PROTONIX) 40 MG tablet TAKE 1 TABLET BY MOUTH DAILY 11/15/20 11/15/21  [name], MD  Rimegepant Sulfate (NURTEC PO) Take 75 mg by mouth. Once daily prn abortive for migraines    [provider]  sertraline (  ZOLOFT) 100 MG tablet Take 1 tablet (100 mg total) by mouth daily. 03/28/20   McLean-Scocuzza, Nino Glow, MD  traMADol (ULTRAM) 50 MG tablet TAKE 1 TABLET BY MOUTH EVERY 6 HOURS AS NEEDED FOR PAIN Patient not taking: Reported on 01/11/2021 11/15/20 05/14/21  [name], MD  gabapentin (NEURONTIN) 100 MG capsule Take 2 capsules (200 mg total) by mouth every 12 (twelve) hours. 11/15/20 11/24/20  [name], MD    Allergies    Apple, Carrot [daucus carota], Monistat [miconazole], Other, Almond oil, Amoxicillin, Imitrex [sumatriptan], and Topamax [topiramate]  Review of Systems   Review of Systems  Musculoskeletal: Positive for arthralgias, gait problem and joint swelling.  All other systems reviewed and are negative.   Physical Exam Updated Vital Signs BP (!) 149/91   Pulse 65   Temp 98.9 F (37.2 C) (Oral)   Resp 18   LMP 02/15/2019   SpO2 100%   Physical Exam Constitutional:      Appearance: She is well-developed.  HENT:     Head: Normocephalic.     Nose: Nose normal.  Eyes:     General: Lids are normal.  Cardiovascular:     Rate and Rhythm: Normal rate.  Pulmonary:     Effort: Pulmonary effort is normal. No respiratory distress.  Musculoskeletal:     Cervical back: Normal range of motion.     Left ankle: Swelling present. Tenderness present. Decreased range of motion.     Comments: Left ankle: obviously deformed. Left ankle is medially displaced, externally  rotated. Skin is very taut/tenting medially with purple discoloration. Small round abrasion over medial ankle, no other skin abnormalities.  Diffuse tenderness medially/laterally and distal fibula. ROM deferred. Weak but palpable 1+ DP pulse. Unable to pulse PT pulse.  Patient can wiggle her toes. No calf tenderness. No tenderness at mid tibia/fibula, knee.   Neurological:     Mental Status: She is alert.     Comments: Patient reports usual decreased sensation in medial/dorsal/lateral foot at baseline. Normal big toe strength against resistance   Psychiatric:        Behavior: Behavior normal.     ED Results / Procedures / Treatments   Labs (all labs ordered are listed, but only abnormal results are displayed) Labs Reviewed - No data to display  EKG None  Radiology DG Ankle 2 Views Left  Result Date: 03/19/2021 CLINICAL DATA:  Postreduction EXAM: LEFT ANKLE - 2 VIEW COMPARISON:  03/19/2021 FINDINGS: In cast views demonstrate improved alignment at the distal fibular shaft fracture. Old healed tibial fracture again noted. IMPRESSION: Improved angulation at the fibular fracture. Electronically Signed   By: Rolm Baptise M.D.   On: 03/19/2021 17:01   DG Ankle Complete Left  Result Date: 03/19/2021 CLINICAL DATA:  Swelling and pain status post twisting injury. EXAM: LEFT ANKLE COMPLETE - 3+ VIEW COMPARISON:  None. FINDINGS: Mildly displaced, minimally angulated fracture of the distal fibular diaphysis. The ankle mortise is disrupted with significant widening of the medial clear space. Healed fracture deformity of the mid tibial diaphysis. IMPRESSION: 1. Mildly displaced and angulated fracture of the distal fibular diaphysis. 2. Disruption of the ankle mortise with widening of medial clear space consistent with significant ligamentous injury. Electronically Signed   By: Miachel Roux M.D.   On: 03/19/2021 15:54    Procedures Procedures   Medications Ordered in ED Medications - No data to  display  ED Course  I have reviewed the triage vital signs and the nursing  notes.  Pertinent labs & imaging results that were available during my care of the patient were reviewed by me and considered in my medical decision making (see chart for details).  Clinical Course as of 03/19/21 1749  Mon Mar 19, 2021  1545 I have evaluated the patient.  Have asked charge RN Marzetta Board to prioritize room for her conscious sedation/reduction [CG]  1602 DG Ankle Complete Left IMPRESSION: 1. Mildly displaced and angulated fracture of the distal fibular diaphysis. 2. Disruption of the ankle mortise with widening of medial clear space consistent with significant ligamentous injury [CG]  1747 I spoke to Dr. Tamera Punt with [facility].  Has reviewed patient's x-rays.  Agrees with ER management here.  Would like patient to be seen in the office by Dr. Lucia Gaskins this week. [CG]    Clinical Course User Index [CG] Arlean Hopping   MDM Rules/Calculators/A&P                          35 year old female with left ankle deformity after mechanical fall.  Exam reveals obviously deformed ankle.  Palpable DP pulse, unable to palpate PT pulses due to deformity and pain.  She has history of foot drop and decreased sensation in that foot and reports no changes to this.  Her range of motion, sensation exam here is similar to baseline per patient report.  No other physical injuries reported.  Patient discussed with EDP Messick who evaluated the patient and reduced fracture/dislocation at bedside without conscious sedation.  Posterior short leg and sugar tong splint applied by Ortho tech.  Post reduction x-rays ordered and reviewed and show improvement in alignment.  Patient reevaluated and has intact vascular status, reports sensation is at her baseline.  Pain has improved.  Will consult orthopedic surgery given degree of injury and will likely need close follow-up and operative intervention.  Anticipate discharge with  pain control, elevation, close ortho follow up. Return precautions given.   1750: Spoke with orthopedics, see above.  Will discharge patient with oxycodone, crutches, elevation.  Instructed to call Ortho clinic and make an appointment this week for follow-up. Final Clinical Impression(s) / ED Diagnoses Final diagnoses:  Other closed fracture of distal end of left fibula, initial encounter    Rx / DC Orders ED Discharge Orders         Ordered    oxyCODONE (OXY IR/ROXICODONE) 5 MG immediate release tablet  Every 6 hours PRN        03/19/21 1705           Kinnie Feil, PA-C 03/19/21 1749    Valarie Merino, MD 03/20/21 [phone]

## 2021-03-19 NOTE — Discharge Instructions (Addendum)
You have a fracture of your fibula.  Your ankle also slipped out of place and this likely caused injury to the surrounding tissues and ligaments  Your ankle was relocated back into place  Use crutches to walk and do not put any weight on your foot/ankle.  Keep your foot elevated as much as possible.  Call Dr. Pollie Friar office tomorrow morning and request an appointment this week for follow-up.  I have spoken with Dr. Tamera Punt at [facility] and his clinic will accommodate an appointment for you this week   For pain and inflammation you can use a combination of ibuprofen and acetaminophen.  Take [phone] mg acetaminophen (tylenol) every 6 hours or 600 mg ibuprofen (advil, motrin) every 6 hours.  You can take these separately or combine them every 6 hours for maximum pain control. Do not exceed 4,000 mg acetaminophen or 2,400 mg ibuprofen in a 24 hour period.  Do not take ibuprofen containing products if you have history of kidney disease, ulcers, GI bleeding, severe acid reflux, or take a blood thinner.  Do not take acetaminophen if you have liver disease.   For break through and/or severe pain despite ibuprofen and acetaminophen regimen, take 5 mg oxycodone every 4 hours.  Oxycodone is a narcotic pain medication that has risk of overdose, death, dependence and abuse. Mild and expected side effects include nausea, stomach upset, drowsiness, constipation. Do not consume alcohol, drive or use heavy machinery while taking this medication. Do not leave unattended around children. Flush any remaining pills that you do not use and do not share.  The emergency department has a strict policy regarding prescription of narcotic medications. We prescribe a short course for acute, new pain or injuries. We are unable to refill this medication in the emergency department for chronic pain or repeatedly.  Refill need to be done by specialist or primary care provider or pain clinic.  Contact your primary care  provider or specialist for chronic pain management and refill on narcotic medications.

## 2021-03-19 NOTE — ED Triage Notes (Signed)
Patient BIBA c/o left ankle pain after twisting ankle today. EMS reports the ankle is swollen.   BP 140/100 HR 86 RR 18 SpO2 98% RA CBG 156

## 2021-03-19 NOTE — Progress Notes (Signed)
Orthopedic Tech Progress Note Patient Details:  Debbie Townsend [birth year] [id number]  Ortho Devices Type of Ortho Device: Ace wrap,Stirrup splint,Short leg splint,Crutches Ortho Device/Splint Interventions: Application   Post Interventions Patient Tolerated: Well Instructions Provided: Care of device   LUDELLA PRANGER 03/19/2021, 4:22 PM

## 2021-03-19 NOTE — ED Provider Notes (Signed)
       Procedures .Ortho Injury Treatment  Date/Time: 03/19/2021 5:52 PM Performed by: Valarie Merino, MD Authorized by: Valarie Merino, MD   Consent:    Consent obtained:  Verbal   Consent given by:  Patient   Risks discussed:  Fracture, irreducible dislocation, nerve damage, recurrent dislocation, restricted joint movement, stiffness and vascular damage   Alternatives discussed:  No treatmentInjury location: ankle Location details: left ankle Injury type: fracture-dislocation Pre-procedure neurovascular assessment: neurovascularly intact  Anesthesia: Local anesthesia used: no  Patient sedated: NoManipulation performed: yes Skin traction used: no Skeletal traction used: no Reduction successful: yes X-ray confirmed reduction: yes Immobilization: splint Splint type: short leg and ankle stirrup Splint Applied by: ED Provider and Ortho Tech Post-procedure neurovascular assessment: post-procedure neurovascularly intact       Valarie Merino, MD 03/19/21 1753

## 2021-03-19 NOTE — Progress Notes (Signed)
Orthopedic Tech Progress Note Patient Details:  Debbie Townsend Aug 31, 1986 [id number]  Patient ID: Debbie Townsend, female   DOB: 09-29-1986, 35 y.o.   MRN: [id number]   Debbie Townsend 03/19/2021, 4:23 PMLeft leg reduction

## 2021-03-20 ENCOUNTER — Encounter: Payer: Self-pay | Admitting: Internal Medicine

## 2021-03-21 ENCOUNTER — Other Ambulatory Visit: Payer: Self-pay

## 2021-03-21 ENCOUNTER — Encounter: Payer: Self-pay | Admitting: Orthopedic Surgery

## 2021-03-21 ENCOUNTER — Other Ambulatory Visit
Admission: RE | Admit: 2021-03-21 | Discharge: 2021-03-21 | Disposition: A | Discharge status: Home / Self Care | Payer: BC Managed Care – PPO | Source: Ambulatory Visit | Attending: Orthopedic Surgery | Admitting: Orthopedic Surgery

## 2021-03-21 ENCOUNTER — Other Ambulatory Visit: Payer: Self-pay | Admitting: Orthopedic Surgery

## 2021-03-21 DIAGNOSIS — Z20822 Contact with and (suspected) exposure to covid-19: Secondary | ICD-10-CM | POA: Insufficient documentation

## 2021-03-21 DIAGNOSIS — S82831A Other fracture of upper and lower end of right fibula, initial encounter for closed fracture: Secondary | ICD-10-CM | POA: Diagnosis not present

## 2021-03-21 DIAGNOSIS — Z01812 Encounter for preprocedural laboratory examination: Secondary | ICD-10-CM | POA: Insufficient documentation

## 2021-03-21 DIAGNOSIS — S93432A Sprain of tibiofibular ligament of left ankle, initial encounter: Secondary | ICD-10-CM | POA: Diagnosis not present

## 2021-03-21 DIAGNOSIS — X58XXXA Exposure to other specified factors, initial encounter: Secondary | ICD-10-CM | POA: Diagnosis not present

## 2021-03-21 DIAGNOSIS — Z79899 Other long term (current) drug therapy: Secondary | ICD-10-CM | POA: Diagnosis not present

## 2021-03-21 DIAGNOSIS — S82832A Other fracture of upper and lower end of left fibula, initial encounter for closed fracture: Secondary | ICD-10-CM | POA: Diagnosis not present

## 2021-03-21 DIAGNOSIS — S93431A Sprain of tibiofibular ligament of right ankle, initial encounter: Secondary | ICD-10-CM | POA: Diagnosis not present

## 2021-03-21 LAB — SARS CORONAVIRUS 2 (TAT 6-24 HRS): SARS Coronavirus 2: NEGATIVE

## 2021-03-23 ENCOUNTER — Ambulatory Visit
Admission: RE | Admit: 2021-03-23 | Discharge: 2021-03-23 | Disposition: A | Discharge status: Home / Self Care | Payer: BC Managed Care – PPO | Attending: Orthopedic Surgery | Admitting: Orthopedic Surgery

## 2021-03-23 ENCOUNTER — Other Ambulatory Visit: Payer: Self-pay

## 2021-03-23 ENCOUNTER — Ambulatory Visit: Payer: BC Managed Care – PPO

## 2021-03-23 ENCOUNTER — Encounter
Admission: RE | Disposition: A | Discharge status: Home / Self Care | Payer: Self-pay | Source: Home / Self Care | Attending: Orthopedic Surgery

## 2021-03-23 ENCOUNTER — Ambulatory Visit: Payer: BC Managed Care – PPO | Admitting: Anesthesiology

## 2021-03-23 ENCOUNTER — Encounter: Payer: Self-pay | Admitting: Orthopedic Surgery

## 2021-03-23 DIAGNOSIS — X58XXXA Exposure to other specified factors, initial encounter: Secondary | ICD-10-CM | POA: Diagnosis not present

## 2021-03-23 DIAGNOSIS — Z20822 Contact with and (suspected) exposure to covid-19: Secondary | ICD-10-CM | POA: Diagnosis not present

## 2021-03-23 DIAGNOSIS — G8918 Other acute postprocedural pain: Secondary | ICD-10-CM | POA: Diagnosis not present

## 2021-03-23 DIAGNOSIS — S93432A Sprain of tibiofibular ligament of left ankle, initial encounter: Secondary | ICD-10-CM | POA: Diagnosis not present

## 2021-03-23 DIAGNOSIS — S82832A Other fracture of upper and lower end of left fibula, initial encounter for closed fracture: Secondary | ICD-10-CM | POA: Diagnosis not present

## 2021-03-23 DIAGNOSIS — S9302XA Subluxation of left ankle joint, initial encounter: Secondary | ICD-10-CM | POA: Diagnosis not present

## 2021-03-23 DIAGNOSIS — S82831A Other fracture of upper and lower end of right fibula, initial encounter for closed fracture: Secondary | ICD-10-CM | POA: Insufficient documentation

## 2021-03-23 DIAGNOSIS — Z79899 Other long term (current) drug therapy: Secondary | ICD-10-CM | POA: Insufficient documentation

## 2021-03-23 DIAGNOSIS — S93431A Sprain of tibiofibular ligament of right ankle, initial encounter: Secondary | ICD-10-CM | POA: Insufficient documentation

## 2021-03-23 DIAGNOSIS — M25571 Pain in right ankle and joints of right foot: Secondary | ICD-10-CM | POA: Diagnosis not present

## 2021-03-23 HISTORY — PX: ORIF ANKLE FRACTURE: SHX5408

## 2021-03-23 HISTORY — DX: Other specified postprocedural states: Z98.890

## 2021-03-23 HISTORY — DX: Nausea with vomiting, unspecified: R11.2

## 2021-03-23 SURGERY — OPEN REDUCTION INTERNAL FIXATION (ORIF) ANKLE FRACTURE
Anesthesia: General | Site: Ankle | Laterality: Left

## 2021-03-23 MED ORDER — EPHEDRINE SULFATE 50 MG/ML IJ SOLN
INTRAMUSCULAR | Status: DC | PRN
  Administered 2021-03-23 (×2): 10 mg via INTRAVENOUS
  Administered 2021-03-23 (×2): 5 mg via INTRAVENOUS

## 2021-03-23 MED ORDER — AMISULPRIDE (ANTIEMETIC) 5 MG/2ML IV SOLN
10.0000 mg | Freq: Once | INTRAVENOUS | Status: AC
  Administered 2021-03-23: 10 mg via INTRAVENOUS

## 2021-03-23 MED ORDER — DEXAMETHASONE SODIUM PHOSPHATE 4 MG/ML IJ SOLN
INTRAMUSCULAR | Status: DC | PRN
  Administered 2021-03-23: 4 mg via INTRAVENOUS

## 2021-03-23 MED ORDER — LIDOCAINE HCL (CARDIAC) PF 100 MG/5ML IV SOSY
PREFILLED_SYRINGE | INTRAVENOUS | Status: DC | PRN
  Administered 2021-03-23: 40 mg via INTRATRACHEAL

## 2021-03-23 MED ORDER — BUPIVACAINE LIPOSOME 1.3 % IJ SUSP
INTRAMUSCULAR | Status: DC | PRN
  Administered 2021-03-23: 20 mL

## 2021-03-23 MED ORDER — FENTANYL CITRATE (PF) 100 MCG/2ML IJ SOLN: 25.0000 ug | INTRAMUSCULAR | Status: DC | PRN

## 2021-03-23 MED ORDER — SCOPOLAMINE 1 MG/3DAYS TD PT72
1.0000 | MEDICATED_PATCH | Freq: Once | TRANSDERMAL | Status: DC
  Administered 2021-03-23: 1.5 mg via TRANSDERMAL

## 2021-03-23 MED ORDER — GLYCOPYRROLATE 0.2 MG/ML IJ SOLN
INTRAMUSCULAR | Status: DC | PRN
  Administered 2021-03-23: .1 mg via INTRAVENOUS

## 2021-03-23 MED ORDER — MIDAZOLAM HCL 5 MG/5ML IJ SOLN
INTRAMUSCULAR | Status: DC | PRN
  Administered 2021-03-23: 2 mg via INTRAVENOUS

## 2021-03-23 MED ORDER — LACTATED RINGERS IV SOLN: INTRAVENOUS | Status: DC

## 2021-03-23 MED ORDER — CLINDAMYCIN PHOSPHATE 900 MG/50ML IV SOLN
900.0000 mg | INTRAVENOUS | Status: AC
  Administered 2021-03-23: 900 mg via INTRAVENOUS

## 2021-03-23 MED ORDER — ONDANSETRON HCL 4 MG/2ML IJ SOLN: 4.0000 mg | Freq: Once | INTRAMUSCULAR | Status: DC | PRN

## 2021-03-23 MED ORDER — ROPIVACAINE HCL 5 MG/ML IJ SOLN
INTRAMUSCULAR | Status: DC | PRN
  Administered 2021-03-23: 40 mL via PERINEURAL

## 2021-03-23 MED ORDER — ONDANSETRON HCL 4 MG/2ML IJ SOLN
INTRAMUSCULAR | Status: DC | PRN
  Administered 2021-03-23: 4 mg via INTRAVENOUS

## 2021-03-23 MED ORDER — PROPOFOL 10 MG/ML IV BOLUS
INTRAVENOUS | Status: DC | PRN
  Administered 2021-03-23: 150 mg via INTRAVENOUS

## 2021-03-23 MED ORDER — FENTANYL CITRATE (PF) 100 MCG/2ML IJ SOLN
INTRAMUSCULAR | Status: DC | PRN
  Administered 2021-03-23: 100 ug via INTRAVENOUS

## 2021-03-23 SURGICAL SUPPLY — 58 items
APL PRP STRL LF DISP 70% ISPRP (MISCELLANEOUS) ×2
BIT DRILL 2.0 (BIT) ×1 IMPLANT
BLADE SURG 15 STRL LF DISP TIS (BLADE) IMPLANT
BLADE SURG 15 STRL SS (BLADE) ×2
BLADE SURG SZ10 CARB STEEL (BLADE) ×4 IMPLANT
BNDG CMPR STD VLCR NS LF 5.8X4 (GAUZE/BANDAGES/DRESSINGS) ×1
BNDG CMPR STD VLCR NS LF 5.8X6 (GAUZE/BANDAGES/DRESSINGS) ×1
BNDG COHESIVE 4X5 TAN STRL (GAUZE/BANDAGES/DRESSINGS) ×2 IMPLANT
BNDG ELASTIC 4X5.8 VLCR NS LF (GAUZE/BANDAGES/DRESSINGS) ×2 IMPLANT
BNDG ELASTIC 6X5.8 VLCR NS LF (GAUZE/BANDAGES/DRESSINGS) ×2 IMPLANT
BNDG ESMARK 4X12 TAN STRL LF (GAUZE/BANDAGES/DRESSINGS) ×2 IMPLANT
CANISTER SUCT 1200ML W/VALVE (MISCELLANEOUS) ×2 IMPLANT
CHLORAPREP W/TINT 26 (MISCELLANEOUS) ×3 IMPLANT
COVER LIGHT HANDLE FLEXIBLE (MISCELLANEOUS) ×2 IMPLANT
CUFF TOURN SGL QUICK 34 (TOURNIQUET CUFF) ×2
CUFF TRNQT CYL 34X4X40X1 (TOURNIQUET CUFF) IMPLANT
DRAPE C-ARM XRAY 36X54 (DRAPES) ×3 IMPLANT
DRAPE C-ARMOR (DRAPES) ×2 IMPLANT
DRAPE IMP U-DRAPE 54X76 (DRAPES) ×2 IMPLANT
DRILL 2.6X122MM WL AO SHAFT (BIT) ×1 IMPLANT
DRILL OVER 2.7X220 (BIT) ×1 IMPLANT
ELECT REM PT RETURN 9FT ADLT (ELECTROSURGICAL) ×2
ELECTRODE REM PT RTRN 9FT ADLT (ELECTROSURGICAL) ×1 IMPLANT
GAUZE SPONGE 4X4 12PLY STRL (GAUZE/BANDAGES/DRESSINGS) ×2 IMPLANT
GAUZE XEROFORM 1X8 LF (GAUZE/BANDAGES/DRESSINGS) ×2 IMPLANT
GLOVE SRG 8 PF TXTR STRL LF DI (GLOVE) ×1 IMPLANT
GLOVE SURG ENC MOIS LTX SZ7.5 (GLOVE) ×2 IMPLANT
GLOVE SURG UNDER POLY LF SZ8 (GLOVE) ×2
GOWN STRL REIN 2XL XLG LVL4 (GOWN DISPOSABLE) ×2 IMPLANT
GOWN STRL REUS W/ TWL LRG LVL3 (GOWN DISPOSABLE) ×1 IMPLANT
GOWN STRL REUS W/TWL LRG LVL3 (GOWN DISPOSABLE) ×2
K-WIRE OLIVE 1.2X65 (WIRE) ×4
KIT TURNOVER KIT A (KITS) ×2 IMPLANT
KWIRE OLIVE 1.2X65 (WIRE) IMPLANT
NDL HYPO 21X1.5 SAFETY (NEEDLE) IMPLANT
NEEDLE HYPO 21X1.5 SAFETY (NEEDLE) ×2 IMPLANT
NS IRRIG 500ML POUR BTL (IV SOLUTION) ×2 IMPLANT
PACK EXTREMITY ARMC (MISCELLANEOUS) ×2 IMPLANT
PADDING CAST 4IN STRL (MISCELLANEOUS) ×1
PADDING CAST BLEND 4X4 NS (MISCELLANEOUS) ×6 IMPLANT
PADDING CAST BLEND 4X4 STRL (MISCELLANEOUS) ×1 IMPLANT
PADDING CAST BLEND 6X4 STRL (MISCELLANEOUS) ×2 IMPLANT
PADDING STRL CAST 6IN (MISCELLANEOUS) ×2
PENCIL SMOKE EVACUATOR (MISCELLANEOUS) ×2 IMPLANT
PLATE VARIAX FIBULA 7H (Plate) ×1 IMPLANT
SCREW BONE 14MMX3.5MM (Screw) ×2 IMPLANT
SCREW BONE NON-LCKING 3.5X12MM (Screw) ×5 IMPLANT
SPLINT FAST PLASTER 5X30 (CAST SUPPLIES) ×1
SPLINT PLASTER CAST FAST 5X30 (CAST SUPPLIES) ×1 IMPLANT
SPONGE LAP 18X18 RF (DISPOSABLE) ×4 IMPLANT
STAPLER SKIN PROX 35W (STAPLE) ×2 IMPLANT
STOCKINETTE STRL 6IN 960660 (GAUZE/BANDAGES/DRESSINGS) ×2 IMPLANT
SUT VIC AB 0 CT1 27 (SUTURE) ×2
SUT VIC AB 0 CT1 27XCR 8 STRN (SUTURE) ×1 IMPLANT
SUT VIC AB 3-0 SH 27 (SUTURE) ×2
SUT VIC AB 3-0 SH 27X BRD (SUTURE) ×1 IMPLANT
SYNDESMOSIS TIGHTROPE XP (Orthopedic Implant) ×2 IMPLANT
TOWEL OR 17X26 4PK STRL BLUE (TOWEL DISPOSABLE) ×2 IMPLANT

## 2021-03-23 NOTE — Transfer of Care (Signed)
Immediate Anesthesia Transfer of Care Note  Patient: Debbie Townsend  Procedure(s) Performed: OPEN REDUCTION INTERNAL FIXATION (ORIF) Left lateral malleolus with syndesmosis repair (Left Ankle)  Patient Location: PACU  Anesthesia Type: General LMA  Level of Consciousness: awake, alert  and patient cooperative  Airway and Oxygen Therapy: Patient Spontanous Breathing and Patient connected to supplemental oxygen  Post-op Assessment: Post-op Vital signs reviewed, Patient's Cardiovascular Status Stable, Respiratory Function Stable, Patent Airway and No signs of Nausea or vomiting  Post-op Vital Signs: Reviewed and stable  Complications: No complications documented.

## 2021-03-23 NOTE — H&P (Signed)
Paper H&P to be scanned into permanent record. H&P reviewed. No significant changes noted.  

## 2021-03-23 NOTE — Op Note (Addendum)
Operative Note    SURGERY DATE: 03/23/2021   PRE-OP DIAGNOSIS:  1. Left distal fibula fracture 2. Left syndesmosis disruption   POST-OP DIAGNOSIS:  1. Left distal fibula fracture 2. Left syndesmosis disruption   PROCEDURE(S): 1. ORIF Left distal fibula 2. Left ankle syndesmosis repair   SURGEON: Cato Mulligan, MD    ANESTHESIA: Regional  + Gen   ESTIMATED BLOOD LOSS: 10cc   DRAINS:  None   TOTAL IV FLUIDS: see anesthesia record  IMPLANTS: Stryker VariAx 1/3 tubular plate 4 - 4.8JE Stryker locking screws distally 1- 2.1mm Stryker lag screws  Arthrex - Titanium Syndesmosis Tightrope x 2  INDICATION(S): The patient is a 35 y.o. female who had a fall 4 days ago and noted immediate ankle pain and deformity. Radiographs in the ED the day of injury showed a high distal fibula fracture, dislocation of the tibiotalar joint, and syndesmosis disruption. Patient underwent closed reduction in the ED. Given unstable nature of the ankle injury, surgery was recommended. After discussion of risks, benefits, and alternatives to surgery, the patient elected to proceed.  Of note, the patient has a pre-existing peroneal nerve palsy from prior spinal surgery with foot drop and diminished sensation in the peroneal nerve distribution.   OPERATIVE FINDINGS: high distal fibula fracture with syndesmosis disruption; superficial abrasion to medial aspect of the ankle.   OPERATIVE REPORT:   The patient was seen in the Holding Room. The risks, benefits, complications, treatment options, and expected outcomes were discussed with the patient. The risks and potential complications of the problem and proposed treatment include but are not limited to infection, bleeding, pain, stiffness, nerve and vessel injury, hardware failure, malunion/nonunion, and complication secondary to the anesthetic. The patient concurred with the proposed plan, giving informed consent.  The site of surgery was properly noted/marked. A  peripheral nerve block was administered by the Anesthesia team.   The patient was taken to Operating Room and transferred to the operating room table. A Time Out was held and the patient identity, procedure, and laterality was confirmed. After administration of adequate anesthesia, the entire lower extremity was prescrubbed with Hibiclens and alcohol, prepped with Chloroprep, and draped in sterile fashion. The patient was given pre-operative IV antibiotics within 30 minutes of the skin incision.  Of note, the patient had an abrasion along the medial aspect of the ankle.  This was probed and noted to be superficial without communication deep to the bone itself.   The tourniquet was inflated to 257mmHg after exsanguinating the leg with an Esmarch bandage.  An incision centered at the fracture site was made along the lateral aspect of the fibula. The superficial peroneal nerve was easily identified and protected throughout the case.  Dissection was carried down sharply to the fibula. The fracture site was identified and cleared of any tissue with a combination of a knife and curette.  A reduction clamp was placed and the fracture was reduced. A 2.75mm lag screw was placed perpendicular to the fracture site. The reduction clamp was removed and the fracture remained anatomically reduced. Reduction was confirmed visually and fluoroscopically.   A Stryker straight distal Fibula plate was selected after confirming appropriate size and position fluoroscopically. The plate was contoured with plate benders as necessary. Three cortical screws were placed proximal to the fracture site. Then 2 additional cortical screws were placed distal to the fracture site.  Fluoroscopy then confirmed appropriate hardware position and reduction. External rotation stress view was positive.     Therefore, we  turned our attention to the syndesmosis repair. Under fluoroscopy, a 3.54mm drill was used to drill across both the fibula and  tibia utilizing the distal most hole of the plate.  Fluoroscopy was used to confirm appropriate position. A syndesmosis tightrope was passed through the far tibial cortex. The medial button was pulled back and flipped with confirmation with fluoroscopy. The button on the lateral side was advanced, tightening the tightrope until it was flush against the bone.  Once final tightening was achieved, we performed repeat external rotation stress test. It was improved, however, there was still some gapping. We repeated this process to place a second tightrope through the hole in the plate just proximal to the previously placed tightrope. Repeat external rotation stress view was now normal.   The wound was then thoroughly irrigated. The tourniquet was deflated with a time of 61 minutes. Hemostasis was achieved with bovie electrocautery. 0 Vicryl sutures were used to close the deep layer over the plate. 3-0 Vicryl was used to close the subdermal layer. Staples were used to close the skin. The wound was dressed with xeroform, fluffs, and cotton guaze.  The leg was then placed in a short leg splint. The patient was awakened from anesthesia without any further complication and transferred to PACU for further recovery.    POST-OPERATIVE PLAN:  Patient will be discharged to home. The patient will be NWB for 6 weeks. ASA x 4 weeks for DVT ppx. Follow up in 2 weeks as an outpatient. Transition to walking boot at that time. Start PT after 2 week appointment.

## 2021-03-23 NOTE — Anesthesia Procedure Notes (Signed)
Procedure Name: LMA Insertion Date/Time: 03/23/2021 10:43 AM Performed by: Mayme Genta, CRNA Pre-anesthesia Checklist: Patient identified, Emergency Drugs available, Suction available, Timeout performed and Patient being monitored Patient Re-evaluated:Patient Re-evaluated prior to induction Oxygen Delivery Method: Circle system utilized Preoxygenation: Pre-oxygenation with 100% oxygen Induction Type: IV induction LMA: LMA inserted LMA Size: 4.0 Number of attempts: 1 Placement Confirmation: positive ETCO2 and breath sounds checked- equal and bilateral Tube secured with: Tape

## 2021-03-23 NOTE — Anesthesia Postprocedure Evaluation (Signed)
Anesthesia Post Note  Patient: Debbie Townsend  Procedure(s) Performed: OPEN REDUCTION INTERNAL FIXATION (ORIF) Left lateral malleolus with syndesmosis repair (Left Ankle)     Patient location during evaluation: PACU Anesthesia Type: General Level of consciousness: awake and alert and oriented Pain management: satisfactory to patient Vital Signs Assessment: post-procedure vital signs reviewed and stable Respiratory status: spontaneous breathing, nonlabored ventilation and respiratory function stable Cardiovascular status: blood pressure returned to baseline and stable Postop Assessment: Adequate PO intake and No signs of nausea or vomiting Anesthetic complications: no   No complications documented.  Raliegh Ip

## 2021-03-23 NOTE — Anesthesia Preprocedure Evaluation (Addendum)
Anesthesia Evaluation  Patient identified by MRN, date of birth, ID band Patient awake    Reviewed: Allergy & Precautions, H&P , NPO status , Patient's Chart, lab work & pertinent test results  Airway Mallampati: III  TM Distance: >3 FB Neck ROM: full    Dental no notable dental hx.    Pulmonary former smoker,    Pulmonary exam normal breath sounds clear to auscultation       Cardiovascular hypertension, Normal cardiovascular exam Rhythm:regular Rate:Normal     Neuro/Psych PSYCHIATRIC DISORDERS    GI/Hepatic GERD  ,  Endo/Other    Renal/GU      Musculoskeletal   Abdominal   Peds  Hematology   Anesthesia Other Findings   Reproductive/Obstetrics                             Anesthesia Physical Anesthesia Plan  ASA: III  Anesthesia Plan: General LMA   Post-op Pain Management:  Regional for Post-op pain   Induction:   PONV Risk Score and Plan: 3 and Treatment may vary due to age or medical condition, Ondansetron, Dexamethasone and Scopolamine patch - Pre-op  Airway Management Planned:   Additional Equipment:   Intra-op Plan:   Post-operative Plan:   Informed Consent: I have reviewed the patients History and Physical, chart, labs and discussed the procedure including the risks, benefits and alternatives for the proposed anesthesia with the patient or authorized representative who has indicated his/her understanding and acceptance.     Dental Advisory Given  Plan Discussed with: CRNA  Anesthesia Plan Comments:        Anesthesia Quick Evaluation

## 2021-03-23 NOTE — Progress Notes (Signed)
Assisted Clance Boll ANMD with popliteal/saphenous block. Side rails up, monitors on throughout procedure. See vital signs in flow sheet. Tolerated Procedure well.

## 2021-03-23 NOTE — Discharge Instructions (Signed)
Information for Discharge Teaching: EXPAREL (bupivacaine liposome injectable suspension)   Your surgeon or anesthesiologist gave you EXPAREL(bupivacaine) to help control your pain after surgery.   EXPAREL is a local anesthetic that provides pain relief by numbing the tissue around the surgical site.  EXPAREL is designed to release pain medication over time and can control pain for up to 72 hours.  Depending on how you respond to EXPAREL, you may require less pain medication during your recovery.  Possible side effects:  Temporary loss of sensation or ability to move in the area where bupivacaine was injected.  Nausea, vomiting, constipation  Rarely, numbness and tingling in your mouth or lips, lightheadedness, or anxiety may occur.  Call your doctor right away if you think you may be experiencing any of these sensations, or if you have other questions regarding possible side effects.  Follow all other discharge instructions given to you by your surgeon or nurse. Eat a healthy diet and drink plenty of water or other fluids.  If you return to the hospital for any reason within 96 hours following the administration of EXPAREL, it is important for health care providers to know that you have received this anesthetic. A teal colored band has been placed on your arm with the date, time and amount of EXPAREL you have received in order to alert and inform your health care providers. Please leave this armband in place for the full 96 hours following administration, and then you may remove the band.Ankle Fracture Surgery  Post-Op Instructions  1. Bracing or crutches: Crutches will be provided at the time of discharge.  2. Splint/Cast: You will have a splint (3/4 cast) on your leg after surgery. Ensure that this remains clean and dry until follow up appointment. If this becomes wet, you need to call our offices to get it changed or else you risk skin breakdown.    3. Driving:  Plan on not  driving for at least four to six weeks. Please note that you are advised NOT to drive while taking narcotic pain medications as you may be impaired and unsafe to drive.  4. Activity: Ankle pumps several times an hour while awake to prevent blood clots. Weight bearing: Non-weight bearing. Use crutches for at least 6 weeks, if not longer based on your surgery. Bending and straightening the knee is unlimited. Elevate knee above heart level as much as possible for one week. Avoid standing more than 5 minutes (consecutively) for the first week.  Avoid long distance travel for 4 weeks.  5. Medications:  - You have been provided a prescription for narcotic pain medicine. After surgery, take 1-2 narcotic tablets every 4 hours if needed for severe pain. Please start this as soon as you begin to start having pain (if you received a nerve block, start taking as soon as this wears off).  - A prescription for anti-nausea medication will be provided in case the narcotic medicine causes nausea - take 1 tablet every 6 hours only if nauseated.  - Take enteric coated aspirin 325 mg once daily for 4 weeks to prevent blood clots.  -Take tylenol 1000 mg every 8 hours for pain.  May stop tylenol 5 days after surgery if you are having minimal pain.  If you are taking prescription medication for anxiety, depression, insomnia, muscle spasm, chronic pain, or for attention deficit disorder you are advised that you are at a higher risk of adverse effects with use of narcotics post-op, including narcotic addiction/dependence, depressed breathing, death.  If you use non-prescribed substances: alcohol, marijuana, cocaine, heroin, methamphetamines, etc., you are at a higher risk of adverse effects with use of narcotics post-op, including narcotic addiction/dependence, depressed breathing, death. You are advised that taking > 50 morphine milligram equivalents (MME) of narcotic pain medication per day results in twice the risk of  overdose or death. For your prescription provided: oxycodone 5 mg - taking more than 6 tablets per day. Be advised that we will prescribe narcotics short-term, for acute post-operative pain only - 1 week for minor operations such as knee arthroscopy for meniscus tear resection, and 3 weeks for major operations such as knee repair/reconstruction surgeries.   6. Physical Therapy: Plan to start after follow up appointment at 2 weeks. 1-2 times per week for ~12-16 weeks.   7. Work/School: May return to full work when off of crutches. May do light duty/desk job or return to school in approximately 1-2 weeks when off of narcotics, pain is well-controlled, and swelling has decreased.  8. Post-Op Appointments: Your first post-op appointment will be with Dr. Posey Pronto in approximately 2 weeks time.   If you find that they have not been scheduled please call the [facility] front desk at [phone].         General Anesthesia, Adult, Care After This sheet gives you information about how to care for yourself after your procedure. Your health care provider may also give you more specific instructions. If you have problems or questions, contact your health care provider. What can I expect after the procedure? After the procedure, the following side effects are common:  Pain or discomfort at the IV site.  Nausea.  Vomiting.  Sore throat.  Trouble concentrating.  Feeling cold or chills.  Feeling weak or tired.  Sleepiness and fatigue.  Soreness and body aches. These side effects can affect parts of the body that were not involved in surgery. Follow these instructions at home: For the time period you were told by your health care provider:  Rest.  Do not participate in activities where you could fall or become injured.  Do not drive or use machinery.  Do not drink alcohol.  Do not take sleeping pills or medicines that cause drowsiness.  Do not make important  decisions or sign legal documents.  Do not take care of children on your own.   Eating and drinking  Follow any instructions from your health care provider about eating or drinking restrictions.  When you feel hungry, start by eating small amounts of foods that are soft and easy to digest (bland), such as toast. Gradually return to your regular diet.  Drink enough fluid to keep your urine pale yellow.  If you vomit, rehydrate by drinking water, juice, or clear broth. General instructions  If you have sleep apnea, surgery and certain medicines can increase your risk for breathing problems. Follow instructions from your health care provider about wearing your sleep device: ? Anytime you are sleeping, including during daytime naps. ? While taking prescription pain medicines, sleeping medicines, or medicines that make you drowsy.  Have a responsible adult stay with you for the time you are told. It is important to have someone help care for you until you are awake and alert.  Return to your normal activities as told by your health care provider. Ask your health care provider what activities are safe for you.  Take over-the-counter and prescription medicines only as told by your health care provider.  If you smoke, do not smoke  without supervision.  Keep all follow-up visits as told by your health care provider. This is important. Contact a health care provider if:  You have nausea or vomiting that does not get better with medicine.  You cannot eat or drink without vomiting.  You have pain that does not get better with medicine.  You are unable to pass urine.  You develop a skin rash.  You have a fever.  You have redness around your IV site that gets worse. Get help right away if:  You have difficulty breathing.  You have chest pain.  You have blood in your urine or stool, or you vomit blood. Summary  After the procedure, it is common to have a sore throat or nausea. It  is also common to feel tired.  Have a responsible adult stay with you for the time you are told. It is important to have someone help care for you until you are awake and alert.  When you feel hungry, start by eating small amounts of foods that are soft and easy to digest (bland), such as toast. Gradually return to your regular diet.  Drink enough fluid to keep your urine pale yellow.  Return to your normal activities as told by your health care provider. Ask your health care provider what activities are safe for you. This information is not intended to replace advice given to you by your health care provider. Make sure you discuss any questions you have with your health care provider. Document Revised: 08/17/2020 Document Reviewed: 03/16/2020 Elsevier Patient Education  2021 [place].  Scopolamine skin patches Remove in 72 hrs. Wash hands immediately after removal. What is this medicine? SCOPOLAMINE (skoe POL a meen) is used to prevent nausea and vomiting caused by motion sickness, anesthesia and surgery. This medicine may be used for other purposes; ask your health care provider or pharmacist if you have questions. COMMON BRAND NAME(S): Transderm Scop What should I tell my health care provider before I take this medicine? They need to know if you have any of these conditions:  are scheduled to have a gastric secretion test  glaucoma  heart disease  kidney disease  liver disease  lung or breathing disease, like asthma  mental illness  prostate disease  seizures  stomach or intestine problems  trouble passing urine  an unusual or allergic reaction to scopolamine, atropine, other medicines, foods, dyes, or preservatives  pregnant or trying to get pregnant  breast-feeding How should I use this medicine? This medicine is for external use only. Follow the directions on the prescription label. Wear only 1 patch at a time. Choose an area behind the ear, that is clean,  dry, hairless and free from any cuts or irritation. Wipe the area with a clean dry tissue. Peel off the plastic backing of the skin patch, trying not to touch the adhesive side with your hands. Do not cut the patches. Firmly apply to the area you have chosen, with the metallic side of the patch to the skin and the tan-colored side showing. Once firmly in place, wash your hands well with soap and water. Do not get this medicine into your eyes. After removing the patch, wash your hands and the area behind your ear thoroughly with soap and water. The patch will still contain some medicine after use. To avoid accidental contact or ingestion by children or pets, fold the used patch in half with the sticky side together and throw away in the trash out of the  reach of children and pets. If you need to use a second patch after you remove the first, place it behind the other ear. A special MedGuide will be given to you by the pharmacist with each prescription and refill. Be sure to read this information carefully each time. Talk to your pediatrician regarding the use of this medicine in children. Special care may be needed. Overdosage: If you think you have taken too much of this medicine contact a poison control center or emergency room at once. NOTE: This medicine is only for you. Do not share this medicine with others. What if I miss a dose? This does not apply. This medicine is not for regular use. What may interact with this medicine?  alcohol  antihistamines for allergy cough and cold  atropine  certain medicines for anxiety or sleep  certain medicines for bladder problems like oxybutynin, tolterodine  certain medicines for depression like amitriptyline, fluoxetine, sertraline  certain medicines for stomach problems like dicyclomine, hyoscyamine  certain medicines for Parkinson's disease like benztropine, trihexyphenidyl  certain medicines for seizures like phenobarbital, primidone  general  anesthetics like halothane, isoflurane, methoxyflurane, propofol  ipratropium  local anesthetics like lidocaine, pramoxine, tetracaine  medicines that relax muscles for surgery  phenothiazines like chlorpromazine, mesoridazine, prochlorperazine, thioridazine  narcotic medicines for pain  other belladonna alkaloids This list may not describe all possible interactions. Give your health care provider a list of all the medicines, herbs, non-prescription drugs, or dietary supplements you use. Also tell them if you smoke, drink alcohol, or use illegal drugs. Some items may interact with your medicine. What should I watch for while using this medicine? Limit contact with water while swimming and bathing because the patch may fall off. If the patch falls off, throw it away and put a new one behind the other ear. You may get drowsy or dizzy. Do not drive, use machinery, or do anything that needs mental alertness until you know how this medicine affects you. Do not stand or sit up quickly, especially if you are an older patient. This reduces the risk of dizzy or fainting spells. Alcohol may interfere with the effect of this medicine. Avoid alcoholic drinks. Your mouth may get dry. Chewing sugarless gum or sucking hard candy, and drinking plenty of water may help. Contact your healthcare professional if the problem does not go away or is severe. This medicine may cause dry eyes and blurred vision. If you wear contact lenses, you may feel some discomfort. Lubricating drops may help. See your healthcare professional if the problem does not go away or is severe. If you are going to need surgery, an MRI, CT scan, or other procedure, tell your healthcare professional that you are using this medicine. You may need to remove the patch before the procedure. What side effects may I notice from receiving this medicine? Side effects that you should report to your doctor or health care professional as soon as  possible:  allergic reactions like skin rash, itching or hives; swelling of the face, lips, or tongue  blurred vision  changes in vision  confusion  dizziness  eye pain  fast, irregular heartbeat  hallucinations, loss of contact with reality  nausea, vomiting  pain or trouble passing urine  restlessness  seizures  skin irritation  stomach pain Side effects that usually do not require medical attention (report to your doctor or health care professional if they continue or are bothersome):  drowsiness  dry mouth  headache  sore throat  This list may not describe all possible side effects. Call your doctor for medical advice about side effects. You may report side effects to FDA at 1-800-FDA-1088. Where should I keep my medicine? Keep out of the reach of children. Store at room temperature between 20 and 25 degrees C (68 and 77 degrees F). Keep this medicine in the foil package until ready to use. Throw away any unused medicine after the expiration date. NOTE: This sheet is a summary. It may not cover all possible information. If you have questions about this medicine, talk to your doctor, pharmacist, or health care provider.  2021 Elsevier/Gold Standard (2018-02-20 16:14:46)

## 2021-03-23 NOTE — Anesthesia Procedure Notes (Signed)
Anesthesia Regional Block: Popliteal block   Pre-Anesthetic Checklist: ,, timeout performed, Correct Patient, Correct Site, Correct Laterality, Correct Procedure, Correct Position, site marked, Risks and benefits discussed,  Surgical consent,  Pre-op evaluation,  At surgeon's request and post-op pain management  Laterality: Left  Prep: chloraprep       Needles:  Injection technique: Single-shot  Needle Type: Echogenic Needle     Needle Length: 9cm  Needle Gauge: 21     Additional Needles:   Procedures:,,,, ultrasound used (permanent image in chart),,,,  Narrative:  Start time: 03/23/2021 10:08 AM End time: 03/23/2021 10:15 AM Injection made incrementally with aspirations every 5 mL.  Performed by: Personally  Anesthesiologist: Ronelle Nigh, MD  Additional Notes: Functioning IV was confirmed and monitors applied. Ultrasound guidance: relevant anatomy identified, needle position confirmed, local anesthetic spread visualized around nerve(s)., vascular puncture avoided.  Image printed for medical record.  Negative aspiration and no paresthesias; incremental administration of local anesthetic. The patient tolerated the procedure well. Vitals signes recorded in RN notes. 30 ml

## 2021-03-23 NOTE — Anesthesia Procedure Notes (Signed)
Anesthesia Regional Block: Adductor canal block   Pre-Anesthetic Checklist: ,, timeout performed, Correct Patient, Correct Site, Correct Laterality, Correct Procedure, Correct Position, site marked, Risks and benefits discussed,  Surgical consent,  Pre-op evaluation,  At surgeon's request and post-op pain management  Laterality: Left  Prep: chloraprep       Needles:  Injection technique: Single-shot  Needle Type: Echogenic Needle     Needle Length: 9cm  Needle Gauge: 21     Additional Needles:   Procedures:,,,, ultrasound used (permanent image in chart),,,,  Narrative:  Start time: 03/23/2021 10:08 AM End time: 03/23/2021 10:15 AM Injection made incrementally with aspirations every 5 mL.  Performed by: Personally  Anesthesiologist: Ronelle Nigh, MD  Additional Notes: Functioning IV was confirmed and monitors applied. Ultrasound guidance: relevant anatomy identified, needle position confirmed, local anesthetic spread visualized around nerve(s)., vascular puncture avoided.  Image printed for medical record.  Negative aspiration and no paresthesias; incremental administration of local anesthetic. The patient tolerated the procedure well. Vitals signes recorded in RN notes. 10 ml

## 2021-03-26 ENCOUNTER — Other Ambulatory Visit: Payer: Self-pay | Admitting: Internal Medicine

## 2021-03-26 ENCOUNTER — Encounter: Payer: Self-pay | Admitting: Orthopedic Surgery

## 2021-03-26 DIAGNOSIS — E559 Vitamin D deficiency, unspecified: Secondary | ICD-10-CM

## 2021-03-26 DIAGNOSIS — E538 Deficiency of other specified B group vitamins: Secondary | ICD-10-CM

## 2021-03-26 DIAGNOSIS — Z13220 Encounter for screening for lipoid disorders: Secondary | ICD-10-CM

## 2021-03-26 DIAGNOSIS — E8809 Other disorders of plasma-protein metabolism, not elsewhere classified: Secondary | ICD-10-CM

## 2021-03-26 DIAGNOSIS — E611 Iron deficiency: Secondary | ICD-10-CM

## 2021-03-26 DIAGNOSIS — Z9884 Bariatric surgery status: Secondary | ICD-10-CM

## 2021-03-26 DIAGNOSIS — R7303 Prediabetes: Secondary | ICD-10-CM

## 2021-03-27 DIAGNOSIS — G43009 Migraine without aura, not intractable, without status migrainosus: Secondary | ICD-10-CM | POA: Diagnosis not present

## 2021-03-27 DIAGNOSIS — G932 Benign intracranial hypertension: Secondary | ICD-10-CM | POA: Diagnosis not present

## 2021-04-06 DIAGNOSIS — S82832D Other fracture of upper and lower end of left fibula, subsequent encounter for closed fracture with routine healing: Secondary | ICD-10-CM | POA: Diagnosis not present

## 2021-04-11 DIAGNOSIS — S82832D Other fracture of upper and lower end of left fibula, subsequent encounter for closed fracture with routine healing: Secondary | ICD-10-CM | POA: Diagnosis not present

## 2021-04-15 DIAGNOSIS — Z9189 Other specified personal risk factors, not elsewhere classified: Secondary | ICD-10-CM

## 2021-04-15 HISTORY — DX: Other specified personal risk factors, not elsewhere classified: Z91.89

## 2021-04-19 ENCOUNTER — Encounter: Payer: Self-pay | Admitting: Internal Medicine

## 2021-04-20 ENCOUNTER — Telehealth: Payer: Self-pay

## 2021-04-20 NOTE — Telephone Encounter (Signed)
Pt calling [facility] about additional  testing outside of the [facility] testing and also its been 5 years since she was tested should she retest. The genetic counselor she spoke with last time told  Her that her  percent of getting breast cancer was higher then average. Does she retest here? She states she was sent somewhere in [place] last time.

## 2021-04-20 NOTE — Telephone Encounter (Signed)
Sch annual, as she is due.  We can discuss and if needed do labs here at that time.

## 2021-04-23 IMAGING — RF DG UGI W SINGLE CM
3 series · 4 of 4 positions shown · non-contrast
Comparison: None.

CLINICAL DATA: Pre bariatric surgery

EXAM:
UPPER GI SERIES WITH KUB
TECHNIQUE: After obtaining a scout radiograph a routine upper GI series was
performed using thin barium
FLUOROSCOPY TIME:  Fluoroscopy Time:  1 minutes 36 seconds
Radiation Exposure Index (if provided by the fluoroscopic device):
144 mGy
Number of Acquired Spot Images: 10

[Series 1: t abdomen supine · 0.15mm/px · 1 of 1 slices shown]
[im 1/1]
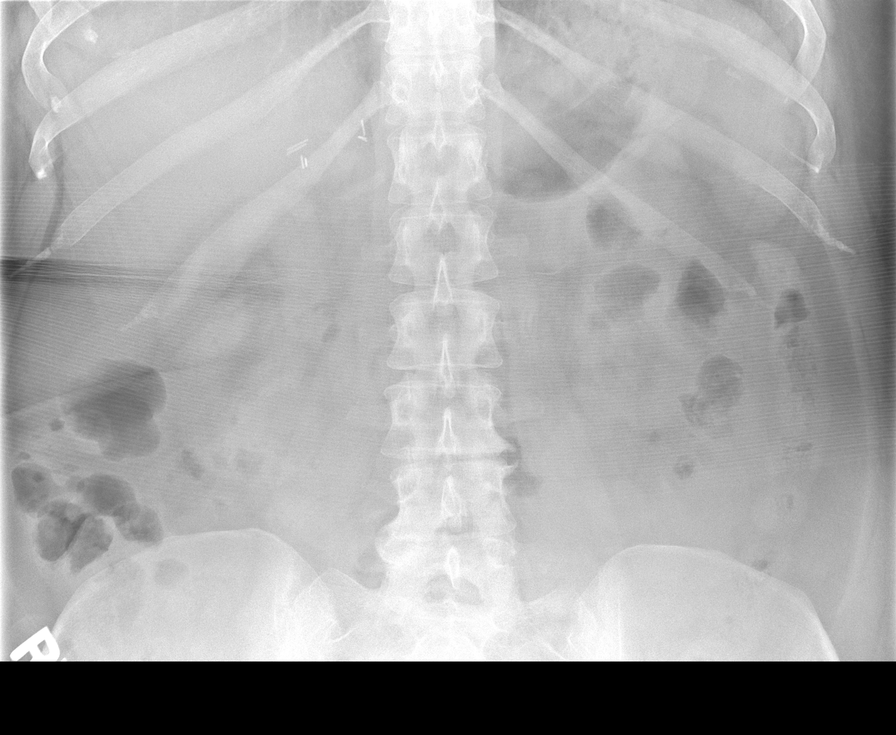

[Series 7: fluoro_barium 2fps_bw · 0.17mm/px · 1 of 1 slices shown (1 of 2)]
[im 1/1]
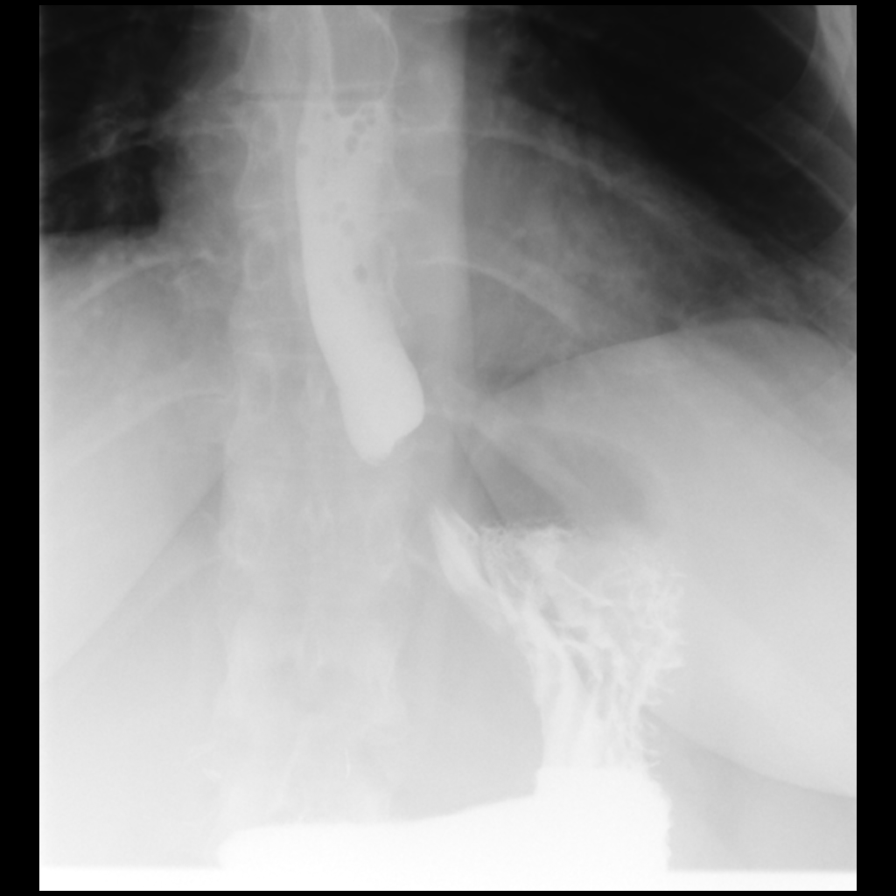

[Series 10: fluoro_barium 2fps_bw · 0.17mm/px · 2 of 2 frames shown (2 of 2)]
[frame 1/2]
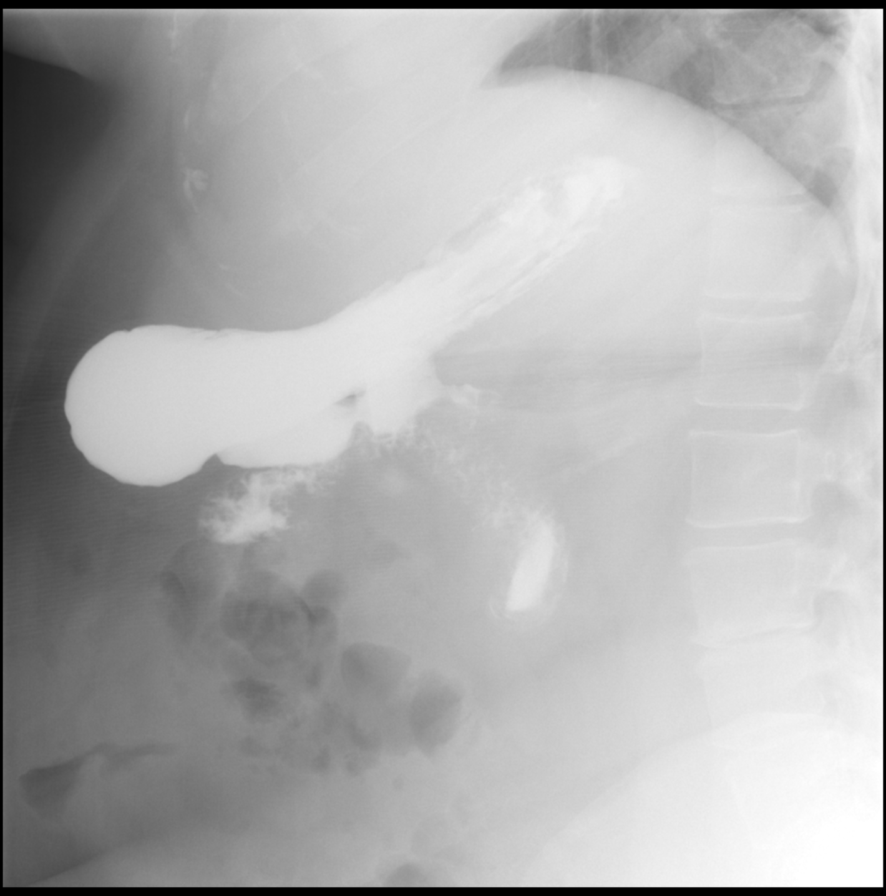
[frame 2/2]
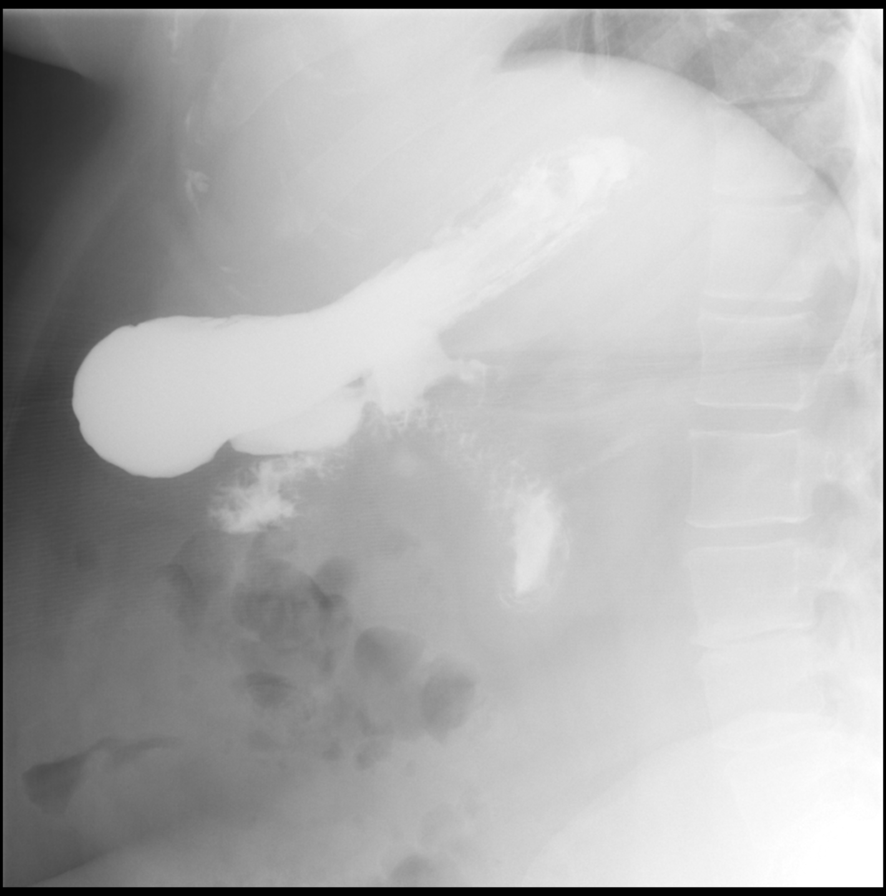

[4 of 4 positions shown; findings below may reference images not displayed]

FINDINGS: Initial KUB demonstrates cholecystectomy clips. Normal bowel-gas
pattern.

Oral contrast readily passes through the GE junction in the stomach.
No obstruction or reflux demonstrated. The stomach is normal
orientation. Contrast flows through the duodenal bulb into the
second portion of the duodenum. Normal C-loop of the duodenum.
Duodenum crosses the spine from RIGHT to LEFT with ligament Treitz
LEFT in left upper quadrant at the level the duodenal bulb.
IMPRESSION: 1. Normal anatomy of the esophagus, stomach and duodenum.
2. Ligament Treitz in expected location
3. Postcholecystectomy.

## 2021-04-23 NOTE — Telephone Encounter (Signed)
Called and left voicemail for patient to call back to be scheduled. 

## 2021-04-23 NOTE — Telephone Encounter (Signed)
Dr Kenton Kingfisher, please advise

## 2021-04-24 NOTE — Telephone Encounter (Signed)
Patient is scheduled for 5/13 for myriad draw

## 2021-04-27 ENCOUNTER — Ambulatory Visit: Payer: BC Managed Care – PPO

## 2021-04-27 ENCOUNTER — Other Ambulatory Visit: Payer: Self-pay

## 2021-04-27 ENCOUNTER — Other Ambulatory Visit (INDEPENDENT_AMBULATORY_CARE_PROVIDER_SITE_OTHER): Payer: BC Managed Care – PPO

## 2021-04-27 ENCOUNTER — Encounter: Payer: Self-pay | Admitting: Obstetrics & Gynecology

## 2021-04-27 DIAGNOSIS — E8809 Other disorders of plasma-protein metabolism, not elsewhere classified: Secondary | ICD-10-CM

## 2021-04-27 DIAGNOSIS — Z13220 Encounter for screening for lipoid disorders: Secondary | ICD-10-CM

## 2021-04-27 DIAGNOSIS — R7303 Prediabetes: Secondary | ICD-10-CM | POA: Diagnosis not present

## 2021-04-27 DIAGNOSIS — E611 Iron deficiency: Secondary | ICD-10-CM

## 2021-04-27 DIAGNOSIS — E538 Deficiency of other specified B group vitamins: Secondary | ICD-10-CM | POA: Diagnosis not present

## 2021-04-27 DIAGNOSIS — E559 Vitamin D deficiency, unspecified: Secondary | ICD-10-CM

## 2021-04-27 LAB — CBC WITH DIFFERENTIAL/PLATELET
Basophils Absolute: 0.1 10*3/uL (ref 0.0–0.1)
Basophils Relative: 1.2 % (ref 0.0–3.0)
Eosinophils Absolute: 0.1 10*3/uL (ref 0.0–0.7)
Eosinophils Relative: 3.3 % (ref 0.0–5.0)
HCT: 40.1 % (ref 36.0–46.0)
Hemoglobin: 13.5 g/dL (ref 12.0–15.0)
Lymphocytes Relative: 13.7 % (ref 12.0–46.0)
Lymphs Abs: 0.6 10*3/uL — ABNORMAL LOW (ref 0.7–4.0)
MCHC: 33.8 g/dL (ref 30.0–36.0)
MCV: 83.5 fl (ref 78.0–100.0)
Monocytes Absolute: 0.5 10*3/uL (ref 0.1–1.0)
Monocytes Relative: 12 % (ref 3.0–12.0)
Neutro Abs: 3.1 10*3/uL (ref 1.4–7.7)
Neutrophils Relative %: 69.8 % (ref 43.0–77.0)
Platelets: 212 10*3/uL (ref 150.0–400.0)
RBC: 4.8 Mil/uL (ref 3.87–5.11)
RDW: 13.6 % (ref 11.5–15.5)
WBC: 4.4 10*3/uL (ref 4.0–10.5)

## 2021-04-27 LAB — COMPREHENSIVE METABOLIC PANEL
ALT: 26 U/L (ref 0–35)
AST: 20 U/L (ref 0–37)
Albumin: 4 g/dL (ref 3.5–5.2)
Alkaline Phosphatase: 51 U/L (ref 39–117)
BUN: 10 mg/dL (ref 6–23)
CO2: 25 mEq/L (ref 19–32)
Calcium: 9 mg/dL (ref 8.4–10.5)
Chloride: 103 mEq/L (ref 96–112)
Creatinine, Ser: 0.81 mg/dL (ref 0.40–1.20)
GFR: 93.94 mL/min (ref >60.00)
Glucose, Bld: 91 mg/dL (ref 70–99)
Potassium: 4 mEq/L (ref 3.5–5.1)
Sodium: 138 mEq/L (ref 135–145)
Total Bilirubin: 0.5 mg/dL (ref 0.2–1.2)
Total Protein: 6.8 g/dL (ref 6.0–8.3)

## 2021-04-27 LAB — LIPID PANEL
Cholesterol: 129 mg/dL (ref 0–200)
HDL: 47.2 mg/dL (ref >39.00)
LDL Cholesterol: 67 mg/dL (ref 0–99)
NonHDL: 82.27
Total CHOL/HDL Ratio: 3
Triglycerides: 78 mg/dL (ref 0.0–149.0)
VLDL: 15.6 mg/dL (ref 0.0–40.0)

## 2021-04-27 LAB — VITAMIN D 25 HYDROXY (VIT D DEFICIENCY, FRACTURES): VITD: 30.4 ng/mL (ref 30.00–100.00)

## 2021-04-27 LAB — VITAMIN B12: Vitamin B-12: 138 pg/mL — ABNORMAL LOW (ref 211–911)

## 2021-04-27 LAB — HEMOGLOBIN A1C: Hgb A1c MFr Bld: 5.3 % (ref 4.6–6.5)

## 2021-04-28 LAB — IRON,TIBC AND FERRITIN PANEL
%SAT: 14 % (calc) — ABNORMAL LOW (ref 16–45)
Ferritin: 136 ng/mL (ref 16–154)
Iron: 40 ug/dL (ref 40–190)
TIBC: 289 mcg/dL (calc) (ref 250–450)

## 2021-04-29 ENCOUNTER — Encounter: Payer: Self-pay | Admitting: Internal Medicine

## 2021-04-30 ENCOUNTER — Other Ambulatory Visit: Payer: Self-pay | Admitting: Obstetrics & Gynecology

## 2021-04-30 NOTE — Telephone Encounter (Signed)
-----   Message from Delorise Jackson, MD sent at 04/29/2021 10:31 PM EDT -----  Vitamin D low normal rec D3 4000 to 5000 IU daily total over the counter B12 low  -does she want to do injection at home every 30 day or come to the clinic?  Iron low rec multivitamin with iron daily   Otherwise labs ok great improvement in A1C 5.3

## 2021-05-01 ENCOUNTER — Other Ambulatory Visit: Payer: Self-pay | Admitting: Internal Medicine

## 2021-05-01 DIAGNOSIS — F419 Anxiety disorder, unspecified: Secondary | ICD-10-CM

## 2021-05-03 ENCOUNTER — Encounter: Payer: Self-pay | Admitting: Internal Medicine

## 2021-05-03 ENCOUNTER — Ambulatory Visit (INDEPENDENT_AMBULATORY_CARE_PROVIDER_SITE_OTHER): Payer: BC Managed Care – PPO | Admitting: Internal Medicine

## 2021-05-03 ENCOUNTER — Other Ambulatory Visit: Payer: Self-pay

## 2021-05-03 ENCOUNTER — Encounter: Payer: Self-pay | Admitting: Dietician

## 2021-05-03 ENCOUNTER — Encounter: Payer: BC Managed Care – PPO | Attending: Surgery | Admitting: Dietician

## 2021-05-03 VITALS — Ht 66.0 in | Wt 233.2 lb

## 2021-05-03 VITALS — BP 120/84 | HR 74 | Temp 98.4°F | Ht 67.0 in | Wt 233.2 lb

## 2021-05-03 DIAGNOSIS — L7211 Pilar cyst: Secondary | ICD-10-CM

## 2021-05-03 DIAGNOSIS — Z6837 Body mass index (BMI) 37.0-37.9, adult: Secondary | ICD-10-CM

## 2021-05-03 DIAGNOSIS — E611 Iron deficiency: Secondary | ICD-10-CM

## 2021-05-03 DIAGNOSIS — Z803 Family history of malignant neoplasm of breast: Secondary | ICD-10-CM

## 2021-05-03 DIAGNOSIS — Z1283 Encounter for screening for malignant neoplasm of skin: Secondary | ICD-10-CM

## 2021-05-03 DIAGNOSIS — G43019 Migraine without aura, intractable, without status migrainosus: Secondary | ICD-10-CM

## 2021-05-03 DIAGNOSIS — Z1231 Encounter for screening mammogram for malignant neoplasm of breast: Secondary | ICD-10-CM

## 2021-05-03 DIAGNOSIS — N63 Unspecified lump in unspecified breast: Secondary | ICD-10-CM

## 2021-05-03 DIAGNOSIS — S82832G Other fracture of upper and lower end of left fibula, subsequent encounter for closed fracture with delayed healing: Secondary | ICD-10-CM

## 2021-05-03 DIAGNOSIS — N641 Fat necrosis of breast: Secondary | ICD-10-CM

## 2021-05-03 DIAGNOSIS — E669 Obesity, unspecified: Secondary | ICD-10-CM

## 2021-05-03 DIAGNOSIS — E538 Deficiency of other specified B group vitamins: Secondary | ICD-10-CM | POA: Diagnosis not present

## 2021-05-03 DIAGNOSIS — N644 Mastodynia: Secondary | ICD-10-CM

## 2021-05-03 MED ORDER — NEEDLE (DISP) 25G X 1-1/2" MISC
1.0000 | 1 refills | Status: DC
Start: 2021-05-03 — End: 2021-05-07

## 2021-05-03 MED ORDER — CYANOCOBALAMIN 1000 MCG/ML IJ SOLN: 1000.0000 ug | INTRAMUSCULAR | 1 refills | Status: DC

## 2021-05-03 NOTE — Progress Notes (Signed)
Chief Complaint  Patient presents with  . Follow-up   F/u  1. In wheelchair today due to closed fracture distal end of fibula f/u [facility] Dr. Posey Pronto will f/u 05/16/21 had Xray and fracture is not healing so still in a cast and non wt bearing 2. Pilar cyst to scalp increasing in size will refer to dermatology  3. Migraines controlled not currently taking aimovig  4. S/p gastric sleeve wt is trending down from >250s to now 230s reviewed labs 04/27/21 B12 def will do B12 Q30 days at home given instruction how to do this today  5. Anxiety/mood controlled on zoloft 100 mg qd   Review of Systems  Constitutional: Positive for weight loss.  HENT: Negative for hearing loss.   Eyes: Negative for blurred vision.  Respiratory: Negative for shortness of breath.   Cardiovascular: Negative for chest pain.  Gastrointestinal: Negative for abdominal pain.  Musculoskeletal: Positive for falls and joint pain.  Skin: Negative for rash.  Neurological: Negative for headaches.  Psychiatric/Behavioral: Negative for depression.   Past Medical History:  Diagnosis Date  . Anxiety   . BRCA negative   . Depression   . Family history of anesthesia complication     mother had Post -op nausea and dizziness.  . Fibula fracture    distal end f/u [facility] spring 2022 after fall   . Foot drop, left foot   . GERD (gastroesophageal reflux disease)   . Headache    migraines  . History of kidney stones   . Hypertension    better since gastric sleeve.  no meds.  . Kidney stone   . PONV (postoperative nausea and vomiting)   . Pseudotumor cerebri 2007 or 2009   Past Surgical History:  Procedure Laterality Date  . ABDOMINAL HYSTERECTOMY    . CESAREAN SECTION     2009/2017  . CHOLECYSTECTOMY N/A 02/04/2019   Procedure: LAPAROSCOPIC CHOLECYSTECTOMY;  Surgeon: Fredirick Maudlin, MD;  Location: [facility];  Service: General;  Laterality: N/A;  . LAPAROSCOPIC GASTRIC SLEEVE RESECTION N/A 11/14/2020   Procedure:  LAPAROSCOPIC GASTRIC SLEEVE RESECTION;  Surgeon: [name], MD;  Location: [facility];  Service: General;  Laterality: N/A;  . LUMBAR LAMINECTOMY/DECOMPRESSION MICRODISCECTOMY Left 09/22/2014   Procedure: Left Lumbar four-five microdiskectomy;  Surgeon: Consuella Lose, MD;  Location: [facility];  Service: Neurosurgery;  Laterality: Left;  Left Lumbar four-five microdiskectomy  . ORIF ANKLE FRACTURE Left 03/23/2021   Procedure: OPEN REDUCTION INTERNAL FIXATION (ORIF) Left lateral malleolus with syndesmosis repair;  Surgeon: Leim Fabry, MD;  Location: [facility];  Service: Orthopedics;  Laterality: Left;  . UPPER GI ENDOSCOPY N/A 11/14/2020   Procedure: UPPER GI ENDOSCOPY;  Surgeon: [name], MD;  Location: [facility];  Service: General;  Laterality: N/A;  . WISDOM TOOTH EXTRACTION     Family History  Problem Relation Age of Onset  . Cancer Mother        breast dx'ed age 50/49  . Hypertension Mother   . Diabetes Mother   . Hypothyroidism Mother   . Depression Mother   . Breast cancer Mother        28s  . Liver disease Father   . Heart attack Father   . Arthritis Father   . Alcohol abuse Father   . Depression Father   . Drug abuse Father   . Early death Father   . Heart disease Father   . Hypertension Father   . Hepatitis C Father   .  Depression Sister   . Hypertension Sister   . Arthritis Sister   . Hypertension Sister   . Cancer Maternal Grandmother        stomach>liver>brain met  . Cancer Maternal Uncle        lung not a smoker    Social History   Socioeconomic History  . Marital status: Married    Spouse name: Not on file  . Number of children: 2  . Years of education: Not on file  . Highest education level: Not on file  Occupational History  . Not on file  Tobacco Use  . Smoking status: Former Smoker    Packs/day: 0.50    Types: Cigarettes    Quit date: 03/2019    Years since quitting: 2.1  . Smokeless tobacco: Never Used  Vaping Use  .  Vaping Use: Never used  Substance and Sexual Activity  . Alcohol use: Not Currently  . Drug use: No  . Sexual activity: Yes    Birth control/protection: Rhythm  Other Topics Concern  . 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 Biotin w/ Vitamins C & E (HAIR/SKIN/NAILS PO) Take by mouth daily.  Marland Kitchen CALCIUM CITRATE PO Take by mouth.  . cyanocobalamin (,VITAMIN B-12,) 1000 MCG/ML injection Inject 1 mL (1,000 mcg total) into the muscle every 30 (thirty) days.  . cyclobenzaprine (FLEXERIL) 5 MG tablet Take 1-2 tablets (5-10 mg total) by mouth at bedtime as needed for muscle spasms.  . Multiple Vitamins-Minerals (MULTIVITAMIN WITH MINERALS) tablet Take 1 tablet by mouth daily. Bariatric advantage one a day  . NEEDLE, DISP, 25 G 25G X 1-1/2" MISC 1 Device by Does not apply route every 30 (thirty) days.  Marland Kitchen OVER THE COUNTER MEDICATION Take 1 tablet by mouth at bedtime. Tranquil Sleep  . pantoprazole (PROTONIX) 40 MG tablet TAKE 1 TABLET BY MOUTH DAILY  . Rimegepant Sulfate (NURTEC PO) Take 75 mg by mouth. Once daily prn abortive for migraines  . sertraline (ZOLOFT) 100 MG tablet TAKE 1 TABLET BY MOUTH EVERY DAY   Allergies  Allergen Reactions  . Apple Other (See Comments)    Lips itch and swell, inside of mouth gets hives  . Carrot [Daucus Carota] Other (See Comments)    Lips itch and swell, inside of mouth gets hives  . Monistat [Miconazole] Swelling    Severe vaginal itching and swelling  . Other Itching and Swelling    Almonds-mouth swells and  itches  . Almond Oil Hives  . Amoxicillin Hives, Itching, Swelling and Dermatitis    Did it involve swelling of the face/tongue/throat, SOB, or low BP? No Did it involve sudden or severe rash/hives, skin peeling, or any reaction on the inside of your mouth or nose? Yes Did you need to seek medical attention at a hospital or doctor's office? Yes When did it last happen?within the past 5 years If all above answers are "NO", may proceed  with cephalosporin use.   . Imitrex [Sumatriptan]     Felt like heart attack   . Topamax [Topiramate]     Memory loss     Recent Results (from the past 2160 hour(s))  SARS CORONAVIRUS 2 (TAT 6-24 HRS) Nasopharyngeal Nasopharyngeal Swab     Status: None   Collection Time: 03/21/21 10:41 AM   Specimen: Nasopharyngeal Swab  Result Value Ref Range   SARS Coronavirus 2 NEGATIVE NEGATIVE    Comment: (NOTE) SARS-CoV-2 target nucleic acids are NOT DETECTED.  The SARS-CoV-2 RNA is generally detectable in upper and lower respiratory specimens during the acute phase of infection. Negative results do not preclude SARS-CoV-2 infection, do not rule out co-infections with other pathogens, and should not be used as the sole basis for treatment or other patient management decisions. Negative results must be combined with clinical observations, patient history, and epidemiological information. The expected result is Negative.  Fact Sheet for Patients: SugarRoll.be  Fact Sheet for Healthcare Providers: https://www.woods-mathews.com/  This test is not yet approved or cleared by the [place] FDA and  has been authorized for detection and/or diagnosis of SARS-CoV-2 by FDA under an Emergency Use Authorization (EUA). This EUA will remain  in effect (meaning this test can be used) for the duration of the COVID-19 declaration under Se ction 564(b)(1) of the Act, 21 U.S.C. section 360bbb-3(b)(1), unless the authorization  is terminated or revoked sooner.  Performed at [facility], [place] [address], [place], [place] [address]   B12     Status: Abnormal   Collection Time: 04/27/21  8:56 AM  Result Value Ref Range   Vitamin B-12 138 (L) 211 - 911 pg/mL  Vitamin D (25 hydroxy)     Status: None   Collection Time: 04/27/21  8:56 AM  Result Value Ref Range   VITD 30.40 30.00 - 100.00 ng/mL  Iron, TIBC and Ferritin Panel     Status: Abnormal   Collection Time: 04/27/21  8:56 AM  Result Value Ref Range   Iron 40 40 - 190 mcg/dL   TIBC 289 250 - 450 mcg/dL (calc)   %SAT 14 (L) 16 - 45 % (calc)   Ferritin 136 16 - 154 ng/mL  Hemoglobin A1c     Status: None   Collection Time: 04/27/21  8:56 AM  Result Value Ref Range   Hgb A1c MFr Bld 5.3 4.6 - 6.5 %    Comment: Glycemic Control Guidelines for People with Diabetes:Non Diabetic:  <6%Goal of Therapy: <7%Additional Action Suggested:  >8%   CBC with Differential/Platelet     Status: Abnormal   Collection Time: 04/27/21  8:56 AM  Result Value Ref Range   WBC 4.4 4.0 - 10.5 K/uL   RBC 4.80 3.87 - 5.11 Mil/uL   Hemoglobin 13.5 12.0 - 15.0 g/dL   HCT 40.1 36.0 - 46.0 %   MCV 83.5 78.0 - 100.0 fl   MCHC 33.8 30.0 - 36.0 g/dL   RDW 13.6 11.5 - 15.5 %   Platelets 212.0 150.0 - 400.0 K/uL   Neutrophils Relative % 69.8 43.0 - 77.0 %   Lymphocytes Relative 13.7 12.0 - 46.0 %   Monocytes Relative 12.0 3.0 - 12.0 %   Eosinophils Relative 3.3 0.0 - 5.0 %   Basophils Relative 1.2 0.0 - 3.0 %   Neutro Abs 3.1 1.4 - 7.7 K/uL   Lymphs Abs 0.6 (L) 0.7 - 4.0 K/uL   Monocytes Absolute 0.5 0.1 - 1.0 K/uL  Eosinophils Absolute 0.1 0.0 - 0.7 K/uL   Basophils Absolute 0.1 0.0 - 0.1 K/uL  Lipid panel     Status: None   Collection Time: 04/27/21  8:56 AM  Result Value Ref Range   Cholesterol 129 0 - 200 mg/dL    Comment: ATP III Classification       Desirable:  < 200 mg/dL               Borderline High:  200 - 239 mg/dL          High:  > = 240 mg/dL   Triglycerides  78.0 0.0 - 149.0 mg/dL    Comment: Normal:  <150 mg/dLBorderline High:  150 - 199 mg/dL   HDL 47.20 >39.00 mg/dL   VLDL 15.6 0.0 - 40.0 mg/dL   LDL Cholesterol 67 0 - 99 mg/dL   Total CHOL/HDL Ratio 3     Comment:                Men          Women1/2 Average Risk     3.4          3.3Average Risk          5.0          4.42X Average Risk          9.6          7.13X Average Risk          15.0          11.0                       NonHDL 82.27     Comment: NOTE:  Non-HDL goal should be 30 mg/dL higher than patient's LDL goal (i.e. LDL goal of < 70 mg/dL, would have non-HDL goal of < 100 mg/dL)  Comprehensive metabolic panel     Status: None   Collection Time: 04/27/21  8:56 AM  Result Value Ref Range   Sodium 138 135 - 145 mEq/L   Potassium 4.0 3.5 - 5.1 mEq/L   Chloride 103 96 - 112 mEq/L   CO2 25 19 - 32 mEq/L   Glucose, Bld 91 70 - 99 mg/dL   BUN 10 6 - 23 mg/dL   Creatinine, Ser 0.81 0.40 - 1.20 mg/dL   Total Bilirubin 0.5 0.2 - 1.2 mg/dL   Alkaline Phosphatase 51 39 - 117 U/L   AST 20 0 - 37 U/L   ALT 26 0 - 35 U/L   Total Protein 6.8 6.0 - 8.3 g/dL   Albumin 4.0 3.5 - 5.2 g/dL   GFR 93.94 >60.00 mL/min    Comment: Calculated using the CKD-EPI Creatinine Equation (2021)   Calcium 9.0 8.4 - 10.5 mg/dL   Objective  Body mass index is 36.52 kg/m. Wt Readings from Last 3 Encounters:  05/03/21 233 lb 3.2 oz (105.8 kg)  05/03/21 233 lb 3.2 oz (105.8 kg)  03/23/21 240 lb (108.9 kg)   Temp Readings from Last 3 Encounters:  05/03/21 98.4 F (36.9 C) (Oral)  03/23/21 (!) 97.5 F (36.4 C)  03/19/21 98.2 F (36.8 C)   BP Readings from Last 3 Encounters:  05/03/21 120/84  03/23/21 106/63  03/19/21 132/78   Pulse Readings from Last 3 Encounters:  05/03/21 74  03/23/21 66  03/19/21 82    Physical Exam Vitals and nursing note reviewed.  Constitutional:      Appearance: Normal appearance. She  is well-developed and well-groomed. She is obese.  HENT:     Head: Normocephalic  and atraumatic.  Cardiovascular:     Rate and Rhythm: Normal rate and regular rhythm.     Heart sounds: Normal heart sounds. No murmur heard.   Pulmonary:     Effort: Pulmonary effort is normal.     Breath sounds: Normal breath sounds.  Skin:    General: Skin is warm and dry.  Neurological:     General: No focal deficit present.     Mental Status: She is alert and oriented to person, place, and time. Mental status is at baseline.     Gait: Gait abnormal.     Comments: In wheelchair today  Psychiatric:        Attention and Perception: Attention and perception normal.        Mood and Affect: Mood and affect normal.        Speech: Speech normal.        Behavior: Behavior normal. Behavior is cooperative.        Thought Content: Thought content normal.        Cognition and Memory: Cognition and memory normal.        Judgment: Judgment normal.     Assessment  Plan  Closed fracture of distal end of left fibula with delayed healing, unspecified fracture morphology, subsequent encounter F/u Dr. Posey Pronto 05/16/21    B12 deficiency - Plan: cyanocobalamin (,VITAMIN B-12,) 1000 MCG/ML injection, NEEDLE, DISP, 25 G 25G X 1-1/2" MISC Q30 days   Pilar cyst scalp Skin cancer screening Will refer to dermatology  Obesity (BMI 30-39.9) rec healthy diet and exercise  S/p gastric sleeve  No prediabetes  Cholesterol controlled  Wt trending down   Intractable migraine without aura and without status migrainosus Controlled not taking aimovig   Iron deficiency  Supplement for lifetime   HM Fasting labs11/2021 utd Declines flu shot Tdap had 01/12/16  Immune hep A/B  No hep C father had hep C from IVDU declines covid19 vaccine FH breast cancer mom 71 yearly mammogram referred due 10/2020 GIB brca negative in the past -ordered Colonoscopy age 85 HIV 01/04/16 neg   -had pap 04/23/18 neg pap neg HPV. S/p hysterectomy -ob/ygn Dr. Kenton Kingfisher  rec healthy diet and exercise  Pending my  myriad results as of 04/2021   Provider: Dr. [name] [facility]

## 2021-05-03 NOTE — Patient Instructions (Signed)
   Have a small portion, 1/4 cup, of fruit or 2-3 crackers before engaging in physical activity like maneuvering to take a bath, to prevent low blood sugar reaction.   If blood sugar does go low, eat 2-3 glucose tablets or drink 2-4oz of juice or sweet tea to get back to normal in 10-15 minutes. Then follow with a meal or snack.   Continue to eat lean proteins and low carb veggies with meals, keeping any carb portions to 1/4 - 1/2 cup and only after eating the protein food.   Follow MD directions for resuming physical activity as leg heals.

## 2021-05-03 NOTE — Progress Notes (Signed)
Nutrition Therapy for Post-Operative Bariatric Diet Follow-up visit:  6 months post-op sleeve gastrectomy Surgery  Medical Nutrition Therapy:  Appt start time: 1700 end time:  0865.  Anthropometrics: Weight: 233lbs Height: 5'7"   Date 10/17/20 11/24/20 01/11/21 05/03/21  BMI 45.9 43.7 40.7 37.64  Weight (lbs) 284.5 270.6 252.2 233.2  Skeletal muscle (lbs) 77.4 73 69.4 ---  % body fat 51.5 51.9 50.4 ---   Unable to use body composition scale due to leg cast   Clinical: Medications: Aimovig, pantoprazole, rimegepant sulfate, sertraline Supplementation: pre-natal vitamins 2x daily, calcium 2x daily, hair/ skin/ nails supplement, vitamin B12 monthly injections, vitamin D Health/ medical history changes: fibula fracture 03/2021 with surgery, ligament damage GI symptoms: N/V/D/C: constipation; taking stool softener including senokot, with some improvement but not resolved Dumping Syndrome: no Hair loss: yes; taking vitamin for hair and nails  Dietary/ Lifestyle Progress: . Patient continues to eat meals with lean proteins and low carb vegetables, with very limited carb intake. She is eating some pinto beans and sweet potato, but no other starches.   She reports some episodes of hypoglycemic symptoms after bathing; she ate a spoonful of peanut butter and felt better soon after.  She has not been able to take bariatric multivitamins, so is currently taking 2 prenatal vitamins daily.   Recent B12 and D levels have tested low, patient is supplementing with additional B12 injections and oral vitamin D.   Dietary recall: Eating pattern: 3 meals and 2-3 snacks daily Dining out: 0 meals per week Breakfast: egg + [place] sausage (doesn't finish) Snack: protein shake or bar; yogurt; few cheese cubes with [place] pepperoni Lunch: lettuce wrap; meat + veg leftovers; low carb tortilla with protein on it; deli meat Snack: same as am or cucumber/ grapes  Dinner: chicken/ shrimp/ salmon/ lean beef + low  carb veg + sweet potato/ pinto beans Snack: occasionally about 2oz tea  Fluid intake: 32-48 + 11 = 50oz average Estimated total protein intake: 60-65g Bariatric diet adherence:  . Using straws: occasionally, no side effects  . Drinking fluids during meals: no . Carbonated beverages: no  Recent physical activity:  No structured activity due to leg fracture   Nutrition Intervention:   . Reviewed progress since previous visit on 01/11/21. . Patient continues to adhere to bariatric diet guidelines, and continue with steady weight loss despite occurrence of leg fracture and surgery.  . Discussed appropriate food portions and choices  . Instructed on treatment for hypoglycemia and strategies to prevent.   Nutritional Diagnosis:  [place]-3.3 Overweight/obesity As related to history of excess calories and inadequate physical activity.  As evidenced by patient with current BMI of 37.6, following bariatric diet after weight loss surgery.  Teaching Method Utilized:  Visual Auditory Hands on  Materials provided:  [phone] calorie bariatric menus (AND)  ASMBS guidelines for vitamin and mineral supplementation   Learning Readiness:   Change in progress  Barriers to learning/adherence to lifestyle change: none  Demonstrated degree of understanding via:  Teach Back      Plan: . Follow goals as listed in patient instructions. . Return for 9 month post-op MNT, to be scheduled.

## 2021-05-03 NOTE — Patient Instructions (Addendum)
giovannis tea tree shampoo /condition  Vital proteins collagen  biosil  Rosemary  zenagen  nioxin  Vitamin D 2000 IU daily  B12 1000 x 1 x per month Iron ferrous sulface 325 mg daily to 2x per day  ? How much iron is in the prenatal and let me know   Vitamin B12 Deficiency Vitamin B12 deficiency occurs when the body does not have enough vitamin B12, which is an important vitamin. The body needs this vitamin:  To make red blood cells.  To make DNA. This is the genetic material inside cells.  To help the nerves work properly so they can carry messages from the brain to the body. Vitamin B12 deficiency can cause various health problems, such as a low red blood cell count (anemia) or nerve damage. What are the causes? This condition may be caused by:  Not eating enough foods that contain vitamin B12.  Not having enough stomach acid and digestive fluids to properly absorb vitamin B12 from the food that you eat.  Certain digestive system diseases that make it hard to absorb vitamin B12. These diseases include Crohn's disease, chronic pancreatitis, and cystic fibrosis.  A condition in which the body does not make enough of a protein (intrinsic factor), resulting in too few red blood cells (pernicious anemia).  Having a surgery in which part of the stomach or small intestine is removed.  Taking certain medicines that make it hard for the body to absorb vitamin B12. These medicines include: ? Heartburn medicines (antacids and proton pump inhibitors). ? Certain antibiotic medicines. ? Some medicines that are used to treat diabetes, tuberculosis, gout, or high cholesterol. What increases the risk? The following factors may make you more likely to develop a B12 deficiency:  Being older than age 2.  Eating a vegetarian or vegan diet, especially while you are pregnant.  Eating a poor diet while you are pregnant.  Taking certain medicines.  Having alcoholism. What are the signs  or symptoms? In some cases, there are no symptoms of this condition. If the condition leads to anemia or nerve damage, various symptoms can occur, such as:  Weakness.  Fatigue.  Loss of appetite.  Weight loss.  Numbness or tingling in your hands and feet.  Redness and burning of the tongue.  Confusion or memory problems.  Depression.  Sensory problems, such as color blindness, ringing in the ears, or loss of taste.  Diarrhea or constipation.  Trouble walking. If anemia is severe, symptoms can include:  Shortness of breath.  Dizziness.  Rapid heart rate (tachycardia). How is this diagnosed? This condition may be diagnosed with a blood test to measure the level of vitamin B12 in your blood. You may also have other tests, including:  A group of tests that measure certain characteristics of blood cells (complete blood count, CBC).  A blood test to measure intrinsic factor.  A procedure where a thin tube with a camera on the end is used to look into your stomach or intestines (endoscopy). Other tests may be needed to discover the cause of B12 deficiency. How is this treated? Treatment for this condition depends on the cause. This condition may be treated by:  Changing your eating and drinking habits, such as: ? Eating more foods that contain vitamin B12. ? Drinking less alcohol or no alcohol.  Getting vitamin B12 injections.  Taking vitamin B12 supplements. Your health care provider will tell you which dosage is best for you. Follow these instructions at home: Eating  and drinking  Eat lots of healthy foods that contain vitamin B12, including: ? Meats and poultry. This includes beef, pork, chicken, [place], and organ meats, such as liver. ? Seafood. This includes clams, rainbow trout, salmon, tuna, and haddock. ? Eggs. ? Cereal and dairy products that are fortified. This means that vitamin B12 has been added to the food. Check the label on the package to see if the  food is fortified. The items listed above may not be a complete list of recommended foods and beverages. Contact a dietitian for more information.   General instructions  Get any injections that are prescribed by your health care provider.  Take supplements only as told by your health care provider. Follow the directions carefully.  Do not drink alcohol if your health care provider tells you not to. In some cases, you may only be asked to limit alcohol use.  Keep all follow-up visits as told by your health care provider. This is important. Contact a health care provider if:  Your symptoms come back. Get help right away if you:  Develop shortness of breath.  Have a rapid heart rate.  Have chest pain.  Become dizzy or lose consciousness. Summary  Vitamin B12 deficiency occurs when the body does not have enough vitamin B12.  The main causes of vitamin B12 deficiency include dietary deficiency, digestive diseases, pernicious anemia, and having a surgery in which part of the stomach or small intestine is removed.  In some cases, there are no symptoms of this condition. If the condition leads to anemia or nerve damage, various symptoms can occur, such as weakness, shortness of breath, and numbness.  Treatment may include getting vitamin B12 injections or taking vitamin B12 supplements. Eat lots of healthy foods that contain vitamin B12. This information is not intended to replace advice given to you by your health care provider. Make sure you discuss any questions you have with your health care provider. Document Revised: 05/21/2019 Document Reviewed: 08/11/2018 Elsevier Patient Education  2021 [place].  Cyanocobalamin, Vitamin B12 injection What is this medicine? CYANOCOBALAMIN (sye an oh koe BAL a min) is a man made form of vitamin B12. Vitamin B12 is used in the growth of healthy blood cells, nerve cells, and proteins in the body. It also helps with the metabolism of fats and  carbohydrates. This medicine is used to treat people who can not absorb vitamin B12. This medicine may be used for other purposes; ask your health care provider or pharmacist if you have questions. COMMON BRAND NAME(S): B-12 Compliance Kit, B-12 Injection Kit, Cyomin, LA-12, Nutri-Twelve, Physicians EZ Use B-12, Primabalt What should I tell my health care provider before I take this medicine? They need to know if you have any of these conditions:  kidney disease  Leber's disease  megaloblastic anemia  an unusual or allergic reaction to cyanocobalamin, cobalt, other medicines, foods, dyes, or preservatives  pregnant or trying to get pregnant  breast-feeding How should I use this medicine? This medicine is injected into a muscle or deeply under the skin. It is usually given by a health care professional in a clinic or doctor's office. However, your doctor may teach you how to inject yourself. Follow all instructions. Talk to your pediatrician regarding the use of this medicine in children. Special care may be needed. Overdosage: If you think you have taken too much of this medicine contact a poison control center or emergency room at once. NOTE: This medicine is only for you. Do  not share this medicine with others. What if I miss a dose? If you are given your dose at a clinic or doctor's office, call to reschedule your appointment. If you give your own injections and you miss a dose, take it as soon as you can. If it is almost time for your next dose, take only that dose. Do not take double or extra doses. What may interact with this medicine?  colchicine  heavy alcohol intake This list may not describe all possible interactions. Give your health care provider a list of all the medicines, herbs, non-prescription drugs, or dietary supplements you use. Also tell them if you smoke, drink alcohol, or use illegal drugs. Some items may interact with your medicine. What should I watch for while  using this medicine? Visit your doctor or health care professional regularly. You may need blood work done while you are taking this medicine. You may need to follow a special diet. Talk to your doctor. Limit your alcohol intake and avoid smoking to get the best benefit. What side effects may I notice from receiving this medicine? Side effects that you should report to your doctor or health care professional as soon as possible:  allergic reactions like skin rash, itching or hives, swelling of the face, lips, or tongue  blue tint to skin  chest tightness, pain  difficulty breathing, wheezing  dizziness  red, swollen painful area on the leg Side effects that usually do not require medical attention (report to your doctor or health care professional if they continue or are bothersome):  diarrhea  headache This list may not describe all possible side effects. Call your doctor for medical advice about side effects. You may report side effects to FDA at 1-800-FDA-1088. Where should I keep my medicine? Keep out of the reach of children. Store at room temperature between 15 and 30 degrees C (59 and 85 degrees F). Protect from light. Throw away any unused medicine after the expiration date. NOTE: This sheet is a summary. It may not cover all possible information. If you have questions about this medicine, talk to your doctor, pharmacist, or health care provider.  2021 Elsevier/Gold Standard (2008-03-14 22:10:20)

## 2021-05-04 ENCOUNTER — Encounter: Payer: Self-pay | Admitting: Internal Medicine

## 2021-05-04 NOTE — Telephone Encounter (Signed)
Please advise, does Patient need to come back in or a referral?

## 2021-05-07 ENCOUNTER — Ambulatory Visit (INDEPENDENT_AMBULATORY_CARE_PROVIDER_SITE_OTHER): Payer: BC Managed Care – PPO

## 2021-05-07 ENCOUNTER — Other Ambulatory Visit: Payer: Self-pay

## 2021-05-07 ENCOUNTER — Other Ambulatory Visit: Payer: Self-pay | Admitting: Internal Medicine

## 2021-05-07 ENCOUNTER — Encounter: Payer: Self-pay | Admitting: Internal Medicine

## 2021-05-07 DIAGNOSIS — E669 Obesity, unspecified: Secondary | ICD-10-CM | POA: Insufficient documentation

## 2021-05-07 DIAGNOSIS — E538 Deficiency of other specified B group vitamins: Secondary | ICD-10-CM | POA: Diagnosis not present

## 2021-05-07 MED ORDER — NEEDLE (DISP) 25G X 1-1/2" MISC
1.0000 | 1 refills | Status: DC
Start: 2021-05-07 — End: 2021-06-20

## 2021-05-07 MED ORDER — CYANOCOBALAMIN 1000 MCG/ML IJ SOLN
1000.0000 ug | Freq: Once | INTRAMUSCULAR | Status: AC
  Administered 2021-05-07: 1000 ug via INTRAMUSCULAR

## 2021-05-07 NOTE — Progress Notes (Signed)
Patient presented for B 12 injection to right thigh, patient voiced no concerns nor showed any signs of distress during injection. °

## 2021-05-08 ENCOUNTER — Ambulatory Visit: Payer: BC Managed Care – PPO

## 2021-05-10 NOTE — Telephone Encounter (Signed)
Patient calling about Myriad results drawn 04/27/21. HW#[phone]

## 2021-05-15 ENCOUNTER — Encounter: Payer: Self-pay | Admitting: Obstetrics and Gynecology

## 2021-05-15 ENCOUNTER — Other Ambulatory Visit: Payer: Self-pay | Admitting: Obstetrics and Gynecology

## 2021-05-15 DIAGNOSIS — Z803 Family history of malignant neoplasm of breast: Secondary | ICD-10-CM

## 2021-05-15 DIAGNOSIS — S82832D Other fracture of upper and lower end of left fibula, subsequent encounter for closed fracture with routine healing: Secondary | ICD-10-CM | POA: Diagnosis not present

## 2021-05-15 NOTE — Progress Notes (Signed)
MyRisk order from 5/22 with [facility]

## 2021-05-16 DIAGNOSIS — M25572 Pain in left ankle and joints of left foot: Secondary | ICD-10-CM | POA: Diagnosis not present

## 2021-05-16 DIAGNOSIS — R2689 Other abnormalities of gait and mobility: Secondary | ICD-10-CM | POA: Diagnosis not present

## 2021-05-18 DIAGNOSIS — R2689 Other abnormalities of gait and mobility: Secondary | ICD-10-CM | POA: Diagnosis not present

## 2021-05-18 DIAGNOSIS — M25572 Pain in left ankle and joints of left foot: Secondary | ICD-10-CM | POA: Diagnosis not present

## 2021-05-21 ENCOUNTER — Other Ambulatory Visit: Payer: Self-pay | Admitting: Obstetrics & Gynecology

## 2021-05-21 ENCOUNTER — Telehealth: Payer: Self-pay

## 2021-05-21 NOTE — Telephone Encounter (Signed)
-----   Message from Gae Dry, MD sent at 05/21/2021  2:49 PM EDT ----- Regarding: appt Sch Annual (and can discuss lab work then) appt as able

## 2021-05-21 NOTE — Telephone Encounter (Signed)
Called and left voicemail for patient to call back to be scheduled. 

## 2021-05-22 NOTE — Telephone Encounter (Signed)
Can this be a phone appointment?

## 2021-05-23 ENCOUNTER — Telehealth: Payer: Self-pay | Admitting: Dietician

## 2021-05-23 NOTE — Telephone Encounter (Signed)
Called patient to schedule her 9 month post-op MNT visit; scheduled for 08/08/21 at 5:00pm.

## 2021-05-25 ENCOUNTER — Telehealth: Payer: BC Managed Care – PPO | Admitting: Family Medicine

## 2021-05-25 DIAGNOSIS — R2689 Other abnormalities of gait and mobility: Secondary | ICD-10-CM | POA: Diagnosis not present

## 2021-05-25 DIAGNOSIS — M25572 Pain in left ankle and joints of left foot: Secondary | ICD-10-CM | POA: Diagnosis not present

## 2021-05-28 DIAGNOSIS — M25572 Pain in left ankle and joints of left foot: Secondary | ICD-10-CM | POA: Diagnosis not present

## 2021-05-28 DIAGNOSIS — R2689 Other abnormalities of gait and mobility: Secondary | ICD-10-CM | POA: Diagnosis not present

## 2021-05-29 ENCOUNTER — Other Ambulatory Visit: Payer: Self-pay | Admitting: Obstetrics & Gynecology

## 2021-05-29 NOTE — Progress Notes (Signed)
Discussed MyRisk results (by phone)  Follow up discussion regarding recent genetic testing due to familial high risk factors.  Her lab genetic panel was negative.  Reassurance provided and counseling as to the pros and cons of testing was done today.  Since the current test is not perfect, it is possible that there may be a gene mutation that current testing cannot detect, but that chance is small. It is possible that a different genetic factor, which has not yet been discovered or is not on this panel, is responsible for the cancer diagnoses in the family. Again, the likelihood of this is low.   Most cancers happen by chance and this test, along with details of her family history, suggests that her cancer falls into this category. She is recommended to follow the cancer screening guidelines provided by her physician.  Breast cancer risk is further calculated based on the T-C model.  Even with negative gene testing, she still has a risk of 36% based on this calculation.  Recommendations are for yearly breast exams, mammograms, and MRI when this risk is > 20%.    Discussed timing of MMG and MRI, will plan this Dec. Barnett Applebaum, MD, [facility], [facility] 05/29/2021  1:24 PM

## 2021-05-31 DIAGNOSIS — M25572 Pain in left ankle and joints of left foot: Secondary | ICD-10-CM | POA: Diagnosis not present

## 2021-05-31 DIAGNOSIS — R2689 Other abnormalities of gait and mobility: Secondary | ICD-10-CM | POA: Diagnosis not present

## 2021-06-05 DIAGNOSIS — M25572 Pain in left ankle and joints of left foot: Secondary | ICD-10-CM | POA: Diagnosis not present

## 2021-06-05 DIAGNOSIS — R2689 Other abnormalities of gait and mobility: Secondary | ICD-10-CM | POA: Diagnosis not present

## 2021-06-07 ENCOUNTER — Encounter: Payer: Self-pay | Admitting: Emergency Medicine

## 2021-06-07 ENCOUNTER — Emergency Department: Payer: BC Managed Care – PPO

## 2021-06-07 ENCOUNTER — Other Ambulatory Visit: Payer: Self-pay

## 2021-06-07 ENCOUNTER — Emergency Department
Admission: EM | Admit: 2021-06-07 | Discharge: 2021-06-07 | Disposition: A | Discharge status: Home / Self Care | Payer: BC Managed Care – PPO | Attending: Emergency Medicine | Admitting: Emergency Medicine

## 2021-06-07 DIAGNOSIS — R7309 Other abnormal glucose: Secondary | ICD-10-CM | POA: Diagnosis not present

## 2021-06-07 DIAGNOSIS — Z87891 Personal history of nicotine dependence: Secondary | ICD-10-CM | POA: Insufficient documentation

## 2021-06-07 DIAGNOSIS — R413 Other amnesia: Secondary | ICD-10-CM | POA: Diagnosis not present

## 2021-06-07 DIAGNOSIS — I10 Essential (primary) hypertension: Secondary | ICD-10-CM | POA: Insufficient documentation

## 2021-06-07 DIAGNOSIS — R4182 Altered mental status, unspecified: Secondary | ICD-10-CM | POA: Diagnosis not present

## 2021-06-07 DIAGNOSIS — R519 Headache, unspecified: Secondary | ICD-10-CM | POA: Diagnosis not present

## 2021-06-07 DIAGNOSIS — G43109 Migraine with aura, not intractable, without status migrainosus: Secondary | ICD-10-CM | POA: Diagnosis not present

## 2021-06-07 DIAGNOSIS — G43101 Migraine with aura, not intractable, with status migrainosus: Secondary | ICD-10-CM | POA: Insufficient documentation

## 2021-06-07 LAB — BASIC METABOLIC PANEL
Anion gap: 7 (ref 5–15)
BUN: 12 mg/dL (ref 6–20)
CO2: 25 mmol/L (ref 22–32)
Calcium: 8.8 mg/dL — ABNORMAL LOW (ref 8.9–10.3)
Chloride: 106 mmol/L (ref 98–111)
Creatinine, Ser: 0.86 mg/dL (ref 0.44–1.00)
GFR, Estimated: >60 mL/min (ref >60)
Glucose, Bld: 89 mg/dL (ref 70–99)
Potassium: 4.3 mmol/L (ref 3.5–5.1)
Sodium: 138 mmol/L (ref 135–145)

## 2021-06-07 LAB — CBC
HCT: 41 % (ref 36.0–46.0)
Hemoglobin: 13.6 g/dL (ref 12.0–15.0)
MCH: 27.9 pg (ref 26.0–34.0)
MCHC: 33.2 g/dL (ref 30.0–36.0)
MCV: 84 fL (ref 80.0–100.0)
Platelets: 285 10*3/uL (ref 150–400)
RBC: 4.88 MIL/uL (ref 3.87–5.11)
RDW: 12.6 % (ref 11.5–15.5)
WBC: 10.3 10*3/uL (ref 4.0–10.5)
nRBC: 0 % (ref 0.0–0.2)

## 2021-06-07 LAB — URINALYSIS, COMPLETE (UACMP) WITH MICROSCOPIC
Bilirubin Urine: NEGATIVE
Glucose, UA: NEGATIVE mg/dL
Hgb urine dipstick: NEGATIVE
Ketones, ur: NEGATIVE mg/dL
Leukocytes,Ua: NEGATIVE
Nitrite: NEGATIVE
Protein, ur: NEGATIVE mg/dL
Specific Gravity, Urine: 1.012 (ref 1.005–1.030)
pH: 6 (ref 5.0–8.0)

## 2021-06-07 LAB — CBG MONITORING, ED: Glucose-Capillary: 84 mg/dL (ref 70–99)

## 2021-06-07 LAB — TROPONIN I (HIGH SENSITIVITY): Troponin I (High Sensitivity): 2 ng/L (ref <18)

## 2021-06-07 MED ORDER — KETOROLAC TROMETHAMINE 30 MG/ML IJ SOLN
30.0000 mg | Freq: Once | INTRAMUSCULAR | Status: AC
Start: 2021-06-07 — End: 2021-06-07
  Administered 2021-06-07: 30 mg via INTRAVENOUS
  Filled 2021-06-07: qty 1

## 2021-06-07 MED ORDER — DEXAMETHASONE SODIUM PHOSPHATE 10 MG/ML IJ SOLN
10.0000 mg | Freq: Once | INTRAMUSCULAR | Status: AC
  Administered 2021-06-07: 10 mg via INTRAVENOUS
  Filled 2021-06-07: qty 1

## 2021-06-07 MED ORDER — SODIUM CHLORIDE 0.9 % IV BOLUS
1000.0000 mL | Freq: Once | INTRAVENOUS | Status: AC
  Administered 2021-06-07: 1000 mL via INTRAVENOUS

## 2021-06-07 MED ORDER — DIPHENHYDRAMINE HCL 50 MG/ML IJ SOLN
50.0000 mg | Freq: Once | INTRAMUSCULAR | Status: AC
  Administered 2021-06-07: 50 mg via INTRAVENOUS
  Filled 2021-06-07: qty 1

## 2021-06-07 MED ORDER — METOCLOPRAMIDE HCL 5 MG/ML IJ SOLN
20.0000 mg | Freq: Once | INTRAVENOUS | Status: AC
  Administered 2021-06-07: 20 mg via INTRAVENOUS
  Filled 2021-06-07: qty 4

## 2021-06-07 NOTE — ED Provider Notes (Signed)
[facility] Emergency Department Provider Note   ____________________________________________   Event Date/Time   First MD Initiated Contact with Patient 06/07/21 2048     (approximate)  I have reviewed the triage vital signs and the nursing notes.   HISTORY  Chief Complaint Migraine and Altered Mental Status    HPI Debbie Townsend is a 35 y.o. female with a stated past medical history as described below and significant for migraines who presents for a global, 10/10, nonradiating headache that she states is associated with "memory loss".  Husband at bedside states that patient has been having 5-10-minute episodes of "memory loss" where she cannot remember any of the events of the day.  Patient currently denies any vision changes, tinnitus, difficulty speaking, facial droop, sore throat, chest pain, shortness of breath, abdominal pain, nausea/vomiting/diarrhea, dysuria, or weakness/numbness/paresthesias in any extremity         Past Medical History:  Diagnosis Date   Anxiety    BRCA negative    MyRisk update neg 5/22   Depression    Family history of anesthesia complication     mother had Post -op nausea and dizziness.   Family history of breast cancer    Fibula fracture    distal end f/u [facility] spring 2022 after fall    Foot drop, left foot    GERD (gastroesophageal reflux disease)    Headache    migraines   History of kidney stones    Hypertension    better since gastric sleeve.  no meds.   Increased risk of breast cancer 04/2021   IBIS=20.3%/riskscore=35.6%   Kidney stone    PONV (postoperative nausea and vomiting)    Pseudotumor cerebri 2007 or 2009    Patient Active Problem List   Diagnosis Date Noted   Obesity (BMI 30-39.9) 05/07/2021   Iron deficiency 11/03/2020   Hair loss 07/03/2020   Prediabetes 07/03/2020   Depression, recurrent ([place]) 07/03/2020   Intractable migraine without aura and without status migrainosus  04/04/2020   Gastroesophageal reflux disease without esophagitis 03/29/2020   Chronic midline low back pain 02/13/2020   CSF leak from nose 12/07/2019   Other optic atrophy, bilateral 12/07/2019   IIH (idiopathic intracranial hypertension) 12/07/2019   Annual physical exam 09/24/2019   Insomnia 09/24/2019   Fatigue 09/24/2019   History of kidney stones 09/24/2019   Allergic rhinitis 03/24/2019   Status post laparoscopic hysterectomy 02/12/2019   Adenomyosis 02/04/2019   DUB (dysfunctional uterine bleeding) 09/10/2018   Fatty liver 04/02/2018   Gallstones 04/02/2018   Heavy menses 04/02/2018   FH: breast cancer in first degree relative 04/02/2018   Anxiety 04/02/2018   Anxiety and depression 12/16/2016   Cesarean delivery delivered 03/22/2016   Rubella non-immune status, antepartum 01/08/2016   Essential hypertension 11/13/2015   Pseudotumor cerebri 11/13/2015   HNP (herniated nucleus pulposus), lumbar 09/22/2014    Past Surgical History:  Procedure Laterality Date   ABDOMINAL HYSTERECTOMY     CESAREAN SECTION     2009/2017   CHOLECYSTECTOMY N/A 02/04/2019   Procedure: LAPAROSCOPIC CHOLECYSTECTOMY;  Surgeon: Fredirick Maudlin, MD;  Location: [facility] ORS;  Service: General;  Laterality: N/A;   LAPAROSCOPIC GASTRIC SLEEVE RESECTION N/A 11/14/2020   Procedure: LAPAROSCOPIC GASTRIC SLEEVE RESECTION;  Surgeon: [name], MD;  Location: [facility] ORS;  Service: General;  Laterality: N/A;   LUMBAR LAMINECTOMY/DECOMPRESSION MICRODISCECTOMY Left 09/22/2014   Procedure: Left Lumbar four-five microdiskectomy;  Surgeon: Consuella Lose, MD;  Location: [facility];  Service:  Neurosurgery;  Laterality: Left;  Left Lumbar four-five microdiskectomy   ORIF ANKLE FRACTURE Left 03/23/2021   Procedure: OPEN REDUCTION INTERNAL FIXATION (ORIF) Left lateral malleolus with syndesmosis repair;  Surgeon: Leim Fabry, MD;  Location: [place];  Service: Orthopedics;  Laterality: Left;   UPPER GI  ENDOSCOPY N/A 11/14/2020   Procedure: UPPER GI ENDOSCOPY;  Surgeon: [name], MD;  Location: [facility];  Service: General;  Laterality: N/A;   WISDOM TOOTH EXTRACTION      Prior to Admission medications   Medication Sig Start Date End Date Taking? Authorizing Provider  AIMOVIG 140 MG/ML SOAJ Take 140 mg by mouth every 30 (thirty) days.  Patient not taking: Reported on 05/03/2021 06/26/20   [provider]  Biotin w/ Vitamins C & E (HAIR/SKIN/NAILS PO) Take by mouth daily.    [provider]  CALCIUM CITRATE PO Take by mouth.    [provider]  cyanocobalamin (,VITAMIN B-12,) 1000 MCG/ML injection Inject 1 mL (1,000 mcg total) into the muscle every 30 (thirty) days. 05/03/21   McLean-Scocuzza, Nino Glow, MD  cyclobenzaprine (FLEXERIL) 5 MG tablet Take 1-2 tablets (5-10 mg total) by mouth at bedtime as needed for muscle spasms. 02/09/21   McLean-Scocuzza, Nino Glow, MD  Multiple Vitamins-Minerals (MULTIVITAMIN WITH MINERALS) tablet Take 1 tablet by mouth daily. Bariatric advantage one a day    [provider]  NEEDLE, DISP, 25 G 25G X 1-1/2" MISC 1 Device by Does not apply route every 30 (thirty) days. 05/07/21   McLean-Scocuzza, Nino Glow, MD  OVER THE COUNTER MEDICATION Take 1 tablet by mouth at bedtime. Tranquil Sleep    [provider]  pantoprazole (PROTONIX) 40 MG tablet TAKE 1 TABLET BY MOUTH DAILY 11/15/20 11/15/21  [name], MD  Rimegepant Sulfate (NURTEC PO) Take 75 mg by mouth. Once daily prn abortive for migraines    [provider]  sertraline (ZOLOFT) 100 MG tablet TAKE 1 TABLET BY MOUTH EVERY DAY 05/01/21   McLean-Scocuzza, Nino Glow, MD  gabapentin (NEURONTIN) 100 MG capsule Take 2 capsules (200 mg total) by mouth every 12 (twelve) hours. 11/15/20 11/24/20  [name], MD    Allergies Apple, Carrot [daucus carota], Monistat [miconazole], Other, Almond oil, Amoxicillin, Imitrex [sumatriptan], and Topamax  [topiramate]  Family History  Problem Relation Age of Onset   Cancer Mother        breast dx'ed age 19/49   Hypertension Mother    Diabetes Mother    Hypothyroidism Mother    Depression Mother    Breast cancer Mother        66s   Liver disease Father    Heart attack Father    Arthritis Father    Alcohol abuse Father    Depression Father    Drug abuse Father    Early death Father    Heart disease Father    Hypertension Father    Hepatitis C Father    Depression Sister    Hypertension Sister    Arthritis Sister    Hypertension Sister    Cancer Maternal Grandmother        stomach>liver>brain met   Cancer Maternal Uncle        lung not a smoker     Social History Social History   Tobacco Use   Smoking status: Former    Packs/day: 0.50    Pack years: 0.00    Types: Cigarettes    Quit date: 03/2019    Years since  quitting: 2.2   Smokeless tobacco: Never  Vaping Use   Vaping Use: Never used  Substance Use Topics   Alcohol use: Not Currently   Drug use: No    Review of Systems Constitutional: No fever/chills Eyes: No visual changes. ENT: No sore throat. Cardiovascular: Denies chest pain. Respiratory: Denies shortness of breath. Gastrointestinal: No abdominal pain.  No nausea, no vomiting.  No diarrhea. Genitourinary: Negative for dysuria. Musculoskeletal: Negative for acute arthralgias Skin: Negative for rash. Neurological: Positive for headaches, negative for weakness/numbness/paresthesias in any extremity Psychiatric: Negative for suicidal ideation/homicidal ideation   ____________________________________________   PHYSICAL EXAM:  VITAL SIGNS: ED Triage Vitals  Enc Vitals Group     BP 06/07/21 2041 (!) 147/84     Pulse Rate 06/07/21 2041 67     Resp 06/07/21 2041 20     Temp 06/07/21 2041 98.9 F (37.2 C)     Temp Source 06/07/21 2041 Oral     SpO2 06/07/21 2041 97 %     Weight 06/07/21 2042 230 lb (104.3 kg)     Height 06/07/21 2042 '5\' 6"'   (1.676 m)     Head Circumference --      Peak Flow --      Pain Score --      Pain Loc --      Pain Edu? --      Excl. in [place]? --    Constitutional: Alert and oriented. Well appearing and in no acute distress. Eyes: Conjunctivae are normal. PERRL. Head: Atraumatic. Nose: No congestion/rhinnorhea. Mouth/Throat: Mucous membranes are moist. Neck: No stridor Cardiovascular: Grossly normal heart sounds.  Good peripheral circulation. Respiratory: Normal respiratory effort.  No retractions. Gastrointestinal: Soft and nontender. No distention. Musculoskeletal: No obvious deformities Neurologic:  Normal speech and language. No gross focal neurologic deficits are appreciated. Skin:  Skin is warm and dry. No rash noted. Psychiatric: Mood and affect are normal. Speech and behavior are normal.  ____________________________________________   LABS (all labs ordered are listed, but only abnormal results are displayed)  Labs Reviewed  BASIC METABOLIC PANEL - Abnormal; Notable for the following components:      Result Value   Calcium 8.8 (*)    All other components within normal limits  URINALYSIS, COMPLETE (UACMP) WITH MICROSCOPIC - Abnormal; Notable for the following components:   Color, Urine YELLOW (*)    APPearance CLEAR (*)    Bacteria, UA RARE (*)    All other components within normal limits  CBC  CBG MONITORING, ED  TROPONIN I (HIGH SENSITIVITY)  TROPONIN I (HIGH SENSITIVITY)   RADIOLOGY  ED MD interpretation: CT of the head without contrast shows no evidence of acute abnormalities including no intracerebral hemorrhage, obvious masses, or significant edema  Official radiology report(s): CT Head Wo Contrast  Result Date: 06/07/2021 CLINICAL DATA:  35 year old female with altered mental status. EXAM: CT HEAD WITHOUT CONTRAST TECHNIQUE: Contiguous axial images were obtained from the base of the skull through the vertex without intravenous contrast. COMPARISON:  Head CT dated  02/27/2017. FINDINGS: Brain: No evidence of acute infarction, hemorrhage, hydrocephalus, extra-axial collection or mass lesion/mass effect. Vascular: No hyperdense vessel or unexpected calcification. Skull: Choose Sinuses/Orbits: There is mucoperiosteal thickening of paranasal sinuses. No air-fluid level. Mastoid air cells are clear. Other: Several nodular calcification the scalp. IMPRESSION: 1. No acute intracranial pathology. 2. Paranasal sinus disease. Electronically Signed   By: Anner Crete M.D.   On: 06/07/2021 21:14    ____________________________________________   PROCEDURES  Procedure(s) performed (  including Critical Care):  .1-3 Lead EKG Interpretation  Date/Time: 06/07/2021 10:27 PM Performed by: Naaman Plummer, MD Authorized by: Naaman Plummer, MD     Interpretation: normal     ECG rate:  58   ECG rate assessment: normal     Rhythm: sinus rhythm     Ectopy: none     Conduction: normal     ____________________________________________   INITIAL IMPRESSION / ASSESSMENT AND PLAN / ED COURSE  As part of my medical decision making, I reviewed the following data within the electronic medical record, if available:  Nursing notes reviewed and incorporated, Labs reviewed, EKG interpreted, Old chart reviewed, Radiograph reviewed and Notes from prior ED visits reviewed and incorporated      Presents with Headache.  No focal neurological symptoms. Neuro exam is benign. Pt is nontoxic. VSS.  Based on history and normal neurological exam I have low suspicion for intracranial tumor, intracranial bleed, meningitis, temporal arteritis, glaucoma, CO poisoning.  Most likely patient has benign headache, recommend rest, hydration, and ibuprofen.  Disposition: Discharge home with strict return precautions and instructions for prompt primary care follow up.     ____________________________________________   FINAL CLINICAL IMPRESSION(S) / ED DIAGNOSES  Final diagnoses:   Migraine with aura and with status migrainosus, not intractable     ED Discharge Orders     None        Note:  This document was prepared using Dragon voice recognition software and may include unintentional dictation errors.    Naaman Plummer, MD 06/07/21 2227

## 2021-06-07 NOTE — ED Triage Notes (Addendum)
Pt reports she came home today with 10/10 HA and husband reported that pt was confused at home for approx 5-10 minutes. Husband reports pt was having memory loss and unable to recall the day. Pt came back to with ability to recall after 5-10 minutes. Pt has hx/o migraines with medication. Pt in triage still unable to recall the events of today and what she did.

## 2021-06-08 DIAGNOSIS — R2689 Other abnormalities of gait and mobility: Secondary | ICD-10-CM | POA: Diagnosis not present

## 2021-06-08 DIAGNOSIS — M25572 Pain in left ankle and joints of left foot: Secondary | ICD-10-CM | POA: Diagnosis not present

## 2021-06-11 DIAGNOSIS — K912 Postsurgical malabsorption, not elsewhere classified: Secondary | ICD-10-CM | POA: Diagnosis not present

## 2021-06-11 DIAGNOSIS — M25572 Pain in left ankle and joints of left foot: Secondary | ICD-10-CM | POA: Diagnosis not present

## 2021-06-11 DIAGNOSIS — R2689 Other abnormalities of gait and mobility: Secondary | ICD-10-CM | POA: Diagnosis not present

## 2021-06-12 DIAGNOSIS — Z9889 Other specified postprocedural states: Secondary | ICD-10-CM | POA: Diagnosis not present

## 2021-06-12 DIAGNOSIS — Z8781 Personal history of (healed) traumatic fracture: Secondary | ICD-10-CM | POA: Diagnosis not present

## 2021-06-14 DIAGNOSIS — R2689 Other abnormalities of gait and mobility: Secondary | ICD-10-CM | POA: Diagnosis not present

## 2021-06-14 DIAGNOSIS — M25572 Pain in left ankle and joints of left foot: Secondary | ICD-10-CM | POA: Diagnosis not present

## 2021-06-19 DIAGNOSIS — R2689 Other abnormalities of gait and mobility: Secondary | ICD-10-CM | POA: Diagnosis not present

## 2021-06-19 DIAGNOSIS — M25572 Pain in left ankle and joints of left foot: Secondary | ICD-10-CM | POA: Diagnosis not present

## 2021-06-20 ENCOUNTER — Encounter: Payer: Self-pay | Admitting: Internal Medicine

## 2021-06-20 ENCOUNTER — Other Ambulatory Visit: Payer: Self-pay | Admitting: Internal Medicine

## 2021-06-20 DIAGNOSIS — E538 Deficiency of other specified B group vitamins: Secondary | ICD-10-CM

## 2021-06-20 MED ORDER — "NEEDLE (DISP) 25G X 1-1/2"" MISC": 1.0000 | 3 refills | Status: DC

## 2021-06-20 MED ORDER — CYANOCOBALAMIN 1000 MCG/ML IJ SOLN
1000.0000 ug | INTRAMUSCULAR | 1 refills | Status: DC
Start: 2021-06-20 — End: 2022-01-18

## 2021-06-20 NOTE — Telephone Encounter (Signed)
Please advise 

## 2021-06-21 DIAGNOSIS — M25572 Pain in left ankle and joints of left foot: Secondary | ICD-10-CM | POA: Diagnosis not present

## 2021-06-21 DIAGNOSIS — R2689 Other abnormalities of gait and mobility: Secondary | ICD-10-CM | POA: Diagnosis not present

## 2021-06-25 DIAGNOSIS — R2689 Other abnormalities of gait and mobility: Secondary | ICD-10-CM | POA: Diagnosis not present

## 2021-06-25 DIAGNOSIS — M25572 Pain in left ankle and joints of left foot: Secondary | ICD-10-CM | POA: Diagnosis not present

## 2021-06-28 DIAGNOSIS — M25572 Pain in left ankle and joints of left foot: Secondary | ICD-10-CM | POA: Diagnosis not present

## 2021-06-28 DIAGNOSIS — R2689 Other abnormalities of gait and mobility: Secondary | ICD-10-CM | POA: Diagnosis not present

## 2021-06-29 DIAGNOSIS — D234 Other benign neoplasm of skin of scalp and neck: Secondary | ICD-10-CM | POA: Diagnosis not present

## 2021-07-02 DIAGNOSIS — R2689 Other abnormalities of gait and mobility: Secondary | ICD-10-CM | POA: Diagnosis not present

## 2021-07-02 DIAGNOSIS — M25572 Pain in left ankle and joints of left foot: Secondary | ICD-10-CM | POA: Diagnosis not present

## 2021-07-03 DIAGNOSIS — Z8781 Personal history of (healed) traumatic fracture: Secondary | ICD-10-CM | POA: Diagnosis not present

## 2021-07-03 DIAGNOSIS — Z9889 Other specified postprocedural states: Secondary | ICD-10-CM | POA: Diagnosis not present

## 2021-07-05 ENCOUNTER — Other Ambulatory Visit (HOSPITAL_COMMUNITY): Payer: Self-pay

## 2021-07-10 DIAGNOSIS — M25572 Pain in left ankle and joints of left foot: Secondary | ICD-10-CM | POA: Diagnosis not present

## 2021-07-10 DIAGNOSIS — R2689 Other abnormalities of gait and mobility: Secondary | ICD-10-CM | POA: Diagnosis not present

## 2021-07-23 DIAGNOSIS — R2689 Other abnormalities of gait and mobility: Secondary | ICD-10-CM | POA: Diagnosis not present

## 2021-07-23 DIAGNOSIS — M25572 Pain in left ankle and joints of left foot: Secondary | ICD-10-CM | POA: Diagnosis not present

## 2021-07-24 DIAGNOSIS — R519 Headache, unspecified: Secondary | ICD-10-CM | POA: Diagnosis not present

## 2021-07-26 DIAGNOSIS — M25572 Pain in left ankle and joints of left foot: Secondary | ICD-10-CM | POA: Diagnosis not present

## 2021-07-26 DIAGNOSIS — R2689 Other abnormalities of gait and mobility: Secondary | ICD-10-CM | POA: Diagnosis not present

## 2021-07-30 DIAGNOSIS — R2689 Other abnormalities of gait and mobility: Secondary | ICD-10-CM | POA: Diagnosis not present

## 2021-07-30 DIAGNOSIS — M25572 Pain in left ankle and joints of left foot: Secondary | ICD-10-CM | POA: Diagnosis not present

## 2021-07-30 NOTE — Telephone Encounter (Signed)
Called and offer appointment for cancellation. Before I could add patient to scheduled opening was taken. Patient aware we have added her for the wait list is another cancellation comes open we will reach out to her. I did offer to bring patient in for breast exam only. Patient refused stating she wanted to be seen for annual with Breast exam because she lives far from the practice.

## 2021-08-01 ENCOUNTER — Encounter: Payer: Self-pay | Admitting: Internal Medicine

## 2021-08-01 ENCOUNTER — Other Ambulatory Visit: Payer: Self-pay | Admitting: Family

## 2021-08-01 MED ORDER — ALPRAZOLAM 0.25 MG PO TABS: 0.2500 mg | ORAL_TABLET | Freq: Two times a day (BID) | ORAL | 0 refills | Status: DC | PRN

## 2021-08-01 NOTE — Telephone Encounter (Signed)
Please advise 

## 2021-08-02 DIAGNOSIS — M21372 Foot drop, left foot: Secondary | ICD-10-CM | POA: Diagnosis not present

## 2021-08-05 DIAGNOSIS — U071 COVID-19: Secondary | ICD-10-CM | POA: Diagnosis not present

## 2021-08-08 ENCOUNTER — Ambulatory Visit: Payer: BC Managed Care – PPO | Admitting: Dietician

## 2021-08-16 ENCOUNTER — Telehealth: Payer: BC Managed Care – PPO | Admitting: Physician Assistant

## 2021-08-16 ENCOUNTER — Encounter: Payer: BC Managed Care – PPO | Attending: Surgery | Admitting: Dietician

## 2021-08-16 ENCOUNTER — Other Ambulatory Visit: Payer: Self-pay

## 2021-08-16 ENCOUNTER — Encounter: Payer: Self-pay | Admitting: Dietician

## 2021-08-16 VITALS — Ht 66.0 in | Wt 217.9 lb

## 2021-08-16 DIAGNOSIS — E669 Obesity, unspecified: Secondary | ICD-10-CM

## 2021-08-16 DIAGNOSIS — J019 Acute sinusitis, unspecified: Secondary | ICD-10-CM | POA: Diagnosis not present

## 2021-08-16 DIAGNOSIS — B379 Candidiasis, unspecified: Secondary | ICD-10-CM

## 2021-08-16 DIAGNOSIS — B9689 Other specified bacterial agents as the cause of diseases classified elsewhere: Secondary | ICD-10-CM | POA: Diagnosis not present

## 2021-08-16 DIAGNOSIS — F419 Anxiety disorder, unspecified: Secondary | ICD-10-CM | POA: Diagnosis not present

## 2021-08-16 DIAGNOSIS — Z6835 Body mass index (BMI) 35.0-35.9, adult: Secondary | ICD-10-CM

## 2021-08-16 DIAGNOSIS — T3695XA Adverse effect of unspecified systemic antibiotic, initial encounter: Secondary | ICD-10-CM

## 2021-08-16 MED ORDER — BUSPIRONE HCL 5 MG PO TABS: 5.0000 mg | ORAL_TABLET | Freq: Three times a day (TID) | ORAL | 0 refills | Status: DC

## 2021-08-16 MED ORDER — FLUCONAZOLE 150 MG PO TABS
150.0000 mg | ORAL_TABLET | Freq: Once | ORAL | 0 refills | Status: AC
Start: 2021-08-16 — End: 2021-08-16

## 2021-08-16 MED ORDER — DOXYCYCLINE HYCLATE 100 MG PO TABS: 100.0000 mg | ORAL_TABLET | Freq: Two times a day (BID) | ORAL | 0 refills | Status: DC

## 2021-08-16 NOTE — Progress Notes (Signed)
Nutrition Therapy for Post-Operative Bariatric Diet Follow-up visit:  9 months  post-op sleeve gastrectomy Surgery  Medical Nutrition Therapy:  Appt start time: 1110 end time:  1145  Anthropometrics:  Date 10/17/20 11/24/20 01/11/21 05/03/21 08/16/21  BMI 45.9 43.7 40.7 37.6 35.2  Weight (lbs) 284.5 270.6 252.2 233.2 217.9  Skeletal muscle (lbs) 77.4 73.0 69.4   ---- 66.8  % body fat 51.5 51.9 50.4    ---- 44.6   Clinical: Medications: reconciled list in medical record Supplementation: bariatric multivitamin daily + calcium supplement 2-3x daily Health/ medical history changes: no changes GI symptoms: N/V/D/C: loose stools, nausea/ anorexia Dumping Syndrome: none Hair loss: yes  Dietary/ Lifestyle Progress: Patient reports increased symptoms of anxiety and depression recently; Has not been eating or drinking much, reports loss of about 10lbs in past week.  She is starting Buspar today.  Accidentally forgot her zoloft for about 2 weeks, now getting back to normal with mood after restarting zoloft. She reports the thought of eating is causing nausea due to anxiety, resumption of medication is the primary help in resuming healthy eating pattern. Had increased fluid intake to goal or above until beoming ill. Now struggles to drink even 1/2 bottle of water daily.   Dietary recall: Eating pattern: very low calorie currently, no structure or schedule Dining out: n/a Breakfast: none recently Snack: none  Lunch: protein shake or bar, struggle to finish  Snack: none  Dinner: 8/31 small amount of pork Snack: none  Fluid intake: gatorade usu zero sugar; water  -- maybe 1/2 bottle for entire day.  Estimated total protein intake: 20-40g daily Bariatric diet adherence:  Using straws: occasionally -- did have increased gas once so now avoiding Drinking fluids during meals: no Carbonated beverages: no  Recent physical activity:  started walking within past month -- PT for leg  fracture   Nutrition Intervention:   Reviewed progress since previous visit. Discussed poor appetite, encouraged small amounts of food or fluid frequently throughout the day; discussed option of a planned schedule or starting with grazing pattern and gradually progressing toward "normal" meal and snack pattern. Discussed importance of increasing fluid intake to avoid dehydration crisis.  Instructed on 9 month post op eating pattern and balance, bariatric meal plate, bariatric menu options.  Nutritional Diagnosis:  [place]-3.3 Overweight/obesity As related to history of excess calories, inadequate physical activity.  As evidenced by patient with current BMI of 35.17, with current poor appetite related to anxiety.  Teaching Method Utilized:  Animator provided: 800-1000kcal bariatric menus (AND)  Bariatric Meal Plate handout Visit summary with goals/ instructions to be viewed via MyChart  Learning Readiness:  Change in progress  Barriers to learning/adherence to lifestyle change: anxiety, improving  Demonstrated degree of understanding via:  Teach Back      Plan: Return for 12 month post-op MNT, to be scheduled.

## 2021-08-16 NOTE — Progress Notes (Signed)
Virtual Visit Consent   Debbie Townsend, you are scheduled for a virtual visit with a [facility] provider today.     Just as with appointments in the office, your consent must be obtained to participate.  Your consent will be active for this visit and any virtual visit you may have with one of our providers in the next 365 days.     If you have a MyChart account, a copy of this consent can be sent to you electronically.  All virtual visits are billed to your insurance company just like a traditional visit in the office.    As this is a virtual visit, video technology does not allow for your provider to perform a traditional examination.  This may limit your provider's ability to fully assess your condition.  If your provider identifies any concerns that need to be evaluated in person or the need to arrange testing (such as labs, EKG, etc.), we will make arrangements to do so.     Although advances in technology are sophisticated, we cannot ensure that it will always work on either your end or our end.  If the connection with a video visit is poor, the visit may have to be switched to a telephone visit.  With either a video or telephone visit, we are not always able to ensure that we have a secure connection.     I need to obtain your verbal consent now.   Are you willing to proceed with your visit today?    Debbie Townsend has provided verbal consent on 08/16/2021 for a virtual visit (video or telephone).   Mar Daring, PA-C   Date: 08/16/2021 9:24 AM   Virtual Visit via Video Note   I, Mar Daring, connected with  Debbie Townsend  ([id number], November 16, 1986) on 08/16/21 at  9:15 AM EDT by a video-enabled telemedicine application and verified that I am speaking with the correct person using two identifiers.  Location: Patient: Virtual Visit Location Patient: Home Provider: Virtual Visit Location Provider: Home Office   I discussed the limitations of evaluation and  management by telemedicine and the availability of in person appointments. The patient expressed understanding and agreed to proceed.    History of Present Illness: Debbie Townsend is a 35 y.o. who identifies as a female who was assigned female at birth, and is being seen today for sinusitis and anxiety.  HPI: Sinusitis This is a new problem. Episode onset: had covid 19 about 2 weeks ago; but symptoms of sinus congestion and cough. The problem has been gradually worsening since onset. There has been no fever. Associated symptoms include congestion, coughing, a hoarse voice and sinus pressure. Pertinent negatives include no shortness of breath or sore throat. Past treatments include oral decongestants. The treatment provided no relief.  Anxiety Presents for follow-up visit. Symptoms include depressed mood, excessive worry, muscle tension, nausea and nervous/anxious behavior. Patient reports no shortness of breath. Symptoms occur constantly. The severity of symptoms is moderate and interfering with daily activities. The quality of sleep is fair.    Recently was off sertraline for about 3 weeks. Just restarted. Had Rx for alprazolam 0.'25mg'$ , makes too drowsy so not using often. Anxiety symptoms are worse in the morning. This morning had some improvements in symptoms, but unsure if due to restarting Sertraline or if a fluke good morning.   Problems:  Patient Active Problem List   Diagnosis Date Noted   Obesity (BMI 30-39.9) 05/07/2021  Iron deficiency 11/03/2020   Hair loss 07/03/2020   Prediabetes 07/03/2020   Depression, recurrent ([place]) 07/03/2020   Intractable migraine without aura and without status migrainosus 04/04/2020   Gastroesophageal reflux disease without esophagitis 03/29/2020   Chronic midline low back pain 02/13/2020   CSF leak from nose 12/07/2019   Other optic atrophy, bilateral 12/07/2019   IIH (idiopathic intracranial hypertension) 12/07/2019   Annual physical exam  09/24/2019   Insomnia 09/24/2019   Fatigue 09/24/2019   History of kidney stones 09/24/2019   Allergic rhinitis 03/24/2019   Status post laparoscopic hysterectomy 02/12/2019   Adenomyosis 02/04/2019   DUB (dysfunctional uterine bleeding) 09/10/2018   Fatty liver 04/02/2018   Gallstones 04/02/2018   Heavy menses 04/02/2018   FH: breast cancer in first degree relative 04/02/2018   Anxiety 04/02/2018   Anxiety and depression 12/16/2016   Cesarean delivery delivered 03/22/2016   Rubella non-immune status, antepartum 01/08/2016   Essential hypertension 11/13/2015   Pseudotumor cerebri 11/13/2015   HNP (herniated nucleus pulposus), lumbar 09/22/2014    Allergies:  Allergies  Allergen Reactions   Apple Other (See Comments)    Lips itch and swell, inside of mouth gets hives   Carrot [Daucus [place]ota] Other (See Comments)    Lips itch and swell, inside of mouth gets hives   Monistat [Miconazole] Swelling    Severe vaginal itching and swelling   Other Itching and Swelling    Almonds-mouth swells and itches   Almond Oil Hives   Amoxicillin Hives, Itching, Swelling and Dermatitis    Did it involve swelling of the face/tongue/throat, SOB, or low BP? No Did it involve sudden or severe rash/hives, skin peeling, or any reaction on the inside of your mouth or nose? Yes Did you need to seek medical attention at a hospital or doctor's office? Yes When did it last happen?    within the past 5 years If all above answers are "NO", may proceed with cephalosporin use.    Imitrex [Sumatriptan]     Felt like heart attack    Topamax [Topiramate]     Memory loss     Medications:  Current Outpatient Medications:    busPIRone (BUSPAR) 5 MG tablet, Take 1 tablet (5 mg total) by mouth 3 (three) times daily., Disp: 90 tablet, Rfl: 0   doxycycline (VIBRA-TABS) 100 MG tablet, Take 1 tablet (100 mg total) by mouth 2 (two) times daily., Disp: 20 tablet, Rfl: 0   fluconazole (DIFLUCAN) 150 MG tablet, Take  1 tablet (150 mg total) by mouth once for 1 dose., Disp: 1 tablet, Rfl: 0   AIMOVIG 140 MG/ML SOAJ, Take 140 mg by mouth every 30 (thirty) days.  (Patient not taking: Reported on 05/03/2021), Disp: , Rfl:    ALPRAZolam (XANAX) 0.25 MG tablet, Take 1 tablet (0.25 mg total) by mouth 2 (two) times daily as needed for anxiety., Disp: 30 tablet, Rfl: 0   Biotin w/ Vitamins C & E (HAIR/SKIN/NAILS PO), Take by mouth daily., Disp: , Rfl:    CALCIUM CITRATE PO, Take by mouth., Disp: , Rfl:    cyanocobalamin (,VITAMIN B-12,) 1000 MCG/ML injection, Inject 1 mL (1,000 mcg total) into the muscle every 14 (fourteen) days., Disp: 14 mL, Rfl: 1   cyclobenzaprine (FLEXERIL) 5 MG tablet, Take 1-2 tablets (5-10 mg total) by mouth at bedtime as needed for muscle spasms., Disp: 60 tablet, Rfl: 11   Multiple Vitamins-Minerals (MULTIVITAMIN WITH MINERALS) tablet, Take 1 tablet by mouth daily. Bariatric advantage one a day, Disp: ,  Rfl:    NEEDLE, DISP, 25 G 25G X 1-1/2" MISC, 1 Device by Does not apply route every 14 (fourteen) days., Disp: 12 each, Rfl: 3   OVER THE COUNTER MEDICATION, Take 1 tablet by mouth at bedtime. Tranquil Sleep, Disp: , Rfl:    pantoprazole (PROTONIX) 40 MG tablet, TAKE 1 TABLET BY MOUTH DAILY, Disp: 90 tablet, Rfl: 0   Rimegepant Sulfate (NURTEC PO), Take 75 mg by mouth. Once daily prn abortive for migraines, Disp: , Rfl:    sertraline (ZOLOFT) 100 MG tablet, TAKE 1 TABLET BY MOUTH EVERY DAY, Disp: 90 tablet, Rfl: 3  Observations/Objective: Patient is well-developed, well-nourished in no acute distress.  Resting comfortably at home.  Head is normocephalic, atraumatic.  No labored breathing.  Speech is clear and coherent with logical content.  Patient is alert and oriented at baseline.    Assessment and Plan: 1. Acute bacterial sinusitis - doxycycline (VIBRA-TABS) 100 MG tablet; Take 1 tablet (100 mg total) by mouth 2 (two) times daily.  Dispense: 20 tablet; Refill: 0  2. Anxiety -  busPIRone (BUSPAR) 5 MG tablet; Take 1 tablet (5 mg total) by mouth 3 (three) times daily.  Dispense: 90 tablet; Refill: 0  3. Antibiotic-induced yeast infection - fluconazole (DIFLUCAN) 150 MG tablet; Take 1 tablet (150 mg total) by mouth once for 1 dose.  Dispense: 1 tablet; Refill: 0  - Suspect secondary sinusitis following Covid - Worsening symptoms that have not responded to OTC medications.  - Will give Doxycycline as below.  - Continue allergy medications.  - Stay well hydrated and get plenty of rest.  - Gets yeast infections with antibiotics.  - Diflucan given as below.  - Buspirone added for anxiety - Continue Sertraline '100mg'$  - Call if no symptom improvement or if symptoms worsen.  Follow Up Instructions: I discussed the assessment and treatment plan with the patient. The patient was provided an opportunity to ask questions and all were answered. The patient agreed with the plan and demonstrated an understanding of the instructions.  A copy of instructions were sent to the patient via MyChart.  The patient was advised to call back or seek an in-person evaluation if the symptoms worsen or if the condition fails to improve as anticipated.  Time:  I spent 15 minutes with the patient via telehealth technology discussing the above problems/concerns.    Mar Daring, PA-C

## 2021-08-16 NOTE — Patient Instructions (Addendum)
Gradually resume food and fluid intake, concentrating on meeting daily fluid and protein goals.  Try a grazing pattern, a few bites/ sips every 1-2 hours; gradually shift to more food at a time with fluids in between meals and snacks.  Continue to inlcude regular activity/ exercise, great job so far!

## 2021-08-16 NOTE — Patient Instructions (Signed)
Debbie Townsend, thank you for joining Mar Daring, PA-C for today's virtual visit.  While this provider is not your primary care provider (PCP), if your PCP is located in our provider database this encounter information will be shared with them immediately following your visit.  Consent: (Patient) Debbie Townsend provided verbal consent for this virtual visit at the beginning of the encounter.  Current Medications:  Current Outpatient Medications:    busPIRone (BUSPAR) 5 MG tablet, Take 1 tablet (5 mg total) by mouth 3 (three) times daily., Disp: 90 tablet, Rfl: 0   doxycycline (VIBRA-TABS) 100 MG tablet, Take 1 tablet (100 mg total) by mouth 2 (two) times daily., Disp: 20 tablet, Rfl: 0   fluconazole (DIFLUCAN) 150 MG tablet, Take 1 tablet (150 mg total) by mouth once for 1 dose., Disp: 1 tablet, Rfl: 0   AIMOVIG 140 MG/ML SOAJ, Take 140 mg by mouth every 30 (thirty) days.  (Patient not taking: Reported on 05/03/2021), Disp: , Rfl:    ALPRAZolam (XANAX) 0.25 MG tablet, Take 1 tablet (0.25 mg total) by mouth 2 (two) times daily as needed for anxiety., Disp: 30 tablet, Rfl: 0   Biotin w/ Vitamins C & E (HAIR/SKIN/NAILS PO), Take by mouth daily., Disp: , Rfl:    CALCIUM CITRATE PO, Take by mouth., Disp: , Rfl:    cyanocobalamin (,VITAMIN B-12,) 1000 MCG/ML injection, Inject 1 mL (1,000 mcg total) into the muscle every 14 (fourteen) days., Disp: 14 mL, Rfl: 1   cyclobenzaprine (FLEXERIL) 5 MG tablet, Take 1-2 tablets (5-10 mg total) by mouth at bedtime as needed for muscle spasms., Disp: 60 tablet, Rfl: 11   Multiple Vitamins-Minerals (MULTIVITAMIN WITH MINERALS) tablet, Take 1 tablet by mouth daily. Bariatric advantage one a day, Disp: , Rfl:    NEEDLE, DISP, 25 G 25G X 1-1/2" MISC, 1 Device by Does not apply route every 14 (fourteen) days., Disp: 12 each, Rfl: 3   OVER THE COUNTER MEDICATION, Take 1 tablet by mouth at bedtime. Tranquil Sleep, Disp: , Rfl:    pantoprazole (PROTONIX) 40  MG tablet, TAKE 1 TABLET BY MOUTH DAILY, Disp: 90 tablet, Rfl: 0   Rimegepant Sulfate (NURTEC PO), Take 75 mg by mouth. Once daily prn abortive for migraines, Disp: , Rfl:    sertraline (ZOLOFT) 100 MG tablet, TAKE 1 TABLET BY MOUTH EVERY DAY, Disp: 90 tablet, Rfl: 3   Medications ordered in this encounter:  Meds ordered this encounter  Medications   doxycycline (VIBRA-TABS) 100 MG tablet    Sig: Take 1 tablet (100 mg total) by mouth 2 (two) times daily.    Dispense:  20 tablet    Refill:  0    Order Specific Question:   Supervising Provider    Answer:   MILLER, BRIAN [[id number]]   busPIRone (BUSPAR) 5 MG tablet    Sig: Take 1 tablet (5 mg total) by mouth 3 (three) times daily.    Dispense:  90 tablet    Refill:  0    Order Specific Question:   Supervising Provider    Answer:   Sabra Heck, BRIAN [[id number]]   fluconazole (DIFLUCAN) 150 MG tablet    Sig: Take 1 tablet (150 mg total) by mouth once for 1 dose.    Dispense:  1 tablet    Refill:  0    Order Specific Question:   Supervising Provider    Answer:   Sabra Heck, BRIAN [id number]     *If you need refills on other medications  prior to your next appointment, please contact your pharmacy*  Follow-Up: Call back or seek an in-person evaluation if the symptoms worsen or if the condition fails to improve as anticipated.  Other Instructions Buspirone Tablets What is this medication? BUSPIRONE (byoo SPYE rone) treats anxiety. It works by balancing the levels of dopamine and serotonin in your brain, hormones that help regulate mood. This medicine may be used for other purposes; ask your health care provider or pharmacist if you have questions. COMMON BRAND NAME(S): BuSpar What should I tell my care team before I take this medication? They need to know if you have any of these conditions: Kidney or liver disease An unusual or allergic reaction to buspirone, other medications, foods, dyes, or preservatives Pregnant or trying to get  pregnant Breast-feeding How should I use this medication? Take this medication by mouth with a glass of water. Follow the directions on the prescription label. You may take this medication with or without food. To ensure that this medication always works the same way for you, you should take it either always with or always without food. Take your doses at regular intervals. Do not take your medication more often than directed. Do not stop taking except on the advice of your care team. Talk to your care team about the use of this medication in children. Special care may be needed. Overdosage: If you think you have taken too much of this medicine contact a poison control center or emergency room at once. NOTE: This medicine is only for you. Do not share this medicine with others. What if I miss a dose? If you miss a dose, take it as soon as you can. If it is almost time for your next dose, take only that dose. Do not take double or extra doses. What may interact with this medication? Do not take this medication with any of the following: Linezolid MAOIs like Carbex, Eldepryl, Marplan, Nardil, and Parnate Methylene blue Procarbazine This medication may also interact with the following: Diazepam Digoxin Diltiazem Erythromycin Grapefruit juice Haloperidol Medications for mental depression or mood problems Medications for seizures like carbamazepine, phenobarbital and phenytoin Nefazodone Other medications for anxiety Rifampin Ritonavir Some antifungal medications like itraconazole, ketoconazole, and voriconazole Verapamil Warfarin This list may not describe all possible interactions. Give your health care provider a list of all the medicines, herbs, non-prescription drugs, or dietary supplements you use. Also tell them if you smoke, drink alcohol, or use illegal drugs. Some items may interact with your medicine. What should I watch for while using this medication? Visit your care team for  regular checks on your progress. It may take 1 to 2 weeks before your anxiety gets better. You may get drowsy or dizzy. Do not drive, use machinery, or do anything that needs mental alertness until you know how this medication affects you. Do not stand or sit up quickly, especially if you are an older patient. This reduces the risk of dizzy or fainting spells. Alcohol can make you more drowsy and dizzy. Avoid alcoholic drinks. What side effects may I notice from receiving this medication? Side effects that you should report to your care team as soon as possible: Allergic reactions-skin rash, itching, hives, swelling of the face, lips, tongue, or throat Irritability, confusion, fast or irregular heartbeat, muscle stiffness, twitching muscles, sweating, high fever, seizure, chills, vomiting, diarrhea, which may be signs of serotonin syndrome Side effects that usually do not require medical attention (report to your care team if they continue  or are bothersome): Anxiety or nervousness Dizziness Drowsiness Headache Nausea Trouble sleeping This list may not describe all possible side effects. Call your doctor for medical advice about side effects. You may report side effects to FDA at 1-800-FDA-1088. Where should I keep my medication? Keep out of the reach of children. Store at room temperature below 30 degrees C (86 degrees F). Protect from light. Keep container tightly closed. Throw away any unused medication after the expiration date. NOTE: This sheet is a summary. It may not cover all possible information. If you have questions about this medicine, talk to your doctor, pharmacist, or health care provider.  2022 Elsevier/Gold Standard (2021-02-28 09:59:46)    If you have been instructed to have an in-person evaluation today at a local Urgent Care facility, please use the link below. It will take you to a list of all of our available [facility], including address, phone number and  hours of operation. Please do not delay care.  [facility]  If you or a family member do not have a primary care provider, use the link below to schedule a visit and establish care. When you choose a [facility] primary care physician or advanced practice provider, you gain a long-term partner in health. Find a Primary Care Provider  Learn more about [facility]'s in-office and virtual care options: [facility]

## 2021-08-16 NOTE — Telephone Encounter (Signed)
Patient is returning your call about the message below.

## 2021-08-22 DIAGNOSIS — R2689 Other abnormalities of gait and mobility: Secondary | ICD-10-CM | POA: Diagnosis not present

## 2021-08-22 DIAGNOSIS — M25572 Pain in left ankle and joints of left foot: Secondary | ICD-10-CM | POA: Diagnosis not present

## 2021-08-24 ENCOUNTER — Telehealth: Payer: BC Managed Care – PPO | Admitting: Physician Assistant

## 2021-08-24 DIAGNOSIS — B9689 Other specified bacterial agents as the cause of diseases classified elsewhere: Secondary | ICD-10-CM | POA: Diagnosis not present

## 2021-08-24 DIAGNOSIS — J019 Acute sinusitis, unspecified: Secondary | ICD-10-CM

## 2021-08-24 MED ORDER — PREDNISONE 20 MG PO TABS: 40.0000 mg | ORAL_TABLET | Freq: Every day | ORAL | 0 refills | Status: DC

## 2021-08-24 MED ORDER — CEFDINIR 300 MG PO CAPS: 300.0000 mg | ORAL_CAPSULE | Freq: Two times a day (BID) | ORAL | 0 refills | Status: DC

## 2021-08-24 NOTE — Progress Notes (Signed)
Virtual Visit Consent   GILBERTO CELENTANO, you are scheduled for a virtual visit with a [facility] provider today.     Just as with appointments in the office, your consent must be obtained to participate.  Your consent will be active for this visit and any virtual visit you may have with one of our providers in the next 365 days.     If you have a MyChart account, a copy of this consent can be sent to you electronically.  All virtual visits are billed to your insurance company just like a traditional visit in the office.    As this is a virtual visit, video technology does not allow for your provider to perform a traditional examination.  This may limit your provider's ability to fully assess your condition.  If your provider identifies any concerns that need to be evaluated in person or the need to arrange testing (such as labs, EKG, etc.), we will make arrangements to do so.     Although advances in technology are sophisticated, we cannot ensure that it will always work on either your end or our end.  If the connection with a video visit is poor, the visit may have to be switched to a telephone visit.  With either a video or telephone visit, we are not always able to ensure that we have a secure connection.     I need to obtain your verbal consent now.   Are you willing to proceed with your visit today?    ALYSSABETH BEZY has provided verbal consent on 08/24/2021 for a virtual visit (video or telephone).   Mar Daring, PA-C   Date: 08/24/2021 2:59 PM   Virtual Visit via Video Note   I, Mar Daring, connected with  NOVENA PATERSON  ([id number], 07/01/1986) on 08/24/21 at  3:00 PM EDT by a video-enabled telemedicine application and verified that I am speaking with the correct person using two identifiers.  Location: Patient: Mobile Provider: Virtual Visit Location Provider: Home Office   I discussed the limitations of evaluation and management by telemedicine and the  availability of in person appointments. The patient expressed understanding and agreed to proceed.    History of Present Illness: LUPITA LITHERLAND is a 35 y.o. who identifies as a female who was assigned female at birth, and is being seen today for continued sinus congestion. Patient was seen on 08/16/21 and was started on a 10 day Doxycycline. She will complete tomorrow. Right ear is feeling increased pressure and still coughing up green phlegm. Having sinus congestion and pain. No fevers, chills, nausea, or vomiting.   Problems:  Patient Active Problem List   Diagnosis Date Noted   Obesity (BMI 30-39.9) 05/07/2021   Iron deficiency 11/03/2020   Hair loss 07/03/2020   Prediabetes 07/03/2020   Depression, recurrent ([place]) 07/03/2020   Intractable migraine without aura and without status migrainosus 04/04/2020   Gastroesophageal reflux disease without esophagitis 03/29/2020   Chronic midline low back pain 02/13/2020   CSF leak from nose 12/07/2019   Other optic atrophy, bilateral 12/07/2019   IIH (idiopathic intracranial hypertension) 12/07/2019   Annual physical exam 09/24/2019   Insomnia 09/24/2019   Fatigue 09/24/2019   History of kidney stones 09/24/2019   Allergic rhinitis 03/24/2019   Status post laparoscopic hysterectomy 02/12/2019   Adenomyosis 02/04/2019   DUB (dysfunctional uterine bleeding) 09/10/2018   Fatty liver 04/02/2018   Gallstones 04/02/2018   Heavy menses 04/02/2018   FH: breast  cancer in first degree relative 04/02/2018   Anxiety 04/02/2018   Anxiety and depression 12/16/2016   Cesarean delivery delivered 03/22/2016   Rubella non-immune status, antepartum 01/08/2016   Essential hypertension 11/13/2015   Pseudotumor cerebri 11/13/2015   HNP (herniated nucleus pulposus), lumbar 09/22/2014    Allergies:  Allergies  Allergen Reactions   Apple Other (See Comments)    Lips itch and swell, inside of mouth gets hives   Carrot [Daucus Carota] Other (See Comments)     Lips itch and swell, inside of mouth gets hives   Monistat [Miconazole] Swelling    Severe vaginal itching and swelling   Other Itching and Swelling    Almonds-mouth swells and itches   Almond Oil Hives   Amoxicillin Hives, Itching, Swelling and Dermatitis    Did it involve swelling of the face/tongue/throat, SOB, or low BP? No Did it involve sudden or severe rash/hives, skin peeling, or any reaction on the inside of your mouth or nose? Yes Did you need to seek medical attention at a hospital or doctor's office? Yes When did it last happen?    within the past 5 years If all above answers are "NO", may proceed with cephalosporin use.    Imitrex [Sumatriptan]     Felt like heart attack    Topamax [Topiramate]     Memory loss     Medications:  Current Outpatient Medications:    cefdinir (OMNICEF) 300 MG capsule, Take 1 capsule (300 mg total) by mouth 2 (two) times daily., Disp: 14 capsule, Rfl: 0   predniSONE (DELTASONE) 20 MG tablet, Take 2 tablets (40 mg total) by mouth daily with breakfast., Disp: 14 tablet, Rfl: 0   AIMOVIG 140 MG/ML SOAJ, Take 140 mg by mouth every 30 (thirty) days., Disp: , Rfl:    ALPRAZolam (XANAX) 0.25 MG tablet, Take 1 tablet (0.25 mg total) by mouth 2 (two) times daily as needed for anxiety., Disp: 30 tablet, Rfl: 0   Biotin w/ Vitamins C & E (HAIR/SKIN/NAILS PO), Take by mouth daily., Disp: , Rfl:    busPIRone (BUSPAR) 5 MG tablet, Take 1 tablet (5 mg total) by mouth 3 (three) times daily., Disp: 90 tablet, Rfl: 0   CALCIUM CITRATE PO, Take by mouth., Disp: , Rfl:    cyanocobalamin (,VITAMIN B-12,) 1000 MCG/ML injection, Inject 1 mL (1,000 mcg total) into the muscle every 14 (fourteen) days., Disp: 14 mL, Rfl: 1   cyclobenzaprine (FLEXERIL) 5 MG tablet, Take 1-2 tablets (5-10 mg total) by mouth at bedtime as needed for muscle spasms., Disp: 60 tablet, Rfl: 11   doxycycline (VIBRA-TABS) 100 MG tablet, Take 1 tablet (100 mg total) by mouth 2 (two) times  daily., Disp: 20 tablet, Rfl: 0   Multiple Vitamins-Minerals (MULTIVITAMIN WITH MINERALS) tablet, Take 1 tablet by mouth daily. Bariatric advantage one a day, Disp: , Rfl:    NEEDLE, DISP, 25 G 25G X 1-1/2" MISC, 1 Device by Does not apply route every 14 (fourteen) days., Disp: 12 each, Rfl: 3   OVER THE COUNTER MEDICATION, Take 1 tablet by mouth at bedtime. Tranquil Sleep, Disp: , Rfl:    pantoprazole (PROTONIX) 40 MG tablet, TAKE 1 TABLET BY MOUTH DAILY, Disp: 90 tablet, Rfl: 0   Rimegepant Sulfate (NURTEC PO), Take 75 mg by mouth. Once daily prn abortive for migraines, Disp: , Rfl:    sertraline (ZOLOFT) 100 MG tablet, TAKE 1 TABLET BY MOUTH EVERY DAY, Disp: 90 tablet, Rfl: 3  Observations/Objective: Patient is well-developed, well-nourished  in no acute distress.  Resting comfortably at home.  Head is normocephalic, atraumatic.  No labored breathing.  Speech is clear and coherent with logical content.  Patient is alert and oriented at baseline.    Assessment and Plan: 1. Acute bacterial sinusitis - cefdinir (OMNICEF) 300 MG capsule; Take 1 capsule (300 mg total) by mouth 2 (two) times daily.  Dispense: 14 capsule; Refill: 0 - predniSONE (DELTASONE) 20 MG tablet; Take 2 tablets (40 mg total) by mouth daily with breakfast.  Dispense: 14 tablet; Refill: 0  - Failed Doxycycline - Change to Omnicef  - Add prednisone for inflammation - Continue to push fluids and rest as needed - Seek in person evaluation if symptoms fail to improve or worsen  Follow Up Instructions: I discussed the assessment and treatment plan with the patient. The patient was provided an opportunity to ask questions and all were answered. The patient agreed with the plan and demonstrated an understanding of the instructions.  A copy of instructions were sent to the patient via MyChart.  The patient was advised to call back or seek an in-person evaluation if the symptoms worsen or if the condition fails to improve as  anticipated.  Time:  I spent 8 minutes with the patient via telehealth technology discussing the above problems/concerns.    Mar Daring, PA-C

## 2021-08-24 NOTE — Patient Instructions (Signed)
Debbie Townsend, thank you for joining Mar Daring, PA-C for today's virtual visit.  While this provider is not your primary care provider (PCP), if your PCP is located in our provider database this encounter information will be shared with them immediately following your visit.  Consent: (Patient) Debbie Townsend provided verbal consent for this virtual visit at the beginning of the encounter.  Current Medications:  Current Outpatient Medications:    cefdinir (OMNICEF) 300 MG capsule, Take 1 capsule (300 mg total) by mouth 2 (two) times daily., Disp: 14 capsule, Rfl: 0   predniSONE (DELTASONE) 20 MG tablet, Take 2 tablets (40 mg total) by mouth daily with breakfast., Disp: 14 tablet, Rfl: 0   AIMOVIG 140 MG/ML SOAJ, Take 140 mg by mouth every 30 (thirty) days., Disp: , Rfl:    ALPRAZolam (XANAX) 0.25 MG tablet, Take 1 tablet (0.25 mg total) by mouth 2 (two) times daily as needed for anxiety., Disp: 30 tablet, Rfl: 0   Biotin w/ Vitamins C & E (HAIR/SKIN/NAILS PO), Take by mouth daily., Disp: , Rfl:    busPIRone (BUSPAR) 5 MG tablet, Take 1 tablet (5 mg total) by mouth 3 (three) times daily., Disp: 90 tablet, Rfl: 0   CALCIUM CITRATE PO, Take by mouth., Disp: , Rfl:    cyanocobalamin (,VITAMIN B-12,) 1000 MCG/ML injection, Inject 1 mL (1,000 mcg total) into the muscle every 14 (fourteen) days., Disp: 14 mL, Rfl: 1   cyclobenzaprine (FLEXERIL) 5 MG tablet, Take 1-2 tablets (5-10 mg total) by mouth at bedtime as needed for muscle spasms., Disp: 60 tablet, Rfl: 11   doxycycline (VIBRA-TABS) 100 MG tablet, Take 1 tablet (100 mg total) by mouth 2 (two) times daily., Disp: 20 tablet, Rfl: 0   Multiple Vitamins-Minerals (MULTIVITAMIN WITH MINERALS) tablet, Take 1 tablet by mouth daily. Bariatric advantage one a day, Disp: , Rfl:    NEEDLE, DISP, 25 G 25G X 1-1/2" MISC, 1 Device by Does not apply route every 14 (fourteen) days., Disp: 12 each, Rfl: 3   OVER THE COUNTER MEDICATION, Take 1  tablet by mouth at bedtime. Tranquil Sleep, Disp: , Rfl:    pantoprazole (PROTONIX) 40 MG tablet, TAKE 1 TABLET BY MOUTH DAILY, Disp: 90 tablet, Rfl: 0   Rimegepant Sulfate (NURTEC PO), Take 75 mg by mouth. Once daily prn abortive for migraines, Disp: , Rfl:    sertraline (ZOLOFT) 100 MG tablet, TAKE 1 TABLET BY MOUTH EVERY DAY, Disp: 90 tablet, Rfl: 3   Medications ordered in this encounter:  Meds ordered this encounter  Medications   cefdinir (OMNICEF) 300 MG capsule    Sig: Take 1 capsule (300 mg total) by mouth 2 (two) times daily.    Dispense:  14 capsule    Refill:  0    Order Specific Question:   Supervising Provider    Answer:   MILLER, BRIAN [[id number]]   predniSONE (DELTASONE) 20 MG tablet    Sig: Take 2 tablets (40 mg total) by mouth daily with breakfast.    Dispense:  14 tablet    Refill:  0    Order Specific Question:   Supervising Provider    Answer:   Noemi Chapel [[id number]]     *If you need refills on other medications prior to your next appointment, please contact your pharmacy*  Follow-Up: Call back or seek an in-person evaluation if the symptoms worsen or if the condition fails to improve as anticipated.  Other Instructions Sinusitis, Adult Sinusitis is soreness and  swelling (inflammation) of your sinuses. Sinuses are hollow spaces in the bones around your face. They are located: Around your eyes. In the middle of your forehead. Behind your nose. In your cheekbones. Your sinuses and nasal passages are lined with a fluid called mucus. Mucus drains out of your sinuses. Swelling can trap mucus in your sinuses. This lets germs (bacteria, virus, or fungus) grow, which leads to infection. Most of the time, this condition is caused by a virus. What are the causes? This condition is caused by: Allergies. Asthma. Germs. Things that block your nose or sinuses. Growths in the nose (nasal polyps). Chemicals or irritants in the air. Fungus (rare). What increases the  risk? You are more likely to develop this condition if: You have a weak body defense system (immune system). You do a lot of swimming or diving. You use nasal sprays too much. You smoke. What are the signs or symptoms? The main symptoms of this condition are pain and a feeling of pressure around the sinuses. Other symptoms include: Stuffy nose (congestion). Runny nose (drainage). Swelling and warmth in the sinuses. Headache. Toothache. A cough that may get worse at night. Mucus that collects in the throat or the back of the nose (postnasal drip). Being unable to smell and taste. Being very tired (fatigue). A fever. Sore throat. Bad breath. How is this diagnosed? This condition is diagnosed based on: Your symptoms. Your medical history. A physical exam. Tests to find out if your condition is short-term (acute) or long-term (chronic). Your doctor may: Check your nose for growths (polyps). Check your sinuses using a tool that has a light (endoscope). Check for allergies or germs. Do imaging tests, such as an MRI or CT scan. How is this treated? Treatment for this condition depends on the cause and whether it is short-term or long-term. If caused by a virus, your symptoms should go away on their own within 10 days. You may be given medicines to relieve symptoms. They include: Medicines that shrink swollen tissue in the nose. Medicines that treat allergies (antihistamines). A spray that treats swelling of the nostrils.  Rinses that help get rid of thick mucus in your nose (nasal saline washes). If caused by bacteria, your doctor may wait to see if you will get better without treatment. You may be given antibiotic medicine if you have: A very bad infection. A weak body defense system. If caused by growths in the nose, you may need to have surgery. Follow these instructions at home: Medicines Take, use, or apply over-the-counter and prescription medicines only as told by your  doctor. These may include nasal sprays. If you were prescribed an antibiotic medicine, take it as told by your doctor. Do not stop taking the antibiotic even if you start to feel better. Hydrate and humidify  Drink enough water to keep your pee (urine) pale yellow. Use a cool mist humidifier to keep the humidity level in your home above 50%. Breathe in steam for 10-15 minutes, 3-4 times a day, or as told by your doctor. You can do this in the bathroom while a hot shower is running. Try not to spend time in cool or dry air. Rest Rest as much as you can. Sleep with your head raised (elevated). Make sure you get enough sleep each night. General instructions  Put a warm, moist washcloth on your face 3-4 times a day, or as often as told by your doctor. This will help with discomfort. Wash your hands often with  soap and water. If there is no soap and water, use hand sanitizer. Do not smoke. Avoid being around people who are smoking (secondhand smoke). Keep all follow-up visits as told by your doctor. This is important. Contact a doctor if: You have a fever. Your symptoms get worse. Your symptoms do not get better within 10 days. Get help right away if: You have a very bad headache. You cannot stop throwing up (vomiting). You have very bad pain or swelling around your face or eyes. You have trouble seeing. You feel confused. Your neck is stiff. You have trouble breathing. Summary Sinusitis is swelling of your sinuses. Sinuses are hollow spaces in the bones around your face. This condition is caused by tissues in your nose that become inflamed or swollen. This traps germs. These can lead to infection. If you were prescribed an antibiotic medicine, take it as told by your doctor. Do not stop taking it even if you start to feel better. Keep all follow-up visits as told by your doctor. This is important. This information is not intended to replace advice given to you by your health care  provider. Make sure you discuss any questions you have with your health care provider. Document Revised: 05/04/2018 Document Reviewed: 05/04/2018 Elsevier Patient Education  2022 [employer].    If you have been instructed to have an in-person evaluation today at a local Urgent Care facility, please use the link below. It will take you to a list of all of our available [facility], including address, phone number and hours of operation. Please do not delay care.  [facility]  If you or a family member do not have a primary care provider, use the link below to schedule a visit and establish care. When you choose a [facility] primary care physician or advanced practice provider, you gain a long-term partner in health. Find a Primary Care Provider  Learn more about [facility]'s in-office and virtual care options: [facility]

## 2021-09-05 ENCOUNTER — Encounter: Payer: Self-pay | Admitting: Obstetrics and Gynecology

## 2021-09-05 ENCOUNTER — Other Ambulatory Visit: Payer: Self-pay

## 2021-09-05 ENCOUNTER — Ambulatory Visit (INDEPENDENT_AMBULATORY_CARE_PROVIDER_SITE_OTHER): Payer: BC Managed Care – PPO | Admitting: Obstetrics and Gynecology

## 2021-09-05 VITALS — BP 119/74 | Ht 66.0 in | Wt 226.8 lb

## 2021-09-05 DIAGNOSIS — Z01419 Encounter for gynecological examination (general) (routine) without abnormal findings: Secondary | ICD-10-CM

## 2021-09-05 DIAGNOSIS — Z124 Encounter for screening for malignant neoplasm of cervix: Secondary | ICD-10-CM

## 2021-09-05 DIAGNOSIS — Z803 Family history of malignant neoplasm of breast: Secondary | ICD-10-CM | POA: Diagnosis not present

## 2021-09-05 DIAGNOSIS — R875 Abnormal microbiological findings in specimens from female genital organs: Secondary | ICD-10-CM | POA: Diagnosis not present

## 2021-09-05 DIAGNOSIS — Z0000 Encounter for general adult medical examination without abnormal findings: Secondary | ICD-10-CM | POA: Diagnosis not present

## 2021-09-05 DIAGNOSIS — N941 Unspecified dyspareunia: Secondary | ICD-10-CM | POA: Diagnosis not present

## 2021-09-05 DIAGNOSIS — R102 Pelvic and perineal pain: Secondary | ICD-10-CM | POA: Diagnosis not present

## 2021-09-05 DIAGNOSIS — E569 Vitamin deficiency, unspecified: Secondary | ICD-10-CM | POA: Diagnosis not present

## 2021-09-05 DIAGNOSIS — Z9189 Other specified personal risk factors, not elsewhere classified: Secondary | ICD-10-CM

## 2021-09-05 DIAGNOSIS — Z1239 Encounter for other screening for malignant neoplasm of breast: Secondary | ICD-10-CM | POA: Diagnosis not present

## 2021-09-05 NOTE — Patient Instructions (Signed)
[employer] Recommended Dietary Allowances for Calcium and Vitamin D  Age (yr) Calcium Recommended Dietary Allowance (mg/day) Vitamin D Recommended Dietary Allowance (international units/day)  9-18 1,300 600  19-50 1,000 600  51-70 1,200 600  71 and older 1,200 800  Data from [employer]. Dietary reference intakes: calcium, vitamin D. [place], [place]: [employer]ies [employer]; 2011.   Exercising to Stay Healthy To become healthy and stay healthy, it is recommended that you do moderate-intensity and vigorous-intensity exercise. You can tell that you are exercising at a moderate intensity if your heart starts beating faster and you start breathing faster but can still hold a conversation. You can tell that you are exercising at a vigorous intensity if you are breathing much harder and faster and cannot hold a conversation while exercising. How can exercise benefit me? Exercising regularly is important. It has many health benefits, such as: Improving overall fitness, flexibility, and endurance. Increasing bone density. Helping with weight control. Decreasing body fat. Increasing muscle strength and endurance. Reducing stress and tension, anxiety, depression, or anger. Improving overall health. What guidelines should I follow while exercising? Before you start a new exercise program, talk with your health care provider. Do not exercise so much that you hurt yourself, feel dizzy, or get very short of breath. Wear comfortable clothes and wear shoes with good support. Drink plenty of water while you exercise to prevent dehydration or heat stroke. Work out until your breathing and your heartbeat get faster (moderate intensity). How often should I exercise? Choose an activity that you enjoy, and set realistic goals. Your health care provider can help you make an activity plan that is individually designed and works best for you. Exercise regularly as told by your health  care provider. This may include: Doing strength training two times a week, such as: Lifting weights. Using resistance bands. Push-ups. Sit-ups. Yoga. Doing a certain intensity of exercise for a given amount of time. Choose from these options: A total of 150 minutes of moderate-intensity exercise every week. A total of 75 minutes of vigorous-intensity exercise every week. A mix of moderate-intensity and vigorous-intensity exercise every week. Children, pregnant women, people who have not exercised regularly, people who are overweight, and older adults may need to talk with a health care provider about what activities are safe to perform. If you have a medical condition, be sure to talk with your health care provider before you start a new exercise program. What are some exercise ideas? Moderate-intensity exercise ideas include: Walking 1 mile (1.6 km) in about 15 minutes. Biking. Hiking. Golfing. Dancing. Water aerobics. Vigorous-intensity exercise ideas include: Walking 4.5 miles (7.2 km) or more in about 1 hour. Jogging or running 5 miles (8 km) in about 1 hour. Biking 10 miles (16.1 km) or more in about 1 hour. Lap swimming. Roller-skating or in-line skating. Cross-country skiing. Vigorous competitive sports, such as football, basketball, and soccer. Jumping rope. Aerobic dancing. What are some everyday activities that can help me get exercise? Yard work, such as: Pushing a lawn mower. Raking and bagging leaves. Washing your car. Pushing a stroller. Shoveling snow. Gardening. Washing windows or floors. How can I be more active in my day-to-day activities? Use stairs instead of an elevator. Take a walk during your lunch break. If you drive, park your car farther away from your work or school. If you take public transportation, get off one stop early and walk the rest of the way. Stand up or walk around during all of   your indoor phone calls. Get up, stretch, and walk  around every 30 minutes throughout the day. Enjoy exercise with a friend. Support to continue exercising will help you keep a regular routine of activity. Where to find more information You can find more information about exercising to stay healthy from: [place] Department of Health and Human Services: www.hhs.gov Centers for Disease Control and Prevention (CDC): www.cdc.gov Summary Exercising regularly is important. It will improve your overall fitness, flexibility, and endurance. Regular exercise will also improve your overall health. It can help you control your weight, reduce stress, and improve your bone density. Do not exercise so much that you hurt yourself, feel dizzy, or get very short of breath. Before you start a new exercise program, talk with your health care provider. This information is not intended to replace advice given to you by your health care provider. Make sure you discuss any questions you have with your health care provider. Document Revised: 03/30/2021 Document Reviewed: 03/30/2021 Elsevier Patient Education  2022 [employer]. Budget-Friendly Healthy Eating There are many ways to save money at the grocery store and continue to eat healthy. You can be successful if you: Plan meals according to your budget. Make a grocery list and only purchase food according to your grocery list. Prepare food yourself at home. What are tips for following this plan? Reading food labels Compare food labels between brand name foods and the store brand. Often the nutritional value is the same, but the store brand is lower cost. Look for products that do not have added sugar, fat, or salt (sodium). These often cost the same but are healthier for you. Products may be labeled as: Sugar-free. Nonfat. Low-fat. Sodium-free. Low-sodium. Look for lean ground beef labeled as at least 92% lean and 8% fat. Shopping  Buy only the items on your grocery list and go only to the areas of the store  that have the items on your list. Use coupons only for foods and brands you normally buy. Avoid buying items you wouldn't normally buy simply because they are on sale. Check online and in newspapers for weekly deals. Buy healthy items from the bulk bins when available, such as herbs, spices, flour, pasta, nuts, and dried fruit. Buy fruits and vegetables that are in season. Prices are usually lower on in-season produce. Look at the unit price on the price tag. Use it to compare different brands and sizes to find out which item is the best deal. Choose healthy items that are often low-cost, such as carrots, potatoes, apples, bananas, and oranges. Dried or canned beans are a low-cost protein source. Buy in bulk and freeze extra food. Items you can buy in bulk include meats, fish, poultry, frozen fruits, and frozen vegetables. Avoid buying "ready-to-eat" foods, such as pre-cut fruits and vegetables and pre-made salads. If possible, shop around to discover where you can find the best prices. Consider other retailers such as dollar stores, larger wholesale stores, local fruit and vegetable stands, and farmers markets. Do not shop when you are hungry. If you shop while hungry, it may be hard to stick to your list and budget. Resist impulse buying. Use your grocery list as your official plan for the week. Buy a variety of vegetables and fruits by purchasing fresh, frozen, and canned items. Look at the top and bottom shelves for deals. Foods at eye level (eye level of an adult or child) are usually more expensive. Be efficient with your time when shopping. The more time you   spend at the store, the more money you are likely to spend. To save money when choosing more expensive foods like meats and dairy: Choose cheaper cuts of meat, such as bone-in chicken thighs and drumsticks instead of skinless and boneless chicken. When you are ready to prepare the chicken, you can remove the skin yourself to make it  healthier. Choose lean meats like chicken or turkey instead of beef. Choose canned seafood, such as tuna, salmon, or sardines. Buy eggs as a low-cost source of protein. Buy dried beans and peas, such as lentils, split peas, or kidney beans instead of meats. Dried beans and peas are a good alternative source of protein. Buy the larger tubs of yogurt instead of individual-sized containers. Choose water instead of sodas and other sweetened beverages. Avoid buying chips, cookies, and other "junk food." These items are usually expensive and not healthy. Cooking Make extra food and freeze the extras in meal-sized containers or in individual portions for fast meals and snacks. Pre-cook on days when you have extra time to prepare meals in advance. You can keep these meals in the fridge or freezer and reheat for a quick meal. When you come home from the grocery store, wash, peel, and cut fruits and vegetables so they are ready to use and eat. This will help reduce food waste. Meal planning Do not eat out or get fast food. Prepare food at home. Make a grocery list and make sure to bring it with you to the store. If you have a smart phone, you could use your phone to create your shopping list. Plan meals and snacks according to a grocery list and budget you create. Use leftovers in your meal plan for the week. Look for recipes where you can cook once and make enough food for two meals. Prepare budget-friendly types of meals like stews, casseroles, and stir-fry dishes. Try some meatless meals or try "no cook" meals like salads. Make sure that half your plate is filled with fruits or vegetables. Choose from fresh, frozen, or canned fruits and vegetables. If eating canned, remember to rinse them before eating. This will remove any excess salt added for packaging. Summary Eating healthy on a budget is possible if you plan your meals according to your budget, purchase according to your budget and grocery list,  and prepare food yourself. Tips for buying more food on a limited budget include buying generic brands, using coupons only for foods you normally buy, and buying healthy items from the bulk bins when available. Tips for buying cheaper food to replace expensive food include choosing cheaper, lean cuts of meat, and buying dried beans and peas. This information is not intended to replace advice given to you by your health care provider. Make sure you discuss any questions you have with your health care provider. Document Revised: 09/14/2020 Document Reviewed: 09/14/2020 Elsevier Patient Education  2022 Else[employer] Inc. Bone Health Bones protect organs, store calcium, anchor muscles, and support the whole body. Keeping your bones strong is important, especially as you get older. You can take actions to help keep your bones strong and healthy. Why is keeping my bones healthy important? Keeping your bones healthy is important because your body constantly replaces bone cells. Cells get old, and new cells take their place. As we age, we lose bone cells because the body may not be able to make enough new cells to replace the old cells. The amount of bone cells and bone tissue you have is referred to as   bone mass. The higher your bone mass, the stronger your bones. The aging process leads to an overall loss of bone mass in the body, which can increase the likelihood of: Joint pain and stiffness. Broken bones. A condition in which the bones become weak and brittle (osteoporosis). A large decline in bone mass occurs in older adults. In women, it occurs about the time of menopause. What actions can I take to keep my bones healthy? Good health habits are important for maintaining healthy bones. This includes eating nutritious foods and exercising regularly. To have healthy bones, you need to get enough of the right minerals and vitamins. Most nutrition experts recommend getting these nutrients from the foods that  you eat. In some cases, taking supplements may also be recommended. Doing certain types of exercise is also important for bone health. What are the nutritional recommendations for healthy bones? Eating a well-balanced diet with plenty of calcium and vitamin D will help to protect your bones. Nutritional recommendations vary from person to person. Ask your health care provider what is healthy for you. Here are some general guidelines. Get enough calcium Calcium is the most important (essential) mineral for bone health. Most people can get enough calcium from their diet, but supplements may be recommended for people who are at risk for osteoporosis. Good sources of calcium include: Dairy products, such as low-fat or nonfat milk, cheese, and yogurt. Dark green leafy vegetables, such as bok choy and broccoli. Calcium-fortified foods, such as orange juice, cereal, bread, soy beverages, and tofu products. Nuts, such as almonds. Follow these recommended amounts for daily calcium intake: Children, age 93-3: 700 mg. Children, age 42-8: 1,000 mg. Children, age 422-13: 1,300 mg. Teens, age 931-18: 1,300 mg. Adults, age 28-50: 1,000 mg. Adults, age 58-70: Men: 1,000 mg. Women: 1,200 mg. Adults, age 34 or older: 1,200 mg. Pregnant and breastfeeding females: Teens: 1,300 mg. Adults: 1,000 mg. Get enough vitamin D Vitamin D is the most essential vitamin for bone health. It helps the body absorb calcium. Sunlight stimulates the skin to make vitamin D, so be sure to get enough sunlight. If you live in a cold climate or you do not get outside often, your health care provider may recommend that you take vitamin D supplements. Good sources of vitamin D in your diet include: Egg yolks. Saltwater fish. Milk and cereal fortified with vitamin D. Follow these recommended amounts for daily vitamin D intake: Children and teens, age 93-18: 600 international units. Adults, age 50 or younger: 400-800 international  units. Adults, age 78 or older: 800-1,000 international units. Get other important nutrients Other nutrients that are important for bone health include: Phosphorus. This mineral is found in meat, poultry, dairy foods, nuts, and legumes. The recommended daily intake for adult men and adult women is 700 mg. Magnesium. This mineral is found in seeds, nuts, dark green vegetables, and legumes. The recommended daily intake for adult men is 400-420 mg. For adult women, it is 310-320 mg. Vitamin K. This vitamin is found in green leafy vegetables. The recommended daily intake is 120 mg for adult men and 90 mg for adult women. What type of physical activity is best for building and maintaining healthy bones? Weight-bearing and strength-building activities are important for building and maintaining healthy bones. Weight-bearing activities cause muscles and bones to work against gravity. Strength-building activities increase the strength of the muscles that support bones. Weight-bearing and muscle-building activities include: Walking and hiking. Jogging and running. Dancing. Gym exercises. Lifting weights. Tennis  and racquetball. Climbing stairs. Aerobics. Adults should get at least 30 minutes of moderate physical activity on most days. Children should get at least 60 minutes of moderate physical activity on most days. Ask your health care provider what type of exercise is best for you. How can I find out if my bone mass is low? Bone mass can be measured with an X-ray test called a bone mineral density (BMD) test. This test is recommended for all women who are age 18 or older. It may also be recommended for: Men who are age 71 or older. People who are at risk for osteoporosis because of: Having bones that break easily. Having a long-term disease that weakens bones, such as kidney disease or rheumatoid arthritis. Having menopause earlier than normal. Taking medicine that weakens bones, such as steroids,  thyroid hormones, or hormone treatment for breast cancer or prostate cancer. Smoking. Drinking three or more alcoholic drinks a day. If you find that you have a low bone mass, you may be able to prevent osteoporosis or further bone loss by changing your diet and lifestyle. Where can I find more information? For more information, check out the following websites: [place]: [employer] of Health: www.bones.[employer]Exposed.es International Osteoporosis Foundation: Administrator.iofbonehealth.org Summary The aging process leads to an overall loss of bone mass in the body, which can increase the likelihood of broken bones and osteoporosis. Eating a well-balanced diet with plenty of calcium and vitamin D will help to protect your bones. Weight-bearing and strength-building activities are also important for building and maintaining strong bones. Bone mass can be measured with an X-ray test called a bone mineral density (BMD) test. This information is not intended to replace advice given to you by your health care provider. Make sure you discuss any questions you have with your health care provider. Document Revised: 12/29/2017 Document Reviewed: 12/29/2017 Elsevier Patient Education  2022 [employer].

## 2021-09-05 NOTE — Progress Notes (Signed)
Gynecology Annual Exam  PCP: McLean-Scocuzza, Nino Glow, MD  Chief Complaint:  Chief Complaint  Patient presents with   Gynecologic Exam    History of Present Illness: Patient is a 35 y.o. [id number] presents for annual exam. The patient has no complaints today.   LMP: Patient's last menstrual period was 02/15/2019.  Patient has had a hysterectomy.  She denies any vaginal bleeding.  The patient does perform self breast exams.  There is notable family history of breast or ovarian cancer in her family.  The patient reports her exercise generally consists of walking 1-2 days a week .  The patient reports current symptoms of depression.  She reports that her depression anxiety was worse about 1 month ago.  However she recently restarted her Zoloft medication and is already noticing a marked improvement.  She was given a PHQ-9 and GAD-7 although she declined to complete those today.  Review of Systems: Review of Systems  Constitutional:  Negative for chills, fever, malaise/fatigue and weight loss.  HENT:  Negative for congestion, hearing loss and sinus pain.   Eyes:  Negative for blurred vision and double vision.  Respiratory:  Negative for cough, sputum production, shortness of breath and wheezing.   Cardiovascular:  Negative for chest pain, palpitations, orthopnea and leg swelling.  Gastrointestinal:  Negative for abdominal pain, constipation, diarrhea, nausea and vomiting.  Genitourinary:  Negative for dysuria, flank pain, frequency, hematuria and urgency.  Musculoskeletal:  Negative for back pain, falls and joint pain.  Skin:  Negative for itching and rash.  Neurological:  Positive for headaches. Negative for dizziness.  Endo/Heme/Allergies:  Positive for environmental allergies.  Psychiatric/Behavioral:  Positive for depression. Negative for substance abuse and suicidal ideas. The patient is nervous/anxious.    Past Medical History:  Past Medical History:  Diagnosis Date    Anxiety    BRCA negative    MyRisk update neg 5/22   Depression    Family history of anesthesia complication     mother had Post -op nausea and dizziness.   Family history of breast cancer    Fibula fracture    distal end f/u [facility] spring 2022 after fall    Foot drop, left foot    GERD (gastroesophageal reflux disease)    Headache    migraines   History of kidney stones    Hypertension    better since gastric sleeve.  no meds.   Increased risk of breast cancer 04/2021   IBIS=20.3%/riskscore=35.6%   Kidney stone    PONV (postoperative nausea and vomiting)    Pseudotumor cerebri 2007 or 2009    Past Surgical History:  Past Surgical History:  Procedure Laterality Date   ABDOMINAL HYSTERECTOMY     CESAREAN SECTION     2009/2017   CHOLECYSTECTOMY N/A 02/04/2019   Procedure: LAPAROSCOPIC CHOLECYSTECTOMY;  Surgeon: Fredirick Maudlin, MD;  Location: [facility];  Service: General;  Laterality: N/A;   LAPAROSCOPIC GASTRIC SLEEVE RESECTION N/A 11/14/2020   Procedure: LAPAROSCOPIC GASTRIC SLEEVE RESECTION;  Surgeon: [name], MD;  Location: [facility];  Service: General;  Laterality: N/A;   LUMBAR LAMINECTOMY/DECOMPRESSION MICRODISCECTOMY Left 09/22/2014   Procedure: Left Lumbar four-five microdiskectomy;  Surgeon: Consuella Lose, MD;  Location: [facility];  Service: Neurosurgery;  Laterality: Left;  Left Lumbar four-five microdiskectomy   ORIF ANKLE FRACTURE Left 03/23/2021   Procedure: OPEN REDUCTION INTERNAL FIXATION (ORIF) Left lateral malleolus with syndesmosis repair;  Surgeon: Leim Fabry, MD;  Location: [place];  Service:  Orthopedics;  Laterality: Left;   UPPER GI ENDOSCOPY N/A 11/14/2020   Procedure: UPPER GI ENDOSCOPY;  Surgeon: [name], MD;  Location: [facility];  Service: General;  Laterality: N/A;   WISDOM TOOTH EXTRACTION      Gynecologic History:  Patient's last menstrual period was 02/15/2019. Menarche: Third grade  History of fibroids,  polyps, or ovarian cysts? : no  History of PCOS? no Hstory of Endometriosis? no History of abnormal pap smears? no Have you had any sexually transmitted infections in the past? no  Last Pap: Results were: 2019 NIL   She identifies as a female. She is sexually active with men.   She has dyspareunia. She denies postcoital bleeding.  She currently uses status post hysterectomy for contraception.   She reports that she generally has pain with initial prescription during intercourse.  She has tried over-the-counter lubricants without any success.  Pain seems to be present irregardless of lubrication.  She has been able to use tampons in the past successfully without issue.  Obstetric History: [id number]  Family History:  Family History  Problem Relation Age of Onset   Cancer Mother        breast dx'ed age 41/49   Hypertension Mother    Diabetes Mother    Hypothyroidism Mother    Depression Mother    Breast cancer Mother        68s   Liver disease Father    Heart attack Father    Arthritis Father    Alcohol abuse Father    Depression Father    Drug abuse Father    Early death Father    Heart disease Father    Hypertension Father    Hepatitis C Father    Depression Sister    Hypertension Sister    Arthritis Sister    Hypertension Sister    Cancer Maternal Grandmother        stomach>liver>brain met   Cancer Maternal Uncle        lung not a smoker     Social History:  Social History   Socioeconomic History   Marital status: Married    Spouse name: Not on file   Number of children: 2   Years of education: Not on file   Highest education level: Not on file  Occupational History   Not on file  Tobacco Use   Smoking status: Former    Packs/day: 0.50    Types: Cigarettes    Quit date: 03/2019    Years since quitting: 2.4   Smokeless tobacco: Never  Vaping Use   Vaping Use: Never used  Substance and Sexual Activity   Alcohol use: Not Currently   Drug use: No    Sexual activity: Yes    Birth control/protection: Rhythm  Other Topics Concern   Not on file  Social History Narrative   High school    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    Married with kids     Owns guns   Wears seat belt   Safe in relationship    Social Determinants of Radio broadcast assistant Strain: Not on file  Food Insecurity: Not on file  Transportation Needs: Not on file  Physical Activity: Not on file  Stress: Not on file  Social Connections: Not on file  Intimate Partner Violence: Not on file    Allergies:  Allergies  Allergen Reactions   Apple Other (See Comments)    Lips itch and  swell, inside of mouth gets hives   Carrot [Daucus Carota] Other (See Comments)    Lips itch and swell, inside of mouth gets hives   Monistat [Miconazole] Swelling    Severe vaginal itching and swelling   Other Itching and Swelling    Almonds-mouth swells and itches   Almond Oil Hives   Amoxicillin Hives, Itching, Swelling and Dermatitis    Did it involve swelling of the face/tongue/throat, SOB, or low BP? No Did it involve sudden or severe rash/hives, skin peeling, or any reaction on the inside of your mouth or nose? Yes Did you need to seek medical attention at a hospital or doctor's office? Yes When did it last happen?    within the past 5 years If all above answers are "NO", may proceed with cephalosporin use.    Imitrex [Sumatriptan]     Felt like heart attack    Topamax [Topiramate]     Memory loss      Medications: Prior to Admission medications   Medication Sig Start Date End Date Taking? Authorizing Provider  AIMOVIG 140 MG/ML SOAJ Take 140 mg by mouth every 30 (thirty) days. 06/26/20  Yes [provider]  ALPRAZolam (XANAX) 0.25 MG tablet Take 1 tablet (0.25 mg total) by mouth 2 (two) times daily as needed for anxiety. 08/01/21  Yes Dutch Quint B, FNP  Biotin w/ Vitamins C & E (HAIR/SKIN/NAILS PO) Take by mouth daily.   Yes [provider]  busPIRone (BUSPAR) 5 MG tablet Take 1 tablet (5 mg total) by mouth 3 (three) times daily. 08/16/21  Yes Mar Daring, PA-C  CALCIUM CITRATE PO Take by mouth.   Yes [provider]  cyanocobalamin (,VITAMIN B-12,) 1000 MCG/ML injection Inject 1 mL (1,000 mcg total) into the muscle every 14 (fourteen) days. 06/20/21  Yes McLean-Scocuzza, Nino Glow, MD  Multiple Vitamins-Minerals (MULTIVITAMIN WITH MINERALS) tablet Take 1 tablet by mouth daily. Bariatric advantage one a day   Yes [provider]  NEEDLE, DISP, 25 G 25G X 1-1/2" MISC 1 Device by Does not apply route every 14 (fourteen) days. 06/20/21  Yes McLean-Scocuzza, Nino Glow, MD  OVER THE COUNTER MEDICATION Take 1 tablet by mouth at bedtime. Tranquil Sleep   Yes [provider]  pantoprazole (PROTONIX) 40 MG tablet TAKE 1 TABLET BY MOUTH DAILY 11/15/20 11/15/21 Yes [name], MD  Rimegepant Sulfate (NURTEC PO) Take 75 mg by mouth. Once daily prn abortive for migraines   Yes [provider]  sertraline (ZOLOFT) 100 MG tablet TAKE 1 TABLET BY MOUTH EVERY DAY 05/01/21  Yes McLean-Scocuzza, Nino Glow, MD  cefdinir (OMNICEF) 300 MG capsule Take 1 capsule (300 mg total) by mouth 2 (two) times daily. Patient not taking: Reported on 09/05/2021 08/24/21   Mar Daring, PA-C  cyclobenzaprine (FLEXERIL) 5 MG tablet Take 1-2 tablets (5-10 mg total) by mouth at bedtime as needed for muscle spasms. Patient not taking: Reported on 09/05/2021 02/09/21   McLean-Scocuzza, Nino Glow, MD  doxycycline (VIBRA-TABS) 100 MG tablet Take 1 tablet (100 mg total) by mouth 2 (two) times daily. Patient not taking: Reported on 09/05/2021 08/16/21   Mar Daring, PA-C  predniSONE (DELTASONE) 20 MG tablet Take 2 tablets (40 mg total) by mouth daily with breakfast. Patient not taking: Reported on 09/05/2021 08/24/21   Mar Daring, PA-C  gabapentin (NEURONTIN) 100 MG capsule Take 2 capsules (200 mg total) by mouth  every 12 (twelve) hours. 11/15/20 11/24/20  Romana Juniper  A, MD    Physical Exam Vitals: Blood pressure 119/74, height _0  (1.676 m), weight 226 lb 12.8 oz (102.9 kg), last menstrual period 02/15/2019.  Physical Exam Constitutional:      Appearance: She is well-developed.  Genitourinary:     Genitourinary Comments: External: Normal appearing vulva. No lesions noted.  Patient is tender to light touch with a Q-tip along the hymenal ring.  No vestibule tenderness.  Bimanual examination: Uterus absent, Intact vaginal cuff,. No adnexal masses. No adnexal tenderness. Pelvis not fixed.  Breast Exam: breast equal without skin changes, nipple discharge, breast lump or enlarged lymph nodes   HENT:     Head: Normocephalic and atraumatic.  Neck:     Thyroid: No thyromegaly.  Cardiovascular:     Rate and Rhythm: Normal rate and regular rhythm.     Heart sounds: Normal heart sounds.  Pulmonary:     Effort: Pulmonary effort is normal.     Breath sounds: Normal breath sounds.  Abdominal:     General: Bowel sounds are normal. There is no distension.     Palpations: Abdomen is soft. There is no mass.  Musculoskeletal:     Cervical back: Neck supple.  Neurological:     Mental Status: She is alert and oriented to person, place, and time.  Skin:    General: Skin is warm and dry.  Psychiatric:        Behavior: Behavior normal.        Thought Content: Thought content normal.        Judgment: Judgment normal.  Vitals reviewed.     Female chaperone present for pelvic and breast  portions of the physical exam  Assessment: 35 y.o. [id number] routine annual exam  Plan: Problem List Items Addressed This Visit   None Visit Diagnoses     Encounter for annual routine gynecological examination    -  Primary   Family history of breast cancer       Health maintenance examination       Encounter for gynecological examination without abnormal finding       Encounter for screening breast  examination       Cervical cancer screening       Increased risk of breast cancer       Relevant Orders   MR BREAST BILATERAL W CONTRAST   Vitamin deficiency       Relevant Orders   B12   Vitamin D (25 hydroxy)   Vulvar pain       Relevant Orders   NuSwab Vaginitis Plus (VG+)   Dyspareunia in female       Relevant Orders   NuSwab Vaginitis Plus (VG+)   Abnormal microbiological finding in specimen from female genital organ       Relevant Orders   NuSwab Vaginitis Plus (VG+)       1) STI screening was offered and accepted. Nuswab collected to also evaluate for cause of vulvodynia.   2) Cervical cancer screening discontinued secondary to status post hysterectomy with no history of CIN-2 or greater lesions.  3) Contraception -not needed  4) Routine healthcare maintenance including cholesterol, diabetes screening discussed managed by PCP  5) Dyspareunia-with suspected vulvodynia based off of patient's described symptoms.  New swab testing today.  Discussed management options with lidocaine.  Patient is not interested in at this time.  6) Patient has a increased lifetime risk of breast cancer of 35%.  Breast MRI and mammogram are ordered today.  Adisyn Ruscitti  MD, [facility], [facility] 09/05/2021 9:46 AM

## 2021-09-06 LAB — VITAMIN D 25 HYDROXY (VIT D DEFICIENCY, FRACTURES): Vit D, 25-Hydroxy: 28.6 ng/mL — ABNORMAL LOW (ref 30.0–100.0)

## 2021-09-06 LAB — VITAMIN B12: Vitamin B-12: 566 pg/mL (ref 232–1245)

## 2021-09-07 ENCOUNTER — Telehealth: Payer: Self-pay

## 2021-09-07 ENCOUNTER — Encounter: Payer: Self-pay | Admitting: General Surgery

## 2021-09-07 NOTE — Telephone Encounter (Signed)
Pt calling to discuss labs and other test c CRS; are the results back?  [phone]  Courtesy call to pt to let her know CRS is not in the office today; msg will be forwarded to her; to keep eye out in [place] for comments from [place] about labs; pt aware swab isn't back.

## 2021-09-08 LAB — NUSWAB VAGINITIS PLUS (VG+)
Candida albicans, NAA: NEGATIVE
Candida glabrata, NAA: NEGATIVE
Chlamydia trachomatis, NAA: NEGATIVE
Neisseria gonorrhoeae, NAA: NEGATIVE
Trich vag by NAA: NEGATIVE

## 2021-09-24 DIAGNOSIS — G43009 Migraine without aura, not intractable, without status migrainosus: Secondary | ICD-10-CM | POA: Diagnosis not present

## 2021-09-24 DIAGNOSIS — G932 Benign intracranial hypertension: Secondary | ICD-10-CM | POA: Diagnosis not present

## 2021-09-25 ENCOUNTER — Ambulatory Visit: Payer: BC Managed Care – PPO | Admitting: Family

## 2021-09-25 DIAGNOSIS — M25572 Pain in left ankle and joints of left foot: Secondary | ICD-10-CM | POA: Diagnosis not present

## 2021-09-25 DIAGNOSIS — Z8781 Personal history of (healed) traumatic fracture: Secondary | ICD-10-CM | POA: Diagnosis not present

## 2021-09-25 DIAGNOSIS — Z9889 Other specified postprocedural states: Secondary | ICD-10-CM | POA: Diagnosis not present

## 2021-10-01 ENCOUNTER — Encounter: Payer: Self-pay | Admitting: Internal Medicine

## 2021-10-19 ENCOUNTER — Telehealth: Payer: BC Managed Care – PPO | Admitting: Physician Assistant

## 2021-10-19 DIAGNOSIS — B029 Zoster without complications: Secondary | ICD-10-CM

## 2021-10-19 MED ORDER — VALACYCLOVIR HCL 1 G PO TABS
1000.0000 mg | ORAL_TABLET | Freq: Three times a day (TID) | ORAL | 0 refills | Status: AC
Start: 2021-10-19 — End: 2021-10-29

## 2021-10-19 NOTE — Progress Notes (Signed)
Virtual Visit Consent   Debbie Townsend, you are scheduled for a virtual visit with a [facility] provider today.     Just as with appointments in the office, your consent must be obtained to participate.  Your consent will be active for this visit and any virtual visit you may have with one of our providers in the next 365 days.     If you have a MyChart account, a copy of this consent can be sent to you electronically.  All virtual visits are billed to your insurance company just like a traditional visit in the office.    As this is a virtual visit, video technology does not allow for your provider to perform a traditional examination.  This may limit your provider's ability to fully assess your condition.  If your provider identifies any concerns that need to be evaluated in person or the need to arrange testing (such as labs, EKG, etc.), we will make arrangements to do so.     Although advances in technology are sophisticated, we cannot ensure that it will always work on either your end or our end.  If the connection with a video visit is poor, the visit may have to be switched to a telephone visit.  With either a video or telephone visit, we are not always able to ensure that we have a secure connection.     I need to obtain your verbal consent now.   Are you willing to proceed with your visit today?    Debbie Townsend has provided verbal consent on 10/19/2021 for a virtual visit (video or telephone).   Leeanne Rio, [place]   Date: 10/19/2021 3:16 PM   Virtual Visit via Video Note   I, Leeanne Rio, connected with  Debbie Townsend  ([id number], Jan 25, 1986) on 10/19/21 at  3:00 PM EDT by a video-enabled telemedicine application and verified that I am speaking with the correct person using two identifiers.  Location: Patient: Virtual Visit Location Patient: Home Provider: Virtual Visit Location Provider: Home Office   I discussed the limitations of evaluation and  management by telemedicine and the availability of in person appointments. The patient expressed understanding and agreed to proceed.    History of Present Illness: Debbie Townsend is a 35 y.o. who identifies as a female who was assigned female at birth, and is being seen today for rash of L abdomen.  Is pruritic and painful. Is of Left abdomen moving around her side. Is blistering in some areas. None on the R side of abdomen. Thought initially an allergic reaction but no change in soaps/lotions/etc and no known exposure to plant allergens. Has been using OTC hydrocortisone and benadryl cream without much improvement. Noted new area yesterday.   HPI: HPI  Problems:  Patient Active Problem List   Diagnosis Date Noted   Obesity (BMI 30-39.9) 05/07/2021   Iron deficiency 11/03/2020   Hair loss 07/03/2020   Prediabetes 07/03/2020   Depression, recurrent ([place]) 07/03/2020   Intractable migraine without aura and without status migrainosus 04/04/2020   Gastroesophageal reflux disease without esophagitis 03/29/2020   Chronic midline low back pain 02/13/2020   CSF leak from nose 12/07/2019   Other optic atrophy, bilateral 12/07/2019   IIH (idiopathic intracranial hypertension) 12/07/2019   Annual physical exam 09/24/2019   Insomnia 09/24/2019   Fatigue 09/24/2019   History of kidney stones 09/24/2019   Allergic rhinitis 03/24/2019   Status post laparoscopic hysterectomy 02/12/2019   Adenomyosis 02/04/2019  DUB (dysfunctional uterine bleeding) 09/10/2018   Fatty liver 04/02/2018   Gallstones 04/02/2018   Heavy menses 04/02/2018   FH: breast cancer in first degree relative 04/02/2018   Anxiety 04/02/2018   Anxiety and depression 12/16/2016   Cesarean delivery delivered 03/22/2016   Rubella non-immune status, antepartum 01/08/2016   Essential hypertension 11/13/2015   Pseudotumor cerebri 11/13/2015   HNP (herniated nucleus pulposus), lumbar 09/22/2014    Allergies:  Allergies  Allergen  Reactions   Apple Other (See Comments)    Lips itch and swell, inside of mouth gets hives   Carrot [Daucus Carota] Other (See Comments)    Lips itch and swell, inside of mouth gets hives   Monistat [Miconazole] Swelling    Severe vaginal itching and swelling   Other Itching and Swelling    Almonds-mouth swells and itches   Almond Oil Hives   Amoxicillin Hives, Itching, Swelling and Dermatitis    Did it involve swelling of the face/tongue/throat, SOB, or low BP? No Did it involve sudden or severe rash/hives, skin peeling, or any reaction on the inside of your mouth or nose? Yes Did you need to seek medical attention at a hospital or doctor's office? Yes When did it last happen?    within the past 5 years If all above answers are "NO", may proceed with cephalosporin use.    Imitrex [Sumatriptan]     Felt like heart attack    Topamax [Topiramate]     Memory loss     Medications:  Current Outpatient Medications:    valACYclovir (VALTREX) 1000 MG tablet, Take 1 tablet (1,000 mg total) by mouth 3 (three) times daily for 10 days., Disp: 21 tablet, Rfl: 0   AIMOVIG 140 MG/ML SOAJ, Take 140 mg by mouth every 30 (thirty) days., Disp: , Rfl:    ALPRAZolam (XANAX) 0.25 MG tablet, Take 1 tablet (0.25 mg total) by mouth 2 (two) times daily as needed for anxiety., Disp: 30 tablet, Rfl: 0   Biotin w/ Vitamins C & E (HAIR/SKIN/NAILS PO), Take by mouth daily., Disp: , Rfl:    busPIRone (BUSPAR) 5 MG tablet, Take 1 tablet (5 mg total) by mouth 3 (three) times daily., Disp: 90 tablet, Rfl: 0   CALCIUM CITRATE PO, Take by mouth., Disp: , Rfl:    cyanocobalamin (,VITAMIN B-12,) 1000 MCG/ML injection, Inject 1 mL (1,000 mcg total) into the muscle every 14 (fourteen) days., Disp: 14 mL, Rfl: 1   Multiple Vitamins-Minerals (MULTIVITAMIN WITH MINERALS) tablet, Take 1 tablet by mouth daily. Bariatric advantage one a day, Disp: , Rfl:    NEEDLE, DISP, 25 G 25G X 1-1/2" MISC, 1 Device by Does not apply route  every 14 (fourteen) days., Disp: 12 each, Rfl: 3   OVER THE COUNTER MEDICATION, Take 1 tablet by mouth at bedtime. Tranquil Sleep, Disp: , Rfl:    pantoprazole (PROTONIX) 40 MG tablet, TAKE 1 TABLET BY MOUTH DAILY, Disp: 90 tablet, Rfl: 0   Rimegepant Sulfate (NURTEC PO), Take 75 mg by mouth. Once daily prn abortive for migraines, Disp: , Rfl:    sertraline (ZOLOFT) 100 MG tablet, TAKE 1 TABLET BY MOUTH EVERY DAY, Disp: 90 tablet, Rfl: 3   zonisamide (ZONEGRAN) 50 MG capsule, Take by mouth., Disp: , Rfl:   Observations/Objective: Patient is well-developed, well-nourished in no acute distress.  Resting comfortably on couch at home.  Head is normocephalic, atraumatic.  No labored breathing. Speech is clear and coherent with logical content.  Patient is alert and oriented  at baseline.  Papulovesicular rash of left abdomen with more scattered lesions around her side and hip. Rash does not cross the midline.  Assessment and Plan: 1. Herpes zoster without complication - valACYclovir (VALTREX) 1000 MG tablet; Take 1 tablet (1,000 mg total) by mouth 3 (three) times daily for 10 days.  Dispense: 21 tablet; Refill: 0 Milder symptoms. Start Valtrex 1 g TID x 7 days. Supportive measures and OTC medications reviewed. She is to keep covered and avoid pregnant women, elderly until rash has scabbed over.   Follow Up Instructions: I discussed the assessment and treatment plan with the patient. The patient was provided an opportunity to ask questions and all were answered. The patient agreed with the plan and demonstrated an understanding of the instructions.  A copy of instructions were sent to the patient via MyChart unless otherwise noted below.   The patient was advised to call back or seek an in-person evaluation if the symptoms worsen or if the condition fails to improve as anticipated.  Time:  I spent 12 minutes with the patient via telehealth technology discussing the above problems/concerns.     Leeanne Rio, PA-C

## 2021-10-19 NOTE — Patient Instructions (Signed)
Debbie Townsend, thank you for joining Leeanne Rio, PA-C for today's virtual visit.  While this provider is not your primary care provider (PCP), if your PCP is located in our provider database this encounter information will be shared with them immediately following your visit.  Consent: (Patient) Debbie Townsend provided verbal consent for this virtual visit at the beginning of the encounter.  Current Medications:  Current Outpatient Medications:    AIMOVIG 140 MG/ML SOAJ, Take 140 mg by mouth every 30 (thirty) days., Disp: , Rfl:    ALPRAZolam (XANAX) 0.25 MG tablet, Take 1 tablet (0.25 mg total) by mouth 2 (two) times daily as needed for anxiety., Disp: 30 tablet, Rfl: 0   Biotin w/ Vitamins C & E (HAIR/SKIN/NAILS PO), Take by mouth daily., Disp: , Rfl:    busPIRone (BUSPAR) 5 MG tablet, Take 1 tablet (5 mg total) by mouth 3 (three) times daily., Disp: 90 tablet, Rfl: 0   CALCIUM CITRATE PO, Take by mouth., Disp: , Rfl:    cefdinir (OMNICEF) 300 MG capsule, Take 1 capsule (300 mg total) by mouth 2 (two) times daily. (Patient not taking: Reported on 09/05/2021), Disp: 14 capsule, Rfl: 0   cyanocobalamin (,VITAMIN B-12,) 1000 MCG/ML injection, Inject 1 mL (1,000 mcg total) into the muscle every 14 (fourteen) days., Disp: 14 mL, Rfl: 1   cyclobenzaprine (FLEXERIL) 5 MG tablet, Take 1-2 tablets (5-10 mg total) by mouth at bedtime as needed for muscle spasms. (Patient not taking: Reported on 09/05/2021), Disp: 60 tablet, Rfl: 11   doxycycline (VIBRA-TABS) 100 MG tablet, Take 1 tablet (100 mg total) by mouth 2 (two) times daily. (Patient not taking: Reported on 09/05/2021), Disp: 20 tablet, Rfl: 0   Multiple Vitamins-Minerals (MULTIVITAMIN WITH MINERALS) tablet, Take 1 tablet by mouth daily. Bariatric advantage one a day, Disp: , Rfl:    NEEDLE, DISP, 25 G 25G X 1-1/2" MISC, 1 Device by Does not apply route every 14 (fourteen) days., Disp: 12 each, Rfl: 3   OVER THE COUNTER MEDICATION, Take  1 tablet by mouth at bedtime. Tranquil Sleep, Disp: , Rfl:    pantoprazole (PROTONIX) 40 MG tablet, TAKE 1 TABLET BY MOUTH DAILY, Disp: 90 tablet, Rfl: 0   predniSONE (DELTASONE) 20 MG tablet, Take 2 tablets (40 mg total) by mouth daily with breakfast. (Patient not taking: Reported on 09/05/2021), Disp: 14 tablet, Rfl: 0   Rimegepant Sulfate (NURTEC PO), Take 75 mg by mouth. Once daily prn abortive for migraines, Disp: , Rfl:    sertraline (ZOLOFT) 100 MG tablet, TAKE 1 TABLET BY MOUTH EVERY DAY, Disp: 90 tablet, Rfl: 3   Medications ordered in this encounter:  No orders of the defined types were placed in this encounter.    *If you need refills on other medications prior to your next appointment, please contact your pharmacy*  Follow-Up: Call back or seek an in-person evaluation if the symptoms worsen or if the condition fails to improve as anticipated.  Other Instructions Take the Valtrex as directed. Keep hands washed. Keep the rash covered. You want to keep away from pregnant women until rash is fully scabbed over. You can use the benadryl cream OTC. Start OTC topical lidocaine cream.  Shingles Shingles is an infection. It gives you a painful skin rash and blisters that have fluid in them. Shingles is caused by the same germ (virus) that causes chickenpox. Shingles only happens in people who: Have had chickenpox. Have been given a shot (vaccine) to protect against  chickenpox. Shingles is rare in this group. What are the causes? This condition is caused by varicella-zoster virus. This is the same germ that causes chickenpox. After a person is exposed to the germ, the germ stays in the body but is not active (dormant). Shingles develops if the germ becomes active again (is reactivated). This can happen many years after the first exposure to the germ. It is not known what causes this germ to become active again. What increases the risk? People who have had chickenpox or received the  chickenpox shot are at risk for shingles. This infection is more common in people who: Are older than 36 years of age. Have a weakened disease-fighting system (immune system), such as people with: HIV (human immunodeficiency virus). AIDS (acquired immunodeficiency syndrome). Cancer. Are taking medicines that weaken the immune system, such as organ transplant medicines. Have a lot of stress. What are the signs or symptoms? The first symptoms of shingles may be itching, tingling, or pain in an area on your skin. A rash will show on your skin a few days or weeks later. This is what usually happens: The rash is likely to be on one side of your body. The rash usually has a shape like a belt or a band. Over time, the rash turns into fluid-filled blisters. The blisters will break open and change into scabs. The scabs usually dry up in about 2-3 weeks. You may also have: A fever. Chills. A headache. A feeling like you may vomit (nausea). How is this treated? The rash may last for several weeks. There is not a specific cure for this condition. Your doctor may prescribe medicines. Medicines may: Help with pain. Help you get better sooner. Help to prevent long-term problems. Help with itching (antihistamines). If the area involved is on your face, you may need to see a specialist. This may be an eye doctor or an ear, nose, and throat (ENT) doctor. Follow these instructions at home: Medicines Take over-the-counter and prescription medicines only as told by your doctor. Put on an anti-itch cream or numbing cream where you have a rash, blisters, or scabs. Do this as told by your doctor. Helping with itching and discomfort  Put cold, wet cloths (cold compresses) on the area of the rash or blisters as told by your doctor. Cool baths can help you feel better. Try adding baking soda or dry oatmeal to the water to lessen itching. Do not bathe in hot water. Use calamine lotion as told by your  doctor. Blister and rash care Keep your rash covered with a loose bandage (dressing). Wear loose clothing that does not rub on your rash. Wash your hands with soap and water for at least 20 seconds before and after you change your bandage. If you cannot use soap and water, use hand sanitizer. Change your bandage as told by your doctor. Keep your rash and blisters clean. To do this, wash the area with mild soap and cool water as told by your doctor. Check your rash every day for signs of infection. Check for: More redness, swelling, or pain. Fluid or blood. Warmth. Pus or a bad smell. Do not scratch your rash. Do not pick at your blisters. To help you to not scratch: Keep your fingernails clean and cut short. Wear gloves or mittens when you sleep, if scratching is a problem. General instructions Rest as told by your doctor. Wash your hands often with soap and water for at least 20 seconds. If you cannot  use soap and water, use hand sanitizer. Doing this lowers your chance of getting a skin infection. Your infection can cause chickenpox in people who have never had chickenpox or never got a chickenpox vaccine shot. If you have blisters that did not change into scabs yet, try not to touch other people or be around other people, especially: Babies. Pregnant women. Children who have areas of red, itchy, or rough skin (eczema). Older people who have organ transplants. People who have a long-term (chronic) illness, like cancer or AIDS. Keep all follow-up visits. How is this prevented? A vaccine shot is the best way to prevent shingles and protect against shingles problems. If you have not had a vaccine shot, talk with your doctor about getting it. Where to find more information Centers for Disease Control and Prevention: http://www.wolf.info/ Contact a doctor if: Your pain does not get better with medicine. Your pain does not get better after the rash heals. You have any of these signs of infection  around the rash: More redness, swelling, or pain. Fluid or blood. Warmth. Pus or a bad smell. You have a fever. Get help right away if: The rash is on your face or nose. You have pain in your face or pain by your eye. You lose feeling on one side of your face. You have trouble seeing. You have ear pain, or you have ringing in your ear. You have a loss of taste. Your condition gets worse. Summary Shingles gives you a painful skin rash and blisters that have fluid in them. Shingles is caused by the same germ (virus) that causes chickenpox. Keep your rash covered with a loose bandage. Wear loose clothing that does not rub on your rash. If you have blisters that did not change into scabs yet, try not to touch other people or be around people. This information is not intended to replace advice given to you by your health care provider. Make sure you discuss any questions you have with your health care provider. Document Revised: 11/27/2020 Document Reviewed: 11/27/2020 Elsevier Patient Education  2022 [employer].    If you have been instructed to have an in-person evaluation today at a local Urgent Care facility, please use the link below. It will take you to a list of all of our available [facility], including address, phone number and hours of operation. Please do not delay care.  [facility]  If you or a family member do not have a primary care provider, use the link below to schedule a visit and establish care. When you choose a [facility] primary care physician or advanced practice provider, you gain a long-term partner in health. Find a Primary Care Provider  Learn more about 's in-office and virtual care options: [facility]

## 2021-10-22 ENCOUNTER — Encounter: Payer: Self-pay | Admitting: Emergency Medicine

## 2021-10-22 ENCOUNTER — Encounter: Payer: Self-pay | Admitting: Physician Assistant

## 2021-10-22 ENCOUNTER — Ambulatory Visit
Admission: EM | Admit: 2021-10-22 | Discharge: 2021-10-22 | Disposition: A | Discharge status: Home / Self Care | Payer: BC Managed Care – PPO | Attending: Emergency Medicine | Admitting: Emergency Medicine

## 2021-10-22 ENCOUNTER — Other Ambulatory Visit: Payer: Self-pay

## 2021-10-22 DIAGNOSIS — L239 Allergic contact dermatitis, unspecified cause: Secondary | ICD-10-CM | POA: Diagnosis not present

## 2021-10-22 MED ORDER — PREDNISONE 10 MG PO TABS: ORAL_TABLET | ORAL | 0 refills | Status: AC

## 2021-10-22 NOTE — ED Triage Notes (Signed)
Pt here with rash that was treated as shingles during a televisit, but it has started to spread and is over both sides of body. The rash is very itchy. Has prescription hydrocortisone cream as well, but that is not working.

## 2021-10-22 NOTE — ED Provider Notes (Signed)
Chief Complaint   Chief Complaint  Patient presents with   Rash     Subjective, HPI  Debbie Townsend is a very pleasant 35 y.o. female who presents with rash that initially was treated for shingles during a televisit.  Patient reports that the rash started to spread over both sides of her body.  Reports that the rash is itchy.  Patient reports that she has been using prescription hydrocortisone cream which has not helped.  No changes in body wash, laundry detergents, soaps or sprays.  No fever, chills, vomiting.  No throat closing sensation or shortness of breath.  History obtained from patient.   Patient's problem list, past medical and social history, medications, and allergies were reviewed by me and updated in Epic.    ROS  See HPI.  Objective   Vitals:   10/22/21 1536  BP: (!) 148/92  Pulse: 64  Resp: 18  Temp: 98 F (36.7 C)  SpO2: 100%    Vital signs and nursing note reviewed.  General: Appears well-developed and well-nourished. No acute distress.  HEENT: Normocephalic, atraumatic, hearing grossly intact. EOMI, no drainage. No rhinorrhea. Moist mucous membranes.  Neck: Normal range of motion, neck is supple.  Cardiovascular: Normal rate.  Pulm/Chest: No respiratory distress.   Musculoskeletal: No joint deformity, normal range of motion.  Skin: Erythematous diffuse rash noted to abdomen, back and arms.  No drainage.  Data  No results found for any visits on 10/22/21.   Assessment & Plan  1. Allergic contact dermatitis, unspecified trigger - predniSONE (DELTASONE) 10 MG tablet; Take 6 tablets (60 mg total) by mouth daily for 1 day, THEN 5 tablets (50 mg total) daily for 1 day, THEN 4 tablets (40 mg total) daily for 1 day, THEN 3 tablets (30 mg total) daily for 1 day, THEN 2 tablets (20 mg total) daily for 1 day, THEN 1 tablet (10 mg total) daily for 1 day.  Dispense: 21 tablet; Refill: 0  35 y.o. female presents with rash that initially was treated for shingles  during a televisit.  Patient reports that the rash started to spread over both sides of her body.  Reports that the rash is itchy.  Patient reports that she has been using prescription hydrocortisone cream which has not helped.  No changes in body wash, laundry detergents, soaps or sprays.  No fever, chills, vomiting.  No throat closing sensation or shortness of breath.  Given symptoms along with assessment findings, likely allergic contact dermatitis.  Rx prednisone to the patient's preferred pharmacy and advised about home treatment and care with famotidine, Zyrtec/Benadryl.  PCP if rash does not improve in 5 to 6 days.  PCP or return to clinic if rash worsens or if you develop a fever.  Patient verbalized understanding and agreed with plan.  Patient stable upon discharge.  Return as needed.  Plan:   Discharge Instructions      Take prednisone as prescribed. Increase fluid intake. You may take Benadryl 25-50mg  every 4-6 hours as needed OR Zyrtec 10mg  daily for itching/inflammation. You may also take over the counter Famotidine 20mg  once daily to help with itching/inflammation. You may apply ice wrapped in a towel to affected area 3-5 times daily for 10-15 minute intervals. See your PCP if rash does not improve in 5-6 days. See your PCP or return to clinic sooner if rash worsens or you develop fever.          Serafina Royals, [place] 10/22/21 1558

## 2021-10-22 NOTE — Discharge Instructions (Addendum)
Take prednisone as prescribed. Increase fluid intake. You may take Benadryl 25-50mg  every 4-6 hours as needed OR Zyrtec 10mg  daily for itching/inflammation. You may also take over the counter Famotidine 20mg  once daily to help with itching/inflammation. You may apply ice wrapped in a towel to affected area 3-5 times daily for 10-15 minute intervals. See your PCP if rash does not improve in 5-6 days. See your PCP or return to clinic sooner if rash worsens or you develop fever.

## 2021-10-29 ENCOUNTER — Telehealth (INDEPENDENT_AMBULATORY_CARE_PROVIDER_SITE_OTHER): Payer: BC Managed Care – PPO | Admitting: Internal Medicine

## 2021-10-29 ENCOUNTER — Encounter: Payer: Self-pay | Admitting: Internal Medicine

## 2021-10-29 DIAGNOSIS — R002 Palpitations: Secondary | ICD-10-CM

## 2021-10-29 NOTE — Telephone Encounter (Signed)
Yes, that is fine. I agree to the fee    You  Jannis, [place] 4 hours ago (2:38 PM)   TM You would need to see cardiology for a heart monitor which you wear for 3-7 to 14 days  If this is a new issue we are discussing are you agreeable to my chart consult fee ? In order to get a referral to cardiology for you for this either in [place] or [place] ([facility] ) Please let me know  Thank you    Elpidio Galea T, CMA routed conversation to You 4 hours ago (2:35 PM)   Nicki Reaper, Genene Kilman  You 5 hours ago (1:57 PM)   Hey there,    I hope you are doing well. welcome back! I have been taking Zonisimide for migraines and have noticed that my heart feels like it beats irregular from time to time. What I mean by this is my heart will feel normal and then I will notice it feels like it beats hard and fast a couple of times, then back to normal. This happens if I'm active or even like right now, sitting down at my computer working from home. I reached out to the dr that put me on the Zonisimide and she wants me to stop taking it, monitor my headache frequency for a couple of weeks so that we can see what my baseline headache frequency is off of medications. Then try to come up with another plan. I am not sure how I feel about this. I have had 2 recent episodes with on the new med where I thought I was going to have to go to ER or call 911.    She wanted me to reach out to you regarding the heart irregularity and see what your thoughts are. She mentioned maybe trying to get a heart monitor for a couple of days or a week to monitor my heart as well. To see if this is something to be concerned with? thoughts?     A/P Palpit[facility] refer cards for zio monitor  Agreeable to fee  Will refer leb cards   Dr. [name] McLean-Scocuzza Time spent 5 min my chart

## 2021-10-31 NOTE — Progress Notes (Signed)
Primary Care Provider: McLean-Scocuzza, Nino Glow, MD [facility] Cardiologist: None Electrophysiologist: None Neurologist: Renato Gails. Delorse Lek, [place] ([facility],  [id number] [facility] [place]   Clinic Note: Chief Complaint  Patient presents with   New Patient (Initial Visit)    Establish care with provider for palpitations. Medications verbally reviewed with patient.    ===================================  ASSESSMENT/PLAN   Problem List Items Addressed This Visit       Cardiology Problems   Essential hypertension (Chronic)    Borderline pressures today.  She is somewhat anxious.  We can reassess her pressures when I see her in follow-up after monitor.  Not currently on any medications.   If indicated, she would have blood pressure room for AV nodal agent.      Relevant Orders   EKG 12-Lead     Other   Rapid palpitations - Primary (Chronic)    What she is describing sounds like she is probably feeling short bursts of PAT or SVT.  Probably not long left actually treat unless extensive episodes noted.  She could also be feeling bigeminy or trigeminy but she describes the heartbeat is very fast which would probably proclaim bigeminy or trigeminy.  Not likely long enough to be atrial fibrillation or flutter.  There also not likely long enough to discuss vagal maneuvers although we will address pending results of monitor.  Plan: 2-week Zio patch monitor.      Relevant Orders   EKG 12-Lead   LONG TERM MONITOR (3-14 DAYS)   Prediabetes    In conjunction with obesity and potential hypertension, she may meet criteria for metabolic syndrome.  Although she was young, not unreasonable to assess baseline cardiovascular risk.      Chest pain of uncertain etiology    She reportedly had reproducible musculoskeletal chest pain on palpation which may or may not be all what she is feeling.  Symptoms do not sound anginal in nature, however there is some exertional component associated with  dyspnea.  We discussed different ischemic evaluation options. I do not think she will walk on the treadmill with her brace.  This leaves [place] with either Lexiscan Myoview or coronary CTA.  Would like to make decision about potential hearing evaluation once we see the event monitor results as that may change our plan.      Relevant Orders   EKG 12-Lead    ===================================  HPI:    TENEIL SHILLER is a obese (status post gastric sleeve) 35 y.o. female with a PMH notable for Pseudotumor Cerebri, HTN, Prediabetes, HTN and Chronic Migraines who is being seen today for the evaluation of IRREGULAR HEARTBEAT/PALPITATIONS at the request of McLean-Scocuzza, [name] *.  Recent Hospitalizations:   10/22/2021 [facility] ED: Presented with a rash.  Initially treated as shingles via telemedicine.  No help with hydrocortisone prescription. Noted to have erythematous diffuse rash on the abdomen back and arms => treated as potential allergic contact dermatitis with prednisone taper.  CRISTI GWYNN was recently started on zonisamide for migraines after her Neurology visit 09/24/21 -- was having at least 1 severe Migraine (24-48 hr episodes) & recently up to 3 per week.  She was unable to afford her previous migraine medications.   --> She called in to her PCP that ever since starting it, she feels that she has irregular heartbeat from time to time.  She notes heart and fast beats were couple times and then back to normal.  Episodes happen both with activity and at rest. =>  Neurologist suggested either a medication holiday or evaluating for arrhythmia. ->  She contacted her PCP via MyChart, and referral was placed for cardiology.   Reviewed  CV studies:    The following studies were reviewed today: (if available, images/films reviewed: From Epic Chart or Care Everywhere) none:  Interval History:   ORPHA DAIN presents here today with multiple different complaints.  Most notably she  feels having episodes of fast heart rate versus then go back to normal lasting anywhere from 10 seconds to a minute but not usually more than a minute.  She feels a little short of breath and little discomfort in her chest when these episodes occur and they are occurring with relatively significant frequency enough that she is concerned.  Nothing really makes them worse.  About the time that she think she started to feel symptoms the episode stopped.  She has had 2 episodes of potential syncope or near syncope 1 when she went to emergency room for stroke the second episode she was told that she had been hypoglycemic.  Basically she described standing talking to her husband and falling backwards, but not following because he caught her.  She did not lose full consciousness.  She says she always has chest discomfort, it can happen at nighttime during the day whether she is doing something or not.  Last night she had a prolonged episode while lying down in the bed with her daughter.  She can have shortness of breath that feels like a tightness in her chest with exertion but is mostly when taking a deep breath.  This is been ongoing for quite some time now. . She had been try to do a good job with exercising and then had a accident where she broke her left leg.  She was not weightbearing until roughly July when she is now finally getting back to full walking.  She is gotten quite out of shape and these echo explain some of her exertional dyspnea.  CV Review of Symptoms (Summary) Cardiovascular ROS: positive for - chest pain, dyspnea on exertion, irregular heartbeat, rapid heart rate, shortness of breath, and lightheadedness and dizziness; deconditioning negative for - edema, orthopnea, paroxysmal nocturnal dyspnea, or although she has had lightheadedness and dizziness no recent episodes of syncope/near syncope or TIA attacks.  No claudication.  REVIEWED OF SYSTEMS   Review of Systems  Constitutional:   Positive for malaise/fatigue. Negative for weight loss.  HENT:  Negative for congestion and nosebleeds.   Respiratory:  Positive for shortness of breath (Exertional.). Negative for cough.   Cardiovascular:        Per HPI  Gastrointestinal:  Negative for blood in stool and melena.  Genitourinary:  Negative for hematuria.  Musculoskeletal:        Her left leg is still in a brace.  Neurological:  Positive for dizziness. Negative for seizures and weakness.  Psychiatric/Behavioral:  Negative for depression and memory loss. The patient is nervous/anxious. The patient does not have insomnia.    I have reviewed and (if needed) personally updated the patient's problem list, medications, allergies, past medical and surgical history, social and family history.   PAST MEDICAL HISTORY   Past Medical History:  Diagnosis Date   Anxiety    BRCA negative    MyRisk update neg 5/22   Depression    Family history of anesthesia complication     mother had Post -op nausea and dizziness.   Family history of breast cancer  Fibula fracture    distal end f/u [facility] spring 2022 after fall    Foot drop, left foot    GERD (gastroesophageal reflux disease)    Headache    migraines   History of kidney stones    Hypertension    better since gastric sleeve.  no meds.   Increased risk of breast cancer 04/2021   IBIS=20.3%/riskscore=35.6%   Kidney stone    PONV (postoperative nausea and vomiting)    Pseudotumor cerebri 2007 or 2009    PAST SURGICAL HISTORY   Past Surgical History:  Procedure Laterality Date   ABDOMINAL HYSTERECTOMY     CESAREAN SECTION     2009/2017   CHOLECYSTECTOMY N/A 02/04/2019   Procedure: LAPAROSCOPIC CHOLECYSTECTOMY;  Surgeon: Fredirick Maudlin, MD;  Location: [facility] ORS;  Service: General;  Laterality: N/A;   LAPAROSCOPIC GASTRIC SLEEVE RESECTION N/A 11/14/2020   Procedure: LAPAROSCOPIC GASTRIC SLEEVE RESECTION;  Surgeon: [name], MD;  Location: [facility] ORS;  Service:  General;  Laterality: N/A;   LUMBAR LAMINECTOMY/DECOMPRESSION MICRODISCECTOMY Left 09/22/2014   Procedure: Left Lumbar four-five microdiskectomy;  Surgeon: Consuella Lose, MD;  Location: [facility];  Service: Neurosurgery;  Laterality: Left;  Left Lumbar four-five microdiskectomy   ORIF ANKLE FRACTURE Left 03/23/2021   Procedure: OPEN REDUCTION INTERNAL FIXATION (ORIF) Left lateral malleolus with syndesmosis repair;  Surgeon: Leim Fabry, MD;  Location: [place];  Service: Orthopedics;  Laterality: Left;   UPPER GI ENDOSCOPY N/A 11/14/2020   Procedure: UPPER GI ENDOSCOPY;  Surgeon: [name], MD;  Location: [facility];  Service: General;  Laterality: N/A;   WISDOM TOOTH EXTRACTION      Immunization History  Administered Date(s) Administered   DTaP 12/06/1985, 02/07/1986, 04/19/1986, 11/16/1987, 01/20/1990   Hep A / Hep B 09/24/2019, 10/27/2019   Hepatitis B 10/03/1997, 10/31/1997, 04/10/1998   IPV 12/06/1985, 02/07/1986, 04/19/1986, 11/16/1987   Influenza-Unspecified 11/20/2015   MMR 09/06/1987, 04/20/2009   Meningococcal Conjugate 01/20/1990   Tdap 01/12/2016    MEDICATIONS/ALLERGIES   Current Meds  Medication Sig   ALPRAZolam (XANAX) 0.25 MG tablet Take 1 tablet (0.25 mg total) by mouth 2 (two) times daily as needed for anxiety.   Biotin w/ Vitamins C & E (HAIR/SKIN/NAILS PO) Take by mouth daily.   busPIRone (BUSPAR) 5 MG tablet Take 1 tablet (5 mg total) by mouth 3 (three) times daily.   CALCIUM CITRATE PO Take by mouth.   cyanocobalamin (,VITAMIN B-12,) 1000 MCG/ML injection Inject 1 mL (1,000 mcg total) into the muscle every 14 (fourteen) days.   Multiple Vitamins-Minerals (MULTIVITAMIN WITH MINERALS) tablet Take 1 tablet by mouth daily. Bariatric advantage one a day   NEEDLE, DISP, 25 G 25G X 1-1/2" MISC 1 Device by Does not apply route every 14 (fourteen) days.   OVER THE COUNTER MEDICATION Take 1 tablet by mouth at bedtime. Tranquil Sleep   pantoprazole  (PROTONIX) 40 MG tablet TAKE 1 TABLET BY MOUTH DAILY   Rimegepant Sulfate (NURTEC PO) Take 75 mg by mouth. Once daily prn abortive for migraines   sertraline (ZOLOFT) 100 MG tablet TAKE 1 TABLET BY MOUTH EVERY DAY    Allergies  Allergen Reactions   Apple Other (See Comments)    Lips itch and swell, inside of mouth gets hives   Carrot [Daucus Carota] Other (See Comments)    Lips itch and swell, inside of mouth gets hives   Monistat [Miconazole] Swelling    Severe vaginal itching and swelling   Other Itching and Swelling  Almonds-mouth swells and itches   Almond Oil Hives   Amoxicillin Hives, Itching, Swelling and Dermatitis    Did it involve swelling of the face/tongue/throat, SOB, or low BP? No Did it involve sudden or severe rash/hives, skin peeling, or any reaction on the inside of your mouth or nose? Yes Did you need to seek medical attention at a hospital or doctor's office? Yes When did it last happen?    within the past 5 years If all above answers are "NO", may proceed with cephalosporin use.    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    Married with kids     Owns guns   Wears seat belt   Safe in relationship    Family History  Problem Relation Age of Onset   Cancer Mother        breast dx'ed age 9/49   Hypertension Mother    Diabetes Mother    Hypothyroidism Mother    Depression Mother    Breast cancer Mother        80s   Liver disease Father    Heart  attack Father    Arthritis Father    Alcohol abuse Father    Depression Father    Drug abuse Father    Early death Father    Heart disease Father    Hypertension Father    Hepatitis C Father    Depression Sister    Hypertension Sister    Arthritis Sister    Hypertension Sister    Cancer Maternal Grandmother        stomach>liver>brain met   Cancer Maternal Uncle        lung not a smoker     OBJCTIVE -PE, EKG, labs   Wt Readings from Last 3 Encounters:  11/01/21 227 lb (103 kg)  09/05/21 226 lb 12.8 oz (102.9 kg)  08/16/21 217 lb 14.4 oz (98.8 kg)    Physical Exam: BP 138/90 (BP Location: Left Arm, Patient Position: Sitting, Cuff Size: Normal)   Pulse (!) 57   Ht '5\' 6"'  (1.676 m)   Wt 227 lb (103 kg)   LMP 02/15/2019   SpO2 98%   BMI 36.64 kg/m  Physical Exam Vitals reviewed.  Constitutional:      General: She is not in acute distress.    Appearance: Normal appearance. She is obese. She is not ill-appearing.  HENT:     Head: Normocephalic and atraumatic.  Eyes:     Extraocular Movements: Extraocular movements intact.     Comments: Wears glasses.  Neck:     Vascular: No carotid bruit, hepatojugular reflux or JVD.  Cardiovascular:     Rate and Rhythm: Normal rate and regular rhythm. Occasional Extrasystoles are present.    Chest Wall: PMI is not displaced.  Pulses: Normal pulses and intact distal pulses.     Heart sounds: S1 normal and S2 normal. Heart sounds are distant. No murmur heard.   No friction rub. No gallop.  Pulmonary:     Effort: Pulmonary effort is normal. No respiratory distress.     Breath sounds: Normal breath sounds. No wheezing, rhonchi or rales.  Chest:     Chest wall: Tenderness (Very reproducible pain up and down the left sternal border.  With simple palpation, she winces) present.  Abdominal:     General: Bowel sounds are normal. There is no distension.     Palpations: Abdomen is soft. There is no mass.     Tenderness: There is no  abdominal tenderness.     Comments: Obese, unable to assess HSM.  Musculoskeletal:        General: Signs of injury (Left leg has a brace that goes from above the knee down to the ankle.  Its behind the leg.  Mild swelling.) present. No swelling. Normal range of motion.     Cervical back: Normal range of motion and neck supple.  Skin:    General: Skin is warm and dry.  Neurological:     General: No focal deficit present.     Mental Status: She is alert and oriented to person, place, and time.     Gait: Gait normal.  Psychiatric:        Mood and Affect: Mood normal.        Behavior: Behavior normal.        Thought Content: Thought content normal.        Judgment: Judgment normal.     Adult ECG Report  Rate: 57;  Rhythm: sinus bradycardia and otherwise normal axis, normal durations ;   Narrative Interpretation: Stable  Recent Labs: Reviewed Lab Results  Component Value Date   CHOL 129 04/27/2021   HDL 47.20 04/27/2021   LDLCALC 67 04/27/2021   TRIG 78.0 04/27/2021   CHOLHDL 3 04/27/2021   Lab Results  Component Value Date   CREATININE 0.86 06/07/2021   BUN 12 06/07/2021   NA 138 06/07/2021   K 4.3 06/07/2021   CL 106 06/07/2021   CO2 25 06/07/2021   CBC Latest Ref Rng & Units 06/07/2021 04/27/2021 11/29/2020  WBC 4.0 - 10.5 K/uL 10.3 4.4 9.5  Hemoglobin 12.0 - 15.0 g/dL 13.6 13.5 13.9  Hematocrit 36.0 - 46.0 % 41.0 40.1 41.8  Platelets 150 - 400 K/uL 285 212.0 324.0    Lab Results  Component Value Date   HGBA1C 5.3 04/27/2021   Lab Results  Component Value Date   TSH 2.42 11/01/2020    ==================================================  COVID-19 Education: The signs and symptoms of COVID-19 were discussed with the patient and how to seek care for testing (follow up with PCP or arrange E-visit).    I spent a total of 26 minutes with the patient spent in direct patient consultation.  Additional time spent with chart review  / charting (studies, outside notes,  etc): 22 min Total Time: 48 min  Current medicines are reviewed at length with the patient today.  (+/- concerns) none  This visit occurred during the SARS-CoV-2 public health emergency.  Safety protocols were in place, including screening questions prior to the visit, additional usage of staff PPE, and extensive cleaning of exam room while observing appropriate contact time as indicated for disinfecting solutions.  Notice: This dictation was prepared with Dragon dictation along with smart phrase technology. Any transcriptional  errors that result from this process are unintentional and may not be corrected upon review.   Studies Ordered:  Orders Placed This Encounter  Procedures   LONG TERM MONITOR (3-14 DAYS)   EKG 12-Lead     Patient Instructions / Medication Changes & Studies & Tests Ordered   Patient Instructions  Medication Instructions:  - Your physician recommends that you continue on your current medications as directed. Please refer to the Current Medication list given to you today.  *If you need a refill on your cardiac medications before your next appointment, please call your pharmacy*   Lab Work: - none ordered  If you have labs (blood work) drawn today and your tests are completely normal, you will receive your results only by: [place] (if you have MyChart) OR A paper copy in the mail If you have any lab test that is abnormal or we need to change your treatment, we will call you to review the results.   Testing/Procedures:  1) Heart Monitor:  To be worn: 14 days Will be mailed to your home address  Your physician has recommended that you wear a Zio XT (heart) monitor.   This monitor is a medical device that records the heart's electrical activity. Doctors most often use these monitors to diagnose arrhythmias. Arrhythmias are problems with the speed or rhythm of the heartbeat. The monitor is a small device applied to your chest. You can wear one while  you do your normal daily activities. While wearing this monitor if you have any symptoms to push the button and record what you felt. Once you have worn this monitor for the period of time provider prescribed (Usually 14 days), you will return the monitor device in the postage paid box. Once it is returned they will download the data collected and provide [place] with a report which the provider will then review and we will call you with those results. Important tips:  Avoid showering during the first 24 hours of wearing the monitor. Avoid excessive sweating to help maximize wear time. Do not submerge the device, no hot tubs, and no swimming pools. Keep any lotions or oils away from the patch. After 24 hours you may shower with the patch on. Take brief showers with your back facing the shower head.  Do not remove patch once it has been placed because that will interrupt data and decrease adhesive wear time. Push the button when you have any symptoms and write down what you were feeling. Once you have completed wearing your monitor, remove and place into box which has postage paid and place in your outgoing mailbox.  If for some reason you have misplaced your box then call our office and we can provide another box and/or mail it off for you.      Follow-Up: At [facility], you and your health needs are our priority.  As part of our continuing mission to provide you with exceptional heart care, we have created designated Provider Care Teams.  These Care Teams include your primary Cardiologist (physician) and Advanced Practice Providers (APPs -  Physician Assistants and Nurse Practitioners) who all work together to provide you with the care you need, when you need it.  We recommend signing up for the patient portal called "MyChart".  Sign up information is provided on this After Visit Summary.  MyChart is used to connect with patients for Virtual Visits (Telemedicine).  Patients are able to view  lab/test results, encounter notes, upcoming appointments, etc.  Non-urgent messages can be sent to your provider as well.   To learn more about what you can do with MyChart, go to [email or web address].    Your next appointment:   5 week(s)  The format for your next appointment:   In Person  Provider:   Glenetta Hew, MD    Other Instructions  1) Please monitor when your symptoms of chest pain occur     Glenetta Hew, M.D., M.S. Interventional Cardiologist   Pager # [phone] Phone # [phone] [address] [place], [place] [address]   Thank you for choosing [facility] in [place]!!

## 2021-11-01 ENCOUNTER — Other Ambulatory Visit: Payer: Self-pay

## 2021-11-01 ENCOUNTER — Ambulatory Visit (INDEPENDENT_AMBULATORY_CARE_PROVIDER_SITE_OTHER): Payer: BC Managed Care – PPO

## 2021-11-01 ENCOUNTER — Ambulatory Visit (INDEPENDENT_AMBULATORY_CARE_PROVIDER_SITE_OTHER): Payer: BC Managed Care – PPO | Admitting: Cardiology

## 2021-11-01 ENCOUNTER — Encounter: Payer: Self-pay | Admitting: Cardiology

## 2021-11-01 VITALS — BP 138/90 | HR 57 | Ht 66.0 in | Wt 227.0 lb

## 2021-11-01 DIAGNOSIS — I10 Essential (primary) hypertension: Secondary | ICD-10-CM

## 2021-11-01 DIAGNOSIS — R002 Palpitations: Secondary | ICD-10-CM

## 2021-11-01 DIAGNOSIS — R079 Chest pain, unspecified: Secondary | ICD-10-CM | POA: Insufficient documentation

## 2021-11-01 DIAGNOSIS — R7303 Prediabetes: Secondary | ICD-10-CM

## 2021-11-01 HISTORY — DX: Palpitations: R00.2

## 2021-11-01 HISTORY — DX: Chest pain, unspecified: R07.9

## 2021-11-01 NOTE — Assessment & Plan Note (Signed)
What she is describing sounds like she is probably feeling short bursts of PAT or SVT.  Probably not long left actually treat unless extensive episodes noted.  She could also be feeling bigeminy or trigeminy but she describes the heartbeat is very fast which would probably proclaim bigeminy or trigeminy.  Not likely long enough to be atrial fibrillation or flutter.  There also not likely long enough to discuss vagal maneuvers although we will address pending results of monitor.  Plan: 2-week Zio patch monitor.

## 2021-11-01 NOTE — Assessment & Plan Note (Signed)
Borderline pressures today.  She is somewhat anxious.  We can reassess her pressures when I see her in follow-up after monitor.  Not currently on any medications.   If indicated, she would have blood pressure room for AV nodal agent.

## 2021-11-01 NOTE — Assessment & Plan Note (Signed)
In conjunction with obesity and potential hypertension, she may meet criteria for metabolic syndrome.  Although she was young, not unreasonable to assess baseline cardiovascular risk.

## 2021-11-01 NOTE — Patient Instructions (Addendum)
Medication Instructions:  - Your physician recommends that you continue on your current medications as directed. Please refer to the Current Medication list given to you today.  *If you need a refill on your cardiac medications before your next appointment, please call your pharmacy*   Lab Work: - none ordered  If you have labs (blood work) drawn today and your tests are completely normal, you will receive your results only by: [place] (if you have MyChart) OR A paper copy in the mail If you have any lab test that is abnormal or we need to change your treatment, we will call you to review the results.   Testing/Procedures:  1) Heart Monitor:  To be worn: 14 days Will be mailed to your home address  Your physician has recommended that you wear a Zio XT (heart) monitor.   This monitor is a medical device that records the heart's electrical activity. Doctors most often use these monitors to diagnose arrhythmias. Arrhythmias are problems with the speed or rhythm of the heartbeat. The monitor is a small device applied to your chest. You can wear one while you do your normal daily activities. While wearing this monitor if you have any symptoms to push the button and record what you felt. Once you have worn this monitor for the period of time provider prescribed (Usually 14 days), you will return the monitor device in the postage paid box. Once it is returned they will download the data collected and provide [place] with a report which the provider will then review and we will call you with those results. Important tips:  Avoid showering during the first 24 hours of wearing the monitor. Avoid excessive sweating to help maximize wear time. Do not submerge the device, no hot tubs, and no swimming pools. Keep any lotions or oils away from the patch. After 24 hours you may shower with the patch on. Take brief showers with your back facing the shower head.  Do not remove patch once it has been  placed because that will interrupt data and decrease adhesive wear time. Push the button when you have any symptoms and write down what you were feeling. Once you have completed wearing your monitor, remove and place into box which has postage paid and place in your outgoing mailbox.  If for some reason you have misplaced your box then call our office and we can provide another box and/or mail it off for you.      Follow-Up: At [facility], you and your health needs are our priority.  As part of our continuing mission to provide you with exceptional heart care, we have created designated Provider Care Teams.  These Care Teams include your primary Cardiologist (physician) and Advanced Practice Providers (APPs -  Physician Assistants and Nurse Practitioners) who all work together to provide you with the care you need, when you need it.  We recommend signing up for the patient portal called "MyChart".  Sign up information is provided on this After Visit Summary.  MyChart is used to connect with patients for Virtual Visits (Telemedicine).  Patients are able to view lab/test results, encounter notes, upcoming appointments, etc.  Non-urgent messages can be sent to your provider as well.   To learn more about what you can do with MyChart, go to [email or web address].    Your next appointment:   5 week(s)  The format for your next appointment:   In Person  Provider:   Glenetta Hew, MD  Other Instructions  1) Please monitor when your symptoms of chest pain occur

## 2021-11-01 NOTE — Assessment & Plan Note (Signed)
She reportedly had reproducible musculoskeletal chest pain on palpation which may or may not be all what she is feeling.  Symptoms do not sound anginal in nature, however there is some exertional component associated with dyspnea.  We discussed different ischemic evaluation options. I do not think she will walk on the treadmill with her brace.  This leaves [place] with either Lexiscan Myoview or coronary CTA.  Would like to make decision about potential hearing evaluation once we see the event monitor results as that may change our plan.

## 2021-11-05 ENCOUNTER — Encounter: Payer: Self-pay | Admitting: Adult Health

## 2021-11-05 ENCOUNTER — Ambulatory Visit (INDEPENDENT_AMBULATORY_CARE_PROVIDER_SITE_OTHER): Payer: BC Managed Care – PPO | Admitting: Adult Health

## 2021-11-05 ENCOUNTER — Ambulatory Visit (INDEPENDENT_AMBULATORY_CARE_PROVIDER_SITE_OTHER): Payer: BC Managed Care – PPO

## 2021-11-05 ENCOUNTER — Encounter: Payer: Self-pay | Admitting: Internal Medicine

## 2021-11-05 ENCOUNTER — Other Ambulatory Visit: Payer: Self-pay

## 2021-11-05 VITALS — BP 140/88 | HR 81 | Temp 96.9°F | Ht 65.98 in | Wt 229.0 lb

## 2021-11-05 DIAGNOSIS — T300 Burn of unspecified body region, unspecified degree: Secondary | ICD-10-CM | POA: Insufficient documentation

## 2021-11-05 DIAGNOSIS — T3695XA Adverse effect of unspecified systemic antibiotic, initial encounter: Secondary | ICD-10-CM | POA: Insufficient documentation

## 2021-11-05 DIAGNOSIS — B379 Candidiasis, unspecified: Secondary | ICD-10-CM

## 2021-11-05 DIAGNOSIS — L089 Local infection of the skin and subcutaneous tissue, unspecified: Secondary | ICD-10-CM | POA: Insufficient documentation

## 2021-11-05 DIAGNOSIS — S6992XA Unspecified injury of left wrist, hand and finger(s), initial encounter: Secondary | ICD-10-CM | POA: Diagnosis not present

## 2021-11-05 HISTORY — DX: Adverse effect of unspecified systemic antibiotic, initial encounter: T36.95XA

## 2021-11-05 HISTORY — DX: Local infection of the skin and subcutaneous tissue, unspecified: L08.9

## 2021-11-05 HISTORY — DX: Candidiasis, unspecified: B37.9

## 2021-11-05 HISTORY — DX: Burn of unspecified body region, unspecified degree: T30.0

## 2021-11-05 MED ORDER — SILVER SULFADIAZINE 1 % EX CREA: 1.0000 "application " | TOPICAL_CREAM | Freq: Every day | CUTANEOUS | 0 refills | Status: DC

## 2021-11-05 MED ORDER — FLUCONAZOLE 150 MG PO TABS: 150.0000 mg | ORAL_TABLET | ORAL | 0 refills | Status: DC

## 2021-11-05 MED ORDER — CLINDAMYCIN HCL 300 MG PO CAPS: 300.0000 mg | ORAL_CAPSULE | Freq: Three times a day (TID) | ORAL | 0 refills | Status: DC

## 2021-11-05 NOTE — Progress Notes (Signed)
Acute Office Visit  Subjective:    Patient ID: Debbie Townsend, female    DOB: 08-10-1986, 35 y.o.   MRN: [id number]  Chief Complaint  Patient presents with   Burn    Pt has burn on her pinky finger. Pt states it happened 1 or 2 weeks ago. Pt states it is still painful and wants to make sure its healing properly. On left hand    Burn The incident occurred more than 1 week ago (1- 2 weeks ago). The burns occurred at home. The burns occurred while cooking. The burns were a result of contact with a hot surface (hit pinky finger on left hand on electrical eye.). The burns are located on the left fingers (pinky finger. left hand. she is right hand dominant). The pain is at a severity of 6/10. The pain is moderate.   Swelling has improved. Pain with moving pinky pain is with bending finger. She has been cleaning area with soap and water and using a antibiotic ointment over the counter without much improvement.  Yellow drainage from wound. Pain in small finger.  Patient  denies any fever, body aches,chills, rash, chest pain, shortness of breath, nausea, vomiting, or diarrhea.  .  Past Medical History:  Diagnosis Date   Anxiety    BRCA negative    MyRisk update neg 5/22   Depression    Family history of anesthesia complication     mother had Post -op nausea and dizziness.   Family history of breast cancer    Fibula fracture    distal end f/u [facility] spring 2022 after fall    Foot drop, left foot    GERD (gastroesophageal reflux disease)    Headache    migraines   History of kidney stones    Hypertension    better since gastric sleeve.  no meds.   Increased risk of breast cancer 04/2021   IBIS=20.3%/riskscore=35.6%   Kidney stone    PONV (postoperative nausea and vomiting)    Pseudotumor cerebri 2007 or 2009    Past Surgical History:  Procedure Laterality Date   ABDOMINAL HYSTERECTOMY     CESAREAN SECTION     2009/2017   CHOLECYSTECTOMY N/A 02/04/2019   Procedure:  LAPAROSCOPIC CHOLECYSTECTOMY;  Surgeon: Fredirick Maudlin, MD;  Location: [facility] OR[facility];  Service: General;  Laterality: N/A;   LAPAROSCOPIC GASTRIC SLEEVE RESECTION N/A 11/14/2020   Procedure: LAPAROSCOPIC GASTRIC SLEEVE RESECTION;  Surgeon: [name], MD;  Location: [facility];  Service: General;  Laterality: N/A;   LUMBAR LAMINECTOMY/DECOMPRESSION MICRODISCECTOMY Left 09/22/2014   Procedure: Left Lumbar four-five microdiskectomy;  Surgeon: Consuella Lose, MD;  Location: [facility];  Service: Neurosurgery;  Laterality: Left;  Left Lumbar four-five microdiskectomy   ORIF ANKLE FRACTURE Left 03/23/2021   Procedure: OPEN REDUCTION INTERNAL FIXATION (ORIF) Left lateral malleolus with syndesmosis repair;  Surgeon: Leim Fabry, MD;  Location: [place];  Service: Orthopedics;  Laterality: Left;   UPPER GI ENDOSCOPY N/A 11/14/2020   Procedure: UPPER GI ENDOSCOPY;  Surgeon: [name], MD;  Location: [facility];  Service: General;  Laterality: N/A;   WISDOM TOOTH EXTRACTION      Family History  Problem Relation Age of Onset   Cancer Mother        breast dx'ed age 13/49   Hypertension Mother    Diabetes Mother    Hypothyroidism Mother    Depression Mother    Breast cancer Mother        42s  Liver disease Father    Heart attack Father    Arthritis Father    Alcohol abuse Father    Depression Father    Drug abuse Father    Early death Father    Heart disease Father    Hypertension Father    Hepatitis C Father    Depression Sister    Hypertension Sister    Arthritis Sister    Hypertension Sister    Cancer Maternal Grandmother        stomach>liver>brain met   Cancer Maternal Uncle        lung not a smoker     Social History   Socioeconomic History   Marital status: Married    Spouse name: Not on file   Number of children: 2   Years of education: Not on file   Highest education level: Not on file  Occupational History   Not on file  Tobacco Use   Smoking  status: Former    Packs/day: 0.50    Types: Cigarettes    Quit date: 03/2019    Years since quitting: 2.6   Smokeless tobacco: Never  Vaping Use   Vaping Use: Never used  Substance and Sexual Activity   Alcohol use: Not Currently   Drug use: No   Sexual activity: Yes    Birth control/protection: Rhythm  Other Topics Concern   Not on file  Social History Narrative   High school    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 30 tablet 0   Biotin w/ Vitamins C & E (HAIR/SKIN/NAILS PO) Take by mouth daily.     busPIRone (BUSPAR) 5 MG tablet Take 1 tablet (5 mg total) by mouth 3 (three) times daily. 90 tablet 0   CALCIUM CITRATE PO Take by mouth.     cyanocobalamin (,VITAMIN B-12,) 1000 MCG/ML injection Inject 1 mL (1,000 mcg total) into the muscle every 14 (fourteen) days. 14 mL 1   Multiple Vitamins-Minerals (MULTIVITAMIN WITH MINERALS) tablet Take 1 tablet by mouth daily. Bariatric advantage one a day     NEEDLE, DISP, 25 G 25G X 1-1/2" MISC 1 Device by Does not apply route every 14 (fourteen) days. 12 each 3   OVER THE COUNTER MEDICATION Take 1 tablet by mouth at bedtime. Tranquil Sleep     pantoprazole (PROTONIX) 40 MG tablet TAKE 1 TABLET BY MOUTH DAILY 90 tablet 0   Rimegepant Sulfate (NURTEC PO) Take 75 mg by mouth. Once daily prn abortive for migraines     sertraline (ZOLOFT) 100 MG tablet TAKE 1 TABLET BY MOUTH EVERY DAY 90 tablet 3    zonisamide (ZONEGRAN) 50 MG capsule Take by mouth.     AIMOVIG 140 MG/ML SOAJ Take 140 mg by mouth every 30 (thirty) days. (Patient not taking: Reported on 11/01/2021)     No facility-administered medications  prior to visit.    Allergies  Allergen Reactions   Apple Other (See Comments)    Lips itch and swell, inside of mouth gets hives   Carrot [Daucus Carota] Other (See Comments)    Lips itch and swell, inside of mouth gets hives   Monistat [Miconazole] Swelling    Severe vaginal itching and swelling   Other Itching and Swelling    Almonds-mouth swells and itches   Almond Oil Hives   Amoxicillin Hives, Itching, Swelling and Dermatitis    Did it involve swelling of the face/tongue/throat, SOB, or low BP? No Did it involve sudden or severe rash/hives, skin peeling, or any reaction on the inside of your mouth or nose? Yes Did you need to seek medical attention at a hospital or doctor's office? Yes When did it last happen?    within the past 5 years If all above answers are "NO", may proceed with cephalosporin use.    Imitrex [Sumatriptan]     Felt like heart attack    Topamax [Topiramate]     Memory loss      Review of Systems  Constitutional: Negative.   HENT: Negative.    Respiratory: Negative.    Cardiovascular: Negative.   Gastrointestinal: Negative.   Genitourinary: Negative.   Musculoskeletal:  Positive for arthralgias (left pinky finger since burn ,swelling has improved from initial onset per patient.). Negative for back pain, gait problem, joint swelling, myalgias, neck pain and neck stiffness.  Skin:  Positive for wound. Negative for color change, pallor and rash.  Neurological: Negative.   Psychiatric/Behavioral: Negative.        Objective:    Physical Exam Vitals reviewed.  Constitutional:      General: She is not in acute distress.    Appearance: She is obese. She is not ill-appearing, toxic-appearing or diaphoretic.  HENT:     Head: Normocephalic and  atraumatic.     Right Ear: External ear normal.     Left Ear: External ear normal.     Nose: Nose normal.     Mouth/Throat:     Pharynx: Oropharynx is clear.  Eyes:     Conjunctiva/sclera: Conjunctivae normal.  Cardiovascular:     Rate and Rhythm: Normal rate and regular rhythm.     Pulses: Normal pulses.     Heart sounds: Normal heart sounds.  Pulmonary:     Effort: Pulmonary effort is normal.  Abdominal:     Palpations: Abdomen is soft.  Musculoskeletal:        General: Swelling (mild) and tenderness (left pinky finger dorsal side, see picture sent in mychart.) present.     Comments: Pain with movement of small finger left hand, scant yellow drainage present.   Skin:    General: Skin is warm.     Findings: Erythema present. No bruising.  Neurological:     Mental Status: She is oriented to person, place, and time.  Psychiatric:        Mood and Affect: Mood normal.        Behavior: Behavior normal.        Thought Content: Thought content normal.        Judgment: Judgment normal.    BP 140/88   Pulse 81   Temp (!) 96.9 F (36.1 C)   Ht 5' 5.98" (1.676 m)   Wt 229 lb (103.9 kg)   LMP 02/15/2019   SpO2 98%   BMI 36.98 kg/m  Wt Readings from Last 3 Encounters:  11/05/21  229 lb (103.9 kg)  11/01/21 227 lb (103 kg)  09/05/21 226 lb 12.8 oz (102.9 kg)    Health Maintenance Due  Topic Date Due   COVID-19 Vaccine (1) Never done   Pneumococcal Vaccine 46-46 Years old (1 - PCV) Never done   INFLUENZA VACCINE  07/16/2021    There are no preventive care reminders to display for this patient.   Lab Results  Component Value Date   TSH 2.42 11/01/2020   Lab Results  Component Value Date   WBC 10.3 06/07/2021   HGB 13.6 06/07/2021   HCT 41.0 06/07/2021   MCV 84.0 06/07/2021   PLT 285 06/07/2021   Lab Results  Component Value Date   NA 138 06/07/2021   K 4.3 06/07/2021   CO2 25 06/07/2021   GLUCOSE 89 06/07/2021   BUN 12 06/07/2021   CREATININE 0.86  06/07/2021   BILITOT 0.5 04/27/2021   ALKPHOS 51 04/27/2021   AST 20 04/27/2021   ALT 26 04/27/2021   PROT 6.8 04/27/2021   ALBUMIN 4.0 04/27/2021   CALCIUM 8.8 (L) 06/07/2021   ANIONGAP 7 06/07/2021   GFR 93.94 04/27/2021   Lab Results  Component Value Date   CHOL 129 04/27/2021   Lab Results  Component Value Date   HDL 47.20 04/27/2021   Lab Results  Component Value Date   LDLCALC 67 04/27/2021   Lab Results  Component Value Date   TRIG 78.0 04/27/2021   Lab Results  Component Value Date   CHOLHDL 3 04/27/2021   Lab Results  Component Value Date   HGBA1C 5.3 04/27/2021       Assessment & Plan:   Problem List Items Addressed This Visit       Musculoskeletal and Integument   Skin infection   Relevant Medications   clindamycin (CLEOCIN) 300 MG capsule   fluconazole (DIFLUCAN) 150 MG tablet     Other   Burn- left small finger  - Primary   Relevant Orders   DG Hand Complete Left   Antibiotic-induced yeast infection   Relevant Medications   clindamycin (CLEOCIN) 300 MG capsule   fluconazole (DIFLUCAN) 150 MG tablet     Meds ordered this encounter  Medications   clindamycin (CLEOCIN) 300 MG capsule    Sig: Take 1 capsule (300 mg total) by mouth 3 (three) times daily.    Dispense:  30 capsule    Refill:  0   silver sulfADIAZINE (SILVADENE) 1 % cream    Sig: Apply 1 application topically daily.    Dispense:  50 g    Refill:  0   fluconazole (DIFLUCAN) 150 MG tablet    Sig: Take 1 tablet (150 mg total) by mouth as directed. Take one tablet by mouth on day 1. May repeat dose of one tablet by mouth on day four.    Dispense:  2 tablet    Refill:  0  She requests Diflucan.  Discussed Cleocin and possible side effects take with food   Return in about 1 week (around 11/12/2021), or if symptoms worsen or fail to improve, for at any time for any worsening symptoms, Go to Emergency room/ urgent care if worse.   Red Flags discussed. The patient was given  clear instructions to go to ER or return to medical center if any red flags develop, symptoms do not improve, worsen or new problems develop. They verbalized understanding.    Marcille Buffy, FNP

## 2021-11-05 NOTE — Patient Instructions (Signed)
Clindamycin Capsules What is this medication? CLINDAMYCIN ([place] sin) treats infections caused by bacteria. It belongs to a group of medications called antibiotics. It will not treat colds, the flu, or infections caused by viruses. This medicine may be used for other purposes; ask your health care provider or pharmacist if you have questions. COMMON BRAND NAME(S): Cleocin What should I tell my care team before I take this medication? They need to know if you have any of these conditions: Kidney disease Liver disease Stomach problems like colitis An unusual or allergic reaction to clindamycin, lincomycin, or other medications, foods, dyes like tartrazine or preservatives Pregnant or trying to get pregnant Breast-feeding How should I use this medication? Take this medication by mouth with a full glass of water. Follow the directions on the prescription label. You can take this medication with food or on an empty stomach. If the medication upsets your stomach, take it with food. Take your medication at regular intervals. Do not take your medication more often than directed. Take all of your medication as directed even if you think you are better. Do not skip doses or stop your medication early. Talk to your care team about the use of this medication in children. Special care may be needed. Overdosage: If you think you have taken too much of this medicine contact a poison control center or emergency room at once. NOTE: This medicine is only for you. Do not share this medicine with others. What if I miss a dose? If you miss a dose, take it as soon as you can. If it is almost time for your next dose, take only that dose. Do not take double or extra doses. What may interact with this medication? Birth control pills Medications that relax muscles for surgery Rifampin This list may not describe all possible interactions. Give your health care provider a list of all the medicines, herbs,  non-prescription drugs, or dietary supplements you use. Also tell them if you smoke, drink alcohol, or use illegal drugs. Some items may interact with your medicine. What should I watch for while using this medication? Tell your care team if your symptoms do not start to get better or if they get worse. This medication may cause serious skin reactions. They can happen weeks to months after starting the medication. Contact your care team right away if you notice fevers or flu-like symptoms with a rash. The rash may be red or purple and then turn into blisters or peeling of the skin. Or, you might notice a red rash with swelling of the face, lips or lymph nodes in your neck or under your arms. Do not treat diarrhea with over the counter products. Contact your care team if you have diarrhea that lasts more than 2 days or if it is severe and watery. What side effects may I notice from receiving this medication? Side effects that you should report to your care team as soon as possible: Allergic reactions--skin rash, itching, hives, swelling of the face, lips, tongue, or throat Kidney injury--decrease in the amount of urine, swelling of the ankles, hands, or feet Rash, fever, and swollen lymph nodes Redness, blistering, peeling, or loosening of the skin, including inside the mouth Severe diarrhea, fever Unusual vaginal discharge, itching, or odor Side effects that usually do not require medical attention (report to your care team if they continue or are bothersome): Diarrhea Metallic taste in mouth Nausea Stomach pain Vomiting This list may not describe all possible side effects.  Call your doctor for medical advice about side effects. You may report side effects to FDA at 1-800-FDA-1088. Where should I keep my medication? Keep out of the reach of children. Store at room temperature between 20 and 25 degrees C (68 and 77 degrees F). Throw away any unused medication after the expiration date. NOTE:  This sheet is a summary. It may not cover all possible information. If you have questions about this medicine, talk to your doctor, pharmacist, or health care provider.  2022 Elsevier/Gold Standard (2021-02-09 00:00:00) Silver Sulfadiazine skin cream What is this medication? SILVER SULFADIAZINE (SIL ver  sul fa DYE a zeen) is a sulfonamide antibiotic. It is used on the skin for second or third degree burns. It helps to prevent or treat serious infection. This medicine may be used for other purposes; ask your health care provider or pharmacist if you have questions. COMMON BRAND NAME(S): Silvadene, SSD, SSD AF, Thermazene What should I tell my care team before I take this medication? They need to know if you have any of these conditions: anemia or other blood disorders glucose-6-phosphate dehydrogenase (G6PD) deficiency kidney disease liver disease porphyria an unusual or allergic reaction to silver sulfadiazine, sulfa drugs, other medicines, foods, dyes, or preservatives pregnant or trying to get pregnant breast-feeding How should I use this medication? This medicine is for external use only. Follow the directions on the prescription label. Clean the affected area and remove burned or dead skin. Wear a sterile glove to apply the cream. Apply the cream to cover the whole area evenly. Treated areas can be left uncovered, but a gauze dressing may be used. Do not get this medicine in your eyes. If you do, rinse out with plenty of cool tap water. Finish the full course of medicine prescribed by your doctor or health care professional even if you think your condition is better. Do not stop using except on your doctor's advice. Talk to your pediatrician regarding the use of this medicine in children. Special care may be needed. Overdosage: If you think you have taken too much of this medicine contact a poison control center or emergency room at once. NOTE: This medicine is only for you. Do not share  this medicine with others. What if I miss a dose? If you miss a dose, use it as soon as you can. If it is almost time for your next dose, use only that dose. Do not use double or extra doses. What may interact with this medication? collagenase, papain, or sutilains This list may not describe all possible interactions. Give your health care provider a list of all the medicines, herbs, non-prescription drugs, or dietary supplements you use. Also tell them if you smoke, drink alcohol, or use illegal drugs. Some items may interact with your medicine. What should I watch for while using this medication? Tell your doctor or health care professional if your skin condition does not begin to get better within 3 to 5 days. This medicine can make you more sensitive to the sun. Keep out of the sun. If you cannot avoid being in the sun, wear protective clothing and use sunscreen. Do not use sun lamps or tanning beds/booths. What side effects may I notice from receiving this medication? Side effects that you should report to your doctor or health care professional as soon as possible: fever, sore throat, chills increased sensitivity to the sun or ultraviolet light lower back pain pain or difficulty passing urine rash that appears or worsens following treatment,  continued redness, swelling, burning, itching, stinging, or pain at the area of use redness, blistering, peeling or loosening of the skin unusual bleeding or bruising Side effects that usually do not require medical attention (report to your doctor or health care professional if they continue or are bothersome): brownish gray discoloration of skin, nails or clothing itching This list may not describe all possible side effects. Call your doctor for medical advice about side effects. You may report side effects to FDA at 1-800-FDA-1088. Where should I keep my medication? Keep out of the reach of children. Store at room temperature between 15 and 30  degrees C (59 and 86 degrees F). Throw away any unused medicine after the expiration date. NOTE: This sheet is a summary. It may not cover all possible information. If you have questions about this medicine, talk to your doctor, pharmacist, or health care provider.  2022 Elsevier/Gold Standard (2008-08-20 00:00:00) Burn Care, Adult A burn is an injury to the skin or the tissues under the skin. There are three types of burns: First degree. These burns may cause the skin to be red and a bit swollen. Second degree. These burns are very painful and cause the skin to be very red. The skin may also swell, leak fluid, look shiny, and start to have blisters. Third degree. These burns cause lasting damage. They turn the skin white or black and make it look charred, dry, and leathery. Treatment for your burn will depend on the type of burn you have. Taking good care of your burn can help to prevent pain and infection. It can also help the burn heal quickly. How to care for a first-degree burn Right after a burn: Rinse or soak the burn under cool water for 5 minutes or more. Do not put ice on your burn. That can cause more damage. Put a cool, clean, wet cloth on your burn. Put lotion or gel with aloe vera on your burn. Caring for the burn Clean and care for your burn. Your doctor may tell you: To clean the burn using soap and water. To pat the burn dry using a clean cloth. Do not rub or scrub the burn. To put lotion or gel with aloe vera on your burn. How to care for a second-degree burn Right after a burn: Rinse or soak the burn under cool water. Do this for 5 to 10 minutes. Do not put ice on your burn. This can cause more damage. Remove any jewelry near the burned area. Cover the burn with a clean cloth. Caring for the burn Raise (elevate) the burned area above the level of your heart while sitting or lying down. Clean and care for your burn. Your doctor may tell you: To clean or rinse your  burn. To put a cream or ointment on the burn. To place a germ-free (sterile) dressing over the burn. A dressing is a material that is placed on a burn to help it heal. How to care for a third-degree burn Right after a burn: Cover the burn with a clean, dry cloth. Seek treatment right away if you have this kind of burn. You may: Need to stay in the hospital. Have surgery to remove burned tissue. Have surgery to put new skin on the burned area. Be given fluids through an IV tube. Caring for the burn Clean and care for your burn. Your doctor may tell you: To clean or rinse your burn. To put a cream or ointment on the burn. To  put a sterile dressing in the burn. This is called packing. To place a sterile dressing over the burn. Other things to do Raise the burned area above the level of your heart while sitting or lying down. Wear splints or immobilizers if told by your doctor. Rest as told by your doctor. Do not do sports or other activities until your doctor approves. How to prevent infection when caring for a burn  Take these steps to prevent infection: Wash your hands with soap and water for at least 20 seconds before and after caring for your burn. If you cannot use soap and water, use hand sanitizer. Wear clean or sterile gloves as told by your doctor. Do not put butter, oil, toothpaste, or other home remedies on the burn. Do not scratch or pick at the burn. Do not break any blisters. Do not peel the skin. Do not rub your burn, even when you are cleaning it. Check your burn every day for these signs of infection: More redness, swelling, or pain. Warmth. Pus or a bad smell. Red streaks around the burn. Follow these instructions at home Medicines Take over-the-counter and prescription medicines only as told by your doctor. If you were prescribed an antibiotic medicine, use it as told by your doctor. Do not stop using the antibiotic even if your condition gets better. Your  doctor may ask you to take medicine for pain before you change your dressing. General instructions  Protect your burn from the sun. Drink enough fluid to keep your pee (urine) pale yellow. Do not use any products that contain nicotine or tobacco, such as cigarettes, e-cigarettes, and chewing tobacco. These can delay healing. If you need help quitting, ask your doctor. Keep all follow-up visits as told by your doctor. This is important. Contact a doctor if: Your condition does not get better. Your condition gets worse. You have a fever or chills. Your burn feels warm to the touch. You have more redness, swelling, or pain on your burn. Your burn looks different or starts to have black or red spots on it. Your pain does not get better with medicine. Get help right away if: You have more fluid, blood, or pus coming from your burn. You have red streaks near the burn. You have very bad pain. Summary There are three types of burns. They are first degree, second degree, and third degree. Of these, a third-degree burn is most serious. This must be treated right away. Treatment for your burn will depend on the type of burn you have. Do not put butter, oil, toothpaste, or other home remedies on the burn. These things can damage your skin. Follow instructions from your doctor about how to clean and take care of your burn. This information is not intended to replace advice given to you by your health care provider. Make sure you discuss any questions you have with your health care provider. Document Revised: 01/21/2020 Document Reviewed: 09/21/2019 Elsevier Patient Education  [place].

## 2021-11-06 DIAGNOSIS — R002 Palpitations: Secondary | ICD-10-CM

## 2021-11-06 NOTE — Progress Notes (Signed)
Negative hand x ray

## 2021-11-12 ENCOUNTER — Encounter: Payer: Self-pay | Admitting: Adult Health

## 2021-11-13 ENCOUNTER — Ambulatory Visit: Payer: BC Managed Care – PPO | Admitting: Adult Health

## 2021-11-14 ENCOUNTER — Ambulatory Visit: Payer: BC Managed Care – PPO | Admitting: Adult Health

## 2021-11-14 ENCOUNTER — Other Ambulatory Visit: Payer: Self-pay | Admitting: Surgery

## 2021-11-14 DIAGNOSIS — K910 Vomiting following gastrointestinal surgery: Secondary | ICD-10-CM

## 2021-11-15 ENCOUNTER — Telehealth: Payer: Self-pay | Admitting: Cardiology

## 2021-11-15 ENCOUNTER — Encounter: Payer: Self-pay | Admitting: Internal Medicine

## 2021-11-15 NOTE — Telephone Encounter (Signed)
Patient calling States she has an upper GI procedure on Monday and would like to know if it will be ok to take monitor off early Please call to discuss

## 2021-11-15 NOTE — Telephone Encounter (Signed)
Was able to return call to Debbie Townsend, she reports GI procedure Monday at 9 am. Wants to know if ok to remove her ZIO a few days early d/t procedure.  Zio order 11/17, she reciece it the following Tuesday, and placed the monitor on Wed 11/23. If she took the monitor off Monday morning 12/5, that will be 13 days of wear, advised that will be okay to remove the monitor on Monday morning. She can then return by mail sometime after her procedure and f/u with Dr. Ellyn Hack on 12/5.  Debbie Townsend verbalized understanding and very thankful for the return call and advice. Will call back with any further concerns or questions.

## 2021-11-16 ENCOUNTER — Other Ambulatory Visit: Payer: Self-pay | Admitting: Obstetrics and Gynecology

## 2021-11-16 ENCOUNTER — Telehealth: Payer: Self-pay

## 2021-11-16 DIAGNOSIS — N644 Mastodynia: Secondary | ICD-10-CM

## 2021-11-16 NOTE — Progress Notes (Signed)
Orders for diagnostic imaging placed because of patients complaints of left breast pain she reported to [facility] imaging.

## 2021-11-16 NOTE — Telephone Encounter (Signed)
Called patient to ask where she was having new breast issues. She indicated there is a tenderness on the inside left breast, closer to the areola.

## 2021-11-16 NOTE — Addendum Note (Signed)
Addended by: Orland Mustard on: 11/16/2021 05:07 PM   Modules accepted: Orders

## 2021-11-16 NOTE — Telephone Encounter (Signed)
Pt stated that she having left breast tenderness at 3 o'clock x 1 month. She was advised she needed diagnostic mammogram & [place] of left breast.

## 2021-11-19 ENCOUNTER — Ambulatory Visit: Payer: BC Managed Care – PPO | Admitting: Cardiology

## 2021-11-19 ENCOUNTER — Other Ambulatory Visit: Payer: Self-pay

## 2021-11-19 ENCOUNTER — Ambulatory Visit
Admission: RE | Admit: 2021-11-19 | Discharge: 2021-11-19 | Disposition: A | Discharge status: Home / Self Care | Payer: BC Managed Care – PPO | Source: Ambulatory Visit | Attending: Surgery | Admitting: Surgery

## 2021-11-19 DIAGNOSIS — K2289 Other specified disease of esophagus: Secondary | ICD-10-CM | POA: Diagnosis not present

## 2021-11-19 DIAGNOSIS — K910 Vomiting following gastrointestinal surgery: Secondary | ICD-10-CM | POA: Diagnosis not present

## 2021-11-19 DIAGNOSIS — R197 Diarrhea, unspecified: Secondary | ICD-10-CM | POA: Diagnosis not present

## 2021-11-29 ENCOUNTER — Other Ambulatory Visit: Payer: Self-pay

## 2021-11-29 ENCOUNTER — Ambulatory Visit
Admission: RE | Admit: 2021-11-29 | Discharge: 2021-11-29 | Disposition: A | Discharge status: Home / Self Care | Payer: Managed Care, Other (non HMO) | Source: Ambulatory Visit | Attending: Internal Medicine | Admitting: Internal Medicine

## 2021-11-29 ENCOUNTER — Ambulatory Visit
Admission: RE | Admit: 2021-11-29 | Discharge: 2021-11-29 | Disposition: A | Discharge status: Home / Self Care | Payer: Managed Care, Other (non HMO) | Source: Ambulatory Visit | Attending: Obstetrics and Gynecology | Admitting: Obstetrics and Gynecology

## 2021-11-29 DIAGNOSIS — N644 Mastodynia: Secondary | ICD-10-CM | POA: Insufficient documentation

## 2021-11-29 DIAGNOSIS — N641 Fat necrosis of breast: Secondary | ICD-10-CM | POA: Insufficient documentation

## 2021-11-29 DIAGNOSIS — Z803 Family history of malignant neoplasm of breast: Secondary | ICD-10-CM | POA: Insufficient documentation

## 2021-11-29 HISTORY — DX: Fat necrosis of breast: N64.1

## 2021-11-29 NOTE — Addendum Note (Signed)
Addended by: Orland Mustard on: 11/29/2021 04:22 PM   Modules accepted: Orders

## 2021-12-06 ENCOUNTER — Encounter: Payer: Self-pay | Admitting: Internal Medicine

## 2021-12-06 ENCOUNTER — Telehealth: Payer: Managed Care, Other (non HMO)

## 2021-12-06 ENCOUNTER — Telehealth: Payer: Managed Care, Other (non HMO) | Admitting: Physician Assistant

## 2021-12-06 DIAGNOSIS — B999 Unspecified infectious disease: Secondary | ICD-10-CM

## 2021-12-06 DIAGNOSIS — U071 COVID-19: Secondary | ICD-10-CM | POA: Diagnosis not present

## 2021-12-06 MED ORDER — DOXYCYCLINE HYCLATE 100 MG PO TABS
100.0000 mg | ORAL_TABLET | Freq: Two times a day (BID) | ORAL | 0 refills | Status: DC
Start: 2021-12-06 — End: 2021-12-14

## 2021-12-06 MED ORDER — MOLNUPIRAVIR EUA 200MG CAPSULE: 4.0000 | ORAL_CAPSULE | Freq: Two times a day (BID) | ORAL | 0 refills | Status: AC

## 2021-12-06 MED ORDER — FLUTICASONE PROPIONATE 50 MCG/ACT NA SUSP: 2.0000 | Freq: Every day | NASAL | 0 refills | Status: DC

## 2021-12-06 NOTE — Telephone Encounter (Signed)
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

## 2021-12-06 NOTE — Progress Notes (Signed)
Virtual Visit Consent   Debbie Townsend, you are scheduled for a virtual visit with a [facility] provider today.     Just as with appointments in the office, your consent must be obtained to participate.  Your consent will be active for this visit and any virtual visit you may have with one of our providers in the next 365 days.     If you have a MyChart account, a copy of this consent can be sent to you electronically.  All virtual visits are billed to your insurance company just like a traditional visit in the office.    As this is a virtual visit, video technology does not allow for your provider to perform a traditional examination.  This may limit your provider's ability to fully assess your condition.  If your provider identifies any concerns that need to be evaluated in person or the need to arrange testing (such as labs, EKG, etc.), we will make arrangements to do so.     Although advances in technology are sophisticated, we cannot ensure that it will always work on either your end or our end.  If the connection with a video visit is poor, the visit may have to be switched to a telephone visit.  With either a video or telephone visit, we are not always able to ensure that we have a secure connection.     I need to obtain your verbal consent now.   Are you willing to proceed with your visit today?    Debbie Townsend has provided verbal consent on 12/06/2021 for a virtual visit (video or telephone).   Mar Daring, PA-C   Date: 12/06/2021 11:28 AM   Virtual Visit via Video Note   I, Mar Daring, connected with  Debbie Townsend  ([id number], [birth year]) on 12/06/21 at 11:15 AM EST by a video-enabled telemedicine application and verified that I am speaking with the correct person using two identifiers.  Location: Patient: Virtual Visit Location Patient: Home Provider: Virtual Visit Location Provider: Home Office   I discussed the limitations of evaluation and  management by telemedicine and the availability of in person appointments. The patient expressed understanding and agreed to proceed.    History of Present Illness: Debbie Townsend is a 35 y.o. who identifies as a female who was assigned female at birth, and is being seen today for URI symptoms.  HPI: URI  This is a new problem. The current episode started in the past 7 days. The problem has been gradually worsening. There has been no fever. Associated symptoms include congestion, coughing, ear pain, headaches, rhinorrhea, sinus pain, a sore throat and swollen glands.   Home test is positive for Covid 19.  Problems:  Patient Active Problem List   Diagnosis Date Noted   Fat necrosis of left breast 11/29/2021   Burn- left small finger  11/05/2021   Skin infection 11/05/2021   Antibiotic-induced yeast infection 11/05/2021   Rapid palpitations 11/01/2021   Chest pain of uncertain etiology 83/41/9622   Obesity (BMI 30-39.9) 05/07/2021   H/O gastric sleeve 12/14/2020   Iron deficiency 11/03/2020   Hair loss 07/03/2020   Prediabetes 07/03/2020   Depression, recurrent ([place]) 07/03/2020   Intractable migraine without aura and without status migrainosus 04/04/2020   Gastroesophageal reflux disease without esophagitis 03/29/2020   Chronic midline low back pain 02/13/2020   CSF leak from nose 12/07/2019   Other optic atrophy, bilateral 12/07/2019   IIH (idiopathic intracranial hypertension) 12/07/2019  Annual physical exam 09/24/2019   Insomnia 09/24/2019   Fatigue 09/24/2019   History of kidney stones 09/24/2019   Allergic rhinitis 03/24/2019   Status post laparoscopic hysterectomy 02/12/2019   Adenomyosis 02/04/2019   DUB (dysfunctional uterine bleeding) 09/10/2018   Fatty liver 04/02/2018   Gallstones 04/02/2018   Heavy menses 04/02/2018   FH: breast cancer in first degree relative 04/02/2018   Anxiety 04/02/2018   Anxiety and depression 12/16/2016   Cesarean delivery delivered  03/22/2016   Rubella non-immune status, antepartum 01/08/2016   Essential hypertension 11/13/2015   Pseudotumor cerebri 11/13/2015   HNP (herniated nucleus pulposus), lumbar 09/22/2014    Allergies:  Allergies  Allergen Reactions   Apple Other (See Comments)    Lips itch and swell, inside of mouth gets hives   Carrot [Daucus Carota] Other (See Comments)    Lips itch and swell, inside of mouth gets hives   Monistat [Miconazole] Swelling    Severe vaginal itching and swelling   Other Itching and Swelling    Almonds-mouth swells and itches   Almond Oil Hives   Amoxicillin Hives, Itching, Swelling and Dermatitis    Did it involve swelling of the face/tongue/throat, SOB, or low BP? No Did it involve sudden or severe rash/hives, skin peeling, or any reaction on the inside of your mouth or nose? Yes Did you need to seek medical attention at a hospital or doctor's office? Yes When did it last happen?    within the past 5 years If all above answers are "NO", may proceed with cephalosporin use.    Imitrex [Sumatriptan]     Felt like heart attack    Topamax [Topiramate]     Memory loss     Medications:  Current Outpatient Medications:    doxycycline (VIBRA-TABS) 100 MG tablet, Take 1 tablet (100 mg total) by mouth 2 (two) times daily., Disp: 20 tablet, Rfl: 0   fluticasone (FLONASE) 50 MCG/ACT nasal spray, Place 2 sprays into both nostrils daily., Disp: 16 g, Rfl: 0   molnupiravir EUA (LAGEVRIO) 200 mg CAPS capsule, Take 4 capsules (800 mg total) by mouth 2 (two) times daily for 5 days., Disp: 40 capsule, Rfl: 0   AIMOVIG 140 MG/ML SOAJ, Take 140 mg by mouth every 30 (thirty) days. (Patient not taking: Reported on 11/01/2021), Disp: , Rfl:    ALPRAZolam (XANAX) 0.25 MG tablet, Take 1 tablet (0.25 mg total) by mouth 2 (two) times daily as needed for anxiety., Disp: 30 tablet, Rfl: 0   Biotin w/ Vitamins C & E (HAIR/SKIN/NAILS PO), Take by mouth daily., Disp: , Rfl:    busPIRone (BUSPAR) 5  MG tablet, Take 1 tablet (5 mg total) by mouth 3 (three) times daily., Disp: 90 tablet, Rfl: 0   CALCIUM CITRATE PO, Take by mouth., Disp: , Rfl:    clindamycin (CLEOCIN) 300 MG capsule, Take 1 capsule (300 mg total) by mouth 3 (three) times daily., Disp: 30 capsule, Rfl: 0   cyanocobalamin (,VITAMIN B-12,) 1000 MCG/ML injection, Inject 1 mL (1,000 mcg total) into the muscle every 14 (fourteen) days., Disp: 14 mL, Rfl: 1   fluconazole (DIFLUCAN) 150 MG tablet, Take 1 tablet (150 mg total) by mouth as directed. Take one tablet by mouth on day 1. May repeat dose of one tablet by mouth on day four., Disp: 2 tablet, Rfl: 0   Multiple Vitamins-Minerals (MULTIVITAMIN WITH MINERALS) tablet, Take 1 tablet by mouth daily. Bariatric advantage one a day, Disp: , Rfl:    NEEDLE,  DISP, 25 G 25G X 1-1/2" MISC, 1 Device by Does not apply route every 14 (fourteen) days., Disp: 12 each, Rfl: 3   OVER THE COUNTER MEDICATION, Take 1 tablet by mouth at bedtime. Tranquil Sleep, Disp: , Rfl:    pantoprazole (PROTONIX) 40 MG tablet, TAKE 1 TABLET BY MOUTH DAILY, Disp: 90 tablet, Rfl: 0   Rimegepant Sulfate (NURTEC PO), Take 75 mg by mouth. Once daily prn abortive for migraines, Disp: , Rfl:    sertraline (ZOLOFT) 100 MG tablet, TAKE 1 TABLET BY MOUTH EVERY DAY, Disp: 90 tablet, Rfl: 3   silver sulfADIAZINE (SILVADENE) 1 % cream, Apply 1 application topically daily., Disp: 50 g, Rfl: 0   zonisamide (ZONEGRAN) 50 MG capsule, Take by mouth., Disp: , Rfl:   Observations/Objective: Patient is well-developed, well-nourished in no acute distress.  Resting comfortably at home.  Head is normocephalic, atraumatic.  No labored breathing.  Speech is clear and coherent with logical content.  Patient is alert and oriented at baseline.    Assessment and Plan: 1. COVID-19 - molnupiravir EUA (LAGEVRIO) 200 mg CAPS capsule; Take 4 capsules (800 mg total) by mouth 2 (two) times daily for 5 days.  Dispense: 40 capsule; Refill: 0 -  fluticasone (FLONASE) 50 MCG/ACT nasal spray; Place 2 sprays into both nostrils daily.  Dispense: 16 g; Refill: 0 - MyChart COVID-19 home monitoring program; Future  2. Superimposed infection - doxycycline (VIBRA-TABS) 100 MG tablet; Take 1 tablet (100 mg total) by mouth 2 (two) times daily.  Dispense: 20 tablet; Refill: 0  - Continue OTC symptomatic management of choice - Will send OTC vitamins and supplement information through AV[facility] - Molnupiravir prescribed - Doxycycline added for superimposed ear infection and possible sinus infection - Patient enrolled in MyChart symptom monitoring - Push fluids - Rest as needed - Discussed return precautions and when to seek in-person evaluation, sent via [facility]S as well  Follow Up Instructions: I discussed the assessment and treatment plan with the patient. The patient was provided an opportunity to ask questions and all were answered. The patient agreed with the plan and demonstrated an understanding of the instructions.  A copy of instructions were sent to the patient via MyChart unless otherwise noted below.    The patient was advised to call back or seek an in-person evaluation if the symptoms worsen or if the condition fails to improve as anticipated.  Time:  I spent 15 minutes with the patient via telehealth technology discussing the above problems/concerns.    Mar Daring, PA-C

## 2021-12-06 NOTE — Telephone Encounter (Signed)
Pt stated that she tested positive for covid. Pt was advised to continue forward with the virtual visit. Pt gave a verbal understanding.

## 2021-12-06 NOTE — Patient Instructions (Signed)
Debbie Townsend, thank you for joining Mar Daring, PA-C for today's virtual visit.  While this provider is not your primary care provider (PCP), if your PCP is located in our provider database this encounter information will be shared with them immediately following your visit.  Consent: (Patient) Debbie Townsend provided verbal consent for this virtual visit at the beginning of the encounter.  Current Medications:  Current Outpatient Medications:    doxycycline (VIBRA-TABS) 100 MG tablet, Take 1 tablet (100 mg total) by mouth 2 (two) times daily., Disp: 20 tablet, Rfl: 0   fluticasone (FLONASE) 50 MCG/ACT nasal spray, Place 2 sprays into both nostrils daily., Disp: 16 g, Rfl: 0   molnupiravir EUA (LAGEVRIO) 200 mg CAPS capsule, Take 4 capsules (800 mg total) by mouth 2 (two) times daily for 5 days., Disp: 40 capsule, Rfl: 0   AIMOVIG 140 MG/ML SOAJ, Take 140 mg by mouth every 30 (thirty) days. (Patient not taking: Reported on 11/01/2021), Disp: , Rfl:    ALPRAZolam (XANAX) 0.25 MG tablet, Take 1 tablet (0.25 mg total) by mouth 2 (two) times daily as needed for anxiety., Disp: 30 tablet, Rfl: 0   Biotin w/ Vitamins C & E (HAIR/SKIN/NAILS PO), Take by mouth daily., Disp: , Rfl:    busPIRone (BUSPAR) 5 MG tablet, Take 1 tablet (5 mg total) by mouth 3 (three) times daily., Disp: 90 tablet, Rfl: 0   CALCIUM CITRATE PO, Take by mouth., Disp: , Rfl:    clindamycin (CLEOCIN) 300 MG capsule, Take 1 capsule (300 mg total) by mouth 3 (three) times daily., Disp: 30 capsule, Rfl: 0   cyanocobalamin (,VITAMIN B-12,) 1000 MCG/ML injection, Inject 1 mL (1,000 mcg total) into the muscle every 14 (fourteen) days., Disp: 14 mL, Rfl: 1   fluconazole (DIFLUCAN) 150 MG tablet, Take 1 tablet (150 mg total) by mouth as directed. Take one tablet by mouth on day 1. May repeat dose of one tablet by mouth on day four., Disp: 2 tablet, Rfl: 0   Multiple Vitamins-Minerals (MULTIVITAMIN WITH MINERALS) tablet, Take  1 tablet by mouth daily. Bariatric advantage one a day, Disp: , Rfl:    NEEDLE, DISP, 25 G 25G X 1-1/2" MISC, 1 Device by Does not apply route every 14 (fourteen) days., Disp: 12 each, Rfl: 3   OVER THE COUNTER MEDICATION, Take 1 tablet by mouth at bedtime. Tranquil Sleep, Disp: , Rfl:    pantoprazole (PROTONIX) 40 MG tablet, TAKE 1 TABLET BY MOUTH DAILY, Disp: 90 tablet, Rfl: 0   Rimegepant Sulfate (NURTEC PO), Take 75 mg by mouth. Once daily prn abortive for migraines, Disp: , Rfl:    sertraline (ZOLOFT) 100 MG tablet, TAKE 1 TABLET BY MOUTH EVERY DAY, Disp: 90 tablet, Rfl: 3   silver sulfADIAZINE (SILVADENE) 1 % cream, Apply 1 application topically daily., Disp: 50 g, Rfl: 0   zonisamide (ZONEGRAN) 50 MG capsule, Take by mouth., Disp: , Rfl:    Medications ordered in this encounter:  Meds ordered this encounter  Medications   molnupiravir EUA (LAGEVRIO) 200 mg CAPS capsule    Sig: Take 4 capsules (800 mg total) by mouth 2 (two) times daily for 5 days.    Dispense:  40 capsule    Refill:  0    Order Specific Question:   Supervising Provider    Answer:   MILLER, BRIAN [[id number]]   doxycycline (VIBRA-TABS) 100 MG tablet    Sig: Take 1 tablet (100 mg total) by mouth 2 (two) times  daily.    Dispense:  20 tablet    Refill:  0    Order Specific Question:   Supervising Provider    Answer:   MILLER, BRIAN [[id number]]   fluticasone (FLONASE) 50 MCG/ACT nasal spray    Sig: Place 2 sprays into both nostrils daily.    Dispense:  16 g    Refill:  0    Order Specific Question:   Supervising Provider    Answer:   Sabra Heck, [place]     *If you need refills on other medications prior to your next appointment, please contact your pharmacy*  Follow-Up: Call back or seek an in-person evaluation if the symptoms worsen or if the condition fails to improve as anticipated.  Other Instructions Molnupiravir Oral Capsules What is this medication? MOLNUPIRAVIR (mol nue pir a vir) treats COVID-19. It is an  antiviral medication. It may decrease the risk of developing severe symptoms of COVID-19. It may also decrease the chance of going to the hospital. This medication is not approved by the FDA. The FDA has authorized emergency use of this medication during the COVID-19 pandemic. This medicine may be used for other purposes; ask your health care provider or pharmacist if you have questions. COMMON BRAND NAME(S): LAGEVRIO What should I tell my care team before I take this medication? They need to know if you have any of these conditions: Any allergies Any serious illness An unusual or allergic reaction to molnupiravir, other medications, foods, dyes, or preservatives Pregnant or trying to get pregnant Breast-feeding How should I use this medication? Take this medication by mouth with water. Take it as directed on the prescription label at the same time every day. Do not cut, crush or chew this medication. Swallow the capsules whole. You can take it with or without food. If it upsets your stomach, take it with food. Take all of this medication unless your care team tells you to stop it early. Keep taking it even if you think you are better. Talk to your care team about the use of this medication in children. Special care may be needed. Overdosage: If you think you have taken too much of this medicine contact a poison control center or emergency room at once. NOTE: This medicine is only for you. Do not share this medicine with others. What if I miss a dose? If you miss a dose, take it as soon as you can unless it is more than 10 hours late. If it is more than 10 hours late, skip the missed dose. Take the next dose at the normal time. Do not take extra or 2 doses at the same time to make up for the missed dose. What may interact with this medication? Interactions have not been studied. This list may not describe all possible interactions. Give your health care provider a list of all the medicines, herbs,  non-prescription drugs, or dietary supplements you use. Also tell them if you smoke, drink alcohol, or use illegal drugs. Some items may interact with your medicine. What should I watch for while using this medication? Your condition will be monitored carefully while you are receiving this medication. Visit your care team for regular checkups. Tell your care team if your symptoms do not start to get better or if they get worse. Do not become pregnant while taking this medication. You may need a pregnancy test before starting this medication. Women must use a reliable form of birth control while taking this medication and for  4 days after stopping the medication. Women should inform their care team if they wish to become pregnant or think they might be pregnant. Men should not father a child while taking this medication and for 3 months after stopping it. There is potential for serious harm to an unborn child. Talk to your care team for more information. Do not breast-feed an infant while taking this medication and for 4 days after stopping the medication. What side effects may I notice from receiving this medication? Side effects that you should report to your care team as soon as possible: Allergic reactions--skin rash, itching, hives, swelling of the face, lips, tongue, or throat Side effects that usually do not require medical attention (report these to your care team if they continue or are bothersome): Diarrhea Dizziness Nausea This list may not describe all possible side effects. Call your doctor for medical advice about side effects. You may report side effects to FDA at 1-800-FDA-1088. Where should I keep my medication? Keep out of the reach of children and pets. Store at room temperature between 20 and 25 degrees C (68 and 77 degrees F). Get rid of any unused medication after the expiration date. To get rid of medications that are no longer needed or have expired: Take the medication to a  medication take-back program. Check with your pharmacy or law enforcement to find a location. If you cannot return the medication, check the label or package insert to see if the medication should be thrown out in the garbage or flushed down the toilet. If you are not sure, ask your care team. If it is safe to put it in the trash, take the medication out of the container. Mix the medication with cat litter, dirt, coffee grounds, or other unwanted substance. Seal the mixture in a bag or container. Put it in the trash. NOTE: This sheet is a summary. It may not cover all possible information. If you have questions about this medicine, talk to your doctor, pharmacist, or health care provider.  2022 Elsevier/Gold Standard (2020-12-11 00:00:00)   COVID-19: Quarantine and Isolation Quarantine If you were exposed Quarantine and stay away from others when you have been in close contact with someone who has COVID-19. Isolate If you are sick or test positive Isolate when you are sick or when you have COVID-19, even if you don't have symptoms. When to stay home Calculating quarantine The date of your exposure is considered day 0. Day 1 is the first full day after your last contact with a person who has had COVID-19. Stay home and away from other people for at least 5 days. Learn why CDC updated guidance for the general public. IF YOU were exposed to COVID-19 and are NOT  up to dateIF YOU were exposed to COVID-19 and are NOT on COVID-19 vaccinations Quarantine for at least 5 days Stay home Stay home and quarantine for at least 5 full days. Wear a well-fitting mask if you must be around others in your home. Do not travel. Get tested Even if you don't develop symptoms, get tested at least 5 days after you last had close contact with someone with COVID-19. After quarantine Watch for symptoms Watch for symptoms until 10 days after you last had close contact with someone with COVID-19. Avoid travel It is  best to avoid travel until a full 10 days after you last had close contact with someone with COVID-19. If you develop symptoms Isolate immediately and get tested. Continue to stay home  until you know the results. Wear a well-fitting mask around others. Take precautions until day 10 Wear a well-fitting mask Wear a well-fitting mask for 10 full days any time you are around others inside your home or in public. Do not go to places where you are unable to wear a well-fitting mask. If you must travel during days 6-10, take precautions. Avoid being around people who are more likely to get very sick from COVID-19. IF YOU were exposed to COVID-19 and are  up to dateIF YOU were exposed to COVID-19 and are on COVID-19 vaccinations No quarantine You do not need to stay home unless you develop symptoms. Get tested Even if you don't develop symptoms, get tested at least 5 days after you last had close contact with someone with COVID-19. Watch for symptoms Watch for symptoms until 10 days after you last had close contact with someone with COVID-19. If you develop symptoms Isolate immediately and get tested. Continue to stay home until you know the results. Wear a well-fitting mask around others. Take precautions until day 10 Wear a well-fitting mask Wear a well-fitting mask for 10 full days any time you are around others inside your home or in public. Do not go to places where you are unable to wear a well-fitting mask. Take precautions if traveling Avoid being around people who are more likely to get very sick from COVID-19. IF YOU were exposed to COVID-19 and had confirmed COVID-19 within the past 90 days (you tested positive using a viral test) No quarantine You do not need to stay home unless you develop symptoms. Watch for symptoms Watch for symptoms until 10 days after you last had close contact with someone with COVID-19. If you develop symptoms Isolate immediately and get tested. Continue to  stay home until you know the results. Wear a well-fitting mask around others. Take precautions until day 10 Wear a well-fitting mask Wear a well-fitting mask for 10 full days any time you are around others inside your home or in public. Do not go to places where you are unable to wear a well-fitting mask. Take precautions if traveling Avoid being around people who are more likely to get very sick from COVID-19. Calculating isolation Day 0 is your first day of symptoms or a positive viral test. Day 1 is the first full day after your symptoms developed or your test specimen was collected. If you have COVID-19 or have symptoms, isolate for at least 5 days. IF YOU tested positive for COVID-19 or have symptoms, regardless of vaccination status Stay home for at least 5 days Stay home for 5 days and isolate from others in your home. Wear a well-fitting mask if you must be around others in your home. Do not travel. Ending isolation if you had symptoms End isolation after 5 full days if you are fever-free for 24 hours (without the use of fever-reducing medication) and your symptoms are improving. Ending isolation if you did NOT have symptoms End isolation after at least 5 full days after your positive test. If you got very sick from COVID-19 or have a weakened immune system You should isolate for at least 10 days. Consult your doctor before ending isolation. Take precautions until day 10 Wear a well-fitting mask Wear a well-fitting mask for 10 full days any time you are around others inside your home or in public. Do not go to places where you are unable to wear a well-fitting mask. Do not travel Do not travel until  a full 10 days after your symptoms started or the date your positive test was taken if you had no symptoms. Avoid being around people who are more likely to get very sick from COVID-19. Definitions Exposure Contact with someone infected with SARS-CoV-2, the virus that causes COVID-19,  in a way that increases the likelihood of getting infected with the virus. Close contact A close contact is someone who was less than 6 feet away from an infected person (laboratory-confirmed or a clinical diagnosis) for a cumulative total of 15 minutes or more over a 24-hour period. For example, three individual 5-minute exposures for a total of 15 minutes. People who are exposed to someone with COVID-19 after they completed at least 5 days of isolation are not considered close contacts. Julio Sicks is a strategy used to prevent transmission of COVID-19 by keeping people who have been in close contact with someone with COVID-19 apart from others. Who does not need to quarantine? If you had close contact with someone with COVID-19 and you are in one of the following groups, you do not need to quarantine. You are up to date with your COVID-19 vaccines. You had confirmed COVID-19 within the last 90 days (meaning you tested positive using a viral test). If you are up to date with COVID-19 vaccines, you should wear a well-fitting mask around others for 10 days from the date of your last close contact with someone with COVID-19 (the date of last close contact is considered day 0). Get tested at least 5 days after you last had close contact with someone with COVID-19. If you test positive or develop COVID-19 symptoms, isolate from other people and follow recommendations in the Isolation section below. If you tested positive for COVID-19 with a viral test within the previous 90 days and subsequently recovered and remain without COVID-19 symptoms, you do not need to quarantine or get tested after close contact. You should wear a well-fitting mask around others for 10 days from the date of your last close contact with someone with COVID-19 (the date of last close contact is considered day 0). If you have COVID-19 symptoms, get tested and isolate from other people and follow recommendations in the Isolation  section below. Who should quarantine? If you come into close contact with someone with COVID-19, you should quarantine if you are not up to date on COVID-19 vaccines. This includes people who are not vaccinated. What to do for quarantine Stay home and away from other people for at least 5 days (day 0 through day 5) after your last contact with a person who has COVID-19. The date of your exposure is considered day 0. Wear a well-fitting mask when around others at home, if possible. For 10 days after your last close contact with someone with COVID-19, watch for fever (100.19F or greater), cough, shortness of breath, or other COVID-19 symptoms. If you develop symptoms, get tested immediately and isolate until you receive your test results. If you test positive, follow isolation recommendations. If you do not develop symptoms, get tested at least 5 days after you last had close contact with someone with COVID-19. If you test negative, you can leave your home, but continue to wear a well-fitting mask when around others at home and in public until 10 days after your last close contact with someone with COVID-19. If you test positive, you should isolate for at least 5 days from the date of your positive test (if you do not have symptoms). If you  do develop COVID-19 symptoms, isolate for at least 5 days from the date your symptoms began (the date the symptoms started is day 0). Follow recommendations in the isolation section below. If you are unable to get a test 5 days after last close contact with someone with COVID-19, you can leave your home after day 5 if you have been without COVID-19 symptoms throughout the 5-day period. Wear a well-fitting mask for 10 days after your date of last close contact when around others at home and in public. Avoid people who are have weakened immune systems or are more likely to get very sick from COVID-19, and nursing homes and other high-risk settings, until after at least 10  days. If possible, stay away from people you live with, especially people who are at higher risk for getting very sick from COVID-19, as well as others outside your home throughout the full 10 days after your last close contact with someone with COVID-19. If you are unable to quarantine, you should wear a well-fitting mask for 10 days when around others at home and in public. If you are unable to wear a mask when around others, you should continue to quarantine for 10 days. Avoid people who have weakened immune systems or are more likely to get very sick from COVID-19, and nursing homes and other high-risk settings, until after at least 10 days. See additional information about travel. Do not go to places where you are unable to wear a mask, such as restaurants and some gyms, and avoid eating around others at home and at work until after 10 days after your last close contact with someone with COVID-19. After quarantine Watch for symptoms until 10 days after your last close contact with someone with COVID-19. If you have symptoms, isolate immediately and get tested. Quarantine in high-risk congregate settings In certain congregate settings that have high risk of secondary transmission (such as Systems analyst and detention facilities, homeless shelters, or cruise ships), CDC recommends a 10-day quarantine for residents, regardless of vaccination and booster status. During periods of critical staffing shortages, facilities may consider shortening the quarantine period for staff to ensure continuity of operations. Decisions to shorten quarantine in these settings should be made in consultation with state, local, tribal, or territorial health departments and should take into consideration the context and characteristics of the facility. CDC's setting-specific guidance provides additional recommendations for these settings. Isolation Isolation is used to separate people with confirmed or suspected COVID-19 from  those without COVID-19. People who are in isolation should stay home until it's safe for them to be around others. At home, anyone sick or infected should separate from others, or wear a well-fitting mask when they need to be around others. People in isolation should stay in a specific "sick room" or area and use a separate bathroom if available. Everyone who has presumed or confirmed COVID-19 should stay home and isolate from other people for at least 5 full days (day 0 is the first day of symptoms or the date of the day of the positive viral test for asymptomatic persons). They should wear a mask when around others at home and in public for an additional 5 days. People who are confirmed to have COVID-19 or are showing symptoms of COVID-19 need to isolate regardless of their vaccination status. This includes: People who have a positive viral test for COVID-19, regardless of whether or not they have symptoms. People with symptoms of COVID-19, including people who are awaiting test results or  have not been tested. People with symptoms should isolate even if they do not know if they have been in close contact with someone with COVID-19. What to do for isolation Monitor your symptoms. If you have an emergency warning sign (including trouble breathing), seek emergency medical care immediately. Stay in a separate room from other household members, if possible. Use a separate bathroom, if possible. Take steps to improve ventilation at home, if possible. Avoid contact with other members of the household and pets. Don't share personal household items, like cups, towels, and utensils. Wear a well-fitting mask when you need to be around other people. Learn more about what to do if you are sick and how to notify your contacts. Ending isolation for people who had COVID-19 and had symptoms If you had COVID-19 and had symptoms, isolate for at least 5 days. To calculate your 5-day isolation period, day 0 is your first  day of symptoms. Day 1 is the first full day after your symptoms developed. You can leave isolation after 5 full days. You can end isolation after 5 full days if you are fever-free for 24 hours without the use of fever-reducing medication and your other symptoms have improved (Loss of taste and smell may persist for weeks or months after recovery and need not delay the end of isolation). You should continue to wear a well-fitting mask around others at home and in public for 5 additional days (day 6 through day 10) after the end of your 5-day isolation period. If you are unable to wear a mask when around others, you should continue to isolate for a full 10 days. Avoid people who have weakened immune systems or are more likely to get very sick from COVID-19, and nursing homes and other high-risk settings, until after at least 10 days. If you continue to have fever or your other symptoms have not improved after 5 days of isolation, you should wait to end your isolation until you are fever-free for 24 hours without the use of fever-reducing medication and your other symptoms have improved. Continue to wear a well-fitting mask through day 10. Contact your healthcare provider if you have questions. See additional information about travel. Do not go to places where you are unable to wear a mask, such as restaurants and some gyms, and avoid eating around others at home and at work until a full 10 days after your first day of symptoms. If an individual has access to a test and wants to test, the best approach is to use an antigen test1 towards the end of the 5-day isolation period. Collect the test sample only if you are fever-free for 24 hours without the use of fever-reducing medication and your other symptoms have improved (loss of taste and smell may persist for weeks or months after recovery and need not delay the end of isolation). If your test result is positive, you should continue to isolate until day 10. If  your test result is negative, you can end isolation, but continue to wear a well-fitting mask around others at home and in public until day 10. Follow additional recommendations for masking and avoiding travel as described above. 1As noted in the labeling for authorized over-the counter antigen tests: Negative results should be treated as presumptive. Negative results do not rule out SARS-CoV-2 infection and should not be used as the sole basis for treatment or patient management decisions, including infection control decisions. To improve results, antigen tests should be used twice over a  three-day period with at least 24 hours and no more than 48 hours between tests. Note that these recommendations on ending isolation do not apply to people who are moderately ill or very sick from COVID-19 or have weakened immune systems. See section below for recommendations for when to end isolation for these groups. Ending isolation for people who tested positive for COVID-19 but had no symptoms If you test positive for COVID-19 and never develop symptoms, isolate for at least 5 days. Day 0 is the day of your positive viral test (based on the date you were tested) and day 1 is the first full day after the specimen was collected for your positive test. You can leave isolation after 5 full days. If you continue to have no symptoms, you can end isolation after at least 5 days. You should continue to wear a well-fitting mask around others at home and in public until day 10 (day 6 through day 10). If you are unable to wear a mask when around others, you should continue to isolate for 10 days. Avoid people who have weakened immune systems or are more likely to get very sick from COVID-19, and nursing homes and other high-risk settings, until after at least 10 days. If you develop symptoms after testing positive, your 5-day isolation period should start over. Day 0 is your first day of symptoms. Follow the recommendations above  for ending isolation for people who had COVID-19 and had symptoms. See additional information about travel. Do not go to places where you are unable to wear a mask, such as restaurants and some gyms, and avoid eating around others at home and at work until 10 days after the day of your positive test. If an individual has access to a test and wants to test, the best approach is to use an antigen test1 towards the end of the 5-day isolation period. If your test result is positive, you should continue to isolate until day 10. If your test result is positive, you can also choose to test daily and if your test result is negative, you can end isolation, but continue to wear a well-fitting mask around others at home and in public until day 10. Follow additional recommendations for masking and avoiding travel as described above. 1As noted in the labeling for authorized over-the counter antigen tests: Negative results should be treated as presumptive. Negative results do not rule out SARS-CoV-2 infection and should not be used as the sole basis for treatment or patient management decisions, including infection control decisions. To improve results, antigen tests should be used twice over a three-day period with at least 24 hours and no more than 48 hours between tests. Ending isolation for people who were moderately or very sick from COVID-19 or have a weakened immune system People who are moderately ill from COVID-19 (experiencing symptoms that affect the lungs like shortness of breath or difficulty breathing) should isolate for 10 days and follow all other isolation precautions. To calculate your 10-day isolation period, day 0 is your first day of symptoms. Day 1 is the first full day after your symptoms developed. If you are unsure if your symptoms are moderate, talk to a healthcare provider for further guidance. People who are very sick from COVID-19 (this means people who were hospitalized or required intensive  care or ventilation support) and people who have weakened immune systems might need to isolate at home longer. They may also require testing with a viral test to determine when they  can be around others. CDC recommends an isolation period of at least 10 and up to 20 days for people who were very sick from COVID-19 and for people with weakened immune systems. Consult with your healthcare provider about when you can resume being around other people. If you are unsure if your symptoms are severe or if you have a weakened immune system, talk to a healthcare provider for further guidance. People who have a weakened immune system should talk to their healthcare provider about the potential for reduced immune responses to COVID-19 vaccines and the need to continue to follow current prevention measures (including wearing a well-fitting mask and avoiding crowds and poorly ventilated indoor spaces) to protect themselves against COVID-19 until advised otherwise by their healthcare provider. Close contacts of immunocompromised people--including household members--should also be encouraged to receive all recommended COVID-19 vaccine doses to help protect these people. Isolation in high-risk congregate settings In certain high-risk congregate settings that have high risk of secondary transmission and where it is not feasible to cohort people (such as Systems analyst and detention facilities, homeless shelters, and cruise ships), CDC recommends a 10-day isolation period for residents. During periods of critical staffing shortages, facilities may consider shortening the isolation period for staff to ensure continuity of operations. Decisions to shorten isolation in these settings should be made in consultation with state, local, tribal, or territorial health departments and should take into consideration the context and characteristics of the facility. CDC's setting-specific guidance provides additional recommendations for these  settings. This CDC guidance is meant to supplement--not replace--any federal, state, local, territorial, or tribal health and safety laws, rules, and regulations. Recommendations for specific settings These recommendations do not apply to healthcare professionals. For guidance specific to these settings, see Healthcare professionals: Interim Guidance for Optician, dispensing with SARS-CoV-2 Infection or Exposure to SARS-CoV-2 Patients, residents, and visitors to healthcare settings: Interim Infection Prevention and Control Recommendations for Healthcare Personnel During the [place] 2019 (COVID-19) Pandemic Additional setting-specific guidance and recommendations are available. These recommendations on quarantine and isolation do apply to [facility] settings. Additional guidance is available here: Overview of COVID-19 Quarantine for K-12 Schools Travelers: Travel information and recommendations Congregate facilities and other settings: [employer] for community, work, and school settings Ongoing COVID-19 exposure FAQs I live with someone with COVID-19, but I cannot be separated from them. How do we manage quarantine in this situation? It is very important for people with COVID-19 to remain apart from other people, if possible, even if they are living together. If separation of the person with COVID-19 from others that they live with is not possible, the other people that they live with will have ongoing exposure, meaning they will be repeatedly exposed until that person is no longer able to spread the virus to other people. In this situation, there are precautions you can take to limit the spread of COVID-19: The person with COVID-19 and everyone they live with should wear a well-fitting mask inside the home. If possible, one person should care for the person with COVID-19 to limit the number of people who are in close contact with the infected person. Take steps to protect  yourself and others to reduce transmission in the home: Quarantine if you are not up to date with your COVID-19 vaccines. Isolate if you are sick or tested positive for COVID-19, even if you don't have symptoms. Learn more about the public health recommendations for testing, mask use and quarantine of close contacts, like yourself, who have  ongoing exposure. These recommendations differ depending on your vaccination status. What should I do if I have ongoing exposure to COVID-19 from someone I live with? Recommendations for this situation depend on your vaccination status: If you are not up to date on COVID-19 vaccines and have ongoing exposure to COVID-19, you should: Begin quarantine immediately and continue to quarantine throughout the isolation period of the person with COVID-19. Continue to quarantine for an additional 5 days starting the day after the end of isolation for the person with COVID-19. Get tested at least 5 days after the end of isolation of the infected person that lives with them. If you test negative, you can leave the home but should continue to wear a well-fitting mask when around others at home and in public until 10 days after the end of isolation for the person with COVID-19. Isolate immediately if you develop symptoms of COVID-19 or test positive. If you are up to date with COVID-19 vaccines and have ongoing exposure to COVID-19, you should: Get tested at least 5 days after your first exposure. A person with COVID-19 is considered infectious starting 2 days before they develop symptoms, or 2 days before the date of their positive test if they do not have symptoms. Get tested again at least 5 days after the end of isolation for the person with COVID-19. Wear a well-fitting mask when you are around the person with COVID-19, and do this throughout their isolation period. Wear a well-fitting mask around others for 10 days after the infected person's isolation period  ends. Isolate immediately if you develop symptoms of COVID-19 or test positive. What should I do if multiple people I live with test positive for COVID-19 at different times? Recommendations for this situation depend on your vaccination status: If you are not up to date with your COVID-19 vaccines, you should: Quarantine throughout the isolation period of any infected person that you live with. Continue to quarantine until 5 days after the end of isolation date for the most recently infected person that lives with you. For example, if the last day of isolation of the person most recently infected with COVID-19 was June 30, the new 5-day quarantine period starts on July 1. Get tested at least 5 days after the end of isolation for the most recently infected person that lives with you. Wear a well-fitting mask when you are around any person with COVID-19 while that person is in isolation. Wear a well-fitting mask when you are around other people until 10 days after your last close contact. Isolate immediately if you develop symptoms of COVID-19 or test positive. If you are up to date with your COVID-19 vaccines, you should: Get tested at least 5 days after your first exposure. A person with COVID-19 is considered infectious starting 2 days before they developed symptoms, or 2 days before the date of their positive test if they do not have symptoms. Get tested again at least 5 days after the end of isolation for the most recently infected person that lives with you. Wear a well-fitting mask when you are around any person with COVID-19 while that person is in isolation. Wear a well-fitting mask around others for 10 days after the end of isolation for the most recently infected person that lives with you. For example, if the last day of isolation for the person most recently infected with COVID-19 was June 30, the new 10-day period to wear a well-fitting mask indoors in public starts on July 1.  Isolate  immediately if you develop symptoms of COVID-19 or test positive. I had COVID-19 and completed isolation. Do I have to quarantine or get tested if someone I live with gets COVID-19 shortly after I completed isolation? No. If you recently completed isolation and someone that lives with you tests positive for the virus that causes COVID-19 shortly after the end of your isolation period, you do not have to quarantine or get tested as long as you do not develop new symptoms. Once all of the people that live together have completed isolation or quarantine, refer to the guidance below for new exposures to COVID-19. If you had COVID-19 in the previous 90 days and then came into close contact with someone with COVID-19, you do not have to quarantine or get tested if you do not have symptoms. But you should: Wear a well-fitting mask indoors in public for 10 days after your last close contact. Monitor for COVID-19 symptoms for 10 days from the date of your last close contact. Isolate immediately and get tested if symptoms develop. If more than 90 days have passed since your recovery from infection, follow CDC's recommendations for close contacts. These recommendations will differ depending on your vaccination status. 03/14/2021 Content source: [facility] for Immunization and Respiratory Diseases ([facility]), Division of Viral Diseases This information is not intended to replace advice given to you by your health care provider. Make sure you discuss any questions you have with your health care provider. Document Revised: 07/18/2021 Document Reviewed: 07/18/2021 Elsevier Patient Education  [facility] Can Do to Manage Your COVID-19 Symptoms at Home If you have possible or confirmed COVID-19 Stay home except to get medical care. Monitor your symptoms carefully. If your symptoms get worse, call your healthcare provider immediately. Get rest and stay hydrated. If you have a medical  appointment, call the healthcare provider ahead of time and tell them that you have or may have COVID-19. For medical emergencies, call 911 and notify the dispatch personnel that you have or may have COVID-19. Cover your cough and sneezes with a tissue or use the inside of your elbow. Wash your hands often with soap and water for at least 20 seconds or clean your hands with an alcohol-based hand sanitizer that contains at least 60% alcohol. As much as possible, stay in a specific room and away from other people in your home. Also, you should use a separate bathroom, if available. If you need to be around other people in or outside of the home, wear a mask. Avoid sharing personal items with other people in your household, like dishes, towels, and bedding. Clean all surfaces that are touched often, like counters, tabletops, and doorknobs. Use household cleaning sprays or wipes according to the label instructions. [email or web address] 06/30/2020 This information is not intended to replace advice given to you by your health care provider. Make sure you discuss any questions you have with your health care provider. Document Revised: 08/24/2021 Document Reviewed: 08/24/2021 Elsevier Patient Education  2022 [employer].    If you have been instructed to have an in-person evaluation today at a local Urgent Care facility, please use the link below. It will take you to a list of all of our available [facility], including address, phone number and hours of operation. Please do not delay care.  [facility]  If you or a family member do not have a primary care provider, use the link below to  schedule a visit and establish care. When you choose a [facility] primary care physician or advanced practice provider, you gain a long-term partner in health. Find a Primary Care Provider  Learn more about 's in-office and virtual care options: [facility]

## 2021-12-12 ENCOUNTER — Telehealth: Payer: Self-pay | Admitting: Internal Medicine

## 2021-12-12 ENCOUNTER — Ambulatory Visit
Admission: EM | Admit: 2021-12-12 | Discharge: 2021-12-12 | Disposition: A | Discharge status: Home / Self Care | Payer: Managed Care, Other (non HMO) | Attending: Medical Oncology | Admitting: Medical Oncology

## 2021-12-12 ENCOUNTER — Other Ambulatory Visit: Payer: Self-pay

## 2021-12-12 ENCOUNTER — Encounter: Payer: Self-pay | Admitting: Emergency Medicine

## 2021-12-12 DIAGNOSIS — G932 Benign intracranial hypertension: Secondary | ICD-10-CM

## 2021-12-12 DIAGNOSIS — R519 Headache, unspecified: Secondary | ICD-10-CM | POA: Diagnosis not present

## 2021-12-12 MED ORDER — ONDANSETRON 4 MG PO TBDP
4.0000 mg | ORAL_TABLET | Freq: Once | ORAL | Status: AC
  Administered 2021-12-12: 16:00:00 4 mg via ORAL

## 2021-12-12 MED ORDER — KETOROLAC TROMETHAMINE 60 MG/2ML IM SOLN
60.0000 mg | Freq: Once | INTRAMUSCULAR | Status: AC
  Administered 2021-12-12: 16:00:00 60 mg via INTRAMUSCULAR

## 2021-12-12 NOTE — ED Provider Notes (Signed)
[facility]    CSN: [id number] Arrival date & time: 12/12/21  1432      History   Chief Complaint Chief Complaint  Patient presents with   Migraine    HPI Debbie Townsend is a 35 y.o. female.   HPI  Migraine: Patient presents with a headache that she has had for the past few hours. She does have a history of IIH and migraine. Current headache is mainly on her right sided behind her eye and wrapping down to her neck. She has had some floaters in vision and sparkly lights that she has seen mostly in the right eye. She has had some photophobia which is normal for her with migraines along with 2 episodes of vomiting. Nausea and one episode of vomiting is normally all she has. She denies fevers, cold symptoms, double vision, neck stiffness, fever. She has not taken anything yet for symptoms but is hoping for a migraine cocktail.   Past Medical History:  Diagnosis Date   Anxiety    BRCA negative    MyRisk update neg 5/22   Depression    Family history of anesthesia complication     mother had Post -op nausea and dizziness.   Family history of breast cancer    Fibula fracture    distal end f/u [facility] spring 2022 after fall    Foot drop, left foot    GERD (gastroesophageal reflux disease)    Headache    migraines   History of kidney stones    Hypertension    better since gastric sleeve.  no meds.   Increased risk of breast cancer 04/2021   IBIS=20.3%/riskscore=35.6%   Kidney stone    PONV (postoperative nausea and vomiting)    Pseudotumor cerebri 2007 or 2009    Patient Active Problem List   Diagnosis Date Noted   Fat necrosis of left breast 11/29/2021   Burn- left small finger  11/05/2021   Skin infection 11/05/2021   Antibiotic-induced yeast infection 11/05/2021   Rapid palpitations 11/01/2021   Chest pain of uncertain etiology 57/32/2025   Obesity (BMI 30-39.9) 05/07/2021   H/O gastric sleeve 12/14/2020   Iron deficiency 11/03/2020   Hair loss  07/03/2020   Prediabetes 07/03/2020   Depression, recurrent ([place]) 07/03/2020   Intractable migraine without aura and without status migrainosus 04/04/2020   Gastroesophageal reflux disease without esophagitis 03/29/2020   Chronic midline low back pain 02/13/2020   CSF leak from nose 12/07/2019   Other optic atrophy, bilateral 12/07/2019   IIH (idiopathic intracranial hypertension) 12/07/2019   Annual physical exam 09/24/2019   Insomnia 09/24/2019   Fatigue 09/24/2019   History of kidney stones 09/24/2019   Allergic rhinitis 03/24/2019   Status post laparoscopic hysterectomy 02/12/2019   Adenomyosis 02/04/2019   DUB (dysfunctional uterine bleeding) 09/10/2018   Fatty liver 04/02/2018   Gallstones 04/02/2018   Heavy menses 04/02/2018   FH: breast cancer in first degree relative 04/02/2018   Anxiety 04/02/2018   Anxiety and depression 12/16/2016   Cesarean delivery delivered 03/22/2016   Rubella non-immune status, antepartum 01/08/2016   Essential hypertension 11/13/2015   Pseudotumor cerebri 11/13/2015   HNP (herniated nucleus pulposus), lumbar 09/22/2014    Past Surgical History:  Procedure Laterality Date   ABDOMINAL HYSTERECTOMY     CESAREAN SECTION     2009/2017   CHOLECYSTECTOMY N/A 02/04/2019   Procedure: LAPAROSCOPIC CHOLECYSTECTOMY;  Surgeon: Fredirick Maudlin, MD;  Location: [facility];  Service: General;  Laterality: N/A;  LAPAROSCOPIC GASTRIC SLEEVE RESECTION N/A 11/14/2020   Procedure: LAPAROSCOPIC GASTRIC SLEEVE RESECTION;  Surgeon: [name], MD;  Location: [facility];  Service: General;  Laterality: N/A;   LUMBAR LAMINECTOMY/DECOMPRESSION MICRODISCECTOMY Left 09/22/2014   Procedure: Left Lumbar four-five microdiskectomy;  Surgeon: Consuella Lose, MD;  Location: [facility] [facility];  Service: Neurosurgery;  Laterality: Left;  Left Lumbar four-five microdiskectomy   ORIF ANKLE FRACTURE Left 03/23/2021   Procedure: OPEN REDUCTION INTERNAL FIXATION (ORIF) Left lateral  malleolus with syndesmosis repair;  Surgeon: Leim Fabry, MD;  Location: [facility];  Service: Orthopedics;  Laterality: Left;   UPPER GI ENDOSCOPY N/A 11/14/2020   Procedure: UPPER GI ENDOSCOPY;  Surgeon: [name], MD;  Location: [facility];  Service: General;  Laterality: N/A;   WISDOM TOOTH EXTRACTION      OB History     Gravida  2   Para  2   Term  1   Preterm  1   AB      Living  2      SAB      IAB      Ectopic      Multiple      Live Births  2            Home Medications    Prior to Admission medications   Medication Sig Start Date End Date Taking? Authorizing Provider  AIMOVIG 140 MG/ML SOAJ Take 140 mg by mouth every 30 (thirty) days. Patient not taking: Reported on 11/01/2021 06/26/20   [provider]  ALPRAZolam Duanne Moron) 0.25 MG tablet Take 1 tablet (0.25 mg total) by mouth 2 (two) times daily as needed for anxiety. 08/01/21   Kennyth Arnold, FNP  Biotin w/ Vitamins C & E (HAIR/SKIN/NAILS PO) Take by mouth daily.    [provider]  busPIRone (BUSPAR) 5 MG tablet Take 1 tablet (5 mg total) by mouth 3 (three) times daily. 08/16/21   Mar Daring, PA-C  CALCIUM CITRATE PO Take by mouth.    [provider]  clindamycin (CLEOCIN) 300 MG capsule Take 1 capsule (300 mg total) by mouth 3 (three) times daily. 11/05/21   Flinchum, Kelby Aline, FNP  cyanocobalamin (,VITAMIN B-12,) 1000 MCG/ML injection Inject 1 mL (1,000 mcg total) into the muscle every 14 (fourteen) days. 06/20/21   McLean-Scocuzza, Nino Glow, MD  doxycycline (VIBRA-TABS) 100 MG tablet Take 1 tablet (100 mg total) by mouth 2 (two) times daily. 12/06/21   Mar Daring, PA-C  fluconazole (DIFLUCAN) 150 MG tablet Take 1 tablet (150 mg total) by mouth as directed. Take one tablet by mouth on day 1. May repeat dose of one tablet by mouth on day four. 11/05/21   Flinchum, Kelby Aline, FNP  fluticasone (FLONASE) 50 MCG/ACT nasal spray Place 2 sprays into  both nostrils daily. 12/06/21   Mar Daring, PA-C  Multiple Vitamins-Minerals (MULTIVITAMIN WITH MINERALS) tablet Take 1 tablet by mouth daily. Bariatric advantage one a day    [provider]  NEEDLE, DISP, 25 G 25G X 1-1/2" MISC 1 Device by Does not apply route every 14 (fourteen) days. 06/20/21   McLean-Scocuzza, Nino Glow, MD  OVER THE COUNTER MEDICATION Take 1 tablet by mouth at bedtime. Tranquil Sleep    [provider]  pantoprazole (PROTONIX) 40 MG tablet TAKE 1 TABLET BY MOUTH DAILY 11/15/20 11/15/21  [name], MD  Rimegepant Sulfate (NURTEC PO) Take 75 mg by mouth. Once daily prn abortive for migraines  [provider]  sertraline (ZOLOFT) 100 MG tablet TAKE 1 TABLET BY MOUTH EVERY DAY 05/01/21   McLean-Scocuzza, Nino Glow, MD  silver sulfADIAZINE (SILVADENE) 1 % cream Apply 1 application topically daily. 11/05/21   Flinchum, Kelby Aline, FNP  zonisamide (ZONEGRAN) 50 MG capsule Take by mouth. 09/24/21   [provider]  gabapentin (NEURONTIN) 100 MG capsule Take 2 capsules (200 mg total) by mouth every 12 (twelve) hours. 11/15/20 11/24/20  [name], MD    Family History Family History  Problem Relation Age of Onset   Cancer Mother        breast dx'ed age 21/49   Hypertension Mother    Diabetes Mother    Hypothyroidism Mother    Depression Mother    Breast cancer Mother        77s   Liver disease Father    Heart attack Father    Arthritis Father    Alcohol abuse Father    Depression Father    Drug abuse Father    Early death Father    Heart disease Father    Hypertension Father    Hepatitis C Father    Depression Sister    Hypertension Sister    Arthritis Sister    Hypertension Sister    Cancer Maternal Grandmother        stomach>liver>brain met   Cancer Maternal Uncle        lung not a smoker     Social History Social History   Tobacco Use   Smoking status: Former    Packs/day: 0.50    Types: Cigarettes     Quit date: 03/2019    Years since quitting: 2.7   Smokeless tobacco: Never  Vaping Use   Vaping Use: Never used  Substance Use Topics   Alcohol use: Not Currently   Drug use: No     Allergies   Apple, Carrot [daucus carota], Monistat [miconazole], Other, Almond oil, Amoxicillin, Imitrex [sumatriptan], and Topamax [topiramate]   Review of Systems Review of Systems  As stated above in HPI Physical Exam Triage Vital Signs ED Triage Vitals  Enc Vitals Group     BP 12/12/21 1452 (!) 148/90     Pulse Rate 12/12/21 1452 78     Resp 12/12/21 1452 18     Temp 12/12/21 1452 98.7 F (37.1 C)     Temp Source 12/12/21 1452 Oral     SpO2 12/12/21 1452 98 %     Weight --      Height --      Head Circumference --      Peak Flow --      Pain Score 12/12/21 1454 8     Pain Loc --      Pain Edu? --      Excl. in [place]? --    No data found.  Updated Vital Signs BP (!) 148/90    Pulse 78    Temp 98.7 F (37.1 C) (Oral)    Resp 18    LMP 02/15/2019    SpO2 98%   Physical Exam Vitals and nursing note reviewed.  Constitutional:      General: She is not in acute distress.    Appearance: Normal appearance. She is not ill-appearing, toxic-appearing or diaphoretic.  HENT:     Head: Normocephalic and atraumatic.     Right Ear: Tympanic membrane normal.     Left Ear: Tympanic membrane normal.     Nose: Nose normal. No  congestion or rhinorrhea.  Eyes:     General:        Right eye: No discharge.        Left eye: No discharge.     Extraocular Movements: Extraocular movements intact.     Conjunctiva/sclera: Conjunctivae normal.     Pupils: Pupils are equal, round, and reactive to light.  Cardiovascular:     Rate and Rhythm: Normal rate and regular rhythm.     Pulses: Normal pulses.     Heart sounds: Normal heart sounds.  Pulmonary:     Effort: Pulmonary effort is normal.     Breath sounds: Normal breath sounds.  Musculoskeletal:        General: Normal range of motion.      Cervical back: Normal range of motion and neck supple. No rigidity or tenderness.  Lymphadenopathy:     Cervical: No cervical adenopathy.  Skin:    General: Skin is warm.     Coloration: Skin is not pale.     Findings: No rash.  Neurological:     General: No focal deficit present.     Mental Status: She is alert and oriented to person, place, and time. Mental status is at baseline.     Cranial Nerves: No cranial nerve deficit.     Sensory: No sensory deficit.     Motor: No weakness.     Coordination: Coordination normal.     Gait: Gait normal.     Deep Tendon Reflexes: Reflexes normal.  Psychiatric:        Mood and Affect: Mood normal.        Behavior: Behavior normal.        Thought Content: Thought content normal.        Judgment: Judgment normal.     UC Treatments / Results  Labs (all labs ordered are listed, but only abnormal results are displayed) Labs Reviewed - No data to display  EKG   Radiology No results found.  Procedures Procedures (including critical care time)  Medications Ordered in UC Medications - No data to display  Initial Impression / Assessment and Plan / UC Course  I have reviewed the triage vital signs and the nursing notes.  Pertinent labs & imaging results that were available during my care of the patient were reviewed by me and considered in my medical decision making (see chart for details).     New. Appears to be a bit more severe than her normal migraines. Given her history of IIH we discussed trailing tramadol and zofran- holding steroid to avoid CSF increase. If symptoms not greatly improved within 1-2 hours or should symptoms worsen I would recommend ER visit. In the meantime I would recommend that she contact her specialist as they may want to schedule her for a guided LP to assess her CSF pressure. Discussed with patient along with red flag signs and symptoms. Her sister to drive her.    Final Clinical Impressions(s) / UC Diagnoses    Final diagnoses:  None   Discharge Instructions   None    ED Prescriptions   None    PDMP not reviewed this encounter.   Hughie Closs, [place] 12/12/21 1542

## 2021-12-12 NOTE — Telephone Encounter (Signed)
Pt needs to call her Neurologist for appt

## 2021-12-12 NOTE — Telephone Encounter (Signed)
Patient called in stated having migraine needing a shot... I check available appt for today, Had one appt for today  but patient could not get here in the window allowed , I advise patient of Urgent care and can speak with Access nurse . Patient advise will go to urgent care but would like to speak with the access nurse. I xfer to access nurse

## 2021-12-12 NOTE — ED Triage Notes (Signed)
Pt here with migraines that started this morning. Pt is prone to migraines. Pt is tearful in triage because she found out her MD only called in the preauth for her new medication today and the insurance has not approved it yet.

## 2021-12-12 NOTE — Telephone Encounter (Signed)
Patient informed to be seen by the emergency room. Patient stated when calling in previously that she would go be seen by urgent care. For your information

## 2021-12-13 NOTE — Telephone Encounter (Signed)
Per urgent care note Patient was advised to speak with her Neurologist.   "In the meantime I would recommend that she contact her specialist as they may want to schedule her for a guided LP to assess her CSF pressure. Discussed with patient along with red flag signs and symptoms. Her sister to drive her. "

## 2021-12-14 ENCOUNTER — Ambulatory Visit
Admission: EM | Admit: 2021-12-14 | Discharge: 2021-12-14 | Disposition: A | Discharge status: Home / Self Care | Payer: Managed Care, Other (non HMO) | Attending: Emergency Medicine | Admitting: Emergency Medicine

## 2021-12-14 ENCOUNTER — Encounter: Payer: Self-pay | Admitting: Physician Assistant

## 2021-12-14 ENCOUNTER — Other Ambulatory Visit: Payer: Self-pay

## 2021-12-14 DIAGNOSIS — J0100 Acute maxillary sinusitis, unspecified: Secondary | ICD-10-CM

## 2021-12-14 DIAGNOSIS — U071 COVID-19: Secondary | ICD-10-CM

## 2021-12-14 MED ORDER — AZITHROMYCIN 250 MG PO TABS: 250.0000 mg | ORAL_TABLET | Freq: Every day | ORAL | 0 refills | Status: DC

## 2021-12-14 NOTE — ED Provider Notes (Signed)
Roderic Palau    CSN: [id number] Arrival date & time: 12/14/21  0915      History   Chief Complaint Chief Complaint  Patient presents with   Sore Throat        Otalgia         HPI Debbie Townsend is a 35 y.o. female.  Patient presents with 1.5-week history of ear pain, sinus pressure, congestion, headache, runny nose, sore throat, cough.  Her headache is mild and not the worst of her life.  Her symptoms are not improving with current treatment.  She denies fever in the last few days.  No rash, shortness of breath, chest pain, vomiting, diarrhea, or other symptoms.  Patient had a video visit with her PCP on 12/07/2019; diagnosed with COVID (tested positive at home) and superimposed infection; treated with doxycycline, fluticasone nasal spray, and molnupiravir.  Patient had a telephone visit with her PCP on 12/12/2021; she was advised to go to the ED at that time.  She was seen here on 12/12/2021; she was diagnosed with headache and idiopathic intracranial hypertension; she was treated with tramadol and Zofran; ED precautions given and patient instructed to follow-up with her neurologist.  Her medical history includes hypertension, pseudotumor cerebri, chronic low back pain, anxiety, depression, insomnia, fatigue, GERD, migraine headaches, idiopathic intracranial hypertension, prediabetes, iron deficiency.  The history is provided by the patient and medical records.   Past Medical History:  Diagnosis Date   Anxiety    BRCA negative    MyRisk update neg 5/22   Depression    Family history of anesthesia complication     mother had Post -op nausea and dizziness.   Family history of breast cancer    Fibula fracture    distal end f/u [facility] spring 2022 after fall    Foot drop, left foot    GERD (gastroesophageal reflux disease)    Headache    migraines   History of kidney stones    Hypertension    better since gastric sleeve.  no meds.   Increased risk of breast  cancer 04/2021   IBIS=20.3%/riskscore=35.6%   Kidney stone    PONV (postoperative nausea and vomiting)    Pseudotumor cerebri 2007 or 2009    Patient Active Problem List   Diagnosis Date Noted   Fat necrosis of left breast 11/29/2021   Burn- left small finger  11/05/2021   Skin infection 11/05/2021   Antibiotic-induced yeast infection 11/05/2021   Rapid palpitations 11/01/2021   Chest pain of uncertain etiology 73/71/0626   Obesity (BMI 30-39.9) 05/07/2021   H/O gastric sleeve 12/14/2020   Iron deficiency 11/03/2020   Hair loss 07/03/2020   Prediabetes 07/03/2020   Depression, recurrent ([place]) 07/03/2020   Intractable migraine without aura and without status migrainosus 04/04/2020   Gastroesophageal reflux disease without esophagitis 03/29/2020   Chronic midline low back pain 02/13/2020   CSF leak from nose 12/07/2019   Other optic atrophy, bilateral 12/07/2019   IIH (idiopathic intracranial hypertension) 12/07/2019   Annual physical exam 09/24/2019   Insomnia 09/24/2019   Fatigue 09/24/2019   History of kidney stones 09/24/2019   Allergic rhinitis 03/24/2019   Status post laparoscopic hysterectomy 02/12/2019   Adenomyosis 02/04/2019   DUB (dysfunctional uterine bleeding) 09/10/2018   Fatty liver 04/02/2018   Gallstones 04/02/2018   Heavy menses 04/02/2018   FH: breast cancer in first degree relative 04/02/2018   Anxiety 04/02/2018   Anxiety and depression 12/16/2016   Cesarean delivery delivered  03/22/2016   Rubella non-immune status, antepartum 01/08/2016   Essential hypertension 11/13/2015   Pseudotumor cerebri 11/13/2015   HNP (herniated nucleus pulposus), lumbar 09/22/2014    Past Surgical History:  Procedure Laterality Date   ABDOMINAL HYSTERECTOMY     CESAREAN SECTION     2009/2017   CHOLECYSTECTOMY N/A 02/04/2019   Procedure: LAPAROSCOPIC CHOLECYSTECTOMY;  Surgeon: Fredirick Maudlin, MD;  Location: [facility];  Service: General;  Laterality: N/A;    LAPAROSCOPIC GASTRIC SLEEVE RESECTION N/A 11/14/2020   Procedure: LAPAROSCOPIC GASTRIC SLEEVE RESECTION;  Surgeon: [name], MD;  Location: [facility];  Service: General;  Laterality: N/A;   LUMBAR LAMINECTOMY/DECOMPRESSION MICRODISCECTOMY Left 09/22/2014   Procedure: Left Lumbar four-five microdiskectomy;  Surgeon: Consuella Lose, MD;  Location: [facility];  Service: Neurosurgery;  Laterality: Left;  Left Lumbar four-five microdiskectomy   ORIF ANKLE FRACTURE Left 03/23/2021   Procedure: OPEN REDUCTION INTERNAL FIXATION (ORIF) Left lateral malleolus with syndesmosis repair;  Surgeon: Leim Fabry, MD;  Location: [facility];  Service: Orthopedics;  Laterality: Left;   UPPER GI ENDOSCOPY N/A 11/14/2020   Procedure: UPPER GI ENDOSCOPY;  Surgeon: [name], MD;  Location: [facility];  Service: General;  Laterality: N/A;   WISDOM TOOTH EXTRACTION      OB History     Gravida  2   Para  2   Term  1   Preterm  1   AB      Living  2      SAB      IAB      Ectopic      Multiple      Live Births  2            Home Medications    Prior to Admission medications   Medication Sig Start Date End Date Taking? Authorizing Provider  azithromycin (ZITHROMAX) 250 MG tablet Take 1 tablet (250 mg total) by mouth daily. Take first 2 tablets together, then 1 every day until finished. 12/14/21  Yes Sharion Balloon, NP  AIMOVIG 140 MG/ML SOAJ Take 140 mg by mouth every 30 (thirty) days. Patient not taking: Reported on 11/01/2021 06/26/20   [provider]  ALPRAZolam Duanne Moron) 0.25 MG tablet Take 1 tablet (0.25 mg total) by mouth 2 (two) times daily as needed for anxiety. 08/01/21   Kennyth Arnold, FNP  Biotin w/ Vitamins C & E (HAIR/SKIN/NAILS PO) Take by mouth daily.    [provider]  busPIRone (BUSPAR) 5 MG tablet Take 1 tablet (5 mg total) by mouth 3 (three) times daily. 08/16/21   Mar Daring, PA-C  CALCIUM CITRATE PO Take by mouth.     [provider]  clindamycin (CLEOCIN) 300 MG capsule Take 1 capsule (300 mg total) by mouth 3 (three) times daily. 11/05/21   Flinchum, Kelby Aline, FNP  cyanocobalamin (,VITAMIN B-12,) 1000 MCG/ML injection Inject 1 mL (1,000 mcg total) into the muscle every 14 (fourteen) days. 06/20/21   McLean-Scocuzza, Nino Glow, MD  fluconazole (DIFLUCAN) 150 MG tablet Take 1 tablet (150 mg total) by mouth as directed. Take one tablet by mouth on day 1. May repeat dose of one tablet by mouth on day four. 11/05/21   Flinchum, Kelby Aline, FNP  fluticasone (FLONASE) 50 MCG/ACT nasal spray Place 2 sprays into both nostrils daily. 12/06/21   Mar Daring, PA-C  Multiple Vitamins-Minerals (MULTIVITAMIN WITH MINERALS) tablet Take 1 tablet by mouth daily. Bariatric advantage one a day  [provider]  NEEDLE, DISP, 25 G 25G X 1-1/2" MISC 1 Device by Does not apply route every 14 (fourteen) days. 06/20/21   McLean-Scocuzza, Nino Glow, MD  OVER THE COUNTER MEDICATION Take 1 tablet by mouth at bedtime. Tranquil Sleep    [provider]  pantoprazole (PROTONIX) 40 MG tablet TAKE 1 TABLET BY MOUTH DAILY 11/15/20 11/15/21  [name], MD  Rimegepant Sulfate (NURTEC PO) Take 75 mg by mouth. Once daily prn abortive for migraines    [provider]  sertraline (ZOLOFT) 100 MG tablet TAKE 1 TABLET BY MOUTH EVERY DAY 05/01/21   McLean-Scocuzza, Nino Glow, MD  silver sulfADIAZINE (SILVADENE) 1 % cream Apply 1 application topically daily. 11/05/21   Flinchum, Kelby Aline, FNP  zonisamide (ZONEGRAN) 50 MG capsule Take by mouth. 09/24/21   [provider]  gabapentin (NEURONTIN) 100 MG capsule Take 2 capsules (200 mg total) by mouth every 12 (twelve) hours. 11/15/20 11/24/20  [name], MD    Family History Family History  Problem Relation Age of Onset   Cancer Mother        breast dx'ed age 54/49   Hypertension Mother    Diabetes Mother    Hypothyroidism Mother     Depression Mother    Breast cancer Mother        82s   Liver disease Father    Heart attack Father    Arthritis Father    Alcohol abuse Father    Depression Father    Drug abuse Father    Early death Father    Heart disease Father    Hypertension Father    Hepatitis C Father    Depression Sister    Hypertension Sister    Arthritis Sister    Hypertension Sister    Cancer Maternal Grandmother        stomach>liver>brain met   Cancer Maternal Uncle        lung not a smoker     Social History Social History   Tobacco Use   Smoking status: Former    Packs/day: 0.50    Types: Cigarettes    Quit date: 03/2019    Years since quitting: 2.7   Smokeless tobacco: Never  Vaping Use   Vaping Use: Never used  Substance Use Topics   Alcohol use: Not Currently   Drug use: No     Allergies   Apple, Carrot [daucus carota], Monistat [miconazole], Other, Almond oil, Amoxicillin, Imitrex [sumatriptan], and Topamax [topiramate]   Review of Systems Review of Systems  Constitutional:  Negative for chills and fever.  HENT:  Positive for congestion, ear pain, rhinorrhea, sinus pressure and sore throat.   Respiratory:  Positive for cough. Negative for shortness of breath.   Cardiovascular:  Negative for chest pain and palpitations.  Gastrointestinal:  Negative for diarrhea and vomiting.  Skin:  Negative for color change and rash.  Neurological:  Positive for weakness and headaches. Negative for dizziness, seizures, syncope and numbness.  All other systems reviewed and are negative.   Physical Exam Triage Vital Signs ED Triage Vitals  Enc Vitals Group     BP 12/14/21 0929 127/88     Pulse Rate 12/14/21 0929 76     Resp 12/14/21 0929 19     Temp 12/14/21 0929 98.5 F (36.9 C)     Temp Source 12/14/21 0929 Oral     SpO2 12/14/21 0929 97 %     Weight --  Height --      Head Circumference --      Peak Flow --      Pain Score 12/14/21 0928 8     Pain Loc --      Pain Edu?  --      Excl. in [place]? --    No data found.  Updated Vital Signs BP 127/88 (BP Location: Left Arm)    Pulse 76    Temp 98.5 F (36.9 C) (Oral)    Resp 19    LMP 02/15/2019    SpO2 97%   Visual Acuity Right Eye Distance:   Left Eye Distance:   Bilateral Distance:    Right Eye Near:   Left Eye Near:    Bilateral Near:     Physical Exam Vitals and nursing note reviewed.  Constitutional:      General: She is not in acute distress.    Appearance: She is well-developed. She is obese. She is not ill-appearing.  HENT:     Right Ear: Tympanic membrane normal.     Left Ear: Tympanic membrane normal.     Nose: Congestion and rhinorrhea present.     Mouth/Throat:     Mouth: Mucous membranes are moist.     Pharynx: Oropharynx is clear.  Cardiovascular:     Rate and Rhythm: Normal rate and regular rhythm.     Heart sounds: Normal heart sounds.  Pulmonary:     Effort: Pulmonary effort is normal. No respiratory distress.     Breath sounds: Normal breath sounds.  Musculoskeletal:     Cervical back: Neck supple.  Skin:    General: Skin is warm and dry.  Neurological:     General: No focal deficit present.     Mental Status: She is alert and oriented to person, place, and time.     Sensory: No sensory deficit.     Motor: No weakness.     Gait: Gait normal.  Psychiatric:        Mood and Affect: Mood normal.        Behavior: Behavior normal.     UC Treatments / Results  Labs (all labs ordered are listed, but only abnormal results are displayed) Labs Reviewed - No data to display  EKG   Radiology No results found.  Procedures Procedures (including critical care time)  Medications Ordered in UC Medications - No data to display  Initial Impression / Assessment and Plan / UC Course  I have reviewed the triage vital signs and the nursing notes.  Pertinent labs & imaging results that were available during my care of the patient were reviewed by me and considered in my  medical decision making (see chart for details).  Acute sinusitis, COVID-19.  Patient tested positive for COVID at home on 12/06/2021.  She was treated by her PCP with molnupiravir; she was also treated with doxycycline at that time.  Her symptoms are not improving with current treatment.  Patient states she usually has better results when taking Zithromax.  Stopping doxycycline and treating her with Zithromax today.  Instructed patient to follow-up with PCP.  Patient agrees to plan of care.     Final Clinical Impressions(s) / UC Diagnoses   Final diagnoses:  Acute non-recurrent maxillary sinusitis  COVID-19     Discharge Instructions      Stop the doxycycline.  Start the Zithromax.  Follow up with your primary care provider.        ED Prescriptions  Medication Sig Dispense Auth. Provider   azithromycin (ZITHROMAX) 250 MG tablet Take 1 tablet (250 mg total) by mouth daily. Take first 2 tablets together, then 1 every day until finished. 6 tablet Sharion Balloon, NP      PDMP not reviewed this encounter.   Sharion Balloon, NP 12/14/21 1018

## 2021-12-14 NOTE — Discharge Instructions (Addendum)
Stop the doxycycline.  Start the Zithromax.  Follow up with your primary care provider.

## 2021-12-14 NOTE — ED Triage Notes (Signed)
Pt reports sore throat and right ear pain  x 2 days;  cough x 1 week. Pt reports she tested positive for COVID before Christmas.

## 2021-12-19 NOTE — Progress Notes (Signed)
Primary Care Provider: McLean-Scocuzza, Nino Glow, MD Cardiologist: None Electrophysiologist: None  Clinic Note: Chief Complaint  Patient presents with   Follow-up    Test results   Palpitations    Still having off-and-on palpitations, not overly worrisome   Chest Pain    Off-and-on with or without activity.   ===================================  ASSESSMENT/PLAN   Problem List Items Addressed This Visit       Cardiology Problems   Essential hypertension (Chronic)    Blood pressure is still up a little borderline today.  She is not on any medications on.  We talked about the lifestyle modification with dietary changes and increasing exercise.  Hopefully this will help reduce her blood pressure.  We can reevaluate in follow-up.        Other   Rapid palpitations - Primary (Chronic)    Thankfully, her Zio patch monitor did not show any significant amount of ectopy and there were now arrhythmias.  Relatively reassuring.  I suspect that she was feeling PACs.  Otherwise may not be cardiac related fluttering sensations.        Chest pain of uncertain etiology    Reproducible chest pain on palpation seems to be costochondritis.  Does not sound anginal in nature. She is little concerned about underlying cardiovascular risk.  We will start with risk stratification using a Coronary Calcium Score.  Based on these results may or may not proceed with [facility] or Coronary CTA.      Relevant Orders   CT CARDIAC SCORING (SELF PAY ONLY)   Obesity (BMI 30-39.9) (Chronic)    We talked about the importance of doctor medications and weight loss.  This will help her blood pressure levels but also help maintain her prediabetes and lipids.      We will check a coronary calcium score.  If it is relatively normal, then she will follow-up on an as-needed basis, otherwise we will see her back to discuss results.  ===================================  HPI:    Debbie Townsend is a 36  y.o. female with a PMH notable for Pseudotumor Cerebri, HTN, Prediabetes, chronic migraines who presents today for 37-monthfollow-up evaluation of IRREGULAR HEARTBEAT AND CHEST PAIN.    Debbie PUOPOLOwas initially seen on November 01, 2021 at the request of McLean-Scocuzza, TOlivia Mackie*.  This consultation was response to complaints of irregular heartbeat from time to time with irregular fast heartbeats for short periods of time-10 seconds to 1 minute..  There is consideration that was potentially related to her recent only adjusted migraine headache medication.  She had 2 potential near-syncopal episode.  She also noted occasional sporadic chest pain.  Describes a tightness in her chest either with or without exertion. 14-day Zio patch ordered Plan will be to reassess consideration of ischemic evaluation on follow-up.  Recent Hospitalizations:  12/12/2021: Intractable headache ER visit 12/14/2021: Maxillary sinusitis, sore throat.   Was COVID-positive on 12/22; switched from doxycycline to Zithromax.  Reviewed  CV studies:    The following studies were reviewed today: (if available, images/films reviewed: From Epic Chart or Care Everywhere) Zio patch Patch worn for 10 days, 22 hours. 100% sinus rhythm: No arrhythmias. Heart rate range 45 to 155 bpm with an average of 71 bpm. Rare isolated PACs and PVCs. Patient diary noted symptoms during combination of either sinus rhythm, sinus rhythm with artifact plus or minus PACs or PVCs. Usually without ectopy.:   Interval History:   Debbie ARRIOLAreturns here today to discuss  results of her studies.  She says she still has occasional palpitations here and there but nothing overly worrisome.  No prolonged episodes.  She also still has some off-and-on chest discomfort where she can somewhat point to where it locates.  This can happen with or without exertion. Mostly because of deconditioning from her recent illness and no Obesity, she has no  exertional dyspnea and fatigue.  Otherwise for the most part she is relatively stable, recovering from December's illnesses.  CV Review of Symptoms (Summary) Cardiovascular ROS: positive for - chest pain, dyspnea on exertion, irregular heartbeat, palpitations, shortness of breath, and follow shortness of breath is still recovering from her recent infections etc.  Chest pain worse with coughing.  May or may not be associated with activity; still has some dizziness and generalized fatigue.. negative for - orthopnea, paroxysmal nocturnal dyspnea, rapid heart rate, or lightheadedness, syncope/near syncope or TIA/amaurosis fugax.  Still not walking enough to dose claudication  REVIEWED OF SYSTEMS   Review of Systems  Constitutional:  Positive for malaise/fatigue (Still worn out from her recent infections.). Negative for weight loss.  HENT:  Positive for congestion (Clearing up). Negative for nosebleeds.   Respiratory:  Positive for cough and shortness of breath. Negative for wheezing (No longer wheezing.).   Cardiovascular:        Per HPI.  Gastrointestinal:  Negative for blood in stool and melena.  Genitourinary:  Negative for hematuria.  Musculoskeletal:  Positive for joint pain (Left leg is still sore and stiff from her surgery lap last spring.).  Neurological:  Positive for dizziness and headaches.  Psychiatric/Behavioral:  Negative for depression and memory loss. The patient is nervous/anxious. The patient does not have insomnia.    I have reviewed and (if needed) personally updated the patient's problem list, medications, allergies, past medical and surgical history, social and family history.   PAST MEDICAL HISTORY   Past Medical History:  Diagnosis Date   Anxiety    BRCA negative    MyRisk update neg 5/22   Depression    Family history of anesthesia complication     mother had Post -op nausea and dizziness.   Family history of breast cancer    Fibula fracture    distal end f/u  [facility] spring 2022 after fall    Foot drop, left foot    GERD (gastroesophageal reflux disease)    Headache    migraines   History of kidney stones    Hypertension    better since gastric sleeve.  no meds.   Increased risk of breast cancer 04/2021   IBIS=20.3%/riskscore=35.6%   Kidney stone    PONV (postoperative nausea and vomiting)    Pseudotumor cerebri 2007 or 2009    PAST SURGICAL HISTORY   Past Surgical History:  Procedure Laterality Date   ABDOMINAL HYSTERECTOMY     CESAREAN SECTION     2009/2017   CHOLECYSTECTOMY N/A 02/04/2019   Procedure: LAPAROSCOPIC CHOLECYSTECTOMY;  Surgeon: Fredirick Maudlin, MD;  Location: [facility] ORS;  Service: General;  Laterality: N/A;   LAPAROSCOPIC GASTRIC SLEEVE RESECTION N/A 11/14/2020   Procedure: LAPAROSCOPIC GASTRIC SLEEVE RESECTION;  Surgeon: [name], MD;  Location: [facility];  Service: General;  Laterality: N/A;   LUMBAR LAMINECTOMY/DECOMPRESSION MICRODISCECTOMY Left 09/22/2014   Procedure: Left Lumbar four-five microdiskectomy;  Surgeon: Consuella Lose, MD;  Location: [facility];  Service: Neurosurgery;  Laterality: Left;  Left Lumbar four-five microdiskectomy   ORIF ANKLE FRACTURE Left 03/23/2021   Procedure: OPEN REDUCTION  INTERNAL FIXATION (ORIF) Left lateral malleolus with syndesmosis repair;  Surgeon: Leim Fabry, MD;  Location: [facility];  Service: Orthopedics;  Laterality: Left;   UPPER GI ENDOSCOPY N/A 11/14/2020   Procedure: UPPER GI ENDOSCOPY;  Surgeon: [name], MD;  Location: [facility];  Service: General;  Laterality: N/A;   WISDOM TOOTH EXTRACTION      Immunization History  Administered Date(s) Administered   DTaP 12/06/1985, 02/07/1986, 04/19/1986, 11/16/1987, 01/20/1990   Hep A / Hep B 09/24/2019, 10/27/2019   Hepatitis B 10/03/1997, 10/31/1997, 04/10/1998   IPV 12/06/1985, 02/07/1986, 04/19/1986, 11/16/1987   Influenza-Unspecified 11/20/2015   MMR 09/06/1987, 04/20/2009   Meningococcal  Conjugate 01/20/1990   Tdap 01/12/2016    MEDICATIONS/ALLERGIES   Current Meds  Medication Sig   ALPRAZolam (XANAX) 0.25 MG tablet Take 1 tablet (0.25 mg total) by mouth 2 (two) times daily as needed for anxiety.   Biotin w/ Vitamins C & E (HAIR/SKIN/NAILS PO) Take by mouth daily.   busPIRone (BUSPAR) 5 MG tablet Take 1 tablet (5 mg total) by mouth 3 (three) times daily.   CALCIUM CITRATE PO Take 1 tablet by mouth daily.   cyanocobalamin (,VITAMIN B-12,) 1000 MCG/ML injection Inject 1 mL (1,000 mcg total) into the muscle every 14 (fourteen) days.   Multiple Vitamins-Minerals (MULTIVITAMIN WITH MINERALS) tablet Take 1 tablet by mouth daily. Bariatric advantage one a day   NEEDLE, DISP, 25 G 25G X 1-1/2" MISC 1 Device by Does not apply route every 14 (fourteen) days.   OVER THE COUNTER MEDICATION Take 1 tablet by mouth at bedtime. Tranquil Sleep   pantoprazole (PROTONIX) 40 MG tablet TAKE 1 TABLET BY MOUTH DAILY   sertraline (ZOLOFT) 100 MG tablet TAKE 1 TABLET BY MOUTH EVERY DAY    Allergies  Allergen Reactions   Apple Other (See Comments)    Lips itch and swell, inside of mouth gets hives   Carrot [Daucus Carota] Other (See Comments)    Lips itch and swell, inside of mouth gets hives   Monistat [Miconazole] Swelling    Severe vaginal itching and swelling   Other Itching and Swelling    Almonds-mouth swells and itches   Almond Oil Hives   Amoxicillin Hives, Itching, Swelling, Dermatitis and Rash    Did it involve swelling of the face/tongue/throat, SOB, or low BP? No Did it involve sudden or severe rash/hives, skin peeling, or any reaction on the inside of your mouth or nose? Yes Did you need to seek medical attention at a hospital or doctor's office? Yes When did it last happen?    within the past 5 years If all above answers are "NO", may proceed with cephalosporin use.  Other reaction(s): Other (See Comments) Did it involve swelling of the face/tongue/throat, SOB, or low BP?  No Did it involve sudden or severe rash/hives, skin peeling, or any reaction on the inside of your mouth or nose? Yes Did you need to seek medical attention at a hospital or doctor's office? Yes When did it last happen?    within the past 5 years If all above answers are "NO", may proceed with cephalosporin use.  Did it involve swelling of the face/tongue/throat, SOB, or low BP? No Did it involve sudden or severe rash/hives, skin peeling, or any reaction on the inside of your mouth or nose? Yes Did you need to seek medical attention at a hospital or doctor's office? Yes When did it last happen?    within the past 5 years If  all above answers are "NO", may proceed with cephalosporin use. Did it involve swelling of the face/tongue/throat, SOB, or low BP? No Did it involve sudden or severe rash/hives, skin peeling, or any reaction on the inside of your mouth or nose? Yes Did you need to seek medical attention at a hospital or doctor's office? Yes When did it last happen?    within the past 5 years If all above answers are "NO", may proceed with cephalosporin use.   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  Constitutional:      General: She is not in acute distress.    Appearance: Normal appearance. She is obese. She is not toxic-appearing.  HENT:     Head: Normocephalic and atraumatic.  Neck:     Vascular: No carotid bruit or JVD.  Cardiovascular:     Rate and Rhythm: Normal rate and regular rhythm. No extrasystoles are present.    Chest Wall: PMI is not displaced (Unable to palpate).     Pulses: Intact distal pulses.     Heart sounds: S1 normal and S2 normal. Heart sounds are distant. No murmur heard.   No friction rub. No gallop.  Pulmonary:     Effort: Pulmonary effort is normal. No respiratory distress.     Breath sounds: Normal breath sounds. No wheezing, rhonchi or rales.  Chest:     Chest wall: Tenderness (Exquisite point tenderness along with seems like the fourth costosternal joint.) present.  Musculoskeletal:        General: Signs of injury (Left ankle brace -> present since ORIF surgery.) present.     Cervical back: Normal range of motion and neck supple.  Skin:    General: Skin is warm and dry.  Neurological:     General: No focal deficit present.     Mental Status: She is alert and oriented to person, place, and time.     Cranial Nerves: No cranial nerve deficit.     Gait: Gait abnormal.    Adult ECG Report Not checked . Recent Labs: Reviewed Lab Results  Component Value Date   CHOL 129 04/27/2021  HDL 47.20 04/27/2021   LDLCALC 67 04/27/2021   TRIG 78.0 04/27/2021   CHOLHDL 3 04/27/2021   Lab Results  Component Value Date   CREATININE 0.86 06/07/2021   BUN 12 06/07/2021   NA 138 06/07/2021   K 4.3 06/07/2021   CL 106 06/07/2021   CO2 25 06/07/2021   CBC Latest Ref Rng & Units 06/07/2021 04/27/2021 11/29/2020  WBC 4.0 - 10.5 K/uL 10.3 4.4 9.5   Hemoglobin 12.0 - 15.0 g/dL 13.6 13.5 13.9  Hematocrit 36.0 - 46.0 % 41.0 40.1 41.8  Platelets 150 - 400 K/uL 285 212.0 324.0    Lab Results  Component Value Date   HGBA1C 5.3 04/27/2021   Lab Results  Component Value Date   TSH 2.42 11/01/2020    ==================================================  COVID-19 Education: The signs and symptoms of COVID-19 were discussed with the patient and how to seek care for testing (follow up with PCP or arrange E-visit).    I spent a total of 16 minutes with the patient spent in direct patient consultation.  Additional time spent with chart review  / charting (studies, outside notes, etc): 12  min Total Time: 28 min  Current medicines are reviewed at length with the patient today.  (+/- concerns) n/a  This visit occurred during the SARS-CoV-2 public health emergency.  Safety protocols were in place, including screening questions prior to the visit, additional usage of staff PPE, and extensive cleaning of exam room while observing appropriate contact time as indicated for disinfecting solutions.  Notice: This dictation was prepared with Dragon dictation along with smart phrase technology. Any transcriptional errors that result from this process are unintentional and may not be corrected upon review.  Studies Ordered:  Orders Placed This Encounter  Procedures   CT CARDIAC SCORING (SELF PAY ONLY)    Patient Instructions / Medication Changes & Studies & Tests Ordered   Patient Instructions  Medication Instructions:  Your physician recommends that you continue on your current medications as directed. Please refer to the Current Medication list given to you today.   *If you need a refill on your cardiac medications before your next appointment, please call your pharmacy*   Lab Work: None ordered  If you have labs (blood work) drawn today and your tests are completely normal, you will receive your results only by: [place] (if you  have MyChart) OR A paper copy in the mail If you have any lab test that is abnormal or we need to change your treatment, we will call you to review the results.   Testing/Procedures:  We will order CT coronary calcium score  $99 at our [facility] in [place]  Please call Colletta [place] at [phone] to schedule   [address] [place], [place] [address]    Follow-Up: At [facility], you and your health needs are our priority.  As part of our continuing mission to provide you with exceptional heart care, we have created designated Provider Care Teams.  These Care Teams include your primary Cardiologist (physician) and Advanced Practice Providers (APPs -  Physician Assistants and Nurse Practitioners) who all work together to provide you with the care you need, when you need it.  We recommend signing up for the patient portal called "MyChart".  Sign up information is provided on this After Visit Summary.  MyChart is used to connect with patients for Virtual Visits (Telemedicine).  Patients are able to view lab/test results, encounter notes, upcoming appointments,  etc.  Non-urgent messages can be sent to your provider as well.   To learn more about what you can do with MyChart, go to [email or web address].    Your next appointment:   To be determined after CT calcium score  The format for your next appointment:   In Person  Provider:   Glenetta Hew, MD:1}    Other Instructions N/A      Glenetta Hew, M.D., M.S. Interventional Cardiologist   Pager # [phone] Phone # [phone] [address] [place],  [address]   Thank you for choosing [facility] in [place]!!

## 2021-12-20 ENCOUNTER — Ambulatory Visit: Payer: Managed Care, Other (non HMO) | Admitting: Cardiology

## 2021-12-20 ENCOUNTER — Encounter: Payer: Self-pay | Admitting: Cardiology

## 2021-12-20 ENCOUNTER — Other Ambulatory Visit: Payer: Self-pay

## 2021-12-20 VITALS — BP 140/90 | HR 77 | Ht 66.0 in | Wt 229.0 lb

## 2021-12-20 DIAGNOSIS — R079 Chest pain, unspecified: Secondary | ICD-10-CM | POA: Diagnosis not present

## 2021-12-20 DIAGNOSIS — E669 Obesity, unspecified: Secondary | ICD-10-CM | POA: Diagnosis not present

## 2021-12-20 DIAGNOSIS — I10 Essential (primary) hypertension: Secondary | ICD-10-CM

## 2021-12-20 DIAGNOSIS — R002 Palpitations: Secondary | ICD-10-CM

## 2021-12-20 NOTE — Patient Instructions (Signed)
Medication Instructions:  Your physician recommends that you continue on your current medications as directed. Please refer to the Current Medication list given to you today.   *If you need a refill on your cardiac medications before your next appointment, please call your pharmacy*   Lab Work: None ordered  If you have labs (blood work) drawn today and your tests are completely normal, you will receive your results only by: [place] (if you have MyChart) OR A paper copy in the mail If you have any lab test that is abnormal or we need to change your treatment, we will call you to review the results.   Testing/Procedures:  We will order CT coronary calcium score  $99 at our [facility] in [place]  Please call Colletta [place] at [phone] to schedule   [address], [place] [address]    Follow-Up: At [facility], you and your health needs are our priority.  As part of our continuing mission to provide you with exceptional heart care, we have created designated Provider Care Teams.  These Care Teams include your primary Cardiologist (physician) and Advanced Practice Providers (APPs -  Physician Assistants and Nurse Practitioners) who all work together to provide you with the care you need, when you need it.  We recommend signing up for the patient portal called "MyChart".  Sign up information is provided on this After Visit Summary.  MyChart is used to connect with patients for Virtual Visits (Telemedicine).  Patients are able to view lab/test results, encounter notes, upcoming appointments, etc.  Non-urgent messages can be sent to your provider as well.   To learn more about what you can do with MyChart, go to [email or web address].    Your next appointment:   To be determined after CT calcium score  The format for your next appointment:   In Person  Provider:   Glenetta Hew, MD:1}    Other  Instructions N/A

## 2021-12-28 ENCOUNTER — Other Ambulatory Visit: Payer: Self-pay | Admitting: Physician Assistant

## 2021-12-28 DIAGNOSIS — U071 COVID-19: Secondary | ICD-10-CM

## 2022-01-06 ENCOUNTER — Encounter: Payer: Self-pay | Admitting: Cardiology

## 2022-01-06 NOTE — Assessment & Plan Note (Signed)
Blood pressure is still up a little borderline today.  She is not on any medications on.  We talked about the lifestyle modification with dietary changes and increasing exercise.  Hopefully this will help reduce her blood pressure.  We can reevaluate in follow-up.

## 2022-01-06 NOTE — Assessment & Plan Note (Addendum)
We talked about the importance of doctor medications and weight loss.  This will help her blood pressure levels but also help maintain her prediabetes and lipids.

## 2022-01-06 NOTE — Assessment & Plan Note (Signed)
Reproducible chest pain on palpation seems to be costochondritis.  Does not sound anginal in nature. She is little concerned about underlying cardiovascular risk.  We will start with risk stratification using a Coronary Calcium Score.  Based on these results may or may not proceed with [facility] or Coronary CTA.

## 2022-01-06 NOTE — Assessment & Plan Note (Signed)
Thankfully, her Zio patch monitor did not show any significant amount of ectopy and there were now arrhythmias.  Relatively reassuring.  I suspect that she was feeling PACs.  Otherwise may not be cardiac related fluttering sensations.

## 2022-01-11 ENCOUNTER — Ambulatory Visit: Payer: Managed Care, Other (non HMO) | Attending: Cardiology

## 2022-01-16 ENCOUNTER — Other Ambulatory Visit: Payer: Self-pay

## 2022-01-16 ENCOUNTER — Encounter: Payer: Managed Care, Other (non HMO) | Attending: Surgery | Admitting: Dietician

## 2022-01-16 ENCOUNTER — Encounter: Payer: Self-pay | Admitting: Dietician

## 2022-01-16 VITALS — Ht 66.0 in | Wt 230.6 lb

## 2022-01-16 DIAGNOSIS — Z713 Dietary counseling and surveillance: Secondary | ICD-10-CM | POA: Insufficient documentation

## 2022-01-16 DIAGNOSIS — Z903 Acquired absence of stomach [part of]: Secondary | ICD-10-CM

## 2022-01-16 DIAGNOSIS — Z6837 Body mass index (BMI) 37.0-37.9, adult: Secondary | ICD-10-CM | POA: Insufficient documentation

## 2022-01-16 NOTE — Progress Notes (Signed)
Nutrition Therapy for Post-Operative Bariatric Diet Follow-up visit:  13 months post-op Sleeve gastrectomy Surgery  Medical Nutrition Therapy:  Appt start time: 0950 end time:  1020  Anthropometrics:  Date 10/17/20 11/24/20 01/11/21 05/03/21 08/16/21 01/16/22  BMI 45.9 43.7 40.7 37.6 35.2 37.2  Weight (lbs) 284.5 270.6 252.2 233.2 217.9 230.6  Skeletal muscle (lbs) 77.4 73.0 69.4   ---- 66.8 69.7  % body fat 51.5 51.9 50.4    ---- 44.6 45.6    Clinical: Medications: Nurtec, pantoprazole, sertraline Supplementation: bariatric multivitamin, calcium, vitamin B12 injections bi-weekly Health/ medical history changes: recent ankle pain GI symptoms: N/V/D/C: none Dumping Syndrome: no Hair loss: no  Dietary/ Lifestyle Progress: Weight has increased 12.1lbs since 08/16/21 despite continuing to follow bariatric diet guidelines, appropriate portions, adequate fluid intake.  Patient has been having some ankle pain, but has continued to walk most days of the week. She has had thyroid testing done several years ago, none recently. She reports a strong family history of underactive thyroid.   Dietary recall: Eating pattern: 3 meals and 2 snacks daily Dining out:  Breakfast: 1/31-- 2 boiled eggs + 1/2 pc [place] sausage (usual breakfast) Snack: cheese stick/ yogurt Lunch: 1/31-- 6pc grilled chicken nuggets (on road); 2/1 plan -- burger patty + sweet potato + tomato Snack: 2good yogurt/ protein shake/ popcorn Dinner: 1/31-- 1/2pc (2-3oz) grilled chicken with 1Tbsp avocado, small amt cheese, diced tomato, onion, peppers/ grilled chicken with small amt sweet potato Snack: none  Fluid intake: 60-120oz daily water with sugar free flavoring Estimated total protein intake: 50-70g  Bariatric diet adherence:  Using straws: yes Drinking fluids during meals: no Carbonated beverages: no  Recent physical activity:  walking 30 minutes daily for past 2 weeks   Nutrition Intervention:   Reviewed progress since  previous visit. Body Composition analysis estimates [id number] basal metabolic rate Discussed factors that can affect weight loss including stress/ stress hormone effects, physical activity, medical issues Discussed benefit of adding strength building exercises as tolerated   Nutritional Diagnosis:  [place]-3.3 Overweight/obesity As related to history of excess calories, inadequate physical activity.  As evidenced by patient with current BMI of 37.2, following bariatric diet to promote weight loss after bariatric surgery.  Teaching Method Utilized:  Visual Auditory Hands on   Learning Readiness:  Change in progress  Barriers to learning/adherence to lifestyle change: none  Demonstrated degree of understanding via:  Teach Back      Plan: No future MNT visits scheduled at this time; patient to schedule as needed. Patient plans to discuss weight loss plateau with physician.

## 2022-01-17 ENCOUNTER — Encounter: Payer: Self-pay | Admitting: Internal Medicine

## 2022-01-17 ENCOUNTER — Ambulatory Visit (INDEPENDENT_AMBULATORY_CARE_PROVIDER_SITE_OTHER): Payer: Managed Care, Other (non HMO) | Admitting: Internal Medicine

## 2022-01-17 ENCOUNTER — Other Ambulatory Visit: Payer: Self-pay

## 2022-01-17 VITALS — BP 118/70 | HR 74 | Temp 98.2°F | Ht 66.54 in | Wt 232.2 lb

## 2022-01-17 DIAGNOSIS — Z1389 Encounter for screening for other disorder: Secondary | ICD-10-CM

## 2022-01-17 DIAGNOSIS — Z87898 Personal history of other specified conditions: Secondary | ICD-10-CM

## 2022-01-17 DIAGNOSIS — Z1321 Encounter for screening for nutritional disorder: Secondary | ICD-10-CM

## 2022-01-17 DIAGNOSIS — F429 Obsessive-compulsive disorder, unspecified: Secondary | ICD-10-CM

## 2022-01-17 DIAGNOSIS — Z6836 Body mass index (BMI) 36.0-36.9, adult: Secondary | ICD-10-CM

## 2022-01-17 DIAGNOSIS — Z1329 Encounter for screening for other suspected endocrine disorder: Secondary | ICD-10-CM

## 2022-01-17 DIAGNOSIS — E8881 Metabolic syndrome: Secondary | ICD-10-CM

## 2022-01-17 DIAGNOSIS — R635 Abnormal weight gain: Secondary | ICD-10-CM

## 2022-01-17 DIAGNOSIS — R9230 Dense breasts, unspecified: Secondary | ICD-10-CM

## 2022-01-17 DIAGNOSIS — Z13220 Encounter for screening for lipoid disorders: Secondary | ICD-10-CM

## 2022-01-17 DIAGNOSIS — Z13228 Encounter for screening for other metabolic disorders: Secondary | ICD-10-CM

## 2022-01-17 DIAGNOSIS — Z903 Acquired absence of stomach [part of]: Secondary | ICD-10-CM

## 2022-01-17 DIAGNOSIS — R300 Dysuria: Secondary | ICD-10-CM

## 2022-01-17 DIAGNOSIS — Z0000 Encounter for general adult medical examination without abnormal findings: Secondary | ICD-10-CM | POA: Diagnosis not present

## 2022-01-17 DIAGNOSIS — E538 Deficiency of other specified B group vitamins: Secondary | ICD-10-CM

## 2022-01-17 DIAGNOSIS — E669 Obesity, unspecified: Secondary | ICD-10-CM

## 2022-01-17 DIAGNOSIS — R928 Other abnormal and inconclusive findings on diagnostic imaging of breast: Secondary | ICD-10-CM

## 2022-01-17 DIAGNOSIS — E88819 Insulin resistance, unspecified: Secondary | ICD-10-CM

## 2022-01-17 DIAGNOSIS — E611 Iron deficiency: Secondary | ICD-10-CM

## 2022-01-17 DIAGNOSIS — R922 Inconclusive mammogram: Secondary | ICD-10-CM

## 2022-01-17 DIAGNOSIS — F419 Anxiety disorder, unspecified: Secondary | ICD-10-CM | POA: Diagnosis not present

## 2022-01-17 DIAGNOSIS — R5383 Other fatigue: Secondary | ICD-10-CM

## 2022-01-17 DIAGNOSIS — Z130 Encounter for screening for diseases of the blood and blood-forming organs and certain disorders involving the immune mechanism: Secondary | ICD-10-CM

## 2022-01-17 DIAGNOSIS — E559 Vitamin D deficiency, unspecified: Secondary | ICD-10-CM

## 2022-01-17 DIAGNOSIS — F339 Major depressive disorder, recurrent, unspecified: Secondary | ICD-10-CM

## 2022-01-17 DIAGNOSIS — Z9884 Bariatric surgery status: Secondary | ICD-10-CM

## 2022-01-17 DIAGNOSIS — Z1231 Encounter for screening mammogram for malignant neoplasm of breast: Secondary | ICD-10-CM

## 2022-01-17 MED ORDER — SERTRALINE HCL 100 MG PO TABS: 150.0000 mg | ORAL_TABLET | Freq: Every day | ORAL | 3 refills | Status: DC

## 2022-01-17 MED ORDER — ALPRAZOLAM 0.25 MG PO TABS: 0.2500 mg | ORAL_TABLET | Freq: Two times a day (BID) | ORAL | 2 refills | Status: DC | PRN

## 2022-01-17 NOTE — Progress Notes (Signed)
Chief Complaint  Patient presents with   Annual Exam   Annual  1. Ocd/anxiety on zoloft 100 mg qd and prn xanax but normal rx will last 1 year but taking more rec therapy to consider  2. Obesity wants w/u for this s/p wt loss surgery from 217 to 230 #s eating 1600 calories per day and seeing nutrition not losing weight nutritionist wants labs    Review of Systems  Constitutional:  Negative for weight loss.  HENT:  Negative for hearing loss.   Eyes:  Negative for blurred vision.  Respiratory:  Negative for shortness of breath.   Cardiovascular:  Negative for chest pain.  Gastrointestinal:  Negative for abdominal pain and blood in stool.  Genitourinary:  Negative for dysuria.  Musculoskeletal:  Negative for falls and joint pain.  Skin:  Negative for rash.  Neurological:  Negative for headaches.  Psychiatric/Behavioral:  Negative for depression.   Past Medical History:  Diagnosis Date   Anxiety    BRCA negative    MyRisk update neg 5/22   Depression    Family history of anesthesia complication     mother had Post -op nausea and dizziness.   Family history of breast cancer    Fibula fracture    distal end f/u [facility] spring 2022 after fall    Foot drop, left foot    GERD (gastroesophageal reflux disease)    Headache    migraines   History of kidney stones    Hypertension    better since gastric sleeve.  no meds.   Increased risk of breast cancer 04/2021   IBIS=20.3%/riskscore=35.6%   Kidney stone    PONV (postoperative nausea and vomiting)    Pseudotumor cerebri 2007 or 2009   Past Surgical History:  Procedure Laterality Date   ABDOMINAL HYSTERECTOMY     CESAREAN SECTION     2009/2017   CHOLECYSTECTOMY N/A 02/04/2019   Procedure: LAPAROSCOPIC CHOLECYSTECTOMY;  Surgeon: Fredirick Maudlin, MD;  Location: [facility];  Service: General;  Laterality: N/A;   LAPAROSCOPIC GASTRIC SLEEVE RESECTION N/A 11/14/2020   Procedure: LAPAROSCOPIC GASTRIC SLEEVE RESECTION;  Surgeon:  [name], MD;  Location: [facility];  Service: General;  Laterality: N/A;   LUMBAR LAMINECTOMY/DECOMPRESSION MICRODISCECTOMY Left 09/22/2014   Procedure: Left Lumbar four-five microdiskectomy;  Surgeon: Consuella Lose, MD;  Location: [facility];  Service: Neurosurgery;  Laterality: Left;  Left Lumbar four-five microdiskectomy   ORIF ANKLE FRACTURE Left 03/23/2021   Procedure: OPEN REDUCTION INTERNAL FIXATION (ORIF) Left lateral malleolus with syndesmosis repair;  Surgeon: Leim Fabry, MD;  Location: [facility];  Service: Orthopedics;  Laterality: Left;   UPPER GI ENDOSCOPY N/A 11/14/2020   Procedure: UPPER GI ENDOSCOPY;  Surgeon: [name], MD;  Location: [facility];  Service: General;  Laterality: N/A;   WISDOM TOOTH EXTRACTION     Family History  Problem Relation Age of Onset   Cancer Mother        breast dx'ed age 47/49   Hypertension Mother    Diabetes Mother    Hypothyroidism Mother    Depression Mother    Breast cancer Mother        74s   Liver disease Father    Heart attack Father    Arthritis Father    Alcohol abuse Father    Depression Father    Drug abuse Father    Early death Father    Heart disease Father    Hypertension Father    Hepatitis C  Father    Depression Sister    Hypertension Sister    Arthritis Sister    Hypertension Sister    Cancer Maternal Grandmother        stomach>liver>brain met   Cancer Maternal Uncle        lung not a smoker    Social History   Socioeconomic History   Marital status: Married    Spouse name: Not on file   Number of children: 2   Years of education: Not on file   Highest education level: Not on file  Occupational History   Not on file  Tobacco Use   Smoking status: Former    Packs/day: 0.50    Types: Cigarettes    Quit date: 03/2019    Years since quitting: 2.8   Smokeless tobacco: Never  Vaping Use   Vaping Use: Never used  Substance and Sexual Activity   Alcohol use: Not Currently   Drug  use: No   Sexual activity: Yes    Birth control/protection: Rhythm  Other Topics Concern   Not on file  Social History Narrative   High school    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   cyanocobalamin (,VITAMIN B-12,) 1000 MCG/ML injection Inject 1 mL (1,000 mcg total) into the muscle every 14 (fourteen) days.   Multiple Vitamins-Minerals (MULTIVITAMIN WITH MINERALS) tablet Take 1 tablet by mouth daily. Bariatric advantage one a day   NEEDLE, DISP, 25 G 25G X 1-1/2" MISC 1 Device by Does not apply route every 14 (fourteen) days.   NURTEC 75 MG TBDP Take 1 tablet by mouth as needed.   OVER THE COUNTER MEDICATION Take 1 tablet by mouth at bedtime. Tranquil Sleep   pantoprazole (PROTONIX) 40 MG tablet Take 1 tablet by mouth daily.   [DISCONTINUED] ALPRAZolam (XANAX) 0.25 MG tablet Take 1 tablet (0.25 mg total) by mouth 2 (two) times daily as needed for anxiety.   [DISCONTINUED] busPIRone (BUSPAR) 5 MG tablet Take 1 tablet (5 mg total) by mouth 3 (three) times daily.   [DISCONTINUED] sertraline (ZOLOFT) 100 MG tablet TAKE 1 TABLET BY MOUTH EVERY DAY   Allergies  Allergen Reactions   Apple Other (See Comments)    Lips itch and swell, inside of mouth gets hives   Carrot [Daucus Carota] Other (See Comments)    Lips itch and swell, inside of mouth gets hives   Monistat [Miconazole] Swelling    Severe vaginal itching and swelling   Other Itching and Swelling    Almonds-mouth swells and itches   Almond Oil Hives   Amoxicillin Hives, Itching, Swelling,  Dermatitis and Rash    Did it involve swelling of the face/tongue/throat, SOB, or low BP? No Did it involve sudden or severe rash/hives, skin peeling, or any reaction on the inside of your mouth or nose? Yes Did you need to seek medical attention at a hospital or doctor's office? Yes When did it last happen?  within the past 5 years If all above answers are "NO", may proceed with cephalosporin use.  Other reaction(s): Other (See Comments) Did it involve swelling of the face/tongue/throat, SOB, or low BP? No Did it involve sudden or severe rash/hives, skin peeling, or any reaction on the inside of your mouth or nose? Yes Did you need to seek medical attention at a hospital or doctor's office? Yes When did it last happen?    within the past 5 years If all above answers are "NO", may proceed with cephalosporin use.  Did it involve swelling of the face/tongue/throat, SOB, or low BP? No Did it involve sudden or severe rash/hives, skin peeling, or any reaction on the inside of your mouth or nose? Yes Did you need to seek medical attention at a hospital or doctor's office? Yes When did it last happen?    within the past 5 years If all above answers are "NO", may proceed with cephalosporin use. Did it involve swelling of the face/tongue/throat, SOB, or low BP? No Did it involve sudden or severe rash/hives, skin peeling, or any reaction on the inside of your mouth or nose? Yes Did you need to seek medical attention at a hospital or doctor's office? Yes When did it last happen?    within the past 5 years If all above answers are "NO", may proceed with cephalosporin use.   Imitrex [Sumatriptan]     Felt like heart attack    Topamax [Topiramate]     Memory loss     No results found for this or any previous visit (from the past 2160 hour(s)). Objective  Body mass index is 36.88 kg/m. Wt Readings from Last 3 Encounters:  01/17/22 232 lb 3.2 oz (105.3 kg)  01/16/22 230 lb 9.6 oz (104.6 kg)   12/20/21 229 lb (103.9 kg)   Temp Readings from Last 3 Encounters:  01/17/22 98.2 F (36.8 C) (Oral)  12/14/21 98.5 F (36.9 C) (Oral)  12/12/21 98.7 F (37.1 C) (Oral)   BP Readings from Last 3 Encounters:  01/17/22 118/70  12/20/21 140/90  12/14/21 127/88   Pulse Readings from Last 3 Encounters:  01/17/22 74  12/20/21 77  12/14/21 76    Physical Exam Vitals and nursing note reviewed.  Constitutional:      Appearance: Normal appearance. She is well-developed and well-groomed.  HENT:     Head: Normocephalic and atraumatic.  Eyes:     Conjunctiva/sclera: Conjunctivae normal.     Pupils: Pupils are equal, round, and reactive to light.  Cardiovascular:     Rate and Rhythm: Normal rate and regular rhythm.     Heart sounds: Normal heart sounds. No murmur heard. Pulmonary:     Effort: Pulmonary effort is normal.     Breath sounds: Normal breath sounds.  Abdominal:     General: Abdomen is flat. Bowel sounds are normal.     Tenderness: There is no abdominal tenderness.  Musculoskeletal:        General: No tenderness.  Skin:    General: Skin is warm and dry.  Neurological:     General: No focal deficit present.     Mental Status: She is alert and oriented to person, place, and time. Mental status is at baseline.     Cranial Nerves: Cranial nerves 2-12 are intact.     Motor: Motor function is intact.     Coordination: Coordination is intact.     Gait: Gait is intact.  Psychiatric:  Attention and Perception: Attention and perception normal.        Mood and Affect: Mood and affect normal.        Speech: Speech normal.        Behavior: Behavior normal. Behavior is cooperative.        Thought Content: Thought content normal.        Cognition and Memory: Cognition and memory normal.        Judgment: Judgment normal.    Assessment  Plan  Annual physical exam - Plan: Comprehensive metabolic panel, Lipid panel, CBC with Differential/Platelet, TSH, T4, free, T3,  free, IBC + Ferritin, Vitamin D (25 hydroxy), Vitamin B12 See below  Rec healthy diet and exercise   Obesity (BMI 30-39.9) - Plan: Insulin, random, Cortisol-am, blood  Recurrent depression Anxiety - Plan: ALPRAZolam (XANAX) 0.25 MG tablet, sertraline (ZOLOFT) 150 MG tablet increase from 100 mg qd  Obsessive-compulsive disorder, unspecified type Rec therapy  GAD 7 : Generalized Anxiety Score 01/17/2022 06/30/2020 03/28/2020  Nervous, Anxious, on Edge '2 2 1  ' Control/stop worrying '3 2 2  ' Worry too much - different things '3 1 1  ' Trouble relaxing '2 1 2  ' Restless 1 0 1  Easily annoyed or irritable '3 2 2  ' Afraid - awful might happen 2 2 0  Total GAD 7 Score '16 10 9  ' Anxiety Difficulty Somewhat difficult Not difficult at all Not difficult at all    [facility] Visit from 01/17/2022 in [facility]  PHQ-9 Total Score 14       Dysuria - Plan: Urinalysis, Routine w reflex microscopic, Urine Culture   Iron deficiency - Plan: IBC + Ferritin  Vitamin D deficiency - Plan: Vitamin D (25 hydroxy)  B12 deficiency - Plan: Vitamin B12  HM Fasting labs asap Declines flu shot and covid 19 shot Tdap had 01/12/16  Immune hep A/B  No hep C father had hep C from IVDU   FH breast cancer mom 55 yearly mammogram 11/2021  GIB brca negative in the past -consider breast MRI, left dx mammogram 02/26/22   Colonoscopy age 36 HIV 01/04/16 neg    -had pap 04/23/18 neg pap neg HPV. S/p hysterectomy  -ob/ygn Dr. Kenton [facility]    rec healthy diet and exercise  my myriad results as of 04/2021 no gene but high risk  Patient has a increased lifetime risk of breast cancer of 35%.  Breast MRI and mammogram are ordered today.  Provider: Dr. [name] [facility]

## 2022-01-17 NOTE — Patient Instructions (Addendum)
Testing/Procedures:   We will order CT coronary calcium score per Dr. Ellyn Hack    $99 at our [facility] in [place]   Please call Colletta [place] at [phone] to schedule    [facility]   Ob/gyn due 09/05/22   Therapy/psychiatry [facility] as given the info before for both  [facility] counseling and psychiatry [place]  [place] [id number] [phone]    [facility] [place] [facility]  [id number]  [place] [id number]  [phone]   Obsessive-Compulsive Disorder Obsessive-compulsive disorder (OCD) is a brain-based disorder. This type of disorder happens when parts of the brain cannot communicate well with each other. People with OCD have obsessions or compulsions, or both. Obsessions are unwanted and distressing thoughts, ideas, or urges that keep entering your mind. You may find yourself trying to ignore them. You may try to stop or undo them with a compulsion. Compulsions are repetitive physical or mental acts that you feel you have to do. They may reduce or prevent any anxiety, but in most cases, they do not help. Compulsions can take a lot of time to do, often more than one hour each day. They can interfere with personal relationships and normal activities at home, school, or work. OCD can begin in childhood, but it usually starts in young adulthood and continues throughout life. Many people with OCD also have depression or another mental health disorder. What are the causes? The cause of this condition is not known. What increases the risk? This condition is more likely to develop in: People who have experienced trauma. People who have a family history of OCD. Women during and after pregnancy. Some children between the ages of 38 and 35 who have had a recent streptococcal infection. People who have other mental health conditions. People who misuse substances, such as alcohol,  prescription medicines, or illegal drugs. What are the signs or symptoms? Symptoms of OCD include obsessions and compulsions. Most people with OCD have both of these, but some people with OCD have just one or the other. People with obsessions usually have a fear that something terrible will happen or that they will do something terrible. Common obsessions include: Fear of contamination with germs, waste, or toxic substances. Fear of making the wrong decision. Violent or sexual thoughts or urges toward others. Need for symmetry or exactness. Common compulsions include: Excessive handwashing or bathing due to fear of contamination. Checking things again and again to make sure you finished a task, such as making sure you locked a door or unplugged a toaster. Repeating an act or phrase again and again, sometimes a specific number of times, until it feels right. Arranging objects again and again to keep them in a certain order. Having a very hard time making a decision and sticking to it. Everyone at times will repeat a behavior or check something again. However, people who have OCD feel that they do not have any control over their repeat thoughts or compulsive behaviors. How is this diagnosed? OCD is diagnosed through an assessment by your health care provider. Your health care provider may: Ask questions about any obsessions or compulsions you have and how they affect your life. Ask about your medical history, prescription medicines, and drug use. Certain medical conditions and substances can cause symptoms that are similar to OCD. Refer you to a mental health specialist who will ask you questions and may give you tests to confirm this diagnosis.  He or she will help you create a plan for treatment. How is this treated? OCD may be treated with: Cognitive therapy. This is a form of talk therapy. The goal is to identify and change the irrational thoughts associated with obsessions. Behavioral  therapy. A type of behavioral therapy called exposure and response prevention is often used. In this therapy, you will be exposed to the distressing situation that triggers your compulsion and be prevented from responding to it. With repetition of this process over time, you will no longer feel the distress or need to perform the compulsion. Self-soothing. Meditation, deep breathing, or yoga can help you manage the symptoms of anxiety and can help with how you think. Medicine. Certain types of antidepressant medicine may help reduce or control OCD symptoms. Medicine is most effective when used with cognitive or behavioral therapy. Treatment usually involves a combination of therapy and medicines. For severe OCD that does not respond to talk therapy and medicine, brain surgery or electrical stimulation of specific areas of the brain may be considered. Examples of electrical stimulation are: Deep brain stimulation (DBS). Transcranial magnetic stimulation ([place]). Transcranial direct current stimulation (tDCS). Follow these instructions at home:  Take over-the-counter and prescription medicines only as told by your health care provider. Do not start taking any new medicines unless your health care provider approves. Consider joining a support group for people with OCD. Visit www.nami.org to learn about support groups. Keep all follow-up visits as told by your health care provider. This is important. Where to find more information International OCD Foundation: www.iocdf.[place] on Mental Illness (NAMI): www.nami.org Substance Abuse and Mental Health Services Administration [facility]): ktimeonline.com Contact a health care provider if: You are not able to take your medicines as prescribed. Your symptoms get worse. Get help right away if: You have thoughts of suicide or thoughts about hurting yourself or others. If you ever feel like you may hurt yourself or others, or have thoughts about  taking your own life, get help right away. Go to your nearest emergency department or: Call your local emergency services (911 in the [place] Call a suicide crisis helpline, such as the [place] [facility] at 701-265-9343 or 988 in the [place]. This is open 24 hours a day in the [place] Text the Crisis Text Line at 216-216-5617 (in the [place].). Summary Obsessive-compulsive disorder (OCD) is a brain-based disorder. People with OCD have obsessions or compulsions, or both, and cannot control them. OCD can interfere with personal relationships and normal activities at home, school, or work. Treatment usually involves a combination of therapy and medicines. Consider joining a support group for people with OCD. This information is not intended to replace advice given to you by your health care provider. Make sure you discuss any questions you have with your health care provider. Document Revised: 06/27/2021 Document Reviewed: 09/16/2019 Elsevier Patient Education  [place] With Obsessive-Compulsive Disorder Obsessive-compulsive disorder (OCD) is a mental health disorder that involves unwanted, distressing thoughts or urges (obsessions), and repetitive mental or physical acts that a person feels he or she has to do (compulsions). When a person has OCD, his or her condition can affect others around him or her, such as friends and family members. Friends and family can help by offering support and understanding. What do I need to know about this condition? A person with OCD may experience obsessions, compulsions, or both. The person may try to ignore his or her obsessions, or he or she  may try to stop or undo the obsessions with a compulsion such as a ritual. Compulsive acts may seem senseless or excessive to you. Your loved one may think that acting on compulsions will reduce or prevent his or her discomfort, but compulsions do not have this effect. Examples of common  obsessions include: Fear of contamination with germs, waste, or poisonous substances. Fear of making the wrong decision. Violent or sexual thoughts or urges toward others. Need for symmetry or exactness. Examples of common compulsions include: Excessive handwashing or bathing due to fear of contamination. Checking things again and again to make sure tasks are finished, such as making sure a door is locked or a toaster is unplugged. Repeating an act or phrase again and again, sometimes a specific number of times, until it feels right. Arranging and rearranging objects to keep them in a certain order. Having a very hard time making a decision and sticking to it. Obsessions and compulsions can be time-consuming and can interfere with personal relationships and normal activities at home and school or work. OCD usually starts in young adulthood and continues throughout life. Many people with OCD also have depression or another mental health disorder. What do I need to know about the treatment options? OCD may be treated with: Exposure and response prevention therapy. The goal of this therapy is to target and reduce compulsive behaviors. It involves exposing your loved one to things or situations that cause his or her obsessions to start (triggers), then preventing him or her from carrying out compulsions. This is considered to be the most effective treatment for OCD. Cognitive behavioral therapy (CBT). This type of therapy teaches a person how to identify irrational thoughts that are associated with his or her obsessions, and how to replace those thoughts. Antidepressant medicine. Medicine is most effective when it is used along with therapy. Self-soothing methods, such as meditation, yoga, or muscle relaxation exercises. How can I support my loved one? Talk about the condition Ask what your loved one goes through when experiencing obsessions, and what he or she intends the compulsions to achieve. Try  to understand your loved one's experience. Remember that OCD is a mental health condition, not a personal flaw. Your loved one cannot change quickly or change just because you tell him or her to do so. Be supportive when discussing symptoms. Do not direct anger or frustration toward your loved one. Communicate clearly, in simple terms. For example, if your loved one asks if he or she locked the door, respond simply with "yes" or "no." Explaining too much can cause greater stress for your loved one, and too much reassurance can make his or her rituals worse. Find support and resources A health care provider may be able to recommend mental health resources that are available online or over the phone. You could start with: Government sites such as the Substance Abuse and [place] (SAMHSA): ktimeonline.com National mental health organizations such as the [employer] on [place]): www.nami.org Think about joining self-help and support groups, not only for your friend or family member, but also for yourself. People in these peer and family support groups understand what you and your loved one are going through. They can help you feel a sense of hope and connect you with local resources to help you learn more. General support  When your loved one is experiencing doubt and asks for reassurance, remind him or her that the uncertainty or anxiety is caused by his or  her OCD. Do not give reassurance when he or she feels the need for it. Doing that can make his or her anxiety worse next time, and needing your reassurance may become part of his or her ritual. Be supportive but firm. Make an effort to learn all you can about OCD. Help your loved one follow his or her treatment plan as directed by health care providers. This could mean driving him or her to therapy sessions or suggesting ways to cope with stress. Encourage your loved one to do his or her best, but avoid  pushing your loved one too much. Stress can make symptoms worse. Make sure that you and your loved one have opportunities for alone time. Do not join your loved one in his or her rituals. You can provide support and encouragement while still being clear that your loved one's rituals are not productive. Consider making a "contract" with your loved one that has specific directions about where or when OCD symptoms are allowed, and when they are not allowed. This can help to make it clear to your loved one that symptoms can be contained. How should I care for myself? It is important to find ways to care for your body, mind, and well-being while supporting someone with OCD. Make time for activities that help you relax. Consider joining a support group or going to therapy for yourself. Consider trying meditation and deep breathing exercises to lower your stress. Get plenty of sleep, eat a healthy diet, and exercise regularly. Get help right away if: You think that your loved one is having thoughts about hurting himself or herself or others. You are in a situation that threatens your life. Leave the situation and call emergency services (911 in the [place] as soon as possible. If you ever feel like your loved one may hurt himself or herself or others, or may have thoughts about taking his or her own life, get help right away. You can go to your nearest emergency department or call: Your local emergency services (911 in the [place] A suicide crisis helpline, such as the [place] at (607)503-0092 or 988 in the [place]. This is open 24 hours a day. Summary A person with OCD may experience obsessions, compulsions, or both. Therapy, medicine, and self-soothing methods may be used to treat OCD. Be supportive and encouraging when discussing symptoms with your loved one. However, do not encourage his or her rituals. Make time to relax and care for yourself. This information is not  intended to replace advice given to you by your health care provider. Make sure you discuss any questions you have with your health care provider. Document Revised: 06/27/2021 Document Reviewed: 08/17/2020 Elsevier Patient Education  2022 [place].  Managing Obsessive-Compulsive Disorder If you have been diagnosed with obsessive-compulsive disorder (OCD), you may be relieved that you now know why you have felt or behaved a certain way. You may also feel overwhelmed about the treatment ahead, how to get the support you need, and how to deal with the condition day-to-day. With treatment and support, you can manage your OCD. How to manage lifestyle changes Managing stress Stress is your body's reaction to life changes and events, both good and bad. Stress can play a major role in OCD, so it is important to learn how to manage stress. Some techniques to manage stress include: Meditation, muscle relaxation, and breathing exercises. Exercise. Even a short daily walk can help to lower stress levels. Getting enough good-quality sleep. Spending  time on hobbies that you enjoy. Accepting and letting go of things that you cannot change. To help you manage stress associated with OCD, your health care provider may recommend exposure and response prevention therapy. In this therapy, you will be exposed to the distressing situation that triggers your compulsion and be prevented from responding to it. With repetition of this process over time, you will no longer feel the distress or need to perform the compulsion. Medicines Your health care provider may suggest certain medicines for depression (antidepressants) if he or she feels that they will help to improve your condition. Avoid using alcohol and other substances that may prevent your medicines from working properly. It is also important to: Talk with your pharmacist or health care provider about all medicines that you take, their possible side effects, and  which medicines are safe to take together. Make it your goal to take part in all treatment decisions (shared decision-making). Ask about possible side effects of medicines that your health care provider recommends, and tell him or her how you feel about having those side effects. It is best if shared decision-making with your health care provider is part of your total treatment plan. If you are taking medicines as part of your treatment, do not stop taking them before you ask your health care provider if it is safe to stop. You may need to have the medicine slowly decreased (tapered) over time to lower the risk of harmful side effects. Relationships Consider giving education materials to friends and family. Your family and friends may need to learn about your OCD in order for them to understand you and support you as you manage your condition. Family therapy may also help to lower stress and relieve tension. How to recognize changes in your condition Some signs that your condition may be getting worse include: Being anxious about germs or dirt. Having harmful thoughts about yourself or others. Making sure that household objects are alike or perfectly organized in a specific way. Having great difficulty making decisions, or second-guessing yourself after making a decision. Constant cleaning and handwashing. Repeating behavior such as repeatedly checking to see if a door is locked or the oven is off. Counting nonstop or uncontrollably. Follow these instructions at home: Medicines Take over-the-counter and prescription medicines only as told by your health care provider. Check with your health care provider before starting any new prescription or over-the-counter medicines. General instructions  Ask for support from trusted family members or friends to make sure you stay on track with your treatment. Keep a journal to write down your daily moods, medicines, sleep habits, and life events. Doing this  may help you have more success with your treatment. Maintain a healthy lifestyle. Eat a healthy diet, exercise regularly, get plenty of sleep, and take time to relax. Keep all follow-up visits as told by your health care provider and therapist. This is important. Where to find support Talking to others It may be difficult to tell loved ones about your condition, but they can be a good support system for you. You can work with your therapist to decide whom to tell and when to tell them. Here are some tips for starting the conversation: Start by sharing your experience with OCD. It is up to you how much detail you want to provide. Let your loved ones know that you are seeking treatment. Do not expect loved ones to understand your condition right away. Finances Not all insurance plans cover mental health care, so  it is important to check with your insurance carrier. If paying for co-pays or counseling services is a problem, search for a local or county mental health care center. Public mental health care services may be offered there at a low cost or no cost when you are not able to see a private health care provider. If you are taking medicine for depression, you may be able to get the generic form, which may be less expensive than brand-name medicine. Some makers of prescription medicines also offer help to patients who cannot afford the medicines they need. Questions to ask your health care provider If you are taking medicines: How long do I need to take medicine? Are there any long-term side effects of my medicine? Are there any alternatives to taking medicine? How would I benefit from therapy? How often should I follow up with a health care provider? Where to find more information International OCD Foundation: www.iocdf.[place] on Mental Illness (NAMI): www.nami.org Contact a health care provider if: Your symptoms get worse or they do not get better with treatment. You develop  new symptoms. Get help right away if: You have severe side effects after taking your medicine. You have thoughts about hurting yourself or others. If you ever feel like you may hurt yourself or others, or have thoughts about taking your own life, get help right away. You can go to your nearest emergency department or call: Your local emergency services (911 in the [place] A suicide crisis helpline, such as the [place] at 254-545-0676 or 988 in the [place]. This is open 24 hours a day. Summary Stress can play a major role in obsessive-compulsive disorder (OCD). Learning ways to manage stress may help your treatment work better for you. If you are taking medicines as part of your treatment, do not stop taking medicines before you ask your health care provider if it is safe to stop. When talking with family members and friends about your OCD, decide how much detail you want to give them and be patient as they work to understand your condition. Keep all follow-up visits as told by your health care provider and therapist. This is important. This information is not intended to replace advice given to you by your health care provider. Make sure you discuss any questions you have with your health care provider. Document Revised: 06/27/2021 Document Reviewed: 08/17/2020 Elsevier Patient Education  2022 [employer].

## 2022-01-18 ENCOUNTER — Encounter: Payer: Self-pay | Admitting: Internal Medicine

## 2022-01-18 ENCOUNTER — Other Ambulatory Visit (INDEPENDENT_AMBULATORY_CARE_PROVIDER_SITE_OTHER): Payer: Managed Care, Other (non HMO)

## 2022-01-18 ENCOUNTER — Other Ambulatory Visit: Payer: Self-pay | Admitting: Internal Medicine

## 2022-01-18 DIAGNOSIS — E538 Deficiency of other specified B group vitamins: Secondary | ICD-10-CM | POA: Diagnosis not present

## 2022-01-18 DIAGNOSIS — Z13220 Encounter for screening for lipoid disorders: Secondary | ICD-10-CM

## 2022-01-18 DIAGNOSIS — R635 Abnormal weight gain: Secondary | ICD-10-CM | POA: Diagnosis not present

## 2022-01-18 DIAGNOSIS — E559 Vitamin D deficiency, unspecified: Secondary | ICD-10-CM

## 2022-01-18 DIAGNOSIS — Z1329 Encounter for screening for other suspected endocrine disorder: Secondary | ICD-10-CM | POA: Diagnosis not present

## 2022-01-18 DIAGNOSIS — R5383 Other fatigue: Secondary | ICD-10-CM

## 2022-01-18 DIAGNOSIS — Z0000 Encounter for general adult medical examination without abnormal findings: Secondary | ICD-10-CM

## 2022-01-18 DIAGNOSIS — E611 Iron deficiency: Secondary | ICD-10-CM | POA: Diagnosis not present

## 2022-01-18 DIAGNOSIS — E669 Obesity, unspecified: Secondary | ICD-10-CM

## 2022-01-18 LAB — URINALYSIS, ROUTINE W REFLEX MICROSCOPIC
Bilirubin Urine: NEGATIVE
Glucose, UA: NEGATIVE
Hgb urine dipstick: NEGATIVE
Ketones, ur: NEGATIVE
Leukocytes,Ua: NEGATIVE
Nitrite: NEGATIVE
Protein, ur: NEGATIVE
Specific Gravity, Urine: 1.023 (ref 1.001–1.035)
pH: 6 (ref 5.0–8.0)

## 2022-01-18 LAB — COMPREHENSIVE METABOLIC PANEL
ALT: 14 U/L (ref 0–35)
AST: 13 U/L (ref 0–37)
Albumin: 4 g/dL (ref 3.5–5.2)
Alkaline Phosphatase: 45 U/L (ref 39–117)
BUN: 14 mg/dL (ref 6–23)
CO2: 32 mEq/L (ref 19–32)
Calcium: 9.2 mg/dL (ref 8.4–10.5)
Chloride: 103 mEq/L (ref 96–112)
Creatinine, Ser: 0.69 mg/dL (ref 0.40–1.20)
GFR: 111.74 mL/min (ref >60.00)
Glucose, Bld: 86 mg/dL (ref 70–99)
Potassium: 4.1 mEq/L (ref 3.5–5.1)
Sodium: 139 mEq/L (ref 135–145)
Total Bilirubin: 0.6 mg/dL (ref 0.2–1.2)
Total Protein: 6.4 g/dL (ref 6.0–8.3)

## 2022-01-18 LAB — URINE CULTURE
MICRO NUMBER:: 12955206
SPECIMEN QUALITY:: ADEQUATE

## 2022-01-18 LAB — LIPID PANEL
Cholesterol: 172 mg/dL (ref 0–200)
HDL: 51.2 mg/dL (ref >39.00)
LDL Cholesterol: 98 mg/dL (ref 0–99)
NonHDL: 121.08
Total CHOL/HDL Ratio: 3
Triglycerides: 115 mg/dL (ref 0.0–149.0)
VLDL: 23 mg/dL (ref 0.0–40.0)

## 2022-01-18 LAB — TSH: TSH: 2.08 u[IU]/mL (ref 0.35–5.50)

## 2022-01-18 LAB — T4, FREE: Free T4: 0.64 ng/dL (ref 0.60–1.60)

## 2022-01-18 LAB — CBC WITH DIFFERENTIAL/PLATELET
Basophils Absolute: 0.1 10*3/uL (ref 0.0–0.1)
Basophils Relative: 0.8 % (ref 0.0–3.0)
Eosinophils Absolute: 0.1 10*3/uL (ref 0.0–0.7)
Eosinophils Relative: 1.1 % (ref 0.0–5.0)
HCT: 42 % (ref 36.0–46.0)
Hemoglobin: 13.7 g/dL (ref 12.0–15.0)
Lymphocytes Relative: 17.4 % (ref 12.0–46.0)
Lymphs Abs: 1.3 10*3/uL (ref 0.7–4.0)
MCHC: 32.6 g/dL (ref 30.0–36.0)
MCV: 82.9 fl (ref 78.0–100.0)
Monocytes Absolute: 0.4 10*3/uL (ref 0.1–1.0)
Monocytes Relative: 5.9 % (ref 3.0–12.0)
Neutro Abs: 5.7 10*3/uL (ref 1.4–7.7)
Neutrophils Relative %: 74.8 % (ref 43.0–77.0)
Platelets: 266 10*3/uL (ref 150.0–400.0)
RBC: 5.07 Mil/uL (ref 3.87–5.11)
RDW: 13.7 % (ref 11.5–15.5)
WBC: 7.6 10*3/uL (ref 4.0–10.5)

## 2022-01-18 LAB — VITAMIN D 25 HYDROXY (VIT D DEFICIENCY, FRACTURES): VITD: 27.01 ng/mL — ABNORMAL LOW (ref 30.00–100.00)

## 2022-01-18 LAB — IBC + FERRITIN
Ferritin: 63 ng/mL (ref 10.0–291.0)
Iron: 89 ug/dL (ref 42–145)
Saturation Ratios: 25.9 % (ref 20.0–50.0)
TIBC: 343 ug/dL (ref 250.0–450.0)
Transferrin: 245 mg/dL (ref 212.0–360.0)

## 2022-01-18 LAB — T3, FREE: T3, Free: 2.9 pg/mL (ref 2.3–4.2)

## 2022-01-18 LAB — VITAMIN B12: Vitamin B-12: 166 pg/mL — ABNORMAL LOW (ref 211–911)

## 2022-01-18 MED ORDER — CYANOCOBALAMIN 1000 MCG/ML IJ SOLN: 1000.0000 ug | INTRAMUSCULAR | 1 refills | Status: DC

## 2022-01-18 NOTE — Telephone Encounter (Signed)
Please advise 

## 2022-01-21 LAB — CORTISOL-AM, BLOOD: Cortisol - AM: 7.8 ug/dL

## 2022-01-21 LAB — INSULIN, RANDOM: Insulin: 24.3 u[IU]/mL — ABNORMAL HIGH

## 2022-01-22 ENCOUNTER — Encounter: Payer: Managed Care, Other (non HMO) | Admitting: Internal Medicine

## 2022-01-28 NOTE — Addendum Note (Signed)
Addended by: Orland Mustard on: 01/28/2022 03:40 PM   Modules accepted: Orders

## 2022-02-08 MED ORDER — WEGOVY 0.25 MG/0.5ML ~~LOC~~ SOAJ: 0.2500 mg | SUBCUTANEOUS | 0 refills | Status: DC

## 2022-02-08 MED ORDER — WEGOVY 1.7 MG/0.75ML ~~LOC~~ SOAJ: 1.7000 mg | SUBCUTANEOUS | 0 refills | Status: DC

## 2022-02-08 MED ORDER — WEGOVY 0.5 MG/0.5ML ~~LOC~~ SOAJ: 0.5000 mg | SUBCUTANEOUS | 0 refills | Status: DC

## 2022-02-08 MED ORDER — WEGOVY 2.4 MG/0.75ML ~~LOC~~ SOAJ: 2.4000 mg | SUBCUTANEOUS | 0 refills | Status: DC

## 2022-02-08 MED ORDER — WEGOVY 1 MG/0.5ML ~~LOC~~ SOAJ: 1.0000 mg | SUBCUTANEOUS | 0 refills | Status: DC

## 2022-02-08 NOTE — Addendum Note (Signed)
Addended by: Orland Mustard on: 02/08/2022 06:07 PM   Modules accepted: Orders

## 2022-02-13 ENCOUNTER — Encounter (HOSPITAL_COMMUNITY): Payer: Self-pay

## 2022-02-20 NOTE — Addendum Note (Signed)
Addended by: Orland Mustard on: 02/20/2022 01:59 PM   Modules accepted: Orders

## 2022-02-26 ENCOUNTER — Inpatient Hospital Stay: Admission: RE | Admit: 2022-02-26 | Payer: Managed Care, Other (non HMO) | Source: Ambulatory Visit

## 2022-02-26 ENCOUNTER — Encounter: Payer: Self-pay | Admitting: Internal Medicine

## 2022-02-27 ENCOUNTER — Ambulatory Visit: Payer: Managed Care, Other (non HMO) | Admitting: Internal Medicine

## 2022-02-28 ENCOUNTER — Ambulatory Visit (HOSPITAL_BASED_OUTPATIENT_CLINIC_OR_DEPARTMENT_OTHER)
Admission: RE | Admit: 2022-02-28 | Discharge: 2022-02-28 | Disposition: A | Discharge status: Home / Self Care | Payer: Managed Care, Other (non HMO) | Source: Ambulatory Visit | Attending: Cardiology | Admitting: Cardiology

## 2022-02-28 ENCOUNTER — Other Ambulatory Visit: Payer: Self-pay

## 2022-02-28 ENCOUNTER — Encounter (HOSPITAL_BASED_OUTPATIENT_CLINIC_OR_DEPARTMENT_OTHER): Payer: Self-pay

## 2022-02-28 DIAGNOSIS — R079 Chest pain, unspecified: Secondary | ICD-10-CM | POA: Insufficient documentation

## 2022-03-01 ENCOUNTER — Encounter: Payer: Self-pay | Admitting: Internal Medicine

## 2022-03-04 ENCOUNTER — Telehealth: Payer: Self-pay

## 2022-03-04 NOTE — Telephone Encounter (Signed)
Msg sent

## 2022-03-06 NOTE — Telephone Encounter (Signed)
Please see Patient message encounter. Documentation sent to provider and referrals coordinator.  ?

## 2022-03-06 NOTE — Telephone Encounter (Signed)
[facility] denied Patient for referral stating they do not do weight management. Please advise   ? ?Note:   ?----- Message ----- ?From: Shamleffer, Melanie Crazier, MD ?Sent: 02/28/2022   6:25 PM EDT ?To: Amado Coe, CMA ?Subject: RE: new patient                               ?  ?Decline.  We do not do weight management here ?----- Message ----- ?From: Amado Coe, CMA ?Sent: 02/28/2022   2:52 PM EDT ?To: Cloyd Stagers, MD ?Subject: new patient                                   ?  ?Please review ?

## 2022-03-06 NOTE — Telephone Encounter (Signed)
This is not solely for weight management pt has insulin resistance and wants to see endocrine  ? ?What do you recommend ?

## 2022-03-08 ENCOUNTER — Encounter: Payer: Self-pay | Admitting: Dietician

## 2022-03-08 NOTE — Progress Notes (Signed)
Communicated with patient by email, copy of messages is below: ? ?Insulin resistance does not necessarily mean you have pre-diabetes, it might just be that you have a higher level of insulin in your blood, but not higher blood sugar. Pre diabetes is diagnosed when the HbA1C test is 5.7%-6.4%. Insulin resistance can lead to pre-diabetes if it worsens.  ?I'm sure you have had some benefits from the surgery, even if not apparent at the moment. Dr. Kae Heller will likely be able to help more too.  ?I hope you can find the answers you need! ?[name] ? ?From: Debbie Townsend [email or web address]>  ?Sent: Thursday, March 07, 2022 4:35 PM ?To: Debbie Townsend '@[email or web address]> ?Subject: RE: Update ? ?This message was sent securely using Z[employer]?   ? ?*Caution - External email - see footer for warnings* ?Does being insulin resistance mean pre-diabetic?  ? ?I just feel like all this was a waste of time and the surgery was not worth it. I have called and left a message for Dr. Kae Heller and waiting for her to call me back.  ? ?Thanks,  ? ?  ?  ?Debbie Townsend  ?  ?Ecologist  ?  ?  ?[address]  ?  ?[place] [address]  ?  ?  ?  ?  ?   ?Mobile:   ? [phone]  ?  ?Fax:  ? [phone]  ?  ?Email:   ? [email or web address]  ?  ?  ?  ?  ?[email or web address]  ?  ?  ?  ?  ?  ?  ?  ?  ?  ?   ?  ?  ?  ?  ?  ?  ?  ?  ? ?From: Debbie Townsend '@[email or web address]>  ?Sent: Thursday, March 07, 2022 4:14 PM ?To: [name] ?Subject: RE: Update ? ?CAUTION: External Sender ? ?This message was sent securely by [facility].    ?  ? ?Hi [name], ?If the weight loss is still stalled or slow, it is not you who failed; if anything, the surgery has failed you. Hopefully Dr. Kae Heller will be able to offer more help; some people do see better results when taking some medication. There are also medications such as Wegovy that might help with some of the insulin resistance as well.  ?You  seem to be sticking with the bariatric diet very closely, from what you have mentioned eating, I can't see that any other diet changes would help any more than what you are already doing.  ?If you are exercising regularly and are taking your vitamin supplements, some weight loss medication might be the best next step.  ?Thank you for the update; I hope you can find something to work for you! ?[name] ?  ?  ?From: Debbie Townsend '@[email or web address]>  ?Sent: Thursday, March 07, 2022 9:14 AM ?To: Debbie Townsend '@[email or web address]> ?Subject: Update ?  ?This message was sent securely using Zix?   ?  ?*Caution - External email - see footer for warnings* ?Hey there,  ?  ?I just wanted to update you - my blood work came back showing insulin resistant. I am not sure if we have talked about this since my bloodwork came back. My primary care physician suggests this could be a cause of slow/no weight loss. I have left a message with Dr. Kae Heller, who did the surgery, because my primary care dr is suggesting I get referred to a weight loss clinic or maybe start Phentermine, weight loss RX.  I am so confused. I don't understand why this surgery isn't working for me. I feel like a failure.  ?  ?My eating hasn't changed since I seen you last.  ?Last 24-hour food:  ?  ?Egg whites, [place] sausage ?21g protein bar (snack)  ?8 Grilled chicken nuggets (chickfila) - took me maybe 30-40 mins to eat all of them. ?Grapes and cheese stick for snack ?Grilled chicken and veggie for dinner - 3-4 oz chicken ?10g Protein bar before bed snack ?Water/unsweet tea during the day ?  ?IDK what else to do here?  ?  ?  ?  ?Debbie Townsend  ?  ?Ecologist  ?  ?  ?[address], [address]  ?  ?[place], [place] [address]  ?  ?  ?  ?  ?   ?Mobile:   ? [phone]  ?  ?Fax:  ? [phone]  ?  ?Email:   ? [email or web address]  ?  ?  ?  ?  ?[email or web address]  ?  ?  ?  ?  ?  ?  ?  ?  ?  ?   ?  ?  ?  ?  ?  ?  ?  ?  ?  ?This message and any  attachments are solely for the intended recipient and may contain confidential or privileged information. If you are not the intended recipient, any disclosure, copying, use, or distribution of the information included in this message and any attachments is prohibited. If you have received this communication in error, please notify us by reply e-mail and immediately and permanently delete this message and any attachments. [facility] NMLS [phone] ([email or web address]) is an Civil Service fast streamer.  ?WARNING: This email originated outside of [facility]. Even if this looks like a [employer], it is not. ?Do not provide your username, password, or any other personal information in response to this or any other email. ?[facility] will never ask you for your username or password via email. ?DO NOT CLICK links or attachments unless you are positive the content is safe. ?If in doubt about the safety of this message, select the [employer] Report Phishing button, which forwards to IT Security. ? ?

## 2022-03-11 ENCOUNTER — Encounter (INDEPENDENT_AMBULATORY_CARE_PROVIDER_SITE_OTHER): Payer: Self-pay | Admitting: Family Medicine

## 2022-03-11 NOTE — Progress Notes (Unsigned)
Called patient to explain our program. Patient would like a call to schedule her first appointment.  ?

## 2022-03-14 ENCOUNTER — Ambulatory Visit
Admission: RE | Admit: 2022-03-14 | Discharge: 2022-03-14 | Disposition: A | Discharge status: Home / Self Care | Payer: Managed Care, Other (non HMO) | Source: Ambulatory Visit | Attending: Internal Medicine | Admitting: Internal Medicine

## 2022-03-14 DIAGNOSIS — N63 Unspecified lump in unspecified breast: Secondary | ICD-10-CM | POA: Insufficient documentation

## 2022-03-14 DIAGNOSIS — N644 Mastodynia: Secondary | ICD-10-CM | POA: Insufficient documentation

## 2022-03-14 DIAGNOSIS — N641 Fat necrosis of breast: Secondary | ICD-10-CM | POA: Diagnosis present

## 2022-03-15 ENCOUNTER — Emergency Department: Payer: Managed Care, Other (non HMO)

## 2022-03-15 ENCOUNTER — Encounter: Payer: Self-pay | Admitting: Emergency Medicine

## 2022-03-15 ENCOUNTER — Ambulatory Visit
Admission: EM | Admit: 2022-03-15 | Discharge: 2022-03-15 | Disposition: A | Discharge status: Other Acute Inpatient Hospital | Payer: Managed Care, Other (non HMO) | Attending: Emergency Medicine | Admitting: Emergency Medicine

## 2022-03-15 ENCOUNTER — Emergency Department
Admission: EM | Admit: 2022-03-15 | Discharge: 2022-03-15 | Disposition: A | Discharge status: Home / Self Care | Payer: Managed Care, Other (non HMO) | Attending: Emergency Medicine | Admitting: Emergency Medicine

## 2022-03-15 ENCOUNTER — Other Ambulatory Visit: Payer: Self-pay

## 2022-03-15 DIAGNOSIS — I10 Essential (primary) hypertension: Secondary | ICD-10-CM | POA: Diagnosis not present

## 2022-03-15 DIAGNOSIS — H53149 Visual discomfort, unspecified: Secondary | ICD-10-CM | POA: Insufficient documentation

## 2022-03-15 DIAGNOSIS — R519 Headache, unspecified: Secondary | ICD-10-CM

## 2022-03-15 MED ORDER — KETOROLAC TROMETHAMINE 15 MG/ML IJ SOLN
15.0000 mg | Freq: Once | INTRAMUSCULAR | Status: DC
  Filled 2022-03-15: qty 1

## 2022-03-15 MED ORDER — LACTATED RINGERS IV BOLUS: 1000.0000 mL | Freq: Once | INTRAVENOUS | Status: DC

## 2022-03-15 MED ORDER — BUTALBITAL-APAP-CAFFEINE 50-325-40 MG PO TABS
2.0000 | ORAL_TABLET | Freq: Once | ORAL | Status: AC
  Administered 2022-03-15: 2 via ORAL

## 2022-03-15 MED ORDER — KETOROLAC TROMETHAMINE 60 MG/2ML IM SOLN
30.0000 mg | Freq: Once | INTRAMUSCULAR | Status: AC
Start: 2022-03-15 — End: 2022-03-15
  Administered 2022-03-15: 30 mg via INTRAMUSCULAR
  Filled 2022-03-15: qty 2

## 2022-03-15 MED ORDER — METOCLOPRAMIDE HCL 5 MG/ML IJ SOLN
10.0000 mg | Freq: Once | INTRAMUSCULAR | Status: DC
  Filled 2022-03-15: qty 2

## 2022-03-15 NOTE — ED Provider Notes (Signed)
? ?[facility] ?Provider Note ? ? ? Event Date/Time  ? First MD Initiated Contact with Patient 03/15/22 1441   ?  (approximate) ? ? ?History  ? ?Migraine ? ? ?HPI ? ?Debbie Townsend is a 36 y.o. female  with a history of IIH, migraine headaches who presents for evaluation of HA.  Patient was taken out of all of her medications for her IIH 2 years ago after having bariatric surgery and losing 50 pounds.  She reports that since then she continues to have headaches 3-4 times a month.  This current headache has been ongoing for 3 days.  She has had photophobia but denies any visual loss.  She is saw her neuro-ophthalmologist 3 days ago who recommended an outpatient lumbar puncture to determine if she needs to be restarted on Diamox.  Patient is trying to get this appointment scheduled.  Today she went to urgent care for a migraine cocktail and was told to come to the ER because of her history of IIH.  Patient reports that this headache is the same that she has had for the last 2 years.  No dizziness, no head trauma, she is not on any blood thinners, no fever or chills, no neck stiffness. ? ?  ? ? ?Past Medical History:  ?Diagnosis Date  ? Anxiety   ? BRCA negative   ? MyRisk update neg 5/22  ? Depression   ? Family history of anesthesia complication   ?  mother had Post -op nausea and dizziness.  ? Family history of breast cancer   ? Fibula fracture   ? distal end f/u [place] [facility] spring 2022 after fall   ? Foot drop, left foot   ? GERD (gastroesophageal reflux disease)   ? Headache   ? migraines  ? History of kidney stones   ? Hypertension   ? better since gastric sleeve.  no meds.  ? Increased risk of breast cancer 04/2021  ? IBIS=20.3%/riskscore=35.6%  ? Kidney stone   ? PONV (postoperative nausea and vomiting)   ? Pseudotumor cerebri 2007 or 2009  ? ? ?Past Surgical History:  ?Procedure Laterality Date  ? ABDOMINAL HYSTERECTOMY    ? CESAREAN SECTION    ? 2009/2017  ? CHOLECYSTECTOMY N/A 02/04/2019   ? Procedure: LAPAROSCOPIC CHOLECYSTECTOMY;  Surgeon: Fredirick Maudlin, MD;  Location: [facility] ORS;  Service: General;  Laterality: N/A;  ? LAPAROSCOPIC GASTRIC SLEEVE RESECTION N/A 11/14/2020  ? Procedure: LAPAROSCOPIC GASTRIC SLEEVE RESECTION;  Surgeon: [name], MD;  Location: [facility];  Service: General;  Laterality: N/A;  ? LUMBAR LAMINECTOMY/DECOMPRESSION MICRODISCECTOMY Left 09/22/2014  ? Procedure: Left Lumbar four-five microdiskectomy;  Surgeon: Consuella Lose, MD;  Location: [place] [facility];  Service: Neurosurgery;  Laterality: Left;  Left Lumbar four-five microdiskectomy  ? ORIF ANKLE FRACTURE Left 03/23/2021  ? Procedure: OPEN REDUCTION INTERNAL FIXATION (ORIF) Left lateral malleolus with syndesmosis repair;  Surgeon: Leim Fabry, MD;  Location: [facility];  Service: Orthopedics;  Laterality: Left;  ? UPPER GI ENDOSCOPY N/A 11/14/2020  ? Procedure: UPPER GI ENDOSCOPY;  Surgeon: [name], MD;  [facility];  Service: General;  Laterality: N/A;  ? WISDOM TOOTH EXTRACTION    ? ? ? ?Physical Exam  ? ?Triage Vital Signs: ?ED Triage Vitals  ?Enc Vitals Group  ?   BP 03/15/22 1419 (!) 147/90  ?   Pulse Rate 03/15/22 1419 (!) 58  ?   Resp 03/15/22 1419 16  ?   Temp  03/15/22 1419 98.2 ?F (36.8 ?C)  ?   Temp Source 03/15/22 1419 Oral  ?   SpO2 03/15/22 1419 97 %  ?   Weight 03/15/22 1421 228 lb (103.4 kg)  ?   Height 03/15/22 1421 '5\' 6"'  (1.676 m)  ?   Head Circumference --   ?   Peak Flow --   ?   Pain Score 03/15/22 1420 10  ?   Pain Loc --   ?   Pain Edu? --   ?   Excl. in [place]? --   ? ? ?Most recent vital signs: ?Vitals:  ? 03/15/22 1419  ?BP: (!) 147/90  ?Pulse: (!) 58  ?Resp: 16  ?Temp: 98.2 ?F (36.8 ?C)  ?SpO2: 97%  ? ? ? ?Constitutional: Alert and oriented. Well appearing and in no apparent distress. ?HEENT: ?     Head: Normocephalic and atraumatic.    ?     Eyes: Conjunctivae are normal. Sclera is non-icteric.  ?     Mouth/Throat: Mucous membranes are moist.  ?     Neck: Supple with  no signs of meningismus. ?Cardiovascular: Regular rate and rhythm. No murmurs, gallops, or rubs. 2+ symmetrical distal pulses are present in all extremities.  ?Respiratory: Normal respiratory effort. Lungs are clear to auscultation bilaterally.  ?Gastrointestinal: Soft, non tender, and non distended with positive bowel sounds. No rebound or guarding. ?Genitourinary: No CVA tenderness. ?Musculoskeletal:  No edema, cyanosis, or erythema of extremities. ?Neurologic: Normal speech and language. Face is symmetric. Moving all extremities. No gross focal neurologic deficits are appreciated. ?Skin: Skin is warm, dry and intact. No rash noted. ?Psychiatric: Mood and affect are normal. Speech and behavior are normal. ? ?ED Results / Procedures / Treatments  ? ?Labs ?(all labs ordered are listed, but only abnormal results are displayed) ?Labs Reviewed - No data to display ? ? ?EKG ? ?none ? ? ?RADIOLOGY ?I, Rudene Re, attending MD, have personally viewed and interpreted the images obtained during this visit as below: ? ?CT head negative ? ? ?___________________________________________________ ?Interpretation by Radiologist:  ?CT Head Wo Contrast ? ?Result Date: 03/15/2022 ?CLINICAL DATA:  Headache, pseudotumor cerebri, papilledema. EXAM: CT HEAD WITHOUT CONTRAST TECHNIQUE: Contiguous axial images were obtained from the base of the skull through the vertex without intravenous contrast. RADIATION DOSE REDUCTION: This exam was performed according to the departmental dose-optimization program which includes automated exposure control, adjustment of the mA and/or kV according to patient size and/or use of iterative reconstruction technique. COMPARISON:  06/07/2021 FINDINGS: Brain: The brainstem, cerebellum, cerebral peduncles, thalami, basal ganglia, basilar cisterns, and ventricular system appear within normal limits. No intracranial hemorrhage, mass lesion, or acute CVA. Vascular: Unremarkable Skull: Unremarkable  Sinuses/Orbits: Left partial ethmoidectomy and left maxillary antrostomy. No significant current sinusitis. Other: Stable scalp calcifications. IMPRESSION: 1. No significant intracranial abnormalities are identified. Electronically Signed   By: Van Clines M.D.   On: 03/15/2022 15:38   ? ? ? ?PROCEDURES: ? ?Critical Care performed: No ? ?Procedures ? ? ? ?IMPRESSION / MDM / ASSESSMENT AND PLAN / ED COURSE  ?I reviewed the triage vital signs and the nursing notes. ? ?36 y.o. female  with a history of IIH, migraine headaches who presents for evaluation of HA.  Patient describes the headache is the same she has had over the last 2 years.  She does have a history of migraine headaches.  No visual loss.  She is neurologically intact otherwise.  I was able to talk to patient's neuro-ophthalmologist  Ms. Elsie Saas, PA over the phone because patient is having difficulty scheduling the LP as an outpatient.  She called [facility] scheduling department and is working on getting patient the appointment. According to her, there is no indication for emergent LP in the ED. I did offer it to the patient but told her she would have to wait until the end of my shift since I am alone with more than 10 other patients. Patient also was able to speak with her provider via [facility] who told her she did not have to wait in the ED for an emergent LP. Her CT is remarkable.  Patient opted to get LP as outpatient. Encouraged her to return If HA is worse, if she has pulsatile tinnitus or visual loss.Patient reports that she only came to the ED because she wanted a migraine cocktail which was provided to her here.  We did discuss standard return precautions for any visual loss or worsening headaches.  Otherwise close follow-up with her neuro-ophthalmologist. ? ?MEDICATIONS GIVEN IN ED: ?Medications  ?butalbital-acetaminophen-caffeine (FIORICET) 50-325-40 MG per tablet 2 tablet (2 tablets Oral Given 03/15/22 1604)  ?ketorolac (TORADOL)  injection 30 mg (30 mg Intramuscular Given 03/15/22 1605)  ? ?Consults: Patient's neuroophthalmologist ? ? ?EMR reviewed including patient's last visit with her neurologist from October 2023 ? ? ? ?FINAL CLINICAL IM

## 2022-03-15 NOTE — Discharge Instructions (Addendum)
This could be a migraine, but I am concerned that the pseudotumor cerebri is contributing to your headache, especially with the recent visual changes.  Please go to the emergency department immediately. ?

## 2022-03-15 NOTE — ED Provider Notes (Signed)
?HPI ? ?SUBJECTIVE: ? ?Debbie Townsend is a 36 y.o. female who reports waking up with a worsening constant frontal headache that goes around to her neck starting today.  She reports photophobia, phonophobia, nausea and 2 episodes of emesis.  She reports an inability to see bright things such as computer screens over the past 2 days.  This is new.  This is not the first or worst headache of her life, but she states that this is quite painful.  No syncope, seizures, fevers, neck stiffness, slurred speech, arm or leg weakness, facial droop, other visual changes, discoordination.  She took an over-the-counter NSAID migraine medication without improvement in her symptoms.  No alleviating factors.  Symptoms are worse with light and noise, and with lying down.  She has a past medical history of pseudotumor cerebri, and states that her neurologist thinks that her worsening headaches are due to the pseudotumor cerebri, and she is trying to schedule an LP for this.  She also has a history of hypertension, migraines.  She was started on hydrochlorothiazide last week for hypertension.  No other medication changes. ? ? ?Past Medical History:  ?Diagnosis Date  ? Anxiety   ? BRCA negative   ? MyRisk update neg 5/22  ? Depression   ? Family history of anesthesia complication   ?  mother had Post -op nausea and dizziness.  ? Family history of breast cancer   ? Fibula fracture   ? distal end f/u [facility] spring 2022 after fall   ? Foot drop, left foot   ? GERD (gastroesophageal reflux disease)   ? Headache   ? migraines  ? History of kidney stones   ? Hypertension   ? better since gastric sleeve.  no meds.  ? Increased risk of breast cancer 04/2021  ? IBIS=20.3%/riskscore=35.6%  ? Kidney stone   ? PONV (postoperative nausea and vomiting)   ? Pseudotumor cerebri 2007 or 2009  ? ? ?Past Surgical History:  ?Procedure Laterality Date  ? ABDOMINAL HYSTERECTOMY    ? CESAREAN SECTION    ? 2009/2017  ? CHOLECYSTECTOMY N/A 02/04/2019  ?  Procedure: LAPAROSCOPIC CHOLECYSTECTOMY;  Surgeon: Fredirick Maudlin, MD;  Location: [facility] ORS;  Service: General;  Laterality: N/A;  ? LAPAROSCOPIC GASTRIC SLEEVE RESECTION N/A 11/14/2020  ? Procedure: LAPAROSCOPIC GASTRIC SLEEVE RESECTION;  Surgeon: [name], MD;  [facility];  Service: General;  Laterality: N/A;  ? LUMBAR LAMINECTOMY/DECOMPRESSION MICRODISCECTOMY Left 09/22/2014  ? Procedure: Left Lumbar four-five microdiskectomy;  Surgeon: Consuella Lose, MD;  Location: [facility];  Service: Neurosurgery;  Laterality: Left;  Left Lumbar four-five microdiskectomy  ? ORIF ANKLE FRACTURE Left 03/23/2021  ? Procedure: OPEN REDUCTION INTERNAL FIXATION (ORIF) Left lateral malleolus with syndesmosis repair;  Surgeon: Leim Fabry, MD;  Location: [facility];  Service: Orthopedics;  Laterality: Left;  ? UPPER GI ENDOSCOPY N/A 11/14/2020  ? Procedure: UPPER GI ENDOSCOPY;  Surgeon: [name], MD;  [facility];  Service: General;  Laterality: N/A;  ? WISDOM TOOTH EXTRACTION    ? ? ?Family History  ?Problem Relation Age of Onset  ? Cancer Mother   ?     breast dx'ed age 20/49  ? Hypertension Mother   ? Diabetes Mother   ? Hypothyroidism Mother   ? Depression Mother   ? Breast cancer Mother   ?     8s  ? Liver disease Father   ? Heart attack Father   ? Arthritis Father   ?  Alcohol abuse Father   ? Depression Father   ? Drug abuse Father   ? Early death Father   ? Heart disease Father   ? Hypertension Father   ? Hepatitis C Father   ? Depression Sister   ? Hypertension Sister   ? Arthritis Sister   ? Hypertension Sister   ? Cancer Maternal Grandmother   ?     stomach>liver>brain met  ? Cancer Maternal Uncle   ?     lung not a smoker   ? ? ?Social History  ? ?Tobacco Use  ? Smoking status: Former  ?  Packs/day: 0.50  ?  Types: Cigarettes  ?  Quit date: 03/2019  ?  Years since quitting: 2.9  ? Smokeless tobacco: Never  ?Vaping Use  ? Vaping Use: Never used  ?Substance Use Topics  ? Alcohol  use: Not Currently  ? Drug use: No  ? ? ?No current facility-administered medications for this encounter. ? ?Current Outpatient Medications:  ?  ALPRAZolam (XANAX) 0.25 MG tablet, Take 1 tablet (0.25 mg total) by mouth 2 (two) times daily as needed for anxiety., Disp: 30 tablet, Rfl: 2 ?  Biotin w/ Vitamins C & E (HAIR/SKIN/NAILS PO), Take by mouth daily. (Patient not taking: Reported on 01/17/2022), Disp: , Rfl:  ?  CALCIUM CITRATE PO, Take 1 tablet by mouth daily., Disp: , Rfl:  ?  cyanocobalamin (,VITAMIN B-12,) 1000 MCG/ML injection, Inject 1 mL (1,000 mcg total) into the muscle every 14 (fourteen) days., Disp: 14 mL, Rfl: 1 ?  Multiple Vitamins-Minerals (MULTIVITAMIN WITH MINERALS) tablet, Take 1 tablet by mouth daily. Bariatric advantage one a day, Disp: , Rfl:  ?  NEEDLE, DISP, 25 G 25G X 1-1/2" MISC, 1 Device by Does not apply route every 14 (fourteen) days., Disp: 12 each, Rfl: 3 ?  NURTEC 75 MG TBDP, Take 1 tablet by mouth as needed., Disp: , Rfl:  ?  OVER THE COUNTER MEDICATION, Take 1 tablet by mouth at bedtime. Tranquil Sleep, Disp: , Rfl:  ?  pantoprazole (PROTONIX) 40 MG tablet, Take 1 tablet by mouth daily., Disp: , Rfl:  ?  Semaglutide-Weight Management (WEGOVY) 0.25 MG/0.5ML SOAJ, Inject 0.25 mg into the skin once a week. X 44month Disp: 2 mL, Rfl: 0 ?  Semaglutide-Weight Management (WEGOVY) 0.5 MG/0.5ML SOAJ, Inject 0.5 mg into the skin once a week. X month 2, Disp: 3 mL, Rfl: 0 ?  Semaglutide-Weight Management (WEGOVY) 1 MG/0.5ML SOAJ, Inject 1 mg into the skin once a week. X month 3, Disp: 3 mL, Rfl: 0 ?  Semaglutide-Weight Management (WEGOVY) 1.7 MG/0.75ML SOAJ, Inject 1.7 mg into the skin once a week. Xmonth 4, Disp: 6 mL, Rfl: 0 ?  Semaglutide-Weight Management (WEGOVY) 2.4 MG/0.75ML SOAJ, Inject 2.4 mg into the skin once a week. Weekly, Disp: 6 mL, Rfl: 0 ?  sertraline (ZOLOFT) 100 MG tablet, Take 1.5 tablets (150 mg total) by mouth daily., Disp: 140 tablet, Rfl: 3 ? ?Allergies  ?Allergen  Reactions  ? Apple Juice Other (See Comments)  ?  Lips itch and swell, inside of mouth gets hives  ? Carrot [Daucus Carota] Other (See Comments)  ?  Lips itch and swell, inside of mouth gets hives  ? Monistat [Miconazole] Swelling  ?  Severe vaginal itching and swelling  ? Other Itching and Swelling  ?  Almonds-mouth swells and itches  ? Almond Oil Hives  ? Amoxicillin Hives, Itching, Swelling, Dermatitis and Rash  ?  Did it involve swelling  of the face/tongue/throat, SOB, or low BP? No ?Did it involve sudden or severe rash/hives, skin peeling, or any reaction on the inside of your mouth or nose? Yes ?Did you need to seek medical attention at a hospital or doctor's office? Yes ?When did it last happen?    within the past 5 years ?If all above answers are "NO", may proceed with cephalosporin use. ? ?Other reaction(s): Other (See Comments) ?Did it involve swelling of the face/tongue/throat, SOB, or low BP? No ?Did it involve sudden or severe rash/hives, skin peeling, or any reaction on the inside of your mouth or nose? Yes ?Did you need to seek medical attention at a hospital or doctor's office? Yes ?When did it last happen?    within the past 5 years ?If all above answers are "NO", may proceed with cephalosporin use. ? ?Did it involve swelling of the face/tongue/throat, SOB, or low BP? No ?Did it involve sudden or severe rash/hives, skin peeling, or any reaction on the inside of your mouth or nose? Yes ?Did you need to seek medical attention at a hospital or doctor's office? Yes ?When did it last happen?    within the past 5 years ?If all above answers are "NO", may proceed with cephalosporin use. ?Did it involve swelling of the face/tongue/throat, SOB, or low BP? No ?Did it involve sudden or severe rash/hives, skin peeling, or any reaction on the inside of your mouth or nose? Yes ?Did you need to seek medical attention at a hospital or doctor's office? Yes ?When did it last happen?    within the past 5 years ?If all  above answers are "NO", may proceed with cephalosporin use.  ? Imitrex [Sumatriptan]   ?  Felt like heart attack ?  ? Topamax [Topiramate]   ?  Memory loss  ?  ? ? ? ?ROS ? ?As noted in HPI.  ? ?Physical Exam

## 2022-03-15 NOTE — ED Triage Notes (Signed)
Pt here with migraine starting this morning with nausea and photophobia. Pt has hx of migraines and takes medication for them, but it is not working today.  ?

## 2022-03-15 NOTE — ED Triage Notes (Signed)
Pt to ED for migraine. Hx of same. ? ?Pt went to UC for "migraine shot" and was told should come to ED for lumbar puncture ?Because pt has pseudo tumor cerebri, or IIH (idiopathic intracranial hypertension) and that because of this, pt should not keep having "migraine shots" before lumbar puncture is done, because pt needs to be tested for "pressure on the brain" which could be causing migraines rather than just keep managing symptoms. ? ?Currently having 10/10 HA with sensitivity to light and sound. ? ?Pt has neurologist at [facility], is waiting to hear back from [facility] about scheduling lumbar puncture. ?

## 2022-03-15 NOTE — Discharge Instructions (Signed)
To the ER for worsening headache, visual loss, nausea or vomiting.  Make sure to follow-up with your neurologist for an outpatient LP. ?

## 2022-03-18 ENCOUNTER — Encounter: Payer: Self-pay | Admitting: Internal Medicine

## 2022-03-18 NOTE — Telephone Encounter (Signed)
Okay to send in [place] while Patient waits to be seen by weight management?  ?

## 2022-03-19 MED ORDER — OZEMPIC (0.25 OR 0.5 MG/DOSE) 2 MG/1.5ML ~~LOC~~ SOPN: 0.2500 mg | PEN_INJECTOR | SUBCUTANEOUS | 2 refills | Status: DC

## 2022-03-28 ENCOUNTER — Ambulatory Visit (INDEPENDENT_AMBULATORY_CARE_PROVIDER_SITE_OTHER): Payer: BC Managed Care – PPO | Admitting: Bariatrics

## 2022-04-02 ENCOUNTER — Other Ambulatory Visit: Payer: Self-pay | Admitting: Internal Medicine

## 2022-04-02 ENCOUNTER — Ambulatory Visit (INDEPENDENT_AMBULATORY_CARE_PROVIDER_SITE_OTHER): Payer: BC Managed Care – PPO | Admitting: Bariatrics

## 2022-04-02 DIAGNOSIS — Z87898 Personal history of other specified conditions: Secondary | ICD-10-CM

## 2022-04-02 DIAGNOSIS — E669 Obesity, unspecified: Secondary | ICD-10-CM

## 2022-04-02 DIAGNOSIS — R635 Abnormal weight gain: Secondary | ICD-10-CM

## 2022-04-02 DIAGNOSIS — Z9884 Bariatric surgery status: Secondary | ICD-10-CM

## 2022-04-02 DIAGNOSIS — E8881 Metabolic syndrome: Secondary | ICD-10-CM

## 2022-04-02 MED ORDER — WEGOVY 0.25 MG/0.5ML ~~LOC~~ SOAJ: 0.2500 mg | SUBCUTANEOUS | 0 refills | Status: DC

## 2022-04-02 MED ORDER — WEGOVY 0.5 MG/0.5ML ~~LOC~~ SOAJ: 0.5000 mg | SUBCUTANEOUS | 0 refills | Status: DC

## 2022-04-02 MED ORDER — WEGOVY 1.7 MG/0.75ML ~~LOC~~ SOAJ: 1.7000 mg | SUBCUTANEOUS | 0 refills | Status: DC

## 2022-04-02 MED ORDER — WEGOVY 2.4 MG/0.75ML ~~LOC~~ SOAJ: 2.4000 mg | SUBCUTANEOUS | 0 refills | Status: DC

## 2022-04-02 MED ORDER — WEGOVY 1 MG/0.5ML ~~LOC~~ SOAJ: 1.0000 mg | SUBCUTANEOUS | 0 refills | Status: DC

## 2022-04-03 ENCOUNTER — Telehealth: Payer: Self-pay | Admitting: *Deleted

## 2022-04-03 NOTE — Telephone Encounter (Signed)
Check PA wegovy. ?

## 2022-04-04 ENCOUNTER — Telehealth: Payer: Self-pay | Admitting: Internal Medicine

## 2022-04-04 NOTE — Telephone Encounter (Signed)
Multiple messages re wt loss medication wegovy ?The nurse Kerin Salen here is working on the PA for wegovy if she cant get approved then there is nothing else we can do  ?There is nothing else I the PCP can do sorry for the cost of the wt loss clinic  ?

## 2022-04-05 NOTE — Telephone Encounter (Signed)
Drug is covered by current benefit plan. No further PA activity needed, Julie-Ann Vanmaanen (Key: [id number]) ?Wegovy 0.'25MG'$ /0.5ML auto-injectors ?  ?Form ?Librarian, academic PA Form [id number] NCP[place]) ?

## 2022-04-19 DIAGNOSIS — Z6836 Body mass index (BMI) 36.0-36.9, adult: Secondary | ICD-10-CM | POA: Insufficient documentation

## 2022-04-19 DIAGNOSIS — E8881 Metabolic syndrome: Secondary | ICD-10-CM | POA: Insufficient documentation

## 2022-04-19 DIAGNOSIS — Z9884 Bariatric surgery status: Secondary | ICD-10-CM

## 2022-04-19 DIAGNOSIS — Z87898 Personal history of other specified conditions: Secondary | ICD-10-CM | POA: Insufficient documentation

## 2022-04-19 HISTORY — DX: Bariatric surgery status: Z98.84

## 2022-04-19 HISTORY — DX: Body mass index (BMI) 36.0-36.9, adult: Z68.36

## 2022-04-26 ENCOUNTER — Telehealth: Payer: Self-pay

## 2022-04-26 NOTE — Telephone Encounter (Signed)
T[place]ania called from CoverMyMeds to state that she heard the request for Debbie Townsend was denied.  T[place] states they faxed an appeal to [place] on 04/18/2022 and she would like to verify that we received the faxed appeal and offer assistance if needed. ? ?[place] states reference key is [id number]. ?

## 2022-04-26 NOTE — Telephone Encounter (Signed)
S/w covermymeds and disc about situation and confirmed that we received fax and returned fax for appeal on pt's medication. Reference key is in case we wanted to start an appeal electornically. We are currently working with insurance company to try to get medication approved for pt but currently they had been denying. Myself and Juliann Pulse have been working on [employer]. Thxs ?

## 2022-05-09 ENCOUNTER — Other Ambulatory Visit: Payer: Self-pay | Admitting: Unknown Physician Specialty

## 2022-05-09 DIAGNOSIS — R430 Anosmia: Secondary | ICD-10-CM

## 2022-05-14 ENCOUNTER — Other Ambulatory Visit: Payer: Self-pay | Admitting: Internal Medicine

## 2022-05-14 DIAGNOSIS — Z87898 Personal history of other specified conditions: Secondary | ICD-10-CM

## 2022-05-14 DIAGNOSIS — Z9884 Bariatric surgery status: Secondary | ICD-10-CM

## 2022-05-14 DIAGNOSIS — R635 Abnormal weight gain: Secondary | ICD-10-CM

## 2022-05-14 DIAGNOSIS — Z6836 Body mass index (BMI) 36.0-36.9, adult: Secondary | ICD-10-CM

## 2022-05-14 DIAGNOSIS — Z903 Acquired absence of stomach [part of]: Secondary | ICD-10-CM

## 2022-05-14 DIAGNOSIS — E8881 Metabolic syndrome: Secondary | ICD-10-CM

## 2022-05-14 DIAGNOSIS — E669 Obesity, unspecified: Secondary | ICD-10-CM

## 2022-05-14 MED ORDER — SAXENDA 18 MG/3ML ~~LOC~~ SOPN: PEN_INJECTOR | SUBCUTANEOUS | 2 refills | Status: DC

## 2022-05-14 NOTE — Telephone Encounter (Signed)
See my chart message I have sent my chart advising patient insurance is saying she must try and fail at least one of these prior to [facility]?

## 2022-05-14 NOTE — Addendum Note (Signed)
Addended by: Orland Mustard on: 05/14/2022 01:21 PM   Modules accepted: Orders

## 2022-05-15 NOTE — Addendum Note (Signed)
Addended by: Orland Mustard on: 05/15/2022 12:07 PM   Modules accepted: Orders

## 2022-05-25 ENCOUNTER — Telehealth: Payer: Managed Care, Other (non HMO) | Admitting: Nurse Practitioner

## 2022-05-25 DIAGNOSIS — J029 Acute pharyngitis, unspecified: Secondary | ICD-10-CM | POA: Diagnosis not present

## 2022-05-25 DIAGNOSIS — Z20818 Contact with and (suspected) exposure to other bacterial communicable diseases: Secondary | ICD-10-CM | POA: Diagnosis not present

## 2022-05-25 MED ORDER — CEFDINIR 300 MG PO CAPS: 300.0000 mg | ORAL_CAPSULE | Freq: Two times a day (BID) | ORAL | 0 refills | Status: DC

## 2022-05-25 NOTE — Patient Instructions (Signed)
Force fluids °Motrin or tylenol OTC °OTC decongestant °Throat lozenges if help °New toothbrush in 3 days ° °

## 2022-05-25 NOTE — Progress Notes (Signed)
Virtual Visit Consent   Debbie Townsend, you are scheduled for a virtual visit with Debbie Townsend, [place], a [facility] provider, today.     Just as with appointments in the office, your consent must be obtained to participate.  Your consent will be active for this visit and any virtual visit you may have with one of our providers in the next 365 days.     If you have a MyChart account, a copy of this consent can be sent to you electronically.  All virtual visits are billed to your insurance company just like a traditional visit in the office.    As this is a virtual visit, video technology does not allow for your provider to perform a traditional examination.  This may limit your provider's ability to fully assess your condition.  If your provider identifies any concerns that need to be evaluated in person or the need to arrange testing (such as labs, EKG, etc.), we will make arrangements to do so.     Although advances in technology are sophisticated, we cannot ensure that it will always work on either your end or our end.  If the connection with a video visit is poor, the visit may have to be switched to a telephone visit.  With either a video or telephone visit, we are not always able to ensure that we have a secure connection.     I need to obtain your verbal consent now.   Are you willing to proceed with your visit today? YES   Debbie Townsend has provided verbal consent on 05/25/2022 for a virtual visit (video or telephone).   Debbie Hassell Done, FNP   Date: 05/25/2022 8:09 AM   Virtual Visit via Video Note   I, Debbie Townsend, connected with Debbie Townsend ([id number], Oct 01, 1986) on 05/25/22 at  8:15 AM EDT by a video-enabled telemedicine application and verified that I am speaking with the correct person using two identifiers.  Location: Patient: Virtual Visit Location Patient: Mobile Provider: Virtual Visit Location Provider: Mobile   I discussed the  limitations of evaluation and management by telemedicine and the availability of in person appointments. The patient expressed understanding and agreed to proceed.    History of Present Illness: Debbie Townsend is a 36 y.o. who identifies as a female who was assigned female at birth, and is being seen today for strep throat.  HPI: Patient c/o sore throat. Her daught rwas dx with strep throat 2 days ago.  Sore Throat  This is a new problem. The current episode started yesterday. The problem has been gradually worsening. Neither side of throat is experiencing more pain than the other. The maximum temperature recorded prior to her arrival was 100.4 - 100.9 F. The fever has been present for Less than 1 day. The pain is at a severity of 7/10. The pain is moderate. Associated symptoms include congestion, swollen glands and trouble swallowing. Pertinent negatives include no coughing or ear pain. She has had no exposure to strep. She has tried acetaminophen for the symptoms. The treatment provided no relief.    Review of Systems  HENT:  Positive for congestion and trouble swallowing. Negative for ear pain.   Respiratory:  Negative for cough.     Problems:  Patient Active Problem List   Diagnosis Date Noted   BMI 36.0-36.9,adult 04/19/2022   History of prediabetes 04/19/2022   S/P bariatric surgery 04/19/2022   Insulin resistance 04/19/2022   Fat necrosis of left  breast 11/29/2021   Burn- left small finger  11/05/2021   Skin infection 11/05/2021   Antibiotic-induced yeast infection 11/05/2021   Rapid palpitations 11/01/2021   Chest pain of uncertain etiology 25/95/6387   Obesity (BMI 30-39.9) 05/07/2021   H/O gastric sleeve 12/14/2020   Iron deficiency 11/03/2020   Hair loss 07/03/2020   Prediabetes 07/03/2020   Depression, recurrent ([place]) 07/03/2020   Intractable migraine without aura and without status migrainosus 04/04/2020   Gastroesophageal reflux disease without esophagitis  03/29/2020   Chronic midline low back pain 02/13/2020   CSF leak from nose 12/07/2019   Other optic atrophy, bilateral 12/07/2019   IIH (idiopathic intracranial hypertension) 12/07/2019   Annual physical exam 09/24/2019   Insomnia 09/24/2019   Fatigue 09/24/2019   History of kidney stones 09/24/2019   Allergic rhinitis 03/24/2019   Status post laparoscopic hysterectomy 02/12/2019   Adenomyosis 02/04/2019   DUB (dysfunctional uterine bleeding) 09/10/2018   Fatty liver 04/02/2018   Gallstones 04/02/2018   Heavy menses 04/02/2018   FH: breast cancer in first degree relative 04/02/2018   Anxiety 04/02/2018   Anxiety and depression 12/16/2016   Cesarean delivery delivered 03/22/2016   Rubella non-immune status, antepartum 01/08/2016   Essential hypertension 11/13/2015   Pseudotumor cerebri 11/13/2015   HNP (herniated nucleus pulposus), lumbar 09/22/2014    Allergies:  Allergies  Allergen Reactions   Apple Juice Other (See Comments)    Lips itch and swell, inside of mouth gets hives   Carrot [Daucus Carota] Other (See Comments)    Lips itch and swell, inside of mouth gets hives   Monistat [Miconazole] Swelling    Severe vaginal itching and swelling   Other Itching and Swelling    Almonds-mouth swells and itches   Almond Oil Hives   Amoxicillin Hives, Itching, Swelling, Dermatitis and Rash    Did it involve swelling of the face/tongue/throat, SOB, or low BP? No Did it involve sudden or severe rash/hives, skin peeling, or any reaction on the inside of your mouth or nose? Yes Did you need to seek medical attention at a hospital or doctor's office? Yes When did it last happen?    within the past 5 years If all above answers are "NO", may proceed with cephalosporin use.  Other reaction(s): Other (See Comments) Did it involve swelling of the face/tongue/throat, SOB, or low BP? No Did it involve sudden or severe rash/hives, skin peeling, or any reaction on the inside of your mouth or  nose? Yes Did you need to seek medical attention at a hospital or doctor's office? Yes When did it last happen?    within the past 5 years If all above answers are "NO", may proceed with cephalosporin use.  Did it involve swelling of the face/tongue/throat, SOB, or low BP? No Did it involve sudden or severe rash/hives, skin peeling, or any reaction on the inside of your mouth or nose? Yes Did you need to seek medical attention at a hospital or doctor's office? Yes When did it last happen?    within the past 5 years If all above answers are "NO", may proceed with cephalosporin use. Did it involve swelling of the face/tongue/throat, SOB, or low BP? No Did it involve sudden or severe rash/hives, skin peeling, or any reaction on the inside of your mouth or nose? Yes Did you need to seek medical attention at a hospital or doctor's office? Yes When did it last happen?    within the past 5 years If all above answers  are "NO", may proceed with cephalosporin use.   Imitrex [Sumatriptan]     Felt like heart attack    Topamax [Topiramate]     Memory loss     Medications:  Current Outpatient Medications:    ALPRAZolam (XANAX) 0.25 MG tablet, Take 1 tablet (0.25 mg total) by mouth 2 (two) times daily as needed for anxiety., Disp: 30 tablet, Rfl: 2   Biotin w/ Vitamins C & E (HAIR/SKIN/NAILS PO), Take by mouth daily. (Patient not taking: Reported on 01/17/2022), Disp: , Rfl:    CALCIUM CITRATE PO, Take 1 tablet by mouth daily., Disp: , Rfl:    cyanocobalamin (,VITAMIN B-12,) 1000 MCG/ML injection, Inject 1 mL (1,000 mcg total) into the muscle every 14 (fourteen) days., Disp: 14 mL, Rfl: 1   Multiple Vitamins-Minerals (MULTIVITAMIN WITH MINERALS) tablet, Take 1 tablet by mouth daily. Bariatric advantage one a day, Disp: , Rfl:    NEEDLE, DISP, 25 G 25G X 1-1/2" MISC, 1 Device by Does not apply route every 14 (fourteen) days., Disp: 12 each, Rfl: 3   NURTEC 75 MG TBDP, Take 1 tablet by mouth as needed.,  Disp: , Rfl:    OVER THE COUNTER MEDICATION, Take 1 tablet by mouth at bedtime. Tranquil Sleep, Disp: , Rfl:    pantoprazole (PROTONIX) 40 MG tablet, Take 1 tablet by mouth daily., Disp: , Rfl:    Semaglutide,0.25 or 0.'5MG'$ /DOS, (OZEMPIC, 0.25 OR 0.5 MG/DOSE,) 2 MG/1.5ML SOPN, Inject 0.25 mg into the skin once a week. Can increase to 0.5 weekly after 1 month obesity/insulin resistance, Disp: 1.5 mL, Rfl: 2   Semaglutide-Weight Management (WEGOVY) 0.25 MG/0.5ML SOAJ, Inject 0.25 mg into the skin once a week. X 24month Disp: 2 mL, Rfl: 0   Semaglutide-Weight Management (WEGOVY) 0.5 MG/0.5ML SOAJ, Inject 0.5 mg into the skin once a week. X month 2, Disp: 3 mL, Rfl: 0   Semaglutide-Weight Management (WEGOVY) 1 MG/0.5ML SOAJ, Inject 1 mg into the skin once a week. X month 3, Disp: 3 mL, Rfl: 0   Semaglutide-Weight Management (WEGOVY) 1.7 MG/0.75ML SOAJ, Inject 1.7 mg into the skin once a week. Xmonth 4, Disp: 6 mL, Rfl: 0   Semaglutide-Weight Management (WEGOVY) 2.4 MG/0.75ML SOAJ, Inject 2.4 mg into the skin once a week. Weekly, Disp: 6 mL, Rfl: 0   sertraline (ZOLOFT) 100 MG tablet, Take 1.5 tablets (150 mg total) by mouth daily., Disp: 140 tablet, Rfl: 3  Observations/Objective: Patient is well-developed, well-nourished in no acute distress.  Resting comfortably  at home.  Head is normocephalic, atraumatic.  No labored breathing.  Speech is clear and coherent with logical content.  Patient is alert and oriented at baseline.  Trouble swallowing  Assessment and Plan:  JTONDALAYA PERRENin today with chief complaint of Sore Throat   1. Pharyngitis, unspecified etiology Force fluids Motrin or tylenol OTC OTC decongestant Throat lozenges if help New toothbrush in 3 days  Meds ordered this encounter  Medications   cefdinir (OMNICEF) 300 MG capsule    Sig: Take 1 capsule (300 mg total) by mouth 2 (two) times daily. 1 po BID    Dispense:  20 capsule    Refill:  0    Order Specific Question:    Supervising Provider    Answer:   MILLER, BRIAN [[id number]]     2. Streptococcus exposure     Follow Up Instructions: I discussed the assessment and treatment plan with the patient. The patient was provided an opportunity to ask questions and all  were answered. The patient agreed with the plan and demonstrated an understanding of the instructions.  A copy of instructions were sent to the patient via MyChart.  The patient was advised to call back or seek an in-person evaluation if the symptoms worsen or if the condition fails to improve as anticipated.  Time:  I spent 13 minutes with the patient via telehealth technology discussing the above problems/concerns.    Debbie Hassell Done, FNP

## 2022-05-29 ENCOUNTER — Other Ambulatory Visit: Payer: Self-pay | Admitting: Obstetrics

## 2022-05-29 DIAGNOSIS — N644 Mastodynia: Secondary | ICD-10-CM

## 2022-06-20 ENCOUNTER — Other Ambulatory Visit: Payer: Self-pay | Admitting: Obstetrics

## 2022-06-20 DIAGNOSIS — Z9189 Other specified personal risk factors, not elsewhere classified: Secondary | ICD-10-CM

## 2022-06-28 DIAGNOSIS — G43909 Migraine, unspecified, not intractable, without status migrainosus: Secondary | ICD-10-CM | POA: Insufficient documentation

## 2022-07-01 ENCOUNTER — Other Ambulatory Visit: Payer: Self-pay | Admitting: Obstetrics & Gynecology

## 2022-07-01 DIAGNOSIS — Z803 Family history of malignant neoplasm of breast: Secondary | ICD-10-CM

## 2022-07-13 ENCOUNTER — Other Ambulatory Visit: Payer: Managed Care, Other (non HMO)

## 2022-07-15 ENCOUNTER — Other Ambulatory Visit: Payer: Managed Care, Other (non HMO)

## 2022-07-16 ENCOUNTER — Encounter: Payer: Self-pay | Admitting: Internal Medicine

## 2022-07-19 ENCOUNTER — Encounter: Payer: Self-pay | Admitting: Internal Medicine

## 2022-07-19 ENCOUNTER — Other Ambulatory Visit: Payer: Self-pay | Admitting: Internal Medicine

## 2022-07-19 DIAGNOSIS — F419 Anxiety disorder, unspecified: Secondary | ICD-10-CM

## 2022-07-19 MED ORDER — SERTRALINE HCL 100 MG PO TABS: 200.0000 mg | ORAL_TABLET | Freq: Every day | ORAL | 3 refills | Status: DC

## 2022-07-23 ENCOUNTER — Other Ambulatory Visit: Payer: Self-pay | Admitting: Family

## 2022-07-23 DIAGNOSIS — F419 Anxiety disorder, unspecified: Secondary | ICD-10-CM

## 2022-07-23 MED ORDER — SERTRALINE HCL 100 MG PO TABS: 200.0000 mg | ORAL_TABLET | Freq: Every day | ORAL | 3 refills | Status: DC

## 2022-07-24 ENCOUNTER — Encounter (INDEPENDENT_AMBULATORY_CARE_PROVIDER_SITE_OTHER): Payer: Self-pay

## 2022-08-06 ENCOUNTER — Encounter: Payer: Self-pay | Admitting: Family Medicine

## 2022-08-06 ENCOUNTER — Ambulatory Visit: Payer: Managed Care, Other (non HMO) | Admitting: Family Medicine

## 2022-08-06 VITALS — BP 138/88 | HR 68 | Temp 97.9°F | Ht 66.0 in | Wt 232.6 lb

## 2022-08-06 DIAGNOSIS — I10 Essential (primary) hypertension: Secondary | ICD-10-CM

## 2022-08-06 DIAGNOSIS — E669 Obesity, unspecified: Secondary | ICD-10-CM | POA: Diagnosis not present

## 2022-08-06 DIAGNOSIS — R7303 Prediabetes: Secondary | ICD-10-CM

## 2022-08-06 DIAGNOSIS — F32A Depression, unspecified: Secondary | ICD-10-CM

## 2022-08-06 DIAGNOSIS — F419 Anxiety disorder, unspecified: Secondary | ICD-10-CM

## 2022-08-06 DIAGNOSIS — E8881 Metabolic syndrome: Secondary | ICD-10-CM

## 2022-08-06 DIAGNOSIS — E538 Deficiency of other specified B group vitamins: Secondary | ICD-10-CM

## 2022-08-06 MED ORDER — METFORMIN HCL ER 500 MG PO TB24: 500.0000 mg | ORAL_TABLET | Freq: Every day | ORAL | 0 refills | Status: DC

## 2022-08-06 MED ORDER — HYDROXYZINE HCL 10 MG PO TABS: 5.0000 mg | ORAL_TABLET | Freq: Three times a day (TID) | ORAL | 0 refills | Status: DC | PRN

## 2022-08-06 NOTE — Patient Instructions (Signed)
It was a pleasure meeting you today. Thank you for allowing me to take part in your health care.  Our goals for today as we discussed include:  For your insulin resistance Take metformin 500 mg daily. Follow-up in 3 months  For your anxiety Continue Zoloft 200 mg daily Start Atarax 5 mg at night, can increase to 10 mg at night if needed.  Can increase to milligrams twice daily if no improvement in symptoms can then increase to 10 mg maximum 3 times day Follow-up in 1 to 2 weeks  Recommend [email or web address].  Can search for therapist that suits your needs.  We can talk about further evaluation at your next visit.  Consider psychology referral for ADHD you evaluation and psychiatry referral for medication management.  For urgent mental health issues can go to [facility] emergency or [facility] [phone] Address: [place], [place] [address] Hours: Open 24/7, No appointment required.    Please follow-up with PCP in 1 to 2 weeks  If you have any questions or concerns, please do not hesitate to call the office at [phone].  I look forward to our next visit and until then take care and stay safe.  Regards,   Carollee Leitz, MD   [facility]

## 2022-08-06 NOTE — Progress Notes (Signed)
    SUBJECTIVE:   CHIEF COMPLAINT / HPI: Transfer care, anxiety  Patient presents to clinic to transfer care to new PCP  Anxiety Patient reports self increasing Zoloft to 200 mg daily 3 weeks ago. She reports that the Xanax is not lasting her as long as it had previously.  Has not been evaluated by psychiatry and has not seen therapist secondary to financial constraints.  Has has suicidal thoughts in the past, no plan and no plan to act on thoughts.  Has supportive husband and kids that wants to live for.  Denies any SI/HI today. She reports that she is currently waiting to hear back from a probono therapist in hopes to start counseling.   Weight management Reports frustration with having bariatric surgery, lost some weight but not able to loose the rest of her weight.  Had been on Wegovy but then was declined by insurance.  PERTINENT  PMH / PSH:  Anxiety/Depression  OBJECTIVE:   BP 138/88 (BP Location: Left Arm, Patient Position: Sitting, Cuff Size: Normal)   Pulse 68   Temp 97.9 F (36.6 C) (Oral)   Ht '5\' 6"'$  (1.676 m)   Wt 232 lb 9.6 oz (105.5 kg)   LMP 02/15/2019   SpO2 98%   BMI 37.54 kg/m    General: Alert, no acute distress Cardio: Normal S1 and S2, RRR, no r/m/g Pulm: CTAB, normal work of breathing Psych: Anxious, does not make good eye contact, tearful at times, constant leg movement, answers questions appropriately   ASSESSMENT/PLAN:   Insulin resistance -Start metformin XR 500 mg daily -Follow-up in 4 weeks.  Anxiety Self increase Zoloft 2 to 3 weeks ago.  Has not found any relief.  Increase use of Xanax. -Discussed with patient that likely will taper down off Xanax  -Start Atarax 5 mg at night.  Can increase to 3 times daily and titrate up to 10 mg 3 times daily if needed.  -Recommend CBT -I think she would benefit from a psychiatry evaluation, discussed the benefits of having mental health provider involved in her care, offered referral today, patient  declined today. -Mental health resources provided  -Follow up in 1-2 weeks -Strict return precautions provided  Essential hypertension Borderline.  Not on any antihypertensives Discussed weight loss and stress management in hopes to reduce BP Recheck at next visit   Vitamin B 12 deficiency Chronic, s/p Bariatric surgery -Continue Vitamin B 12 1000 mcg IM q2w  -Recheck levels at next visit    PDMP reviewed  Carollee Leitz, MD

## 2022-08-07 ENCOUNTER — Encounter: Payer: Self-pay | Admitting: Internal Medicine

## 2022-08-08 IMAGING — RF DG UGI W/ HIGH DENSITY W/O KUB
11 of 13 series · 14 of 24 positions shown · non-contrast
Comparison: NONE.

CLINICAL DATA: Patient with history of sleeve gastrectomy 1 year
ago with complaints of vomiting after eating solids x2 months and
diarrhea.

EXAM:
UPPER GI SERIES WITH HIGH DENSITY WITHOUT KUB
TECHNIQUE: Combined double and single contrast examination was performed using
effervescent crystals, high-density barium and thin liquid barium.
This exam was performed by [place] Braslav Lolic, and was supervised
and interpreted by Dr. Toby.
FLUOROSCOPY TIME:  Radiation Exposure Index (as provided by the
fluoroscopic device): 62.90 mGy
If the device does not provide the exposure index:
Fluoroscopy Time: 3.3 minutes
Number of Acquired Images:  8

[Series 1: cp_standard · 0.28mm/px · 1 of 101 frames shown (1 of 9)]
[frame 16/101]
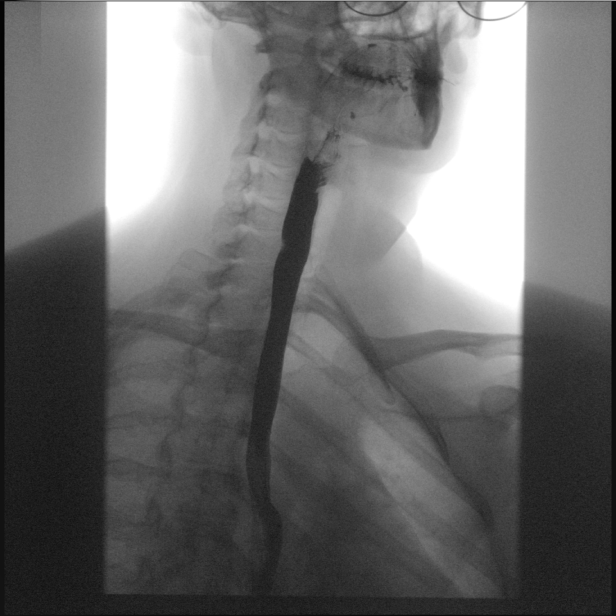

[Series 2: cp_standard · 0.28mm/px · 2 of 231 frames shown (2 of 9)]
[frame 35/231]
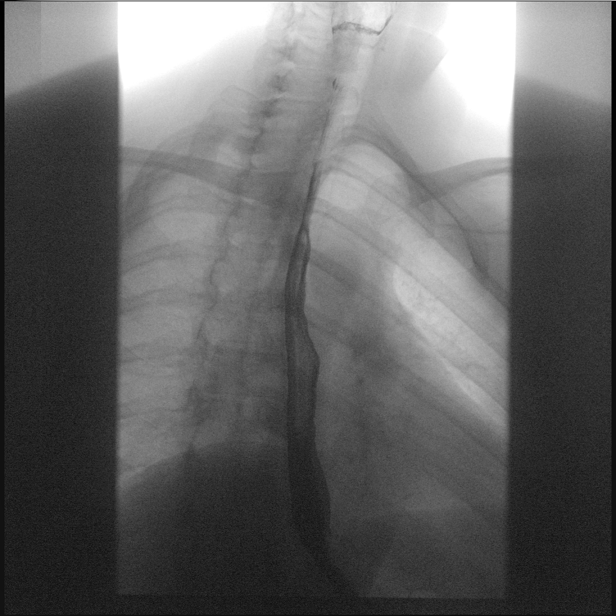
[frame 197/231]
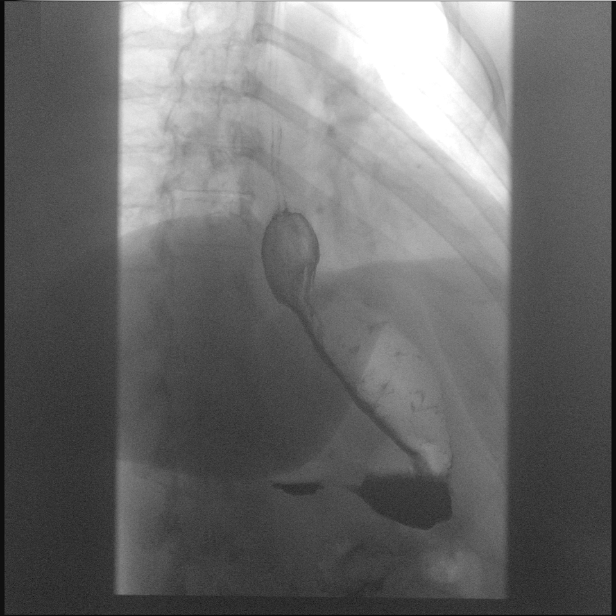

[Series 6: fluoro_barium singleshot_bw · 0.17mm/px · 1 of 1 slices shown (1 of 2)]
[im 1/1]
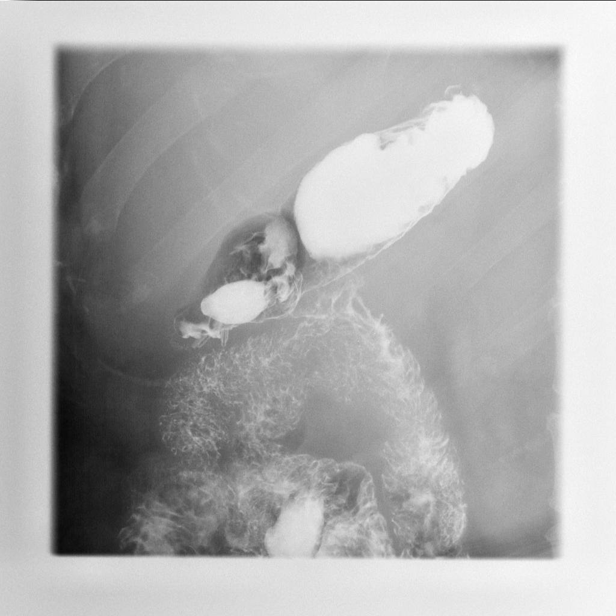

[Series 8: fluoro_barium singleshot_bw · 0.17mm/px · 1 of 1 slices shown (2 of 2)]
[im 1/1]
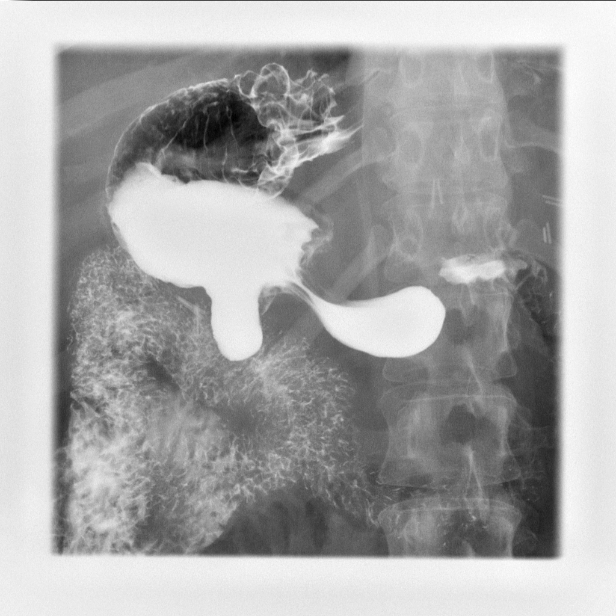

[Series 9: cp_standard · 0.26mm/px · 1 of 163 frames shown (3 of 9)]
[frame 139/163]
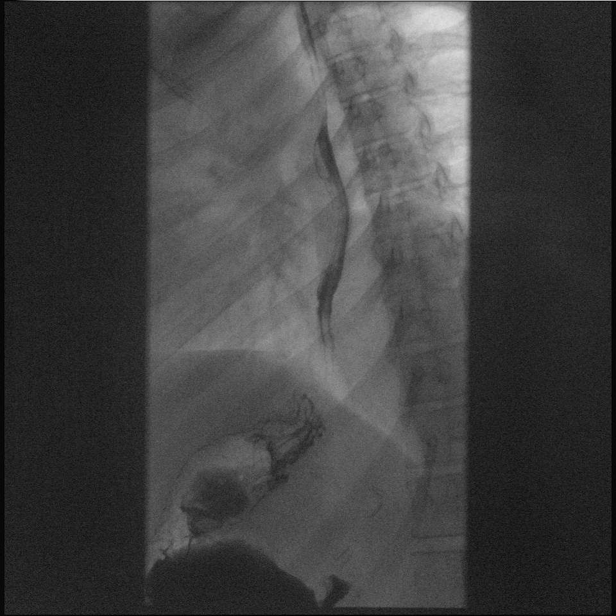

[Series 10: cp_standard · 0.26mm/px · 1 of 209 frames shown (4 of 9)]
[frame 178/209]
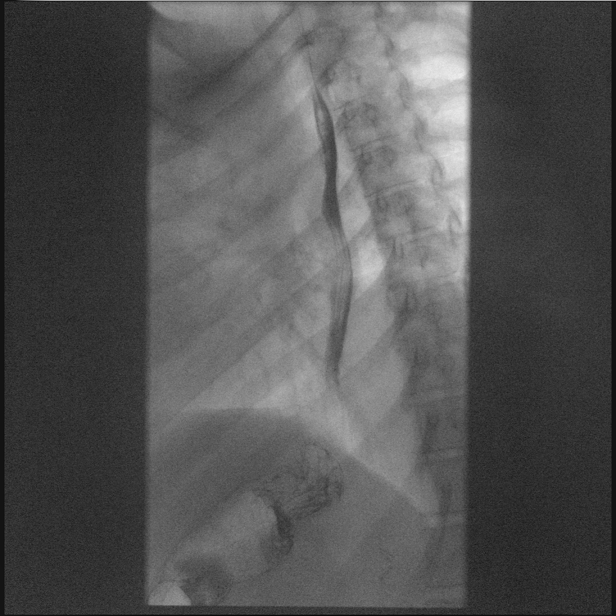

[Series 11: cp_standard · 0.26mm/px · 1 of 1 slices shown (5 of 9)]
[im 1/1]
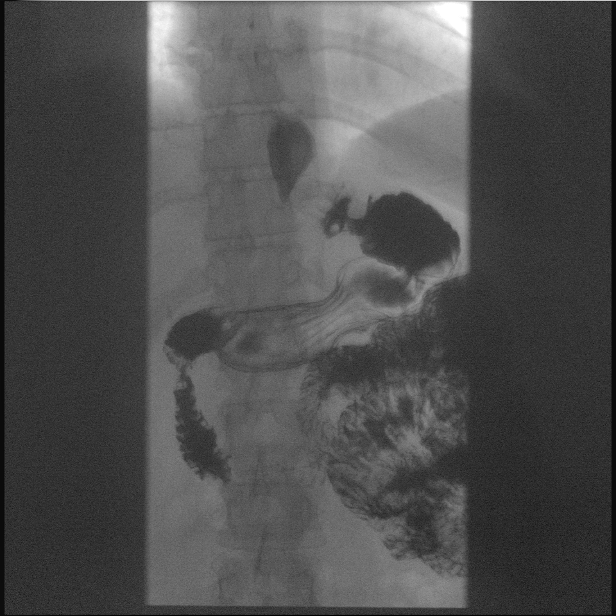

[Series 15: cp_standard · 0.26mm/px · 2 of 49 frames shown (6 of 9)]
[frame 5/49]
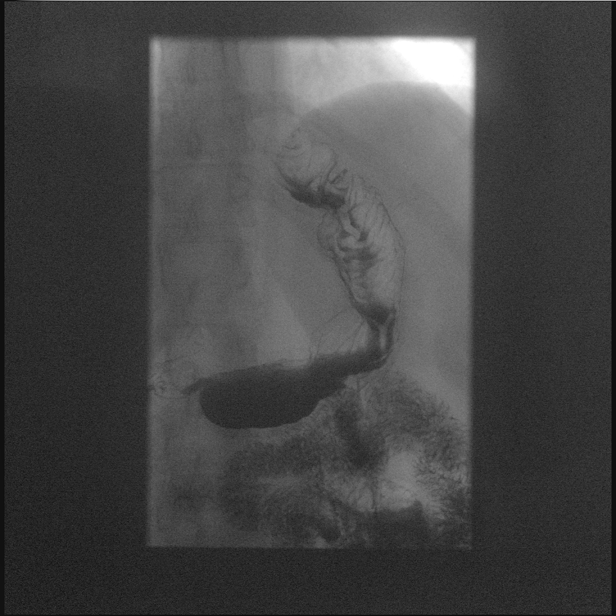
[frame 42/49]
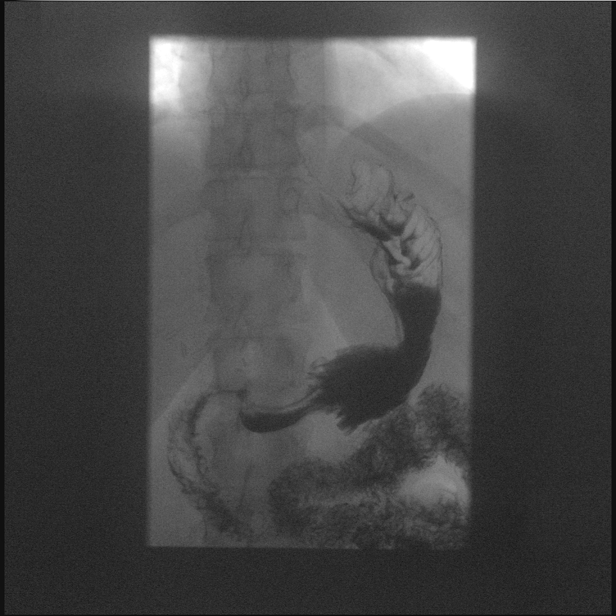

[Series 17: cp_standard · 0.25mm/px · 1 of 128 frames shown (7 of 9)]
[frame 109/128]
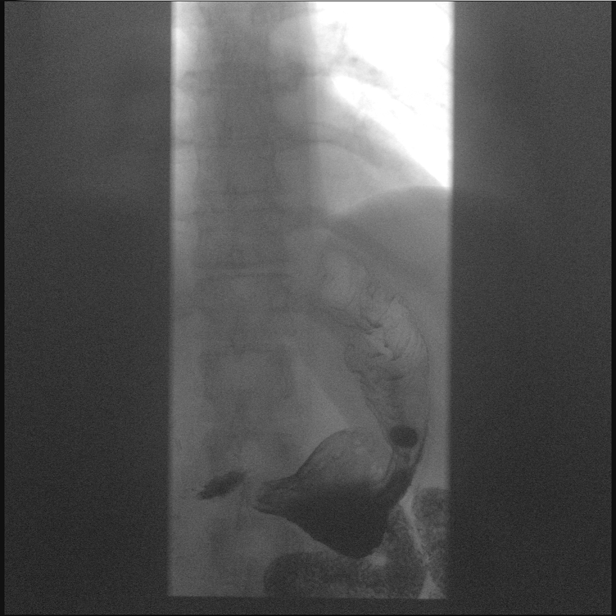

[Series 18: cp_standard · 0.18mm/px · 2 of 45 frames shown (8 of 9)]
[frame 7/45]
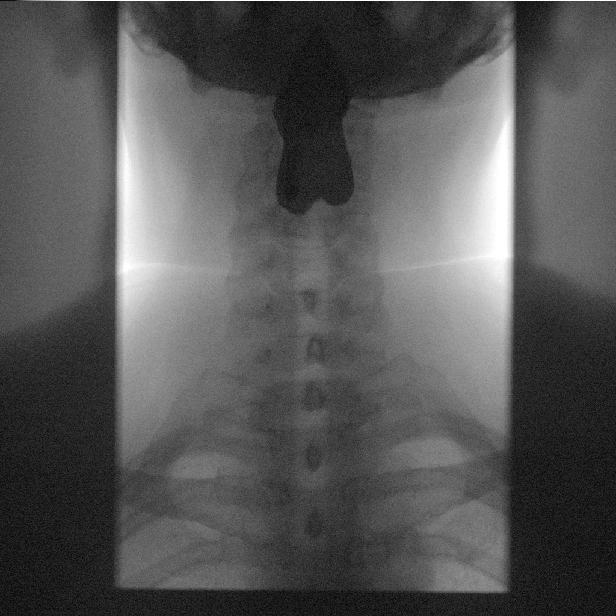
[frame 41/45]
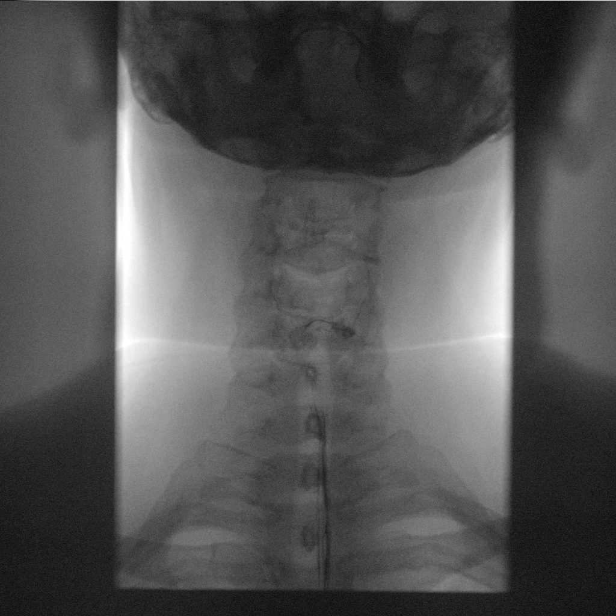

[Series 19: cp_standard · 0.17mm/px · 1 of 112 frames shown (9 of 9)]
[frame 96/112]
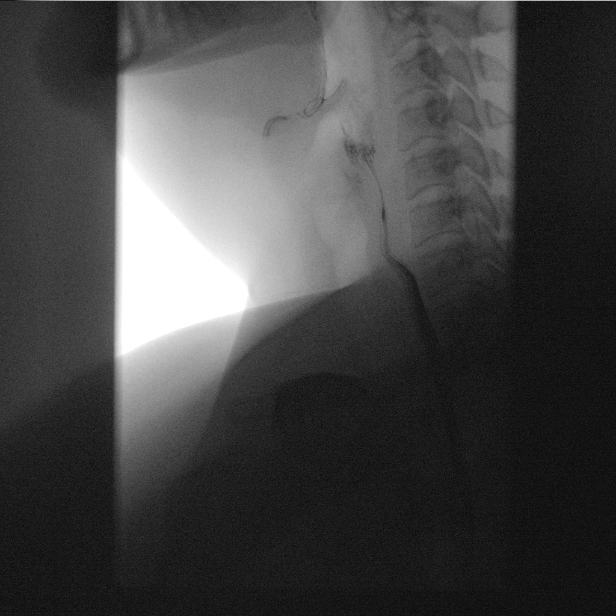

[14 of 24 positions shown; findings below may reference images not displayed]

FINDINGS: UPPER GI SERIES:

Mild intermittent tertiary contractions of the distal third of the
esophagus as can be seen with mild spasm. Normal esophageal
morphology without evidence of esophagitis or ulceration. No
esophageal stricture, diverticula, or mass lesion. No evidence of
hiatal hernia. No spontaneous or inducible gastroesophageal reflux.

Examination of the stomach demonstrated anatomy of post sleeve
gastrectomy with normal rugal folds and areae gastricae. Gastric
mucosa appeared unremarkable without evidence of ulceration,
scarring, or mass lesion. Gastric motility and emptying was normal.
Fluoroscopic examination of the duodenum demonstrates normal
motility and morphology without evidence of ulceration or mass
lesion.
IMPRESSION: 1. No esophageal stricture.

2. No gastroesophageal reflux.

3. No evidence of hiatal hernia.

4. Mild intermittent tertiary contractions of the distal third of
the esophagus as can be seen with mild spasm.

5. Otherwise normal upper GI.

## 2022-08-08 NOTE — Assessment & Plan Note (Addendum)
Self increase Zoloft 2 to 3 weeks ago.  Has not found any relief.  Increase use of Xanax. -Discussed with patient that likely will taper down off Xanax  -Start Atarax 5 mg at night.  Can increase to 3 times daily and titrate up to 10 mg 3 times daily if needed.  -Recommend CBT -I think she would benefit from a psychiatry evaluation, discussed the benefits of having mental health provider involved in her care, offered referral today, patient declined today. -Mental health resources provided  -Follow up in 1-2 weeks -Strict return precautions provided

## 2022-08-08 NOTE — Assessment & Plan Note (Addendum)
-  Start metformin XR 500 mg daily -Follow-up in 4 weeks.

## 2022-08-08 NOTE — Telephone Encounter (Signed)
Patient called to make sure her MyChart message went through. Patient wanted to speak to Dr Volanda Napoleon, patient was seen yesterday by Dr Volanda Napoleon and she wanted to add more information to their conversation, (mental health)

## 2022-08-08 NOTE — Telephone Encounter (Signed)
Pt calling about her mychart message

## 2022-08-09 ENCOUNTER — Other Ambulatory Visit: Payer: Self-pay | Admitting: Internal Medicine

## 2022-08-09 ENCOUNTER — Telehealth: Payer: Self-pay

## 2022-08-09 DIAGNOSIS — F419 Anxiety disorder, unspecified: Secondary | ICD-10-CM

## 2022-08-09 DIAGNOSIS — F32A Depression, unspecified: Secondary | ICD-10-CM

## 2022-08-09 NOTE — Telephone Encounter (Signed)
Called pt again to tell her Dr. Audrie Gallus advice:  McLean-Scocuzza, Nino Glow, MD  You; Jeralyn Bennett A, CMA 56 minutes ago (1:39 PM)   TM Atarax lowest dose is 10 mg she can use a pill cutter or take it 2x per day instead of 3  [facility]  What does she need?   Rec [facility] for anxiety or call see who insurance approve for psychiatry/therapy asap?       Pt stated that the last time she checked the pharmacy didn't have the px for zoloft. She wanted to know if the Dr. Could just send it in again just in case. I told pt I will also reach out to the pharmacy and see if they have it or not. Pt was notified about getting a pill cutter and try cutting back the Atarax. Pt stated she is only taking 1 a day and it still is too much she still is drowsy. Pt said pharmacist told her that the only way to cut a pill in half is if it has the score in the middle if not she will be getting the whole does. I explained to her if she cut the pill in half with a pill cutter she would only metabolize what she ingested she verbalized understanding.

## 2022-08-09 NOTE — Telephone Encounter (Signed)
Spoke with pt in regards to her needing medication. Pt saw Dr. Volanda Napoleon and Dr. Volanda Napoleon px her Atarax 10 mg with a sig of take 0.5 tablet. Pt was under the impression that she was going to give her 5 mg tablets because the 10 mg makes her drowsy. Pt states the tablets are not scored where she can cut them in half.   Pt also mentioned that she is on Zoloft 200 mg and she needs that sent in because she has been 4 days with out it. Pt is upset because she has messaged Dr. Loistine Chance CMA via [place] and would like this situation resolved.

## 2022-08-09 NOTE — Telephone Encounter (Signed)
Called:  [id number] [place] [address] [place] [address] Phone:  [phone] Fax:  [phone]  Spoke with pSolonstated that pts zoloft px is there its just on hold because its too soon to refill.   Called pt to update [facility] on [facility] Pt said [facility] will [facility] and see when she last picked up a px because she doesn't understand how she is out of medication if she was px for 2 pills a day and given a 90 day supply (180 tabs).

## 2022-08-09 NOTE — Telephone Encounter (Signed)
Atarax lowest dose is 10 mg she can use a pill cutter or take it 2x per day instead of 3  [facility]ft [facility]  What does she need?  Rec [facility] for anxiety or call see who insurance approve for psychiatry/therapy asap?

## 2022-08-16 ENCOUNTER — Encounter: Payer: Self-pay | Admitting: Family Medicine

## 2022-08-16 DIAGNOSIS — E538 Deficiency of other specified B group vitamins: Secondary | ICD-10-CM | POA: Insufficient documentation

## 2022-08-16 NOTE — Assessment & Plan Note (Signed)
Borderline.  Not on any antihypertensives Discussed weight loss and stress management in hopes to reduce BP Recheck at next visit

## 2022-08-16 NOTE — Assessment & Plan Note (Signed)
Chronic, s/p Bariatric surgery -Continue Vitamin B 12 1000 mcg IM q2w  -Recheck levels at next visit

## 2022-08-20 ENCOUNTER — Ambulatory Visit: Payer: Managed Care, Other (non HMO) | Admitting: Family Medicine

## 2022-08-20 ENCOUNTER — Encounter: Payer: Self-pay | Admitting: Internal Medicine

## 2022-08-30 ENCOUNTER — Encounter: Payer: Self-pay | Admitting: Family Medicine

## 2022-08-30 ENCOUNTER — Ambulatory Visit: Payer: Managed Care, Other (non HMO) | Admitting: Family Medicine

## 2022-08-30 VITALS — BP 110/70 | HR 65 | Temp 99.0°F | Ht 66.0 in | Wt 224.0 lb

## 2022-08-30 DIAGNOSIS — R300 Dysuria: Secondary | ICD-10-CM | POA: Diagnosis not present

## 2022-08-30 DIAGNOSIS — R110 Nausea: Secondary | ICD-10-CM

## 2022-08-30 HISTORY — DX: Nausea: R11.0

## 2022-08-30 LAB — URINALYSIS, MICROSCOPIC ONLY: RBC / HPF: NONE SEEN (ref 0–?)

## 2022-08-30 LAB — COMPREHENSIVE METABOLIC PANEL
ALT: 17 U/L (ref 0–35)
AST: 17 U/L (ref 0–37)
Albumin: 4.3 g/dL (ref 3.5–5.2)
Alkaline Phosphatase: 43 U/L (ref 39–117)
BUN: 13 mg/dL (ref 6–23)
CO2: 28 mEq/L (ref 19–32)
Calcium: 9.3 mg/dL (ref 8.4–10.5)
Chloride: 103 mEq/L (ref 96–112)
Creatinine, Ser: 0.87 mg/dL (ref 0.40–1.20)
GFR: 85.41 mL/min (ref >60.00)
Glucose, Bld: 75 mg/dL (ref 70–99)
Potassium: 4.4 mEq/L (ref 3.5–5.1)
Sodium: 139 mEq/L (ref 135–145)
Total Bilirubin: 0.7 mg/dL (ref 0.2–1.2)
Total Protein: 7.1 g/dL (ref 6.0–8.3)

## 2022-08-30 LAB — CBC WITH DIFFERENTIAL/PLATELET
Basophils Absolute: 0 10*3/uL (ref 0.0–0.1)
Basophils Relative: 0.3 % (ref 0.0–3.0)
Eosinophils Absolute: 0.1 10*3/uL (ref 0.0–0.7)
Eosinophils Relative: 0.8 % (ref 0.0–5.0)
HCT: 42.2 % (ref 36.0–46.0)
Hemoglobin: 14.1 g/dL (ref 12.0–15.0)
Lymphocytes Relative: 20.4 % (ref 12.0–46.0)
Lymphs Abs: 1.3 10*3/uL (ref 0.7–4.0)
MCHC: 33.4 g/dL (ref 30.0–36.0)
MCV: 83 fl (ref 78.0–100.0)
Monocytes Absolute: 0.4 10*3/uL (ref 0.1–1.0)
Monocytes Relative: 6.6 % (ref 3.0–12.0)
Neutro Abs: 4.6 10*3/uL (ref 1.4–7.7)
Neutrophils Relative %: 71.9 % (ref 43.0–77.0)
Platelets: 237 10*3/uL (ref 150.0–400.0)
RBC: 5.09 Mil/uL (ref 3.87–5.11)
RDW: 13.4 % (ref 11.5–15.5)
WBC: 6.4 10*3/uL (ref 4.0–10.5)

## 2022-08-30 LAB — POCT URINALYSIS DIPSTICK
Bilirubin, UA: NEGATIVE
Blood, UA: NEGATIVE
Glucose, UA: NEGATIVE
Ketones, UA: NEGATIVE
Nitrite, UA: NEGATIVE
Protein, UA: NEGATIVE
Spec Grav, UA: 1.02 (ref 1.010–1.025)
Urobilinogen, UA: 1 E.U./dL
pH, UA: 6 (ref 5.0–8.0)

## 2022-08-30 LAB — TSH: TSH: 1.62 u[IU]/mL (ref 0.35–5.50)

## 2022-08-30 MED ORDER — NITROFURANTOIN MONOHYD MACRO 100 MG PO CAPS: 100.0000 mg | ORAL_CAPSULE | Freq: Two times a day (BID) | ORAL | 0 refills | Status: DC

## 2022-08-30 NOTE — Assessment & Plan Note (Signed)
Nausea, few episodes of vomiting, and some urinary symptoms over the last week.  Discussed that this could represent a UTI.  We will proceed with treatment with Macrobid 100 mg twice daily for 5 days.  We will also obtain lab work as outlined to evaluate for any other underlying causes.  We will send urine for culture and microscopy.  If she develops any significantly worsening symptoms, passing blood in her stool, throwing up blood, or develops any fevers she will be reevaluated in person.

## 2022-08-30 NOTE — Progress Notes (Signed)
Tommi Rumps, MD Phone: [phone]  Debbie Townsend is a 36 y.o. female who presents today for same-day visit.  Nausea: Patient notes this has been going on for a week or so.  She has had 3 episodes of nonbloody nonbilious vomiting.  She had some body aches.  She also notes some mild urinary discomfort with urgency and frequency.  She notes no abdominal pain, diarrhea, blood in her stool, hematuria, fevers, or respiratory symptoms.  She is status post hysterectomy.  She notes no sick contacts.  Social History   Tobacco Use  Smoking Status Former   Packs/day: 0.50   Types: Cigarettes   Quit date: 03/2019   Years since quitting: 3.4  Smokeless Tobacco Never    Current Outpatient Medications on File Prior to Visit  Medication Sig Dispense Refill   ALPRAZolam (XANAX) 0.25 MG tablet Take 1 tablet (0.25 mg total) by mouth 2 (two) times daily as needed for anxiety. 30 tablet 2   CALCIUM CITRATE PO Take 1 tablet by mouth daily.     cyanocobalamin (,VITAMIN B-12,) 1000 MCG/ML injection Inject 1 mL (1,000 mcg total) into the muscle every 14 (fourteen) days. 14 mL 1   hydrOXYzine (ATARAX) 10 MG tablet Take 0.5 tablets (5 mg total) by mouth 3 (three) times daily as needed. 30 tablet 0   metFORMIN (GLUCOPHAGE-XR) 500 MG 24 hr tablet Take 1 tablet (500 mg total) by mouth daily with breakfast. 90 tablet 0   Multiple Vitamins-Minerals (MULTIVITAMIN WITH MINERALS) tablet Take 1 tablet by mouth daily. Bariatric advantage one a day     NEEDLE, DISP, 25 G 25G X 1-1/2" MISC 1 Device by Does not apply route every 14 (fourteen) days. 12 each 3   NURTEC 75 MG TBDP Take 1 tablet by mouth as needed.     OVER THE COUNTER MEDICATION Take 1 tablet by mouth at bedtime. Tranquil Sleep     pantoprazole (PROTONIX) 40 MG tablet Take 1 tablet by mouth daily.     sertraline (ZOLOFT) 100 MG tablet Take 2 tablets (200 mg total) by mouth daily. 180 tablet 3   No current facility-administered medications on file  prior to visit.     ROS see history of present illness  Objective  Physical Exam Vitals:   08/30/22 1009  BP: 110/70  Pulse: 65  Temp: 99 F (37.2 C)  SpO2: 97%    BP Readings from Last 3 Encounters:  08/30/22 110/70  08/06/22 138/88  03/15/22 (!) 147/90   Wt Readings from Last 3 Encounters:  08/30/22 224 lb (101.6 kg)  08/06/22 232 lb 9.6 oz (105.5 kg)  03/15/22 228 lb (103.4 kg)    Physical Exam Constitutional:      General: She is not in acute distress.    Appearance: She is not diaphoretic.  Cardiovascular:     Rate and Rhythm: Normal rate and regular rhythm.     Heart sounds: Normal heart sounds.  Pulmonary:     Effort: Pulmonary effort is normal.     Breath sounds: Normal breath sounds.  Abdominal:     General: Bowel sounds are normal. There is no distension.     Palpations: Abdomen is soft.     Tenderness: There is abdominal tenderness (Mild suprapubic tenderness).  Skin:    General: Skin is warm and dry.  Neurological:     Mental Status: She is alert.      Assessment/Plan: Please see individual problem list.  Problem List Items Addressed This Visit  Nausea - Primary    Nausea, few episodes of vomiting, and some urinary symptoms over the last week.  Discussed that this could represent a UTI.  We will proceed with treatment with Macrobid 100 mg twice daily for 5 days.  We will also obtain lab work as outlined to evaluate for any other underlying causes.  We will send urine for culture and microscopy.  If she develops any significantly worsening symptoms, passing blood in her stool, throwing up blood, or develops any fevers she will be reevaluated in person.      Relevant Orders   Comp Met (CMET)   CBC w/Diff   TSH   Other Visit Diagnoses     Dysuria       Relevant Medications   nitrofurantoin, macrocrystal-monohydrate, (MACROBID) 100 MG capsule   Other Relevant Orders   POCT Urinalysis Dipstick   Urine Culture   Urine Microscopic         Return if symptoms worsen or fail to improve.   Tommi Rumps, MD [place]

## 2022-08-30 NOTE — Patient Instructions (Signed)
Nice to meet you. We are going to proceed with treatment for a UTI with Macrobid.  You will take this twice daily for 5 days.  We will send your urine for culture and microscopy to determine if there is actually bacteria in your urine and then to determine if the antibiotic is correct. We will also get some other lab work to evaluate for other causes of your nausea. If you develop any worsening symptoms, blood in your stool, vomiting of blood, or any fevers please be reevaluated.

## 2022-08-31 LAB — URINE CULTURE
MICRO NUMBER:: 13923932
SPECIMEN QUALITY:: ADEQUATE

## 2022-09-04 ENCOUNTER — Telehealth: Payer: Self-pay | Admitting: Family Medicine

## 2022-09-04 NOTE — Telephone Encounter (Signed)
Called patient on 9/20 to reschedule appointment. Offered patient a 8am on 10/08/2022. She was upset because this appointment was already rescheduled once before. Patient stated she did not want to reschedule at this time.

## 2022-09-23 ENCOUNTER — Ambulatory Visit: Payer: Managed Care, Other (non HMO) | Admitting: Family Medicine

## 2022-09-25 ENCOUNTER — Encounter: Payer: Self-pay | Admitting: Internal Medicine

## 2022-09-26 NOTE — Telephone Encounter (Signed)
Patient is scheduled for 10/02/22 at 10:00 am to follow up on the increase in zoloft and adding the metformin

## 2022-10-02 ENCOUNTER — Ambulatory Visit: Payer: Managed Care, Other (non HMO) | Admitting: Family Medicine

## 2022-10-03 ENCOUNTER — Telehealth (INDEPENDENT_AMBULATORY_CARE_PROVIDER_SITE_OTHER): Payer: Managed Care, Other (non HMO) | Admitting: Family Medicine

## 2022-10-03 DIAGNOSIS — E669 Obesity, unspecified: Secondary | ICD-10-CM

## 2022-10-03 DIAGNOSIS — F32A Depression, unspecified: Secondary | ICD-10-CM

## 2022-10-03 DIAGNOSIS — F419 Anxiety disorder, unspecified: Secondary | ICD-10-CM

## 2022-10-03 DIAGNOSIS — E88819 Insulin resistance, unspecified: Secondary | ICD-10-CM | POA: Diagnosis not present

## 2022-10-03 MED ORDER — WEGOVY 0.25 MG/0.5ML ~~LOC~~ SOAJ: 0.2500 mg | SUBCUTANEOUS | 0 refills | Status: DC

## 2022-10-03 MED ORDER — BUSPIRONE HCL 10 MG PO TABS: 10.0000 mg | ORAL_TABLET | Freq: Three times a day (TID) | ORAL | 0 refills | Status: DC | PRN

## 2022-10-03 NOTE — Progress Notes (Addendum)
[facility] Visit  Patient consented to have virtual visit and was identified by name and date of birth. Method of visit: Video  Encounter participants: Patient: Debbie Townsend - located at work Provider: Carollee Leitz - located at office Others (if applicable): none  Chief Complaint: follow up anxiety/weight management  HPI:  Anxiety Reports has not had any improvement with Atarax secondary to drowsiness.  Has been using friends Xanax to help with anxiety.  On maximum dose of Zoloft.  Denies any SI/HI.   Has not been able to seek therapy yet.  Weight management Did not tolerate Metformin secondary to nausea.  Would like to pursue injectable again.  No history of thyroid cancer or pancreatitis.    ROS: per HPI  Pertinent PMHx:  Bariatric surgery Anxiety  Exam:  LMP 02/15/2019   Respiratory: Speaking in full sentences.  No increased work of breathing.  Assessment/Plan:  Anxiety and depression Current medication not effective.  Did not tolerate Atarax.  On max dose of Zoloft. -Discontinue Atarax -Start Buspar 10 mg TID prn -Continue Zoloft 200 mg daily -Consider switching SSRI, may benefit from Fluoxetine trial  -Encouraged to seek therapy for anxiety counseling -If remains refractive to treatment, recommend psychiatry consult -Mental health resources provided -Follow up in 1 week -Strict return precautions provided  Insulin resistance Elevated BMI 36.15, s/p gastric sleeve.  Did not tolerate Metformin side effect of nausea. -Restart Semaglutide 0.25 mg weekly x 4 weeks, increase to 0.5 mg after 4 weeks if tolerated -Follow up in 8 weeks to reassess

## 2022-10-03 NOTE — Patient Instructions (Signed)
It was a pleasure meeting you today. Thank you for allowing me to take part in your health care.  Our goals for today as we discussed include:  For your anxiety and depression Continue Zoloft 200 mg daily Start Buspar 10 mg three times a day.  Can use as needed Stop Atarax  For your weight management/insulin resistance Start Wegovy 0.25 mg weekly Follow up in 2 months   Please follow-up with PCP in 1 week  If you have any questions or concerns, please do not hesitate to call the office at [phone].  I look forward to our next visit and until then take care and stay safe.  Regards,   Carollee Leitz, MD   [facility]

## 2022-10-12 ENCOUNTER — Encounter: Payer: Self-pay | Admitting: Family Medicine

## 2022-10-12 NOTE — Assessment & Plan Note (Signed)
Elevated BMI 36.15, s/p gastric sleeve.  Did not tolerate Metformin side effect of nausea. -Restart Semaglutide 0.25 mg weekly x 4 weeks, increase to 0.5 mg after 4 weeks if tolerated -Follow up in 8 weeks to reassess

## 2022-10-12 NOTE — Assessment & Plan Note (Addendum)
Current medication not effective.  Did not tolerate Atarax.  On max dose of Zoloft. -Discontinue Atarax -Start Buspar 10 mg TID prn -Continue Zoloft 200 mg daily -Consider switching SSRI, may benefit from Fluoxetine trial  -Encouraged to seek therapy for anxiety counseling -If remains refractive to treatment, recommend psychiatry consult -Mental health resources provided -Follow up in 1 week -Strict return precautions provided

## 2022-10-16 ENCOUNTER — Other Ambulatory Visit: Payer: Self-pay | Admitting: Family Medicine

## 2022-10-16 DIAGNOSIS — Z1231 Encounter for screening mammogram for malignant neoplasm of breast: Secondary | ICD-10-CM

## 2022-10-21 ENCOUNTER — Encounter: Payer: Self-pay | Admitting: Family Medicine

## 2022-10-21 ENCOUNTER — Ambulatory Visit (INDEPENDENT_AMBULATORY_CARE_PROVIDER_SITE_OTHER): Payer: Managed Care, Other (non HMO) | Admitting: Family Medicine

## 2022-10-21 VITALS — BP 132/70 | HR 87 | Temp 98.2°F | Ht 66.0 in | Wt 227.4 lb

## 2022-10-21 DIAGNOSIS — F32A Depression, unspecified: Secondary | ICD-10-CM

## 2022-10-21 DIAGNOSIS — F419 Anxiety disorder, unspecified: Secondary | ICD-10-CM

## 2022-10-21 MED ORDER — FLUOXETINE HCL 10 MG PO TABS: 10.0000 mg | ORAL_TABLET | Freq: Every day | ORAL | 0 refills | Status: DC

## 2022-10-21 MED ORDER — SERTRALINE HCL 100 MG PO TABS: 150.0000 mg | ORAL_TABLET | Freq: Every day | ORAL | 0 refills | Status: DC

## 2022-10-21 NOTE — Telephone Encounter (Signed)
Yes that would be ok.  

## 2022-10-21 NOTE — Progress Notes (Signed)
SUBJECTIVE:   CHIEF COMPLAINT / HPI: follow up mood  Presents for follow up for anxiety and depression. Virtual visit on 10/19 and given no improvement with Atarax, decision was made with patient to trial Buspar 10 mg TID and consider switching SSRI.  Since then patient reports improvement in symptoms no improvement in symptoms.  Unable to tolerate Atarax causing drowsiness.  Buspar not effective at current dose.  Continues to take friend's Xanax that seems to help her.  Associated symptoms include increased agitation, anxiety, depressed mood.  Denies any SI/SI   Scalp lesions Would like referral for calcifications on scalp.  Has been there for some time.  Noted on CT head 03/23.  She would like them removed as they cause discomfort and appear to be getting bigger.  Denies any fevers and not notable for any discharge.  PERTINENT  PMH / PSH:  Anxiety/Depression Bariatric surgery  OBJECTIVE:   BP 132/70 (BP Location: Left Arm, Patient Position: Sitting, Cuff Size: Normal)   Pulse 87   Temp 98.2 F (36.8 C) (Oral)   Ht '5\' 6"'$  (1.676 m)   Wt 227 lb 6.4 oz (103.1 kg)   LMP 02/15/2019   SpO2 98%   BMI 36.70 kg/m    General: Alert, no acute distress Head: tender mobile firm lesions noted in multiple areas of scalp.  No erythema, edema or discharge noted Cardio: Normal S1 and S2, RRR, no r/m/g Pulm: CTAB, normal work of breathing     10/21/2022    9:49 AM 10/03/2022   11:42 AM 08/06/2022    1:25 PM 08/06/2022   11:18 AM  Depression screen PHQ 2/9  Decreased Interest  '2 2 1 2  '$ Down, Depressed, Hopeless  '2 2 1 1  '$ PHQ - 2 Score  '4 4 2 3  '$ Altered sleeping  '2 3 3   '$ Tired, decreased energy  '3 3 3   '$ Change in appetite  '3 1 1   '$ Feeling bad or failure about yourself   2 0 2   Trouble concentrating  '3 2 3   '$ Moving slowly or fidgety/restless  '2 2 3   '$ Suicidal thoughts   0 0   PHQ-9 Score  '19 15 17   '$ Difficult doing work/chores  Somewhat difficult Somewhat difficult Somewhat difficult          10/21/2022    9:49 AM 10/03/2022   11:43 AM 08/06/2022    1:25 PM  GAD 7 : Generalized Anxiety Score  Nervous, Anxious, on Edge  '3 1 3  '$ Control/stop worrying  '1 1 2  '$ Worry too much - different things  '2 1 2  '$ Trouble relaxing  '2 3 2  '$ Restless  '2 3 2  '$ Easily annoyed or irritable  '3 3 3  '$ Afraid - awful might happen  0 0 1  Total GAD 7 Score  '13 12 15  '$ Anxiety Difficulty  Somewhat difficult Somewhat difficult Somewhat difficult     ASSESSMENT/PLAN:   Anxiety and depression PHQ 9 and GAD 7 has increased from previous visit.  Buspar not effective so has not been taking.  Atarax has not helped.  Decision with patient to trial different SSRI. -Decrease Zoloft to 100 mg daily  -Start Fluoxetine 10 mg daily -Increase Buspar to 15 mg BID -Mental health resources provided -Continue to encourage CBT -Follow up in 1 week or sooner if needed -Strict return precautions provided  Calcinosis Stable scalp calcifications noted on CT head 03/23.  Now causing  discomfort.  Requesting referral for removal -Refer to Dermatology     Carollee Leitz, MD

## 2022-10-21 NOTE — Patient Instructions (Signed)
It was a pleasure meeting you today. Thank you for allowing me to take part in your health care.  Our goals for today as we discussed include:  For your anxiety Start Prozac 10 mg daily Decrease Zoloft to 150 mg daily for 2 weeks, then 100 mg for 2 weeks, then 50 mg for 2 weeks, then 25 mg for 2 weeks until off.  Will increase your Prozac as we decrease the Zoloft Increase Buspar to 15 mg twice a day Recommend therapy to discuss stress/anxiety management Recommend Psychiatry evaluation.  Would refer to Dr Milbert Coulter.  Please let me know if you would like to proceed with referral  For [place] [phone] Address: [place], [place] [address] Hours: Open 24/7, No appointment required.    For your scalp calcifications Will send referral to dermatology   Please follow-up with PCP in 1 week  If you have any questions or concerns, please do not hesitate to call the office at [phone].  I look forward to our next visit and until then take care and stay safe.  Regards,   Carollee Leitz, MD   [facility]

## 2022-10-28 ENCOUNTER — Encounter: Payer: Self-pay | Admitting: Family Medicine

## 2022-10-28 ENCOUNTER — Telehealth: Payer: Self-pay | Admitting: Family Medicine

## 2022-10-28 ENCOUNTER — Ambulatory Visit: Payer: Managed Care, Other (non HMO) | Admitting: Family Medicine

## 2022-10-28 NOTE — Telephone Encounter (Signed)
Patient called and stated she has to cancel appointment for today, because of baby sitter issues. Patient stated that Dr Volanda Napoleon said if she would have to cancel and reschedule to let her know so she can get her in. Patient is switching medications.

## 2022-10-28 NOTE — Telephone Encounter (Signed)
Patient states she is following up on her previous message.  Patient would like to request a call back.

## 2022-10-29 NOTE — Telephone Encounter (Signed)
Patient called back to schedule.  I scheduled patient for next available office visit which is 11/04/2022.  Patient states Dr. Carollee Leitz told her to have her nurse speak with her directly because she may need to be double booked on Dr. Loistine Chance schedule.  Patient has questions regarding tapering her medication.  Patient requests a call back.

## 2022-10-29 NOTE — Telephone Encounter (Signed)
Patient is scheduled for Thursday 10/31/22 at 4:00.

## 2022-10-31 ENCOUNTER — Encounter: Payer: Self-pay | Admitting: Family Medicine

## 2022-10-31 ENCOUNTER — Ambulatory Visit: Payer: Managed Care, Other (non HMO) | Admitting: Family Medicine

## 2022-10-31 VITALS — BP 118/76 | HR 73 | Temp 98.2°F | Ht 66.0 in | Wt 231.2 lb

## 2022-10-31 DIAGNOSIS — F32A Depression, unspecified: Secondary | ICD-10-CM

## 2022-10-31 DIAGNOSIS — I10 Essential (primary) hypertension: Secondary | ICD-10-CM

## 2022-10-31 DIAGNOSIS — F419 Anxiety disorder, unspecified: Secondary | ICD-10-CM | POA: Diagnosis not present

## 2022-10-31 DIAGNOSIS — L659 Nonscarring hair loss, unspecified: Secondary | ICD-10-CM

## 2022-10-31 DIAGNOSIS — G43019 Migraine without aura, intractable, without status migrainosus: Secondary | ICD-10-CM | POA: Diagnosis not present

## 2022-10-31 DIAGNOSIS — R21 Rash and other nonspecific skin eruption: Secondary | ICD-10-CM

## 2022-10-31 MED ORDER — SERTRALINE HCL 50 MG PO TABS: 50.0000 mg | ORAL_TABLET | Freq: Every day | ORAL | 0 refills | Status: DC

## 2022-10-31 MED ORDER — FLUOXETINE HCL 20 MG PO TABS: 20.0000 mg | ORAL_TABLET | Freq: Every day | ORAL | 0 refills | Status: DC

## 2022-10-31 NOTE — Patient Instructions (Addendum)
It was a pleasure meeting you today. Thank you for allowing me to take part in your health care.  Our goals for today as we discussed include: So happy you are feeling a little better  For your anxiety Increase Fluoxetine to 20 mg daily Decrease Sertraline to 50 mg daily   For your Migraine You can see if using Propranolol can help with prophylaxis   Please follow-up with PCP in 1 week 11/20 at 340p  If you have any questions or concerns, please do not hesitate to call the office at [phone].  I look forward to our next visit and until then take care and stay safe.  Regards,   Carollee Leitz, MD   [facility]

## 2022-10-31 NOTE — Progress Notes (Signed)
SUBJECTIVE:   Chief Complaint  Patient presents with   Follow-up    Follow up- Medications & Rash   HPI Presents for follow up for mood disorder. Seen in clinic on 11/06 and Zoloft decreased to 100 mg daily, fluoxetine 10 mg was initiated and BuSpar was increased to 50 mg twice daily.  Since then patient reports some improvement in symptoms.  Reports still has some anxiety and feeling fidgety but not as bad as previously.  Endorses better mood and tolerating fluoxetine well.  Does not like BuSpar and does not feel this is helping.  Would like to increase fluoxetine and discontinue BuSpar.  She reports still feeling fidgety at times  Rash Symptoms started few days ago.  Itchy rash noted along abdomen.  Has not gotten any worse.  Not blistering.  Most concern for possible shingles.  Denies any fever, pain change in environment, changes in detergents, changing cream.  Has used cortisone cream which has helped.  No recent use of antibiotics.  New medications include Prozac.  Migraines Patient wondering if elevated homocystine levels could worsen migraines.  Had seen something on TikTok and husband had recommended she ask for labs to be done.  PERTINENT PMH / PSH: Mood disorder    OBJECTIVE:  BP 118/76 (BP Location: Left Arm, Patient Position: Sitting, Cuff Size: Normal)   Pulse 73   Temp 98.2 F (36.8 C) (Oral)   Ht '5\' 6"'$  (1.676 m)   Wt 231 lb 3.2 oz (104.9 kg)   LMP 02/15/2019   SpO2 99%   BMI 37.32 kg/m    Physical Exam Vitals reviewed.  Constitutional:      General: She is not in acute distress.    Appearance: Normal appearance. She is obese. She is not ill-appearing.  HENT:     Head: Normocephalic.  Eyes:     Conjunctiva/sclera: Conjunctivae normal.  Cardiovascular:     Rate and Rhythm: Normal rate.  Pulmonary:     Effort: Pulmonary effort is normal.  Musculoskeletal:        General: Normal range of motion.     Cervical back: Normal range of motion.  Neurological:      Mental Status: She is alert and oriented to person, place, and time. Mental status is at baseline.  Psychiatric:        Mood and Affect: Mood normal.        Behavior: Behavior normal.        Thought Content: Thought content normal.        Judgment: Judgment normal.        10/31/2022    4:24 PM 10/21/2022    9:49 AM 10/03/2022   11:42 AM 08/06/2022    1:25 PM  Depression screen PHQ 2/9  Decreased Interest  '1 2 2 1  '$ Down, Depressed, Hopeless  '1 2 2 1  '$ PHQ - 2 Score  '2 4 4 2  '$ Altered sleeping  '3 2 3 3  '$ Tired, decreased energy  '2 3 3 3  '$ Change in appetite  '3 3 1 1  '$ Feeling bad or failure about yourself   1 2 0 2  Trouble concentrating  '3 3 2 3  '$ Moving slowly or fidgety/restless  '2 2 2 3  '$ Suicidal thoughts  0  0 0  PHQ-9 Score  '16 19 15 17  '$ Difficult doing work/chores  Somewhat difficult Somewhat difficult Somewhat difficult Somewhat difficult        10/31/2022    4:24 PM 10/21/2022  9:49 AM 10/03/2022   11:43 AM  GAD 7 : Generalized Anxiety Score  Nervous, Anxious, on Edge  '2 3 1  '$ Control/stop worrying  '2 1 1  '$ Worry too much - different things  '2 2 1  '$ Trouble relaxing  '2 2 3  '$ Restless  '2 2 3  '$ Easily annoyed or irritable  '2 3 3  '$ Afraid - awful might happen  1 0 0  Total GAD 7 Score  '13 13 12  '$ Anxiety Difficulty  Somewhat difficult Somewhat difficult Somewhat difficult     ASSESSMENT/PLAN:  Anxiety and depression Assessment & Plan: PHQ 9 and GAD 7 has improved from previous visit.  Prozac helping.  Buspar not effective. -Decrease Zoloft to 50 mg daily  -Increase Fluoxetine 20 mg daily -Discontinue Buspar  -Mental health resources provided -Continue to encourage CBT -Follow up in 1 week or sooner if needed -Strict return precautions provided  Orders: -     Sertraline HCl; Take 1 tablet (50 mg total) by mouth daily.  Dispense: 30 tablet; Refill: 0  Essential hypertension Assessment & Plan: Chronic.  Stable.  Well-controlled without  antihypertensives.   Intractable migraine without aura and without status migrainosus Assessment & Plan: Follows with neurology. Could consider propranolol for prophylaxis.  Has tried Topamax in the past.  Currently on Nurtec as needed.  Could consider daily use of Nurtec.    Hair loss Assessment & Plan: Follows with dermatology.   Could consider Rogaine treatment if not already discussed.   Rash Assessment & Plan: Likely contact dermatitis.  Low suspicion for shingles given no blistering or pain and improved with hydrocortisone use. Can continue OTC hydrocortisone cream twice daily If any blisters noted tomorrow please notify MD and we will send a prescription for antiviral treatment. Follow-up if worsening symptoms.      PDMP reviewed  Return in about 1 week (around 11/07/2022).  Carollee Leitz, MD

## 2022-11-02 ENCOUNTER — Encounter: Payer: Self-pay | Admitting: Family Medicine

## 2022-11-02 HISTORY — DX: Other disorders of calcium metabolism: E83.59

## 2022-11-02 NOTE — Assessment & Plan Note (Addendum)
Stable scalp calcifications noted on CT head 03/23.  Now causing discomfort.  Requesting referral for removal -Refer to Dermatology

## 2022-11-02 NOTE — Assessment & Plan Note (Addendum)
PHQ 9 and GAD 7 has increased from previous visit.  Buspar not effective so has not been taking.  Atarax has not helped.  Decision with patient to trial different SSRI. -Decrease Zoloft to 100 mg daily  -Start Fluoxetine 10 mg daily -Increase Buspar to 15 mg BID -Mental health resources provided -Continue to encourage CBT -Follow up in 1 week or sooner if needed -Strict return precautions provided

## 2022-11-04 ENCOUNTER — Ambulatory Visit: Payer: Managed Care, Other (non HMO) | Admitting: Family Medicine

## 2022-11-06 ENCOUNTER — Encounter: Payer: Self-pay | Admitting: Family Medicine

## 2022-11-06 ENCOUNTER — Ambulatory Visit: Payer: Managed Care, Other (non HMO) | Admitting: Family Medicine

## 2022-11-06 VITALS — BP 120/80 | HR 78 | Temp 97.9°F | Ht 66.0 in | Wt 232.2 lb

## 2022-11-06 DIAGNOSIS — F419 Anxiety disorder, unspecified: Secondary | ICD-10-CM

## 2022-11-06 DIAGNOSIS — E559 Vitamin D deficiency, unspecified: Secondary | ICD-10-CM

## 2022-11-06 DIAGNOSIS — E669 Obesity, unspecified: Secondary | ICD-10-CM

## 2022-11-06 DIAGNOSIS — E538 Deficiency of other specified B group vitamins: Secondary | ICD-10-CM | POA: Diagnosis not present

## 2022-11-06 DIAGNOSIS — R519 Headache, unspecified: Secondary | ICD-10-CM

## 2022-11-06 DIAGNOSIS — R21 Rash and other nonspecific skin eruption: Secondary | ICD-10-CM

## 2022-11-06 DIAGNOSIS — F32A Depression, unspecified: Secondary | ICD-10-CM

## 2022-11-06 DIAGNOSIS — R0683 Snoring: Secondary | ICD-10-CM

## 2022-11-06 NOTE — Patient Instructions (Addendum)
It was a pleasure meeting you today. Thank you for allowing me to take part in your health care.  Our goals for today as we discussed include:  Discontinue Zoloft Your screening for depression and anxiety has dramatically improved Continue fluoxetine 20 mg daily.   If over the holidays you feel you are starting to regress you can increase your fluoxetine to 30 mg until we see each other next week.  We will discuss your vitamin B12 at your next visit.  I think is important that we need to restart you back on this.   If you have any questions or concerns, please do not hesitate to call the office at [phone].  I look forward to our next visit and until then take care and stay safe.  Regards,   Carollee Leitz, MD   [facility]

## 2022-11-06 NOTE — Progress Notes (Signed)
SUBJECTIVE:   Chief Complaint  Patient presents with   Follow-up    1 week   HPI Presents for follow up for mood disorder. Seen in clinic on 11/16 and Zoloft was decreased to 50 mg daily, Prozac increased to 20 mg daily, BuSpar discontinued secondary to side effect of sleepiness.  Since then patient reports significant improvement in mood and anxiety.  She reports that she is not as frustrated or agitated as previously.  Still has anxiety but reports has been significantly improved since initiation of Prozac.  History of gastric bypass. Resulting in vitamin B12 deficiency.  Patient prescribed vitamin B12 injections biweekly but has not been taking medication.  PERTINENT PMH / PSH: Mood disorder Vitamin B12 deficiency Migraines    OBJECTIVE:  BP 120/80 (BP Location: Right Arm, Patient Position: Sitting, Cuff Size: Normal)   Pulse 78   Temp 97.9 F (36.6 C) (Oral)   Ht '5\' 6"'$  (1.676 m)   Wt 232 lb 3.2 oz (105.3 kg)   LMP 02/15/2019   SpO2 95%   BMI 37.48 kg/m    Physical Exam Vitals reviewed.  Constitutional:      General: She is not in acute distress.    Appearance: Normal appearance. She is obese. She is not ill-appearing, toxic-appearing or diaphoretic.  Eyes:     General:        Right eye: No discharge.        Left eye: No discharge.     Conjunctiva/sclera: Conjunctivae normal.  Cardiovascular:     Rate and Rhythm: Regular rhythm.  Pulmonary:     Effort: Pulmonary effort is normal.  Musculoskeletal:        General: Normal range of motion.  Skin:    General: Skin is warm and dry.  Neurological:     General: No focal deficit present.     Mental Status: She is alert and oriented to person, place, and time. Mental status is at baseline.  Psychiatric:        Mood and Affect: Mood normal.        Behavior: Behavior normal.        Thought Content: Thought content normal.        Judgment: Judgment normal.    Flowsheet Row Office Visit from 11/06/2022 in [place] Office Visit from 10/31/2022 in [place] Office Visit from 10/21/2022 in [facility]  Thoughts that you would be better off dead, or of hurting yourself in some way Not at all Not at all --  [pt refused to answer this question]  PHQ-9 Total Score '11 16 19            '$ 11/06/2022    3:30 PM 10/31/2022    4:24 PM 10/21/2022    9:49 AM 10/03/2022   11:43 AM  GAD 7 : Generalized Anxiety Score  Nervous, Anxious, on Edge '2 2 3 1  '$ Control/stop worrying '1 2 1 1  '$ Worry too much - different things '1 2 2 1  '$ Trouble relaxing '2 2 2 3  '$ Restless '1 2 2 3  '$ Easily annoyed or irritable '2 2 3 3  '$ Afraid - awful might happen 0 1 0 0  Total GAD 7 Score '9 13 13 12  '$ Anxiety Difficulty  Somewhat difficult Somewhat difficult Somewhat difficult      ASSESSMENT/PLAN:  Obesity (BMI 30-39.9) Assessment & Plan: Continue to try and obtain [facility].  Has been approved by insurance but unfortunately it  is remains on backorder Continue diet and exercise   Vitamin B 12 deficiency Assessment & Plan: Check levels today Consider vitamin B12 sublingual if not wanting to continue injections. Will discuss at next visit.  Orders: -     Vitamin B12; Future  Vitamin D deficiency -     VITAMIN D 25 Hydroxy (Vit-D Deficiency, Fractures); Future  Anxiety and depression Assessment & Plan: Significantly improved since initiation of fluoxetine.  Has not used BuSpar.  Last dose of Zoloft will be in 3 days. Discontinue Zoloft. Continue fluoxetine 20 mg daily, can increase to 30 mg in 3 to 4 days if increasing signs of anxiety. Encourage CBT Follow-up in 1 week.   Rash Assessment & Plan: Resolved    Recently seen by ophthalmology who recommended repeat MRI and sleep study.  If no other contraindications after MRI plan for a LP to assess opening pressure. She is also following with neurology for her migraines.  Patient initially requested referral for  sleep studies but canceled as her neurologist had set up her referral.   PDMP reviewed  Return in about 1 week (around 11/13/2022) for mood.  Carollee Leitz, MD

## 2022-11-10 ENCOUNTER — Encounter: Payer: Self-pay | Admitting: Family Medicine

## 2022-11-12 ENCOUNTER — Telehealth: Payer: Self-pay

## 2022-11-13 ENCOUNTER — Telehealth: Payer: Self-pay

## 2022-11-13 ENCOUNTER — Other Ambulatory Visit: Payer: Self-pay | Admitting: Family Medicine

## 2022-11-13 DIAGNOSIS — F419 Anxiety disorder, unspecified: Secondary | ICD-10-CM

## 2022-11-13 MED ORDER — FLUOXETINE HCL 20 MG PO TABS: 20.0000 mg | ORAL_TABLET | Freq: Every day | ORAL | 0 refills | Status: DC

## 2022-11-13 MED ORDER — FLUOXETINE HCL 10 MG PO TABS
10.0000 mg | ORAL_TABLET | Freq: Every day | ORAL | 0 refills | Status: DC
Start: 2022-11-13 — End: 2022-12-12

## 2022-11-13 NOTE — Telephone Encounter (Signed)
I called and spoke with the patient and informed her that the provider stated she was sending in a 20 mg and a 10 mg for the patient to take a total of 30 mg per day of the Prozac I informed her that a 30 day supply of each was sent in and she understood.  Patient also asked me to mail her the genetic packet for the medicaitn allergies and I mailed that today.  Zared Knoth,cma

## 2022-11-17 ENCOUNTER — Encounter: Payer: Self-pay | Admitting: Family Medicine

## 2022-11-17 DIAGNOSIS — R21 Rash and other nonspecific skin eruption: Secondary | ICD-10-CM | POA: Insufficient documentation

## 2022-11-17 NOTE — Assessment & Plan Note (Signed)
Chronic.  Stable.  Well-controlled without antihypertensives.

## 2022-11-17 NOTE — Assessment & Plan Note (Addendum)
Follows with neurology. Could consider propranolol for prophylaxis.  Has tried Topamax in the past.  Currently on Nurtec as needed.  Could consider daily use of Nurtec.

## 2022-11-17 NOTE — Assessment & Plan Note (Addendum)
PHQ 9 and GAD 7 has improved from previous visit.  Prozac helping.  Buspar not effective. -Decrease Zoloft to 50 mg daily  -Increase Fluoxetine 20 mg daily -Discontinue Buspar  -Mental health resources provided -Continue to encourage CBT -Follow up in 1 week or sooner if needed -Strict return precautions provided

## 2022-11-17 NOTE — Assessment & Plan Note (Signed)
Likely contact dermatitis.  Low suspicion for shingles given no blistering or pain and improved with hydrocortisone use. Can continue OTC hydrocortisone cream twice daily If any blisters noted tomorrow please notify MD and we will send a prescription for antiviral treatment. Follow-up if worsening symptoms.

## 2022-11-17 NOTE — Assessment & Plan Note (Signed)
Follows with dermatology.   Could consider Rogaine treatment if not already discussed.

## 2022-11-18 ENCOUNTER — Telehealth: Payer: Self-pay | Admitting: Family Medicine

## 2022-11-18 NOTE — Telephone Encounter (Signed)
Patient's work schedule has changed and she can not come in on 11/20/2022 because her work scheduled has changed. Offered next appointment on 12/28, she states that we need to notify Dr Volanda Napoleon, so she can see her sooner.

## 2022-11-19 ENCOUNTER — Telehealth: Payer: Self-pay | Admitting: Family Medicine

## 2022-11-19 NOTE — Telephone Encounter (Signed)
Called patient to discuss follow up anxiety.  Has now completed Zoloft and increased Prozac to 30 mg daily.  Reports feeling more anxious since coming off the Zoloft.  Had to take Buspar which helped ease anxiety a little but still made her feel drowsy.  Has now started therapy and reports not all effective after one visit.  She has reached out to Dr Waylan Boga office to schedule an appointment. Offered to increase Prozac to 40 mg daily, patient opted to wait for another week before increasing given only 1 week since last increase.   Recommended that she  continue to follow up with scheduling an appointment with psychiatry. Scheduled appointment for Dec 28 at 1140 am She will MyChart me next Wed, 12/13 to update me and we can discuss increasing dose.    Carollee Leitz, MD

## 2022-11-19 NOTE — Telephone Encounter (Signed)
I would recommend she take that appointment as I don't have anything open right now.  If there is a cancellation can call her to see if she can come in. Can be virtual or office

## 2022-11-20 ENCOUNTER — Ambulatory Visit: Payer: Managed Care, Other (non HMO) | Admitting: Family Medicine

## 2022-11-23 ENCOUNTER — Encounter: Payer: Self-pay | Admitting: Family Medicine

## 2022-11-23 NOTE — Assessment & Plan Note (Signed)
Continue to try and obtain [facility].  Has been approved by insurance but unfortunately it is remains on backorder Continue diet and exercise

## 2022-11-23 NOTE — Assessment & Plan Note (Signed)
Check levels today Consider vitamin B12 sublingual if not wanting to continue injections. Will discuss at next visit.

## 2022-11-23 NOTE — Assessment & Plan Note (Signed)
Significantly improved since initiation of fluoxetine.  Has not used BuSpar.  Last dose of Zoloft will be in 3 days. Discontinue Zoloft. Continue fluoxetine 20 mg daily, can increase to 30 mg in 3 to 4 days if increasing signs of anxiety. Encourage CBT Follow-up in 1 week.

## 2022-11-23 NOTE — Assessment & Plan Note (Signed)
Resolved

## 2022-11-25 ENCOUNTER — Telehealth: Payer: Self-pay | Admitting: Family Medicine

## 2022-11-25 ENCOUNTER — Other Ambulatory Visit: Payer: Managed Care, Other (non HMO)

## 2022-11-25 NOTE — Telephone Encounter (Signed)
Patient would like to do her labs at [facility], she can have them done free there. Please change lab orders.

## 2022-11-27 ENCOUNTER — Other Ambulatory Visit: Payer: Self-pay

## 2022-11-27 NOTE — Telephone Encounter (Signed)
Please reoorder labs

## 2022-11-27 NOTE — Telephone Encounter (Signed)
Changed lab orders and printed and faxed the orders to [facility] per Patient request.

## 2022-11-27 NOTE — Addendum Note (Signed)
Addended by: Leeanne Rio on: 11/27/2022 09:28 AM   Modules accepted: Orders

## 2022-11-29 ENCOUNTER — Ambulatory Visit
Admission: RE | Admit: 2022-11-29 | Discharge: 2022-11-29 | Disposition: A | Discharge status: Home / Self Care | Payer: Managed Care, Other (non HMO) | Source: Ambulatory Visit | Attending: Family Medicine | Admitting: Family Medicine

## 2022-11-29 DIAGNOSIS — Z1231 Encounter for screening mammogram for malignant neoplasm of breast: Secondary | ICD-10-CM | POA: Diagnosis present

## 2022-11-30 ENCOUNTER — Other Ambulatory Visit: Payer: Self-pay | Admitting: Family Medicine

## 2022-11-30 DIAGNOSIS — E559 Vitamin D deficiency, unspecified: Secondary | ICD-10-CM

## 2022-11-30 LAB — VITAMIN D 25 HYDROXY (VIT D DEFICIENCY, FRACTURES): Vit D, 25-Hydroxy: 23.3 ng/mL — ABNORMAL LOW (ref 30.0–100.0)

## 2022-11-30 LAB — VITAMIN B12: Vitamin B-12: 299 pg/mL (ref 232–1245)

## 2022-11-30 MED ORDER — VITAMIN D (ERGOCALCIFEROL) 1.25 MG (50000 UNIT) PO CAPS: 50000.0000 [IU] | ORAL_CAPSULE | ORAL | 0 refills | Status: DC

## 2022-12-12 ENCOUNTER — Ambulatory Visit: Payer: Managed Care, Other (non HMO) | Admitting: Family Medicine

## 2022-12-12 ENCOUNTER — Encounter: Payer: Self-pay | Admitting: Family Medicine

## 2022-12-12 VITALS — BP 124/78 | HR 70 | Temp 98.3°F | Ht 66.0 in | Wt 233.0 lb

## 2022-12-12 DIAGNOSIS — E669 Obesity, unspecified: Secondary | ICD-10-CM

## 2022-12-12 DIAGNOSIS — E538 Deficiency of other specified B group vitamins: Secondary | ICD-10-CM | POA: Diagnosis not present

## 2022-12-12 DIAGNOSIS — F39 Unspecified mood [affective] disorder: Secondary | ICD-10-CM

## 2022-12-12 MED ORDER — VITAMIN B-12 1000 MCG SL SUBL: 1000.0000 ug | SUBLINGUAL_TABLET | Freq: Every day | SUBLINGUAL | 3 refills | Status: DC

## 2022-12-12 MED ORDER — FLUOXETINE HCL 10 MG PO TABS: 30.0000 mg | ORAL_TABLET | Freq: Every day | ORAL | 1 refills | Status: DC

## 2022-12-12 MED ORDER — ZEPBOUND 2.5 MG/0.5ML ~~LOC~~ SOAJ: 2.5000 mg | SUBCUTANEOUS | 0 refills | Status: DC

## 2022-12-12 NOTE — Patient Instructions (Signed)
It was a pleasure meeting you today. Thank you for allowing me to take part in your health care.  Our goals for today as we discussed include:  Continue Prozac 30 mg daily.  Take  three 10 mg capsules. Follow-up with Dr. Nicolasa Ducking as scheduled  Start Zepbound 2.5 mg weekly for 4 weeks.  Start vitamin B12 sublingual 1000 mcg daily.  If you have any questions or concerns, please do not hesitate to call the office at [phone].  I look forward to our next visit and until then take care and stay safe.  Regards,   Carollee Leitz, MD   [facility]

## 2022-12-12 NOTE — Progress Notes (Signed)
SUBJECTIVE:   Chief Complaint  Patient presents with   Follow-up    Anxiety    HPI Presents for follow up for anxiety. Seen in clinic on 11/22 and Prozac was increased to 30 mg, Zoloft and BuSpar discontinued.  Since then patient reports minimal improvement in symptoms.  She has been able to follow-up with Dr. Nicolasa Ducking, psychiatry.  Currently awaiting [facility] DNA profile.  Most recently prescribed citalopram 20 mg and lurasidone 40 mg.  Patient has not taken citalopram did try Latuda for a few days which did not help.  She reports that she has not currently taking either medication.  Associated symptoms include restless and feeling scatter brained.  Has multiple tasks to complete.  Reports not wanting to increase or change medications until results from [facility] return.  She plans to send this today.   Vitamin B 12 History of bariatric surgery.  Was previously taking vitamin B-12 1000 mcg IM every 14 days.  Has not been on injections for quite some time.  Recent B12 low normal.  Willing to start vitamin B12 sublingual.  Obesity class II Unable to obtain [facility] given backorder from pharmacy.  Patient is still interested in weight loss medication.  Has history of bariatric surgery in the past.  Has previously tried metformin, Wegovy, Saxenda and phentermine without success.  No thyroid cancer history or family history of thyroid cancer.  No history of pancreatitis.  PERTINENT PMH / PSH: Hypertension Obesity class II Mood disorder   OBJECTIVE:  BP 124/78   Pulse 70   Temp 98.3 F (36.8 C)   Ht '5\' 6"'$  (1.676 m)   Wt 233 lb (105.7 kg)   LMP 02/15/2019   SpO2 98%   BMI 37.61 kg/m    Physical Exam Vitals reviewed.  Constitutional:      General: She is not in acute distress.    Appearance: Normal appearance. She is normal weight. She is not ill-appearing, toxic-appearing or diaphoretic.  Eyes:     General:        Right eye: No discharge.        Left eye: No discharge.      Conjunctiva/sclera: Conjunctivae normal.  Cardiovascular:     Rate and Rhythm: Normal rate and regular rhythm.     Heart sounds: Normal heart sounds.  Pulmonary:     Effort: Pulmonary effort is normal.     Breath sounds: Normal breath sounds.  Musculoskeletal:        General: Normal range of motion.  Skin:    General: Skin is warm and dry.  Neurological:     General: No focal deficit present.     Mental Status: She is alert and oriented to person, place, and time. Mental status is at baseline.  Psychiatric:        Mood and Affect: Mood normal.        Behavior: Behavior normal.        Thought Content: Thought content normal.        Judgment: Judgment normal.     ASSESSMENT/PLAN:  Mood disorder ([place]) Assessment & Plan: Significantly improved since initiation of fluoxetine.  Minimal improvement since increasing fluoxetine from 20 mg to 30 mg.  Has recently seen psychiatry who recommended Latuda and citalopram and discontinuing Prozac.  Patient wants to remain on Prozac until genetic testing performed.  Does not want to fluctuate with different changes in medication.   Continue fluoxetine 30 mg daily Continue CBT Follow-up with psychiatry as scheduled in  January. Strict return precautions provided.  Orders: -     FLUoxetine HCl; Take 3 tablets (30 mg total) by mouth daily.  Dispense: 180 tablet; Refill: 1  Vitamin B 12 deficiency Assessment & Plan: Chronic.  History of gastric bypass.  Recent B12 levels low normal. Start vitamin B12 1000 mg sublingual Will repeat level in 3 months.  Orders: -     Vitamin B-12; Place 1 tablet (1,000 mcg total) under the tongue daily.  Dispense: 90 tablet; Refill: 3  Obesity (BMI 30-39.9) Assessment & Plan: Unable to obtain [facility].  BMI remains elevated.  Tried multiple medications in the past with no success.  Has had gastric bypass surgery. Discontinue Wegovy Start Tirzepatide for weight loss  Orders: -     Zepbound; Inject 2.5 mg into  the skin once a week.  Dispense: 2 mL; Refill: 0   PDMP reviewed  Return in about 3 months (around 03/13/2023) for PCP.  Carollee Leitz, MD

## 2022-12-12 NOTE — Assessment & Plan Note (Signed)
Chronic.  History of gastric bypass.  Recent B12 levels low normal. Start vitamin B12 1000 mg sublingual Will repeat level in 3 months.

## 2022-12-12 NOTE — Assessment & Plan Note (Addendum)
Unable to obtain [facility].  BMI remains elevated.  Tried multiple medications in the past with no success.  Has had gastric bypass surgery. Discontinue Wegovy Start Tirzepatide for weight loss

## 2022-12-12 NOTE — Assessment & Plan Note (Signed)
Significantly improved since initiation of fluoxetine.  Minimal improvement since increasing fluoxetine from 20 mg to 30 mg.  Has recently seen psychiatry who recommended Latuda and citalopram and discontinuing Prozac.  Patient wants to remain on Prozac until genetic testing performed.  Does not want to fluctuate with different changes in medication.   Continue fluoxetine 30 mg daily Continue CBT Follow-up with psychiatry as scheduled in January. Strict return precautions provided.

## 2023-01-14 ENCOUNTER — Encounter: Payer: Self-pay | Admitting: Family Medicine

## 2023-01-14 NOTE — Telephone Encounter (Signed)
Pt unhappy with Dr Waylan Boga care Wants to discuss with Dr Volanda Napoleon Appt booked for Thursday 2/1 at 1140am

## 2023-01-16 ENCOUNTER — Encounter: Payer: Self-pay | Admitting: Family Medicine

## 2023-01-16 ENCOUNTER — Ambulatory Visit: Payer: Managed Care, Other (non HMO) | Admitting: Family Medicine

## 2023-01-16 VITALS — BP 130/84 | HR 64 | Temp 97.8°F | Ht 66.0 in | Wt 242.2 lb

## 2023-01-16 DIAGNOSIS — E559 Vitamin D deficiency, unspecified: Secondary | ICD-10-CM | POA: Diagnosis not present

## 2023-01-16 DIAGNOSIS — F39 Unspecified mood [affective] disorder: Secondary | ICD-10-CM

## 2023-01-16 DIAGNOSIS — E538 Deficiency of other specified B group vitamins: Secondary | ICD-10-CM | POA: Diagnosis not present

## 2023-01-16 LAB — VITAMIN B12: Vitamin B-12: 190 pg/mL — ABNORMAL LOW (ref 211–911)

## 2023-01-16 LAB — VITAMIN D 25 HYDROXY (VIT D DEFICIENCY, FRACTURES): VITD: 39.01 ng/mL (ref 30.00–100.00)

## 2023-01-16 NOTE — Patient Instructions (Addendum)
It was a pleasure meeting you today. Thank you for allowing me to take part in your health care.  Our goals for today as we discussed include:  We will get some labs today.  If they are abnormal or we need to do something about them, I will call you.  If they are normal, I will send you a message on MyChart (if it is active) or a letter in the mail.  If you don't hear from [place] in 2 weeks, please call the office at the number below.   Follow up in 2 weeks  If you have any questions or concerns, please do not hesitate to call the office at [phone].  I look forward to our next visit and until then take care and stay safe.  Regards,   Carollee Leitz, MD   [facility]

## 2023-01-16 NOTE — Progress Notes (Signed)
SUBJECTIVE:   Chief Complaint  Patient presents with   Anxiety   HPI Patient presents to clinic to discuss anxiety.  She was evaluated by psychiatry prior to Christmas and Prozac was discontinued. Patient was started on Celexa and [place].  She reports that she did not take the [place].  Her follow up visit with psychiatry did not go well.  Patient reports that Celexa was increased and reports provider unhappy about not starting Latuda.  She was asked about eating habits and noted that if she eats too much she will sometimes feel nauseous.  When this happens, sometimes once a week, she will force herself to vomit.  She was upset that psychiatry note reflected that she had an eating disorder and wanted labs completed.  She indicated that she does not self vomit several times a weeks as was documented.  Her weight has increased since last visit.  She reports that currently she is ok to stay on the Celexa at 40 mg daily.    For her elevated blood pressure patient reports that the only time it is high is when sh goes to psychiatry office.  Thinks there may be an issue with the BP machine as there does not seem to be an issue when seeing other providers.  PERTINENT PMH / PSH: Mood Disorder Migraines GERD Obesity class 2 IIH Vitamin B 12 deficiency  OBJECTIVE:  BP 130/84   Pulse 64   Temp 97.8 F (36.6 C)   Ht '5\' 6"'$  (1.676 m)   Wt 242 lb 3.2 oz (109.9 kg)   LMP 02/15/2019   SpO2 98%   BMI 39.09 kg/m    Physical Exam Psychiatric:        Attention and Perception: Attention normal.        Mood and Affect: Mood is anxious.        Speech: Speech normal.        Behavior: Behavior normal.        Thought Content: Thought content does not include suicidal ideation. Thought content does not include suicidal plan.        Cognition and Memory: Cognition and memory normal.       01/16/2023   11:22 AM 12/12/2022   11:33 AM 11/06/2022    3:29 PM 10/31/2022    4:24 PM 10/21/2022    9:49 AM   Depression screen PHQ 2/9  Decreased Interest '1 1 1 1 2  '$ Down, Depressed, Hopeless '1 1 1 1 2  '$ PHQ - 2 Score '2 2 2 2 4  '$ Altered sleeping '3 2 2 3 2  '$ Tired, decreased energy '3 2 1 2 3  '$ Change in appetite 2 0 '2 3 3  '$ Feeling bad or failure about yourself  1 0 0 1 2  Trouble concentrating '3 2 2 3 3  '$ Moving slowly or fidgety/restless '2 1 2 2 2  '$ Suicidal thoughts 0 0 0 0   PHQ-9 Score '16 9 11 16 19  '$ Difficult doing work/chores Somewhat difficult Somewhat difficult  Somewhat difficult Somewhat difficult        01/16/2023   11:22 AM 12/12/2022   11:34 AM 11/06/2022    3:30 PM 10/31/2022    4:24 PM  GAD 7 : Generalized Anxiety Score  Nervous, Anxious, on Edge '1 2 2 2  '$ Control/stop worrying '1 1 1 2  '$ Worry too much - different things 1 0 1 2  Trouble relaxing '2 2 2 2  '$ Restless '2 2 1 '$ 2  Easily annoyed or irritable '1 2 2 2  '$ Afraid - awful might happen 1 0 0 1  Total GAD 7 Score '9 9 9 13  '$ Anxiety Difficulty Somewhat difficult Somewhat difficult  Somewhat difficult     ASSESSMENT/PLAN:  Mood disorder ([facility]) Assessment & Plan: Chronic. Stable. PHQ9 has increased since switching from Prozac to Celexa.  CAD 7 remains unchanged. Discussed with patient that may need mood stabilizer if no improvement, likely why psychiatry initiated [place]. Continue Celexa 40 mg  daily Considering EMDR therapy Following with psychiatry     Vitamin B 12 deficiency Assessment & Plan: Chronic.  History of gastric bypass.   Taking vitamin B12 1000 mg sublingual Check levels today, if low can increase to 5000 mg daily or can revert back to IM.  Orders: -     Vitamin B12  Vitamin D deficiency -     VITAMIN D 25 Hydroxy (Vit-D Deficiency, Fractures)  Concern for Elevated BP BP range in clinic  110-132/70-84 No indication for antihypertensive treatment  Eating disorder Recent labs from 09/23 and previously, show no electrolyte imbalance or protein deficiency. Hbg/Hct and MCV normal.    PDMP  reviewed  Return in about 2 weeks (around 01/30/2023) for PCP.  Carollee Leitz, MD

## 2023-01-18 ENCOUNTER — Encounter: Payer: Self-pay | Admitting: Family Medicine

## 2023-01-18 NOTE — Assessment & Plan Note (Signed)
Chronic. Stable. PHQ9 has increased since switching from Prozac to Celexa.  CAD 7 remains unchanged. Discussed with patient that may need mood stabilizer if no improvement, likely why psychiatry initiated [place]. Continue Celexa 40 mg  daily Considering EMDR therapy Following with psychiatry

## 2023-01-18 NOTE — Assessment & Plan Note (Signed)
Chronic.  History of gastric bypass.   Taking vitamin B12 1000 mg sublingual Check levels today, if low can increase to 5000 mg daily or can revert back to IM.

## 2023-01-21 ENCOUNTER — Encounter: Payer: Self-pay | Admitting: Family Medicine

## 2023-01-21 ENCOUNTER — Encounter: Payer: Managed Care, Other (non HMO) | Admitting: Internal Medicine

## 2023-01-21 ENCOUNTER — Ambulatory Visit (INDEPENDENT_AMBULATORY_CARE_PROVIDER_SITE_OTHER): Payer: Managed Care, Other (non HMO) | Admitting: Family Medicine

## 2023-01-21 VITALS — BP 124/72 | HR 68 | Temp 97.7°F | Ht 66.0 in | Wt 242.8 lb

## 2023-01-21 DIAGNOSIS — E559 Vitamin D deficiency, unspecified: Secondary | ICD-10-CM

## 2023-01-21 DIAGNOSIS — R7303 Prediabetes: Secondary | ICD-10-CM

## 2023-01-21 DIAGNOSIS — Z0000 Encounter for general adult medical examination without abnormal findings: Secondary | ICD-10-CM

## 2023-01-21 DIAGNOSIS — F39 Unspecified mood [affective] disorder: Secondary | ICD-10-CM | POA: Diagnosis not present

## 2023-01-21 DIAGNOSIS — F988 Other specified behavioral and emotional disorders with onset usually occurring in childhood and adolescence: Secondary | ICD-10-CM

## 2023-01-21 DIAGNOSIS — E669 Obesity, unspecified: Secondary | ICD-10-CM

## 2023-01-21 DIAGNOSIS — E538 Deficiency of other specified B group vitamins: Secondary | ICD-10-CM

## 2023-01-21 MED ORDER — VITAMIN B-12 1000 MCG SL SUBL: 5000.0000 ug | SUBLINGUAL_TABLET | Freq: Every day | SUBLINGUAL | 3 refills | Status: DC

## 2023-01-21 NOTE — Progress Notes (Signed)
SUBJECTIVE:   Chief Complaint  Patient presents with   Annual Exam   HPI Patient presents for annual physical.  Vitamin B12 deficiency History of gastric bypass.  Currently on vitamin B12 1000 mg daily sublingual.  Would like to remain off injections if possible.  Financial constraints returning to clinic biweekly for IM injections.  Has been previously giving IM injections in her thigh.  B12 levels remained low.  Mood disorder Seen psychiatry.  Currently on Celexa 40 mg and reports doing okay.  Is concerned about not being on max dose of medication and not wanting to have multiple medication adjustments.  Wondering why a mood stabilizer had been used initially in conjunction with citalopram.  Reports had tried [place] for 1 week but did not feel it was doing anything so self discontinued.  Feels like citalopram is sort of helping however she still has a component of edginess and fidgeting that she thinks may be attributed to possible ADHD.  She reports that psychiatry made reference to referring for ADHD evaluation however has not heard about a scheduled appointment.  Endorses possible EMDR therapy with therapist Lady Deutscher.  Patient reports that seeing multiple providers for therapy and medication management is becoming a financial burden.    PERTINENT PMH / PSH: Mood disorder Obesity class II Vitamin B12 deficiency status post gastric sleeve resection Vitamin D deficiency  OBJECTIVE:  BP 124/72   Pulse 68   Temp 97.7 F (36.5 C)   Ht 5' 6"$  (1.676 m)   Wt 242 lb 12.8 oz (110.1 kg)   LMP 02/15/2019   SpO2 99%   BMI 39.19 kg/m    Physical Exam Vitals reviewed.  Constitutional:      General: She is not in acute distress.    Appearance: She is normal weight. She is not ill-appearing.  HENT:     Head: Normocephalic.  Eyes:     Conjunctiva/sclera: Conjunctivae normal.  Cardiovascular:     Rate and Rhythm: Normal rate and regular rhythm.     Pulses: Normal pulses.   Pulmonary:     Effort: Pulmonary effort is normal.     Breath sounds: Normal breath sounds.  Abdominal:     General: Bowel sounds are normal.  Neurological:     Mental Status: She is alert. Mental status is at baseline.  Psychiatric:        Mood and Affect: Mood normal.        Behavior: Behavior normal.        Thought Content: Thought content normal.        Judgment: Judgment normal.       01/21/2023    8:50 AM 01/16/2023   11:22 AM 12/12/2022   11:33 AM 11/06/2022    3:29 PM 10/31/2022    4:24 PM  Depression screen PHQ 2/9  Decreased Interest 1 1 1 1 1  $ Down, Depressed, Hopeless 1 1 1 1 1  $ PHQ - 2 Score 2 2 2 2 2  $ Altered sleeping 3 3 2 2 3  $ Tired, decreased energy 2 3 2 1 2  $ Change in appetite 2 2 0 2 3  Feeling bad or failure about yourself  1 1 0 0 1  Trouble concentrating 3 3 2 2 3  $ Moving slowly or fidgety/restless 2 2 1 2 2  $ Suicidal thoughts 0 0 0 0 0  PHQ-9 Score 15 16 9 11 16  $ Difficult doing work/chores Somewhat difficult Somewhat difficult Somewhat difficult  Somewhat difficult  01/21/2023    8:51 AM 01/16/2023   11:22 AM 12/12/2022   11:34 AM 11/06/2022    3:30 PM  GAD 7 : Generalized Anxiety Score  Nervous, Anxious, on Edge 1 1 2 2  $ Control/stop worrying 1 1 1 1  $ Worry too much - different things 1 1 0 1  Trouble relaxing 1 2 2 2  $ Restless 2 2 2 1  $ Easily annoyed or irritable 2 1 2 2  $ Afraid - awful might happen 1 1 0 0  Total GAD 7 Score 9 9 9 9  $ Anxiety Difficulty Somewhat difficult Somewhat difficult Somewhat difficult       ASSESSMENT/PLAN:  Annual physical exam  Mood disorder [facility]) Assessment & Plan: Chronic. Stable.  No significant change in PHQ-9 or GAD score since last visit. Referred for ADHD evaluation Continue Celexa 40 mg daily Considering EMDR therapy Following with psychiatry    Orders: -     Magnesium; Future -     Phosphorus; Future  Vitamin B 12 deficiency Assessment & Plan: Chronic.  Likely secondary to gastric  bypass.  Hemoglobin, MCV all within normal limits.  Currently on vitamin B12 1000 mcg daily Recent B12 level 192.  Decreased from previous.  However even with biweekly injectable remains low. Patient prefers to remain off injectables if possible. Increase sublingual vitamin B12 to 5000 mcg daily Repeat vitamin B12 levels in 1 month, if remains low plan to resume injections. Plan to check methylmalonic acid at that time.  Orders: -     Vitamin B-12; Place 5 tablets (5,000 mcg total) under the tongue daily.  Dispense: 90 tablet; Refill: 3 -     Vitamin B12; Future -     Methylmalonic acid, serum; Future  Obesity (BMI 30-39.9) -     Comprehensive metabolic panel; Future -     Lipid panel; Future  Attention deficit disorder, unspecified hyperactivity presence Assessment & Plan: Has not been officially diagnosed.  Will send referral for evaluation.  Orders: -     Ambulatory referral to Psychiatry  Prediabetes -     Hemoglobin A1c; Future  Vitamin D deficiency -     Vitamin D3; Take 1 capsule (1,000 Units total) by mouth daily.  Dispense: 90 capsule; Refill: 3  HCM Pap discontinued secondary to [facility] HIV/hepatitis C screening completed Mammogram up-to-date.  Due 2024 Declined flu vaccine Tdap up-to-date.  Due 2027  PDMP reviewed  Return if symptoms worsen or fail to improve, for Labs in 4 weeks.  Carollee Leitz, MD

## 2023-01-21 NOTE — Patient Instructions (Addendum)
It was a pleasure meeting you today. Thank you for allowing me to take part in your health care.  Our goals for today as we discussed include:  Increase Vitamin B 12 to 5000 mcg daily Repeat blood work in 4 weeks  Can check to see if Dr Shea Evans is taking new patients  For [place] [phone] Address: [place], [place] [address] Hours: Open 24/7, No appointment required.     Will send referral for ADHD evaluation  [facility] Address: [place], [place],  [address] Hours:  Open ? Closes 5?PM Phone: [phone]  No labs needed today   If you have any questions or concerns, please do not hesitate to call the office at [phone].  I look forward to our next visit and until then take care and stay safe.  Regards,   Carollee Leitz, MD   [facility]

## 2023-01-24 ENCOUNTER — Encounter: Payer: Self-pay | Admitting: Family Medicine

## 2023-01-24 DIAGNOSIS — F988 Other specified behavioral and emotional disorders with onset usually occurring in childhood and adolescence: Secondary | ICD-10-CM | POA: Insufficient documentation

## 2023-01-24 MED ORDER — VITAMIN D3 25 MCG (1000 UT) PO CAPS: 1000.0000 [IU] | ORAL_CAPSULE | Freq: Every day | ORAL | 3 refills | Status: AC

## 2023-01-24 NOTE — Assessment & Plan Note (Signed)
Chronic. Stable.  No significant change in PHQ-9 or GAD score since last visit. Referred for ADHD evaluation Continue Celexa 40 mg daily Considering EMDR therapy Following with psychiatry

## 2023-01-24 NOTE — Assessment & Plan Note (Signed)
Has not been officially diagnosed.  Will send referral for evaluation.

## 2023-01-24 NOTE — Assessment & Plan Note (Addendum)
Chronic.  Likely secondary to gastric bypass.  Hemoglobin, MCV all within normal limits.  Currently on vitamin B12 1000 mcg daily Recent B12 level 192.  Decreased from previous.  However even with biweekly injectable remains low. Patient prefers to remain off injectables if possible. Increase sublingual vitamin B12 to 5000 mcg daily Repeat vitamin B12 levels in 1 month, if remains low plan to resume injections. Plan to check methylmalonic acid at that time.

## 2023-01-31 ENCOUNTER — Telehealth: Payer: Self-pay | Admitting: Family Medicine

## 2023-01-31 NOTE — Telephone Encounter (Signed)
Pt called in staying that she doesn't know why she has an appt with Dr.Walsh on Monday?? What's the F/U for??

## 2023-02-01 NOTE — Progress Notes (Deleted)
err

## 2023-02-03 ENCOUNTER — Ambulatory Visit: Payer: Managed Care, Other (non HMO) | Admitting: Family Medicine

## 2023-02-03 NOTE — Telephone Encounter (Signed)
I spoke with the patient on mychart and she cancelled today's appointment and she is scheduled for 04/01/2023 with the provider and a lab appointment 1 weeks before.  Graylen Noboa,cma

## 2023-02-03 NOTE — Telephone Encounter (Signed)
Lm for pt to cb  

## 2023-02-18 ENCOUNTER — Other Ambulatory Visit: Payer: Managed Care, Other (non HMO)

## 2023-03-03 ENCOUNTER — Encounter: Payer: Self-pay | Admitting: Family Medicine

## 2023-03-12 ENCOUNTER — Other Ambulatory Visit: Payer: Self-pay

## 2023-03-12 ENCOUNTER — Encounter: Payer: Self-pay | Admitting: *Deleted

## 2023-03-12 ENCOUNTER — Emergency Department
Admission: EM | Admit: 2023-03-12 | Discharge: 2023-03-13 | Disposition: A | Discharge status: Home / Self Care | Payer: Managed Care, Other (non HMO) | Attending: Emergency Medicine | Admitting: Emergency Medicine

## 2023-03-12 DIAGNOSIS — I158 Other secondary hypertension: Secondary | ICD-10-CM | POA: Insufficient documentation

## 2023-03-12 DIAGNOSIS — R519 Headache, unspecified: Secondary | ICD-10-CM | POA: Diagnosis not present

## 2023-03-12 NOTE — ED Triage Notes (Signed)
Pt ambulatory to triage.  Pt reports a headache, nausea, and hypertension.  No meds for htn.  Pt alert  speech clear  sx began at 1400 today.

## 2023-03-12 NOTE — ED Provider Notes (Addendum)
[facility] Provider Note    Event Date/Time   First MD Initiated Contact with Patient 03/12/23 2344     (approximate)   History   Headache   HPI  Debbie Townsend is a 37 y.o. female past medical history significant for migraine headaches who presents to the emergency department with a headache and high blood pressure.  Around 2:00 today developed a headache that progressively worsened and had peak headache around 9 PM.  Checked her blood pressure starting around 6 PM and started having an elevated blood pressure of 150 and then elevated up to [id number] systolic.  Ongoing headache at this time which now feels similar to her migraine headache.  Took multiple antimigraine medications at home prior to arrival.  Denies any change in vision, slurring of speech, extremity numbness or weakness.  Denies any chest pain or shortness of breath.  Denies any falls or trauma.  Denies any alcohol or marijuana use.     Physical Exam   Triage Vital Signs: ED Triage Vitals  Enc Vitals Group     BP 03/12/23 2222 (!) 146/104     Pulse Rate 03/12/23 2222 63     Resp 03/12/23 2222 18     Temp 03/12/23 2222 98.2 F (36.8 C)     Temp Source 03/12/23 2222 Oral     SpO2 03/12/23 2222 99 %     Weight 03/12/23 2220 240 lb (108.9 kg)     Height 03/12/23 2220 5\' 7"  (1.702 m)     Head Circumference --      Peak Flow --      Pain Score 03/12/23 2220 10     Pain Loc --      Pain Edu? --      Excl. in [place]? --     Most recent vital signs: Vitals:   03/12/23 2222 03/12/23 2341  BP: (!) 146/104 (!) 150/96  Pulse: 63 70  Resp: 18 18  Temp: 98.2 F (36.8 C)   SpO2: 99%     Physical Exam Constitutional:      Appearance: She is well-developed.  HENT:     Head: Atraumatic.  Eyes:     Conjunctiva/sclera: Conjunctivae normal.  Cardiovascular:     Rate and Rhythm: Regular rhythm.     Heart sounds: No murmur heard. Pulmonary:     Effort: No respiratory distress.  Abdominal:      General: There is no distension.  Musculoskeletal:        General: Normal range of motion.     Cervical back: Normal range of motion.  Skin:    General: Skin is warm.  Neurological:     Mental Status: She is alert. Mental status is at baseline.     GCS: GCS eye subscore is 4. GCS verbal subscore is 5. GCS motor subscore is 6.     Cranial Nerves: Cranial nerves 2-12 are intact.     Sensory: Sensation is intact.     Motor: Motor function is intact.     Coordination: Coordination is intact.     Gait: Gait is intact.     IMPRESSION / MDM / ASSESSMENT AND PLAN / ED COURSE  I reviewed the triage vital signs and the nursing notes.  Differential diagnosis including migraine headache, hypertension, intracranial hemorrhage, CKD, electrolyte abnormality.  Peak headache within 6 hours of CT scan of the head, have a low suspicion for subarachnoid hemorrhage, no thunderclap headache.  Low suspicion for dissection,  no tearing chest pain, normal neurologic exam with no deficits.  No dizziness.  Have a low suspicion for posterior circulation CVA.  No change in vision, pupils are equal and reactive, clinical picture is not consistent with glaucoma.  RADIOLOGY I independently reviewed imaging, my interpretation of imaging: CT head without signs of intracranial hemorrhage.  Read as no acute findings.  LABS (all labs ordered are listed, but only abnormal results are displayed) Labs interpreted as -    Labs Reviewed - No data to display    MDM   Clinical Course as of 03/13/23 0123  Thu Mar 13, 2023  0107 Patient told the nurse that if her CT scan of her head was normal she did not want any lab work.  She does not want an IV.  She does not want an EKG.  Explained wanting to evaluate her kidney function, electrolyte abnormalities and an EKG for any findings of dysrhythmia, ischemia or abnormalities.  She expressed understanding and declined any further workup. [SM]    Clinical Course User  Index [SM] Nathaniel Man, MD   On reevaluation patient states that her headache is feeling much better.  Low suspicion for subarachnoid hemorrhage.  Clinical picture is not consistent with meningitis.  Discussed close follow-up with her primary care physician and given return precautions for any worsening symptoms.  PROCEDURES:  Critical Care performed: No  Procedures  Patient's presentation is most consistent with acute presentation with potential threat to life or bodily function.   MEDICATIONS ORDERED IN ED: Medications  ketorolac (TORADOL) 30 MG/ML injection 30 mg (30 mg Intramuscular Given 03/13/23 0103)  droperidol (INAPSINE) 2.5 MG/ML injection 2.5 mg (2.5 mg Intramuscular Given 03/13/23 0103)    FINAL CLINICAL IMPRESSION(S) / ED DIAGNOSES   Final diagnoses:  Headache disorder  Other secondary hypertension     Rx / DC Orders   ED Discharge Orders     None        Note:  This document was prepared using Dragon voice recognition software and may include unintentional dictation errors.   Nathaniel Man, MD 03/13/23 [place]    Nathaniel Man, MD 03/13/23 [phone]

## 2023-03-13 ENCOUNTER — Emergency Department: Payer: Managed Care, Other (non HMO)

## 2023-03-13 MED ORDER — DROPERIDOL 2.5 MG/ML IJ SOLN
2.5000 mg | Freq: Once | INTRAMUSCULAR | Status: AC
  Administered 2023-03-13: 2.5 mg via INTRAMUSCULAR
  Filled 2023-03-13: qty 2

## 2023-03-13 MED ORDER — KETOROLAC TROMETHAMINE 30 MG/ML IJ SOLN
30.0000 mg | Freq: Once | INTRAMUSCULAR | Status: AC
  Administered 2023-03-13: 30 mg via INTRAMUSCULAR
  Filled 2023-03-13: qty 1

## 2023-03-13 NOTE — Discharge Instructions (Addendum)
You were seen in the emergency department for a headache and elevated blood pressure.  You had a CT scan done of your head that was normal.  We wanted to get lab work, however you declined at this time.  Follow-up closely with your primary care physician so they can recheck your blood pressure and any further lab work.  Return to the emergency department for any return of symptoms or worsening symptoms.

## 2023-03-13 NOTE — ED Notes (Signed)
Patient wanted to be discharged with only medication after finding out CT result was favorable. Medication was given, no new vitals were obtained.  Patient was able to walk out with even and steady gait, respirations were even and unlabored.  No distress noted on discharge.

## 2023-03-17 ENCOUNTER — Telehealth: Payer: Self-pay

## 2023-03-17 NOTE — Transitions of Care (Post Inpatient/ED Visit) (Signed)
   03/17/2023  Name: ESM… FLAMMER MRN: [id number] DOB: 05-28-1986  Pt declined follow up appointment.  She is scheduled to see Dr. Volanda Napoleon 04/01/23.  She reports that now that she does not have a migraine that her BP is normal.  Did not have the reading with her during the time of the call but has been taking it at home.    Today's TOC FU Call Status: Today's TOC FU Call Status:: Successful TOC FU Call Competed TOC FU Call Complete Date: 03/17/23  Transition Care Management Follow-up Telephone Call Date of Discharge: 03/12/26 Discharge Facility: [facility]) Type of Discharge: Emergency Department How have you been since you were released from the hospital?: Better Any questions or concerns?: No  Items Reviewed: Did you receive and understand the discharge instructions provided?: Yes Medications obtained and verified?: Yes (Medications Reviewed) (No new medications) Any new allergies since your discharge?: No Dietary orders reviewed?: NA Do you have support at home?: Yes People in Home: spouse Name of Support/Comfort Primary Source: [facility] and Equipment/Supplies: [place] Ordered?: No Any new equipment or medical supplies ordered?: No  Functional Questionnaire: Do you need assistance with bathing/showering or dressing?: No Do you need assistance with meal preparation?: No Do you need assistance with eating?: No Do you have difficulty maintaining continence: No Do you need assistance with getting out of bed/getting out of a chair/moving?: No Do you have difficulty managing or taking your medications?: No  Follow up appointments reviewed: PCP Follow-up appointment confirmed?: NA (Pt declined follow appointment.) [facility] Follow-up appointment confirmed?: NA Do you need transportation to your follow-up appointment?: No Do you understand care options if your condition(s) worsen?: Yes-patient verbalized  understanding    SIGNATURE Ferne Reus, RN

## 2023-03-20 LAB — BASIC METABOLIC PANEL
BUN: 11 (ref 4–21)
CO2: 21 (ref 13–22)
Chloride: 99 (ref 99–108)
Creatinine: 0.8 (ref 0.5–1.1)
Glucose: 74
Potassium: 4.6 mEq/L (ref 3.5–5.1)
Sodium: 138 (ref 137–147)

## 2023-03-20 LAB — HEPATIC FUNCTION PANEL
ALT: 16 U/L (ref 7–35)
AST: 17 (ref 13–35)
Alkaline Phosphatase: 54 (ref 25–125)
Bilirubin, Total: 0.6

## 2023-03-20 LAB — CBC: RBC: 5.14 — AB (ref 3.87–5.11)

## 2023-03-21 ENCOUNTER — Encounter: Payer: Self-pay | Admitting: Family Medicine

## 2023-03-21 LAB — CBC AND DIFFERENTIAL
HCT: 43 (ref 36–46)
Hemoglobin: 14.5 (ref 12.0–16.0)
Neutrophils Absolute: 5.2
Platelets: 298 10*3/uL (ref 150–400)
WBC: 7.3

## 2023-03-21 LAB — COMPREHENSIVE METABOLIC PANEL
Albumin: 4.2 (ref 3.5–5.0)
Calcium: 9.1 (ref 8.7–10.7)
Globulin: 2.3
eGFR: 100

## 2023-03-21 LAB — TSH: TSH: 2.15 (ref 0.41–5.90)

## 2023-03-21 LAB — VITAMIN B12: Vitamin B-12: 256

## 2023-03-27 ENCOUNTER — Ambulatory Visit: Payer: Managed Care, Other (non HMO) | Admitting: Family Medicine

## 2023-03-27 ENCOUNTER — Encounter: Payer: Self-pay | Admitting: Family Medicine

## 2023-03-27 ENCOUNTER — Other Ambulatory Visit: Payer: Self-pay | Admitting: Family Medicine

## 2023-03-27 VITALS — BP 118/78 | HR 69 | Temp 98.4°F | Ht 67.0 in | Wt 241.8 lb

## 2023-03-27 DIAGNOSIS — F39 Unspecified mood [affective] disorder: Secondary | ICD-10-CM | POA: Diagnosis not present

## 2023-03-27 DIAGNOSIS — E538 Deficiency of other specified B group vitamins: Secondary | ICD-10-CM

## 2023-03-27 DIAGNOSIS — E559 Vitamin D deficiency, unspecified: Secondary | ICD-10-CM

## 2023-03-27 NOTE — Patient Instructions (Addendum)
It was a pleasure meeting you today. Thank you for allowing me to take part in your health care.  Our goals for today as we discussed include:  Increase Vitamin B 12 to 2 tablets daily  Repeat blood work in 1 month   If you have any questions or concerns, please do not hesitate to call the office at [phone].  I look forward to our next visit and until then take care and stay safe.  Regards,   Dana Allan, MD   [facility]

## 2023-03-27 NOTE — Progress Notes (Signed)
SUBJECTIVE:   Chief Complaint  Patient presents with   Medical Management of Chronic Issues    ER follow up/ ADH test results.     HPI Patient presents to clinic for follow-up from recent ED visit.  Was recently seen in the emergency at [facility] on 03/26 and 4 headache and high blood pressure.  She was treated with Toradol IM and droperidol.  Blood pressure was elevated in the ED 140-150/104-96.  CT head negative for acute signs of intracranial hemorrhage, or midline shift.  She reports her blood pressure had normalized after headache resolved.  ADHD results She recently had neuropsych evaluation and developmental screen on 3/18 at [facility].  Testing results showed significant abnormalities in attention, activity and impulsivity and therefore per DSM V criteria meets ADHD combined type.  Also noted was association with depression, anxiety and OCD.  Assessment also indicated some learning disability that was likely related to some auditory processing trouble.  Patient reports that she tried Adderall XR and was up to a maximum of 50 mg daily and a course of a few days however this did not work well for her and she felt overstimulated with this.  She does not want to restart Adderall and has not discussed any other alternatives for ADHD treatment.  She reports she is currently on Celexa 40 mg daily and was recently started Lamictal 25 mg daily.  She does not like the Celexa as it has caused her to gain weight.  She is not sure what the plan is with the Lamictal and if this is long-term or when adjustments will be made.  She does report that she has started a mindful wellness activity that was recommended by her psychiatrist.  She endorses that she is now having difficulty at home with her husband.  Is not sure how long this relationship will continue as her husband is having issues with her not being able to open up.  She does not have any friends and isolates herself as she does  not want them to worry about her and feels that it would be a burden on them.  She has interest in outside activities but does not take initiative to move forward.  Financially she is struggling with her children 1 of which has a lot of therapy that she financially supports.  She has had to miss her own therapies as cost has been an issue for her.  I discussed with her short-term disability to see if this would be beneficial but again she is not sure that she would make enough money to support family and she struggles financially already.  She also reports that her psychiatrist would like to discuss blood pressure readings that have been elevated at her clinic.  I have reviewed blood pressures that were taken at the psychiatry office.  They have been elevated however patient reports that at home her blood pressures are 120-130/70-80.  I have reviewed recent values taken at the clinic here and blood pressure over the past few years have been 118-130/72/80.  With the exception of ED visits for migraine headaches where she has had elevated blood pressures there has been no indication for antihypertensive therapy and therefore I will not start blood pressure medications at this time.  PERTINENT PMH / PSH: Mood disorder Migraines   OBJECTIVE:  BP 118/78   Pulse 69   Temp 98.4 F (36.9 C) (Oral)   Ht  (1.702 m)   Wt 241 lb  12.8 oz (109.7 kg)   LMP 02/15/2019   SpO2 97%   BMI 37.87 kg/m    Physical Exam Vitals reviewed.  Constitutional:      General: She is not in acute distress.    Appearance: Normal appearance. She is obese. She is not ill-appearing, toxic-appearing or diaphoretic.  Eyes:     General:        Right eye: No discharge.        Left eye: No discharge.     Conjunctiva/sclera: Conjunctivae normal.  Cardiovascular:     Rate and Rhythm: Normal rate.  Pulmonary:     Effort: Pulmonary effort is normal.  Neurological:     Mental Status: She is alert and oriented to person,  place, and time. Mental status is at baseline.  Psychiatric:        Attention and Perception: Attention normal.        Mood and Affect: Mood is anxious.        Speech: Speech normal.        Behavior: Behavior normal.        Thought Content: Thought content normal. Thought content does not include homicidal or suicidal ideation. Thought content does not include homicidal or suicidal plan.        Judgment: Judgment normal.     ASSESSMENT/PLAN:  Vitamin B 12 deficiency Assessment & Plan: Chronic.  Recent B12 increased from 190-256 to 1 month of 5000 mcg sublingual.  Folate 12.4 normal Hemoglobin and MCV within normal limits.   Increase vitamin B12 to 10,000 mcg sublingual daily Repeat B12 in 1 month  Orders: -     Vitamin B12; Future -     Vitamin B-12; Place 10 tablets (10,000 mcg total) under the tongue daily.  Dispense: 300 tablet; Refill: 3  Mood disorder Assessment & Plan: Chronic.  Currently on Celexa 40 mg daily and Lamictal 25 mg daily. She follows with Dr. Maryruth Bun.  Plan for Lamictal to increase to 50 mg daily in 2 weeks. She was recently evaluated at [facility] this and had neuropsych evaluation completed 03/03/2023.  Diagnosed with ADHD combined type specifically impulsivity and hyperactivity symptoms.  Tried Adderall XR up to maximum 50 mg daily and cannot tolerate side effects.  Therefore discontinued. Recommend continue CBT Recommend she discuss with psychiatry taking time off to focus on herself, short-term disability Recommend couples therapy Recommend focusing more on her self and setting small goals to achieve She will continue to follow-up with psychiatry    PDMP reviewed  Return if symptoms worsen or fail to improve.  Dana Allan, MD

## 2023-04-01 ENCOUNTER — Ambulatory Visit: Payer: Managed Care, Other (non HMO) | Admitting: Family Medicine

## 2023-04-05 ENCOUNTER — Encounter: Payer: Self-pay | Admitting: Family Medicine

## 2023-04-05 MED ORDER — VITAMIN B-12 1000 MCG SL SUBL: 10000.0000 ug | SUBLINGUAL_TABLET | Freq: Every day | SUBLINGUAL | 3 refills | Status: AC

## 2023-04-05 NOTE — Assessment & Plan Note (Addendum)
Chronic.  Recent B12 increased from 190-256 to 1 month of 5000 mcg sublingual.  Folate 12.4 normal Hemoglobin and MCV within normal limits.   Increase vitamin B12 to 10,000 mcg sublingual daily Repeat B12 in 1 month

## 2023-04-05 NOTE — Assessment & Plan Note (Signed)
Chronic.  Currently on Celexa 40 mg daily and Lamictal 25 mg daily. She follows with Dr. Maryruth Bun.  Plan for Lamictal to increase to 50 mg daily in 2 weeks. She was recently evaluated at [facility] this and had neuropsych evaluation completed 03/03/2023.  Diagnosed with ADHD combined type specifically impulsivity and hyperactivity symptoms.  Tried Adderall XR up to maximum 50 mg daily and cannot tolerate side effects.  Therefore discontinued. Recommend continue CBT Recommend she discuss with psychiatry taking time off to focus on herself, short-term disability Recommend couples therapy Recommend focusing more on her self and setting small goals to achieve She will continue to follow-up with psychiatry

## 2023-10-24 ENCOUNTER — Other Ambulatory Visit: Payer: Self-pay | Admitting: Family Medicine

## 2023-10-24 DIAGNOSIS — Z1231 Encounter for screening mammogram for malignant neoplasm of breast: Secondary | ICD-10-CM

## 2023-10-31 ENCOUNTER — Other Ambulatory Visit: Payer: Self-pay | Admitting: Family Medicine

## 2023-10-31 DIAGNOSIS — M21372 Foot drop, left foot: Secondary | ICD-10-CM

## 2023-10-31 DIAGNOSIS — M5416 Radiculopathy, lumbar region: Secondary | ICD-10-CM

## 2023-11-03 ENCOUNTER — Ambulatory Visit
Admission: RE | Admit: 2023-11-03 | Discharge: 2023-11-03 | Disposition: A | Discharge status: Home / Self Care | Payer: Managed Care, Other (non HMO) | Source: Ambulatory Visit | Attending: Family Medicine | Admitting: Family Medicine

## 2023-11-03 ENCOUNTER — Other Ambulatory Visit: Payer: Self-pay | Admitting: Family Medicine

## 2023-11-03 DIAGNOSIS — M5412 Radiculopathy, cervical region: Secondary | ICD-10-CM | POA: Insufficient documentation

## 2023-11-07 ENCOUNTER — Ambulatory Visit: Payer: Managed Care, Other (non HMO) | Admitting: Obstetrics
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
## 2023-11-20 ENCOUNTER — Other Ambulatory Visit: Payer: Managed Care, Other (non HMO)

## 2023-11-20 ENCOUNTER — Ambulatory Visit
Admission: RE | Admit: 2023-11-20 | Discharge: 2023-11-20 | Disposition: A | Discharge status: Home / Self Care | Payer: Managed Care, Other (non HMO) | Source: Ambulatory Visit | Attending: Family Medicine | Admitting: Family Medicine

## 2023-11-20 DIAGNOSIS — M21372 Foot drop, left foot: Secondary | ICD-10-CM | POA: Insufficient documentation

## 2023-11-20 DIAGNOSIS — M5416 Radiculopathy, lumbar region: Secondary | ICD-10-CM | POA: Diagnosis present

## 2023-12-02 ENCOUNTER — Ambulatory Visit
Admission: RE | Admit: 2023-12-02 | Discharge: 2023-12-02 | Disposition: A | Discharge status: Home / Self Care | Payer: Managed Care, Other (non HMO) | Source: Ambulatory Visit | Attending: Family Medicine | Admitting: Family Medicine

## 2023-12-02 DIAGNOSIS — Z1231 Encounter for screening mammogram for malignant neoplasm of breast: Secondary | ICD-10-CM | POA: Insufficient documentation

## 2024-03-14 ENCOUNTER — Emergency Department

## 2024-03-14 ENCOUNTER — Other Ambulatory Visit: Payer: Self-pay

## 2024-03-14 ENCOUNTER — Emergency Department
Admission: EM | Admit: 2024-03-14 | Discharge: 2024-03-14 | Disposition: A | Discharge status: Home / Self Care | Attending: Emergency Medicine | Admitting: Emergency Medicine

## 2024-03-14 ENCOUNTER — Encounter: Payer: Self-pay | Admitting: Emergency Medicine

## 2024-03-14 DIAGNOSIS — S060X1A Concussion with loss of consciousness of 30 minutes or less, initial encounter: Secondary | ICD-10-CM | POA: Diagnosis not present

## 2024-03-14 DIAGNOSIS — M542 Cervicalgia: Secondary | ICD-10-CM | POA: Diagnosis not present

## 2024-03-14 DIAGNOSIS — S0990XA Unspecified injury of head, initial encounter: Secondary | ICD-10-CM | POA: Diagnosis present

## 2024-03-14 DIAGNOSIS — I10 Essential (primary) hypertension: Secondary | ICD-10-CM | POA: Insufficient documentation

## 2024-03-14 DIAGNOSIS — W208XXA Other cause of strike by thrown, projected or falling object, initial encounter: Secondary | ICD-10-CM | POA: Insufficient documentation

## 2024-03-14 MED ORDER — ONDANSETRON 4 MG PO TBDP
4.0000 mg | ORAL_TABLET | Freq: Once | ORAL | Status: AC
  Administered 2024-03-14: 4 mg via ORAL
  Filled 2024-03-14: qty 1

## 2024-03-14 MED ORDER — ACETAMINOPHEN-CODEINE 300-30 MG PO TABS: 1.0000 | ORAL_TABLET | Freq: Four times a day (QID) | ORAL | 0 refills | Status: AC | PRN

## 2024-03-14 MED ORDER — ACETAMINOPHEN-CODEINE 300-30 MG PO TABS
2.0000 | ORAL_TABLET | Freq: Once | ORAL | Status: AC
  Administered 2024-03-14: 2 via ORAL
  Filled 2024-03-14: qty 2

## 2024-03-14 MED ORDER — ONDANSETRON 4 MG PO TBDP: 4.0000 mg | ORAL_TABLET | Freq: Three times a day (TID) | ORAL | 0 refills | Status: AC | PRN

## 2024-03-14 MED ORDER — METHOCARBAMOL 500 MG PO TABS: 500.0000 mg | ORAL_TABLET | Freq: Three times a day (TID) | ORAL | 0 refills | Status: AC | PRN

## 2024-03-14 NOTE — Discharge Instructions (Signed)
 Follow-up with your primary care provider in about a week for recheck or sooner if symptoms change or worsen.  Do not take additional Tylenol/acetaminophen while taking the Tylenol with codeine.  Do not drive if taking the muscle relaxer or the Tylenol with codeine.  Return to the emergency department immediately if symptoms suddenly change or worsen.

## 2024-03-14 NOTE — ED Triage Notes (Signed)
 Patient to ED via POV fro head injury. Pt was cleaning out a closet when a pain can hit her in the head. Did have LOC, no blood thinners. C/o head and neck pain.
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
## 2024-05-28 ENCOUNTER — Other Ambulatory Visit: Payer: Self-pay | Admitting: Physician Assistant

## 2024-05-28 DIAGNOSIS — E041 Nontoxic single thyroid nodule: Secondary | ICD-10-CM

## 2024-05-31 ENCOUNTER — Ambulatory Visit
Admission: RE | Admit: 2024-05-31 | Discharge: 2024-05-31 | Disposition: A | Discharge status: Home / Self Care | Source: Ambulatory Visit | Attending: Physician Assistant | Admitting: Physician Assistant

## 2024-05-31 DIAGNOSIS — E041 Nontoxic single thyroid nodule: Secondary | ICD-10-CM

## 2024-06-04 ENCOUNTER — Other Ambulatory Visit: Payer: Self-pay | Admitting: Physician Assistant

## 2024-06-04 DIAGNOSIS — E041 Nontoxic single thyroid nodule: Secondary | ICD-10-CM

## 2024-06-08 NOTE — Progress Notes (Signed)
 Patient for US  guided FNA Inferior RT Thyroid  Nodule Biopsy on Wed 06/09/24, I called and spoke with the patient on the phone and gave pre-procedure instructions. Pt was made aware to be here at 2p and check in at the [facility] registration desk. Pt stated understanding. Called 06/08/24

## 2024-06-09 ENCOUNTER — Ambulatory Visit
Admission: RE | Admit: 2024-06-09 | Discharge: 2024-06-09 | Disposition: A | Discharge status: Home / Self Care | Source: Ambulatory Visit | Attending: Physician Assistant | Admitting: Physician Assistant

## 2024-06-09 DIAGNOSIS — E041 Nontoxic single thyroid nodule: Secondary | ICD-10-CM | POA: Diagnosis present

## 2024-06-09 MED ORDER — LIDOCAINE HCL (PF) 1 % IJ SOLN
10.0000 mL | Freq: Once | INTRAMUSCULAR | Status: AC
  Administered 2024-06-09: 10 mL via INTRADERMAL
  Filled 2024-06-09: qty 10

## 2024-06-10 LAB — CYTOLOGY - NON PAP

## 2024-06-11 ENCOUNTER — Ambulatory Visit

## 2024-06-17 ENCOUNTER — Encounter (HOSPITAL_COMMUNITY): Payer: Self-pay | Admitting: *Deleted

## 2024-07-12 ENCOUNTER — Other Ambulatory Visit: Payer: Self-pay | Admitting: Medical Genetics

## 2024-07-15 ENCOUNTER — Other Ambulatory Visit
Admission: RE | Admit: 2024-07-15 | Discharge: 2024-07-15 | Disposition: A | Discharge status: Home / Self Care | Payer: Self-pay | Source: Ambulatory Visit | Attending: Medical Genetics | Admitting: Medical Genetics

## 2024-07-16 ENCOUNTER — Other Ambulatory Visit

## 2024-07-27 LAB — GENECONNECT MOLECULAR SCREEN: Genetic Analysis Overall Interpretation: NEGATIVE

## 2024-08-03 ENCOUNTER — Other Ambulatory Visit: Payer: Self-pay | Admitting: Specialist

## 2024-08-03 DIAGNOSIS — I10 Essential (primary) hypertension: Secondary | ICD-10-CM

## 2024-08-04 ENCOUNTER — Inpatient Hospital Stay
Admission: RE | Admit: 2024-08-04 | Discharge: 2024-08-04 | Discharge status: Home / Self Care | Source: Ambulatory Visit | Attending: Specialist | Admitting: Specialist

## 2024-08-04 ENCOUNTER — Ambulatory Visit
Admission: RE | Admit: 2024-08-04 | Discharge: 2024-08-04 | Disposition: A | Discharge status: Home / Self Care | Source: Ambulatory Visit | Attending: Specialist | Admitting: Specialist

## 2024-08-04 DIAGNOSIS — I10 Essential (primary) hypertension: Secondary | ICD-10-CM | POA: Diagnosis present

## 2024-08-04 DIAGNOSIS — Z01812 Encounter for preprocedural laboratory examination: Secondary | ICD-10-CM | POA: Diagnosis not present

## 2024-08-17 NOTE — Progress Notes (Signed)
 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 Duodenal switch preferred over SADI. Informed about potential insurance challenges. Recovery expected similar to previous surgery. Emphasized commitment to protein and vitamin intake. B12 injections not necessary from surgical perspective. - Submit letter of medical necessity to insurance for surgery approval. - Schedule nutrition class for diet instructions and supplement guidance. - Proceed with B12 injections as advised by primary care physician. - Coordinate with anesthesia for pre-surgery evaluation.      Problem List[1]      Plan  The patient has completed the pre-operative program, including nutritional assessment and education, psychological evaluation, family support assessment, sleep study, endoscopy, laboratory profile, and support groups.  We discussed the operative choices, the risks, benefits, and alternative approaches to obesity management, perioperative care, surgery, and long term nutritional care.    The patient had demonstrated a willingness to make lifestyle changes, adopt healthy eating, exercise, and move away from prior obesity related habits.    We discussed risks to include bleeding, infection, leak, nutritional deficiency, scar tissue and bowel  obstruction, injury to normal surrounding structures, conversion to open technique, heart and lung complications, DVT and PE.    Our mandatory education program reviewed postoperative risks anastomotic leak, marginal ulcers, internal hernias, nutritional (vitamin and protein) derangements, dumping syndrome/hypoglycemia, reflux disease, change in bowel habits (constipation or diarrhea), and inadequate weight loss.  Avoidance of NSAIDs (for bypass), alcohol and nicotine was encouraged long term to reduce the risk of ulcers.  We discussed the risk of postoperative transference of addictions and skin laxity, as well as the psychological effects these may have on body image and behaviors.     The patient understands the nature and potential complications of surgery, demonstrated the capacity to follow the post-operative care and nutritional requirements, and wishes to proceed with authorization.  We also evaluated the level of family/caregiver support and understanding of the procedure, risks and benefits, and related care plan (when the patient consented to sharing health information), deeming this acceptable.  We reviewed through our patient education and the consultation visit, the operative risks including bleeding, infection, re-operation, heart and lung complications, DVT and PE, and morbidity and mortality risks.  Post-operative complications discussed include, but are not limited to, anastomotic leak, stricture or stenosis, marginal ulcer, gallbladder and liver related disease, incisional and internal hernia, short bowel syndrome, nutritional derangements, hypoglycemia, change in bowel habits (diarrhea and constipation), and inadequate weight loss.  Long term avoidance of non-steroidal anti-inflammatory medications (NSAID/gastric bypass specific), alcohol, and nicotine was strongly encouraged to reduce the risk of ulcers.    The patient has been evaluated by a bariatric surgeon who recommends Lap conversion  of sleeve gastrectomy to duodenal switch (DS).  This operation had been demonstrated significant and durable weight loss, expecting greater than 50% excess weight loss in the first year.   Additionally, the procedure and associate weight loss offers  the benefit of comorbidity improvement and prevention, specifically for hypertension, type 2 diabetes mellitus, obstructive sleep apnea, heart disease, and hyperlipidemia.    Today, we spent 20 minutes with the patient with greater than 50% spent counseling and coordinating care.     Chief Complaint:  Obesity   History of Present Illness  Debbie Townsend is a 38 y.o. year old female who presents with chronic obesity.  She reports a significant history obesity and failed efforts at weight loss, including various diet and exercise regimens.  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 She  is actively adopting changes towards a healthier lifestyle, including dietary education, vitamin/protein supplementation, behavioral and exercise changes as directed by the bariatric program.  She is currently following the bariatric diet plan (1200 kcal/day goal) and adopting key behavioral challenges through mindful eating, eating more slowly and chewing well, eliminating refined carbohydrates, sweets/snacks, sugary drinks, and preparing healthy foods.  She is exercising at goal, 3 days a week for 30-45 minutes.  She denies any recent or new acute illnesses, pain, fever, chills, or significant physical or mental health changes.  Mayar wishes to proceed with bariatric surgery.    Past Medical History  Past Medical History[2]   Past Surgical History  Past Surgical History[3]   Allergies  Patient has no known allergies.   Medications    Current Medications[4]   Family History  Family History[5]   Social History  Social History[6]   Review of Systems  Review of Systems  All  other systems reviewed and are negative.            Vital Signs  BP 146/91   Pulse 63   Ht 167.6 cm (5' 6)   Wt (!) 108.4 kg (239 lb)   SpO2 99%   BMI 38.58 kg/m     Physical Exam   General Appearance:    No acute distress  Lungs:     Clear to auscultation bilaterally  Heart:    Regular rate and rhythm  Abdomen:     Soft, non-tender, non-distended  Extremities:   Warm and well perfused               [1] There is no problem list on file for this patient. [2] Past Medical History: Diagnosis Date  . Anxiety   . Depression   . Hypertension   . Pseudotumor   [3] Past Surgical History: Procedure Laterality Date  . ANKLE SURGERY    . APPENDECTOMY    . CESAREAN SECTION     x2  . CHOLECYSTECTOMY    . CSF LEAK STUDY CT [facility] HISTORICAL RESULT)    . FRACTURE SURGERY    . gastric sleeve     pre-op wt: 278, Dr.connor-    . HYSTERECTOMY    . LEG SURGERY    [4] Current Outpatient Medications  Medication Sig Dispense Refill  . citalopram (CELEXA) 40 MG tablet TAKE 1 DAILY WITH BREAKFAST    . dextroamphetamine-amphetamine (ADDERALL XR) 20 MG 24 hr capsule Take 1 capsule (20 mg total) by mouth daily.    . hydroCHLOROthiazide (HYDRODIURIL) 25 MG tablet TAKE 1 TABLET BY MOUTH EVERY DAY IN THE MORNING FOR 90 DAYS     No current facility-administered medications for this visit.  [5] Family History Problem Relation Age of Onset  . Hypertension Mother   . Diabetes Mother   . Cancer Mother   . Kidney disease Father   . Hypertension Father   .  Hypertension Sister   [6]   Social Drivers of Health   Physical Activity: Insufficiently Active (04/23/2018)   Received from [facility]   Exercise Vital Sign   . Days of Exercise per Week: 3 days   . Minutes of Exercise per Session: 40 min  Stress: Stress Concern Present (04/23/2018)   Received from [facility] - Occupational Stress Questionnaire   . Feeling  of Stress : Rather much

## 2024-09-29 ENCOUNTER — Ambulatory Visit: Admission: EM | Admit: 2024-09-29 | Discharge: 2024-09-29 | Disposition: A | Discharge status: Home / Self Care

## 2024-09-29 ENCOUNTER — Encounter: Payer: Self-pay | Admitting: Emergency Medicine

## 2024-09-29 DIAGNOSIS — J069 Acute upper respiratory infection, unspecified: Secondary | ICD-10-CM

## 2024-09-29 MED ORDER — PREDNISONE 10 MG (21) PO TBPK: ORAL_TABLET | Freq: Every day | ORAL | 0 refills | Status: AC

## 2024-09-29 MED ORDER — METOCLOPRAMIDE HCL 5 MG/ML IJ SOLN
10.0000 mg | Freq: Once | INTRAMUSCULAR | Status: AC
  Administered 2024-09-29: 10 mg via INTRAMUSCULAR

## 2024-09-29 MED ORDER — KETOROLAC TROMETHAMINE 30 MG/ML IJ SOLN
30.0000 mg | Freq: Once | INTRAMUSCULAR | Status: AC
  Administered 2024-09-29: 30 mg via INTRAMUSCULAR

## 2024-09-29 MED ORDER — DEXAMETHASONE SOD PHOSPHATE PF 10 MG/ML IJ SOLN
10.0000 mg | Freq: Once | INTRAMUSCULAR | Status: AC
  Administered 2024-09-29: 10 mg via INTRAMUSCULAR

## 2024-09-29 MED ORDER — PROMETHAZINE-DM 6.25-15 MG/5ML PO SYRP: 5.0000 mL | ORAL_SOLUTION | Freq: Four times a day (QID) | ORAL | 0 refills | Status: AC | PRN

## 2024-09-29 NOTE — ED Triage Notes (Signed)
 Patient complains of cough, sore throat and headache x 4 days. Patient also reports that her chest hurts when she takes a deep breath. Rates sore throat 7/10 and rates headache 10/10. Patient reports taking Tylenol  at 7 am for fever of 101.9 today.

## 2024-09-29 NOTE — ED Provider Notes (Signed)
 [facility]    CSN: [id number] Arrival date & time: 09/29/24  9195      History   Chief Complaint Chief Complaint  Patient presents with   Cough   Sore Throat   Headache   Fever    HPI Debbie Townsend is a 38 y.o. female.   Patient presents for evaluation of a fever peaking at 102.5, generalized bodyaches, right sided ear fullness and intermittent aching, sore throat, productive cough with green sputum with accompanying shortness of breath, wheezing and episode of vomiting and watery diarrhea beginning 4 days ago.  Began to experience a frontal headache primarily behind the eyes with associated light and noise sensitivity described as pulsating, has been constant.  History of migraines.  Known sick contacts in household.  Tolerable to food and liquids but appetite is decreased.  Has attempted use of Tylenol .  Denies respiratory history, non-smoker.  Denies lightheadedness, blurry vision, dizziness, sinus pressure or nasal congestion.  Past Medical History:  Diagnosis Date   Adenomyosis 02/04/2019   Annual physical exam 09/24/2019   Antibiotic-induced yeast infection 11/05/2021   Anxiety    Anxiety 04/02/2018   Anxiety and depression 12/16/2016   BMI 36.0-36.9,adult 04/19/2022   BRCA negative    MyRisk update neg 5/22   Burn- left small finger  11/05/2021   Calcinosis 11/02/2022   Cesarean delivery delivered 03/22/2016   Formatting of this note might be different from the original.  03/19/16   Chest pain of uncertain etiology 11/01/2021   CSF leak from nose 12/07/2019   Depression    Depression, recurrent 07/03/2020   DUB (dysfunctional uterine bleeding) 09/10/2018   Essential hypertension 11/13/2015   Family history of anesthesia complication     mother had Post -op nausea and dizziness.   Family history of breast cancer    Fat necrosis of left breast 11/29/2021   Fatigue 09/24/2019   Fatty liver 04/02/2018   US  02/16/18    FH: breast cancer in first degree  relative 04/02/2018   Fibula fracture    distal end f/u [facility] spring 2022 after fall    Foot drop, left foot    Gallstones 04/02/2018   GERD (gastroesophageal reflux disease)    H/O gastric sleeve 12/14/2020   Hair loss 07/03/2020   Headache    migraines   Heavy menses 04/02/2018   History of kidney stones    History of kidney stones 09/24/2019   HNP (herniated nucleus pulposus), lumbar 09/22/2014   Hypertension    better since gastric sleeve.  no meds.   Increased risk of breast cancer 04/2021   IBIS=20.3%/riskscore=35.6%   Insomnia 09/24/2019   Iron deficiency 11/03/2020   Kidney stone    Nausea 08/30/2022   PONV (postoperative nausea and vomiting)    Pseudotumor cerebri 2007 or 2009   Pseudotumor cerebri 11/13/2015   Rapid palpitations 11/01/2021   Rubella non-immune status, antepartum 01/08/2016   Formatting of this note might be different from the original. Offer immunization PP   S/P bariatric surgery 04/19/2022   Skin infection 11/05/2021   Status post laparoscopic hysterectomy 02/12/2019    Patient Active Problem List   Diagnosis Date Noted   Attention deficit disorder 01/24/2023   Mood disorder 12/12/2022   Vitamin B 12 deficiency 08/16/2022   Migraine, unspecified, not intractable, without status migrainosus 06/28/2022   Insulin resistance 04/19/2022   Obesity (BMI 30-39.9) 05/07/2021   Prediabetes 07/03/2020   Intractable migraine without aura and without status migrainosus 04/04/2020  Gastroesophageal reflux disease without esophagitis 03/29/2020   Chronic midline low back pain 02/13/2020   Other optic atrophy, bilateral 12/07/2019   IIH (idiopathic intracranial hypertension) 12/07/2019   Annual physical exam 09/24/2019   Allergic rhinitis 03/24/2019    Past Surgical History:  Procedure Laterality Date   ABDOMINAL HYSTERECTOMY     CESAREAN SECTION     2009/2017   CHOLECYSTECTOMY N/A 02/04/2019   Procedure: LAPAROSCOPIC CHOLECYSTECTOMY;  Surgeon:  Marolyn Nest, MD;  Location: [facility];  Service: General;  Laterality: N/A;   LAPAROSCOPIC GASTRIC SLEEVE RESECTION N/A 11/14/2020   Procedure: LAPAROSCOPIC GASTRIC SLEEVE RESECTION;  Surgeon: Signe Mitzie LABOR, MD;  Location: [facility];  Service: General;  Laterality: N/A;   LUMBAR LAMINECTOMY/DECOMPRESSION MICRODISCECTOMY Left 09/22/2014   Procedure: Left Lumbar four-five microdiskectomy;  Surgeon: Gerldine Maizes, MD;  Location: [facility];  Service: Neurosurgery;  Laterality: Left;  Left Lumbar four-five microdiskectomy   ORIF ANKLE FRACTURE Left 03/23/2021   Procedure: OPEN REDUCTION INTERNAL FIXATION (ORIF) Left lateral malleolus with syndesmosis repair;  Surgeon: Tobie Priest, MD;  Location: [facility];  Service: Orthopedics;  Laterality: Left;   UPPER GI ENDOSCOPY N/A 11/14/2020   Procedure: UPPER GI ENDOSCOPY;  Surgeon: Signe Mitzie LABOR, MD;  Location: [facility];  Service: General;  Laterality: N/A;   WISDOM TOOTH EXTRACTION      OB History     Gravida  2   Para  2   Term  1   Preterm  1   AB      Living  2      SAB      IAB      Ectopic      Multiple      Live Births  2            Home Medications    Prior to Admission medications   Medication Sig Start Date End Date Taking? Authorizing Provider  predniSONE  (STERAPRED UNI-PAK 21 TAB) 10 MG (21) TBPK tablet Take by mouth daily. Take 6 tabs by mouth daily  for 1 days, then 5 tabs for 1 days, then 4 tabs for 1 days, then 3 tabs for 1 days, 2 tabs for 1 days, then 1 tab by mouth daily for 1 days 09/29/24  Yes Kreg Earhart R, NP  promethazine -dextromethorphan (PROMETHAZINE -DM) 6.25-15 MG/5ML syrup Take 5 mLs by mouth 4 (four) times daily as needed for cough. 09/29/24  Yes Beckam Abdulaziz R, NP  acetaminophen -codeine  (TYLENOL  #3) 300-30 MG tablet Take 1-2 tablets by mouth every 6 (six) hours as needed for moderate pain (pain score 4-6). 03/14/24   Triplett, Cari B, FNP  amphetamine-dextroamphetamine  (ADDERALL XR) 20 MG 24 hr capsule Take 20 mg by mouth daily.    [provider]  CALCIUM CITRATE PO Take 1 tablet by mouth daily.    [provider]  Cholecalciferol (VITAMIN D3) 25 MCG (1000 UT) CAPS Take 1 capsule (1,000 Units total) by mouth daily. Patient not taking: Reported on 03/27/2023 01/24/23   Walsh, Tanya, MD  citalopram (CELEXA) 40 MG tablet Take 40 mg by mouth daily.    [provider]  Cyanocobalamin  (VITAMIN B-12) 1000 MCG SUBL Place 10 tablets (10,000 mcg total) under the tongue daily. 04/05/23   Hope Merle, MD  famotidine  (PEPCID ) 20 MG tablet Take by mouth. 12/26/20   [provider]  lamoTRIgine (LAMICTAL) 25 MG tablet Take by mouth. 03/18/23   [provider]  methocarbamol  (ROBAXIN ) 500 MG tablet Take 1 tablet (  500 mg total) by mouth every 8 (eight) hours as needed. 03/14/24   Triplett, Cari B, FNP  Multiple Vitamins-Minerals (MULTIVITAMIN WITH MINERALS) tablet Take 1 tablet by mouth daily. Bariatric advantage one a day    [provider]  NURTEC 75 MG TBDP Take 1 tablet by mouth as needed. 01/06/22   [provider]  ondansetron  (ZOFRAN -ODT) 4 MG disintegrating tablet Take 1 tablet (4 mg total) by mouth every 8 (eight) hours as needed for nausea or vomiting. 03/14/24   Triplett, Cari B, FNP  pantoprazole  (PROTONIX ) 40 MG tablet Take 1 tablet by mouth daily. 01/03/22   [provider]  UBRELVY 50 MG TABS Take by mouth.    [provider]  Vitamin D , Ergocalciferol , (DRISDOL ) 1.25 MG (50000 UNIT) CAPS capsule TAKE 1 CAPSULE (50,000 UNITS TOTAL) BY MOUTH EVERY 7 (SEVEN) DAYS 03/27/23   Hope Merle, MD    Family History Family History  Problem Relation Age of Onset   Cancer Mother        breast dx'ed age 92/49   Hypertension Mother    Diabetes Mother    Hypothyroidism Mother    Depression Mother    Breast cancer Mother        5s   Liver disease Father    Heart attack Father    Arthritis Father     Alcohol abuse Father    Depression Father    Drug abuse Father    Early death Father    Heart disease Father    Hypertension Father    Hepatitis C Father    Depression Sister    Hypertension Sister    Arthritis Sister    Hypertension Sister    Cancer Maternal Grandmother        stomach>liver>brain met   Cancer Maternal Uncle        lung not a smoker     Social History Social History   Tobacco Use   Smoking status: Former    Current packs/day: 0.00    Types: Cigarettes    Quit date: 03/2019    Years since quitting: 5.5   Smokeless tobacco: Never  Vaping Use   Vaping status: Never Used  Substance Use Topics   Alcohol use: Not Currently   Drug use: No     Allergies   Apple juice, Carrot [daucus carota], Miconazole, Other, Almond oil, Amoxicillin , Imitrex [sumatriptan], and Topamax [topiramate]   Review of Systems Review of Systems   Physical Exam Triage Vital Signs ED Triage Vitals  Encounter Vitals Group     BP 09/29/24 0848 132/88     Girls Systolic BP Percentile --      Girls Diastolic BP Percentile --      Boys Systolic BP Percentile --      Boys Diastolic BP Percentile --      Pulse Rate 09/29/24 0848 78     Resp 09/29/24 0848 20     Temp 09/29/24 0848 98.4 F (36.9 C)     Temp Source 09/29/24 0848 Oral     SpO2 09/29/24 0848 99 %     Weight --      Height --      Head Circumference --      Peak Flow --      Pain Score 09/29/24 0850 7     Pain Loc --      Pain Education --      Exclude from Growth Chart --    No data found.  Updated Vital Signs BP 132/88 (BP Location: Left Arm)   Pulse 78   Temp 98.4 F (36.9 C) (Oral)   Resp 20   LMP 02/15/2019   SpO2 99%   Visual Acuity Right Eye Distance:   Left Eye Distance:   Bilateral Distance:    Right Eye Near:   Left Eye Near:    Bilateral Near:     Physical Exam Constitutional:      Appearance: She is ill-appearing.  HENT:     Head: Normocephalic.     Right Ear: Tympanic  membrane, ear canal and external ear normal.     Left Ear: Tympanic membrane, ear canal and external ear normal.     Nose: Nose normal.     Mouth/Throat:     Pharynx: No oropharyngeal exudate or posterior oropharyngeal erythema.  Cardiovascular:     Rate and Rhythm: Normal rate and regular rhythm.     Pulses: Normal pulses.     Heart sounds: Normal heart sounds.  Pulmonary:     Effort: Pulmonary effort is normal.     Breath sounds: Normal breath sounds.  Neurological:     General: No focal deficit present.     Mental Status: She is alert and oriented to person, place, and time. Mental status is at baseline.      UC Treatments / Results  Labs (all labs ordered are listed, but only abnormal results are displayed) Labs Reviewed - No data to display  EKG   Radiology No results found.  Procedures Procedures (including critical care time)  Medications Ordered in UC Medications  ketorolac  (TORADOL ) 30 MG/ML injection 30 mg (30 mg Intramuscular Given 09/29/24 0931)  dexamethasone  (DECADRON ) injection 10 mg (10 mg Intramuscular Given 09/29/24 0931)  metoCLOPramide  (REGLAN ) injection 10 mg (10 mg Intramuscular Given 09/29/24 0931)    Initial Impression / Assessment and Plan / UC Course  I have reviewed the triage vital signs and the nursing notes.  Pertinent labs & imaging results that were available during my care of the patient were reviewed by me and considered in my medical decision making (see chart for details).  Viral URI with cough  Patient is in no signs of distress nor toxic appearing.  Vital signs are stable.  Low suspicion for pneumonia, pneumothorax or bronchitis and therefore will defer imaging.  Would like to defer testing, etiology most likely viral as known sick contacts are within her household.  Most concerned with migraine headache today, Toradol , Decadron  and Reglan  given prior to discharge.  Wheezing heard to auscultation,, prescribed oral prednisone  and  discussed administration, additionally prescribed Promethazine  DM, deferred use of Tessalon  as she deems ineffective.May use additional over-the-counter medications as needed for supportive care.  May follow-up with urgent care as needed if symptoms persist or worsen.  Note given.   Final Clinical Impressions(s) / UC Diagnoses   Final diagnoses:  Viral URI with cough     Discharge Instructions      Your symptoms today are most likely being caused by a virus and should steadily improve in time it can take up to 7 to 10 days before you truly start to see a turnaround however things will get better  For your headache you have beginning with an injection of Toradol , Decadron  and Reglan , daily should start to see improvement within an hour  Starting tomorrow take prednisone  every morning with food as directed to open and relax the airway, this should resolve wheezing that was heard on exam ,  this will also keep headache pressure less than, avoid ibuprofen while taking but may use Tylenol   You may use cough syrup every 6 hours as needed, be mindful this may make you feel sleepy    You can take Tylenol  and/or Ibuprofen as needed for fever reduction and pain relief.   For cough: honey 1/2 to 1 teaspoon (you can dilute the honey in water  or another fluid).  You can also use guaifenesin and dextromethorphan for cough. You can use a humidifier for chest congestion and cough.  If you don't have a humidifier, you can sit in the bathroom with the hot shower running.      For sore throat: try warm salt water  gargles, cepacol lozenges, throat spray, warm tea or water  with lemon/honey, popsicles or ice, or OTC cold relief medicine for throat discomfort.   For congestion: take a daily anti-histamine like Zyrtec, Claritin, and a oral decongestant, such as pseudoephedrine.  You can also use Flonase  1-2 sprays in each nostril daily.   It is important to stay hydrated: drink plenty of fluids (water ,  gatorade/powerade/pedialyte, juices, or teas) to keep your throat moisturized and help further relieve irritation/discomfort.    ED Prescriptions     Medication Sig Dispense Auth. Provider   predniSONE  (STERAPRED UNI-PAK 21 TAB) 10 MG (21) TBPK tablet Take by mouth daily. Take 6 tabs by mouth daily  for 1 days, then 5 tabs for 1 days, then 4 tabs for 1 days, then 3 tabs for 1 days, 2 tabs for 1 days, then 1 tab by mouth daily for 1 days 21 tablet Nicasio Barlowe R, NP   promethazine -dextromethorphan (PROMETHAZINE -DM) 6.25-15 MG/5ML syrup Take 5 mLs by mouth 4 (four) times daily as needed for cough. 118 mL Diondre Pulis, Shelba SAUNDERS, NP      PDMP not reviewed this encounter.   Teresa Shelba SAUNDERS, NP 09/29/24 [phone]

## 2024-09-29 NOTE — Discharge Instructions (Addendum)
 Your symptoms today are most likely being caused by a virus and should steadily improve in time it can take up to 7 to 10 days before you truly start to see a turnaround however things will get better  For your headache you have beginning with an injection of Toradol , Decadron  and Reglan , daily should start to see improvement within an hour  Starting tomorrow take prednisone  every morning with food as directed to open and relax the airway, this should resolve wheezing that was heard on exam , this will also keep headache pressure less than, avoid ibuprofen while taking but may use Tylenol   You may use cough syrup every 6 hours as needed, be mindful this may make you feel sleepy    You can take Tylenol  and/or Ibuprofen as needed for fever reduction and pain relief.   For cough: honey 1/2 to 1 teaspoon (you can dilute the honey in water  or another fluid).  You can also use guaifenesin and dextromethorphan for cough. You can use a humidifier for chest congestion and cough.  If you don't have a humidifier, you can sit in the bathroom with the hot shower running.      For sore throat: try warm salt water  gargles, cepacol lozenges, throat spray, warm tea or water  with lemon/honey, popsicles or ice, or OTC cold relief medicine for throat discomfort.   For congestion: take a daily anti-histamine like Zyrtec, Claritin, and a oral decongestant, such as pseudoephedrine.  You can also use Flonase  1-2 sprays in each nostril daily.   It is important to stay hydrated: drink plenty of fluids (water , gatorade/powerade/pedialyte, juices, or teas) to keep your throat moisturized and help further relieve irritation/discomfort.

## 2024-10-13 ENCOUNTER — Other Ambulatory Visit: Payer: Self-pay

## 2024-10-13 MED ORDER — FLUCONAZOLE 200 MG PO TABS
200.0000 mg | ORAL_TABLET | Freq: Once | ORAL | 0 refills | Status: AC
  Filled 2024-10-13: qty 1, 1d supply, fill #0

## 2024-11-26 ENCOUNTER — Telehealth: Payer: Self-pay

## 2024-11-26 ENCOUNTER — Other Ambulatory Visit: Payer: Self-pay

## 2024-11-26 MED ORDER — CITALOPRAM HYDROBROMIDE 40 MG PO TABS
40.0000 mg | ORAL_TABLET | Freq: Every day | ORAL | 0 refills | Status: AC
  Filled 2024-11-26: qty 90, 90d supply, fill #0

## 2024-11-26 MED ORDER — AMPHETAMINE-DEXTROAMPHET ER 20 MG PO CP24
20.0000 mg | ORAL_CAPSULE | Freq: Every day | ORAL | 0 refills | Status: AC
  Filled 2024-12-27: qty 30, 30d supply, fill #0

## 2024-11-26 MED ORDER — ONDANSETRON 4 MG PO TBDP: 4.0000 mg | ORAL_TABLET | Freq: Three times a day (TID) | ORAL | 1 refills | Status: AC | PRN

## 2024-11-26 MED ORDER — AMPHETAMINE-DEXTROAMPHET ER 20 MG PO CP24: 20.0000 mg | ORAL_CAPSULE | Freq: Every day | ORAL | 0 refills | Status: AC

## 2024-11-26 MED ORDER — LAMOTRIGINE 150 MG PO TABS
150.0000 mg | ORAL_TABLET | Freq: Every day | ORAL | 0 refills | Status: AC
  Filled 2024-11-26: qty 90, 90d supply, fill #0

## 2024-11-26 MED ORDER — CITALOPRAM HYDROBROMIDE 40 MG PO TABS: 40.0000 mg | ORAL_TABLET | Freq: Every day | ORAL | 0 refills | Status: AC

## 2024-11-26 NOTE — Telephone Encounter (Signed)
 Copied from CRM #[id number]. Topic: Appointments - Scheduling Inquiry for Clinic >> Nov 26, 2024 10:33 AM Harlene ORN wrote: Reason for CRM: Patient was recommended by her Psychiatrist, Dr. Lavetta, to establish care with Dr. Allena Hamilton. If she is able to still be seen by this doctor, please call back the patient to discuss.

## 2024-11-26 NOTE — Telephone Encounter (Unsigned)
 Copied from CRM #[id number]. Topic: Appointments - Scheduling Inquiry for Clinic >> Nov 26, 2024 10:33 AM Harlene ORN wrote: Reason for CRM: Patient was recommended by her Psychiatrist, Dr. Lavetta, to establish care with Dr. Allena Hamilton. If she is able to still be seen by this doctor, please call back the patient to discuss. >> Nov 26, 2024  4:17 PM Drema MATSU wrote: Patient called to follow up on request.

## 2024-12-27 ENCOUNTER — Other Ambulatory Visit: Payer: Self-pay

## 2025-01-06 ENCOUNTER — Other Ambulatory Visit: Payer: Self-pay

## 2025-01-06 MED ORDER — AMPHETAMINE-DEXTROAMPHET ER 20 MG PO CP24: 20.0000 mg | ORAL_CAPSULE | Freq: Every day | ORAL | 0 refills | Status: AC

## 2025-01-06 MED ORDER — LAMOTRIGINE 150 MG PO TABS
150.0000 mg | ORAL_TABLET | Freq: Every day | ORAL | 0 refills | Status: AC
  Filled 2025-01-06: qty 90, 90d supply, fill #0

## 2025-01-06 MED ORDER — CITALOPRAM HYDROBROMIDE 40 MG PO TABS
40.0000 mg | ORAL_TABLET | Freq: Every day | ORAL | 0 refills | Status: AC
  Filled 2025-01-06: qty 90, 90d supply, fill #0

## 2025-01-09 ENCOUNTER — Other Ambulatory Visit: Payer: Self-pay

## 2025-03-04 ENCOUNTER — Ambulatory Visit: Admitting: Internal Medicine
